# Patient Record
Sex: Male | Born: 1960 | Race: White | Hispanic: No | Smoking: Former smoker
Health system: Southern US, Community
[De-identification: ages and names within clinical notes are randomized; demographics above are authoritative.]

## PROBLEM LIST (undated history)

## (undated) DIAGNOSIS — J449 Chronic obstructive pulmonary disease, unspecified: Secondary | ICD-10-CM

## (undated) DIAGNOSIS — I2510 Atherosclerotic heart disease of native coronary artery without angina pectoris: Secondary | ICD-10-CM

## (undated) DIAGNOSIS — I739 Peripheral vascular disease, unspecified: Secondary | ICD-10-CM

## (undated) DIAGNOSIS — I2119 ST elevation (STEMI) myocardial infarction involving other coronary artery of inferior wall: Secondary | ICD-10-CM

## (undated) DIAGNOSIS — I951 Orthostatic hypotension: Secondary | ICD-10-CM

## (undated) DIAGNOSIS — E785 Hyperlipidemia, unspecified: Secondary | ICD-10-CM

## (undated) DIAGNOSIS — R55 Syncope and collapse: Secondary | ICD-10-CM

## (undated) DIAGNOSIS — I10 Essential (primary) hypertension: Secondary | ICD-10-CM

## (undated) HISTORY — DX: ST elevation (STEMI) myocardial infarction involving other coronary artery of inferior wall: I21.19

## (undated) HISTORY — DX: Essential (primary) hypertension: I10

## (undated) HISTORY — DX: Hyperlipidemia, unspecified: E78.5

## (undated) HISTORY — PX: OTHER SURGICAL HISTORY: SHX169

## (undated) HISTORY — DX: Atherosclerotic heart disease of native coronary artery without angina pectoris: I25.10

## (undated) HISTORY — PX: ANKLE SURGERY: SHX546

## (undated) HISTORY — DX: Peripheral vascular disease, unspecified: I73.9

## (undated) HISTORY — PX: FOOT SURGERY: SHX648

## (undated) HISTORY — PX: PR VEIN BYPASS GRAFT,AORTO-FEM-POP: 35551

## (undated) HISTORY — PX: CORONARY ANGIOPLASTY: SHX604

## (undated) HISTORY — DX: Orthostatic hypotension: I95.1

## (undated) HISTORY — DX: Syncope and collapse: R55

## (undated) HISTORY — DX: Chronic obstructive pulmonary disease, unspecified: J44.9

---

## 2007-03-29 ENCOUNTER — Emergency Department (HOSPITAL_COMMUNITY): Admission: EM | Admit: 2007-03-29 | Discharge: 2007-03-29 | Payer: Self-pay | Admitting: *Deleted

## 2008-04-28 HISTORY — PX: CORONARY ARTERY BYPASS GRAFT: SHX141

## 2009-02-12 ENCOUNTER — Ambulatory Visit: Payer: Self-pay | Admitting: Cardiothoracic Surgery

## 2009-02-12 ENCOUNTER — Encounter: Payer: Self-pay | Admitting: Cardiothoracic Surgery

## 2009-02-12 ENCOUNTER — Inpatient Hospital Stay (HOSPITAL_COMMUNITY): Admission: EM | Admit: 2009-02-12 | Discharge: 2009-02-20 | Payer: Self-pay | Admitting: Emergency Medicine

## 2009-03-15 ENCOUNTER — Encounter: Admission: RE | Admit: 2009-03-15 | Discharge: 2009-03-15 | Payer: Self-pay | Admitting: Cardiothoracic Surgery

## 2009-03-15 ENCOUNTER — Ambulatory Visit: Payer: Self-pay | Admitting: Cardiothoracic Surgery

## 2009-04-28 DIAGNOSIS — I2119 ST elevation (STEMI) myocardial infarction involving other coronary artery of inferior wall: Secondary | ICD-10-CM

## 2009-04-28 HISTORY — DX: ST elevation (STEMI) myocardial infarction involving other coronary artery of inferior wall: I21.19

## 2009-05-10 ENCOUNTER — Ambulatory Visit: Payer: Self-pay | Admitting: Cardiovascular Disease

## 2009-05-10 ENCOUNTER — Inpatient Hospital Stay (HOSPITAL_COMMUNITY): Admission: EM | Admit: 2009-05-10 | Discharge: 2009-05-14 | Payer: Self-pay | Admitting: Emergency Medicine

## 2009-10-18 ENCOUNTER — Inpatient Hospital Stay (HOSPITAL_COMMUNITY): Admission: EM | Admit: 2009-10-18 | Discharge: 2009-10-19 | Payer: Self-pay | Admitting: Emergency Medicine

## 2009-10-24 ENCOUNTER — Ambulatory Visit: Payer: Self-pay | Admitting: Vascular Surgery

## 2009-10-26 ENCOUNTER — Ambulatory Visit (HOSPITAL_COMMUNITY): Admission: RE | Admit: 2009-10-26 | Discharge: 2009-10-26 | Payer: Self-pay | Admitting: Vascular Surgery

## 2009-10-26 ENCOUNTER — Ambulatory Visit: Payer: Self-pay | Admitting: Vascular Surgery

## 2009-12-05 ENCOUNTER — Ambulatory Visit: Payer: Self-pay | Admitting: Cardiovascular Disease

## 2010-03-13 ENCOUNTER — Telehealth (INDEPENDENT_AMBULATORY_CARE_PROVIDER_SITE_OTHER): Payer: Self-pay | Admitting: *Deleted

## 2010-03-29 ENCOUNTER — Ambulatory Visit (HOSPITAL_COMMUNITY)
Admission: RE | Admit: 2010-03-29 | Discharge: 2010-03-30 | Payer: Self-pay | Source: Home / Self Care | Admitting: Orthopedic Surgery

## 2010-03-29 ENCOUNTER — Ambulatory Visit (HOSPITAL_BASED_OUTPATIENT_CLINIC_OR_DEPARTMENT_OTHER): Admission: RE | Admit: 2010-03-29 | Payer: Self-pay | Admitting: Orthopedic Surgery

## 2010-05-19 IMAGING — CR DG CHEST 2V
2 series · 2 of 2 positions shown · non-contrast
Comparison: 02/17/2009

CLINICAL DATA: Post CABG/short of breath

CHEST - 2 VIEW

[view not recorded (1 of 2)]
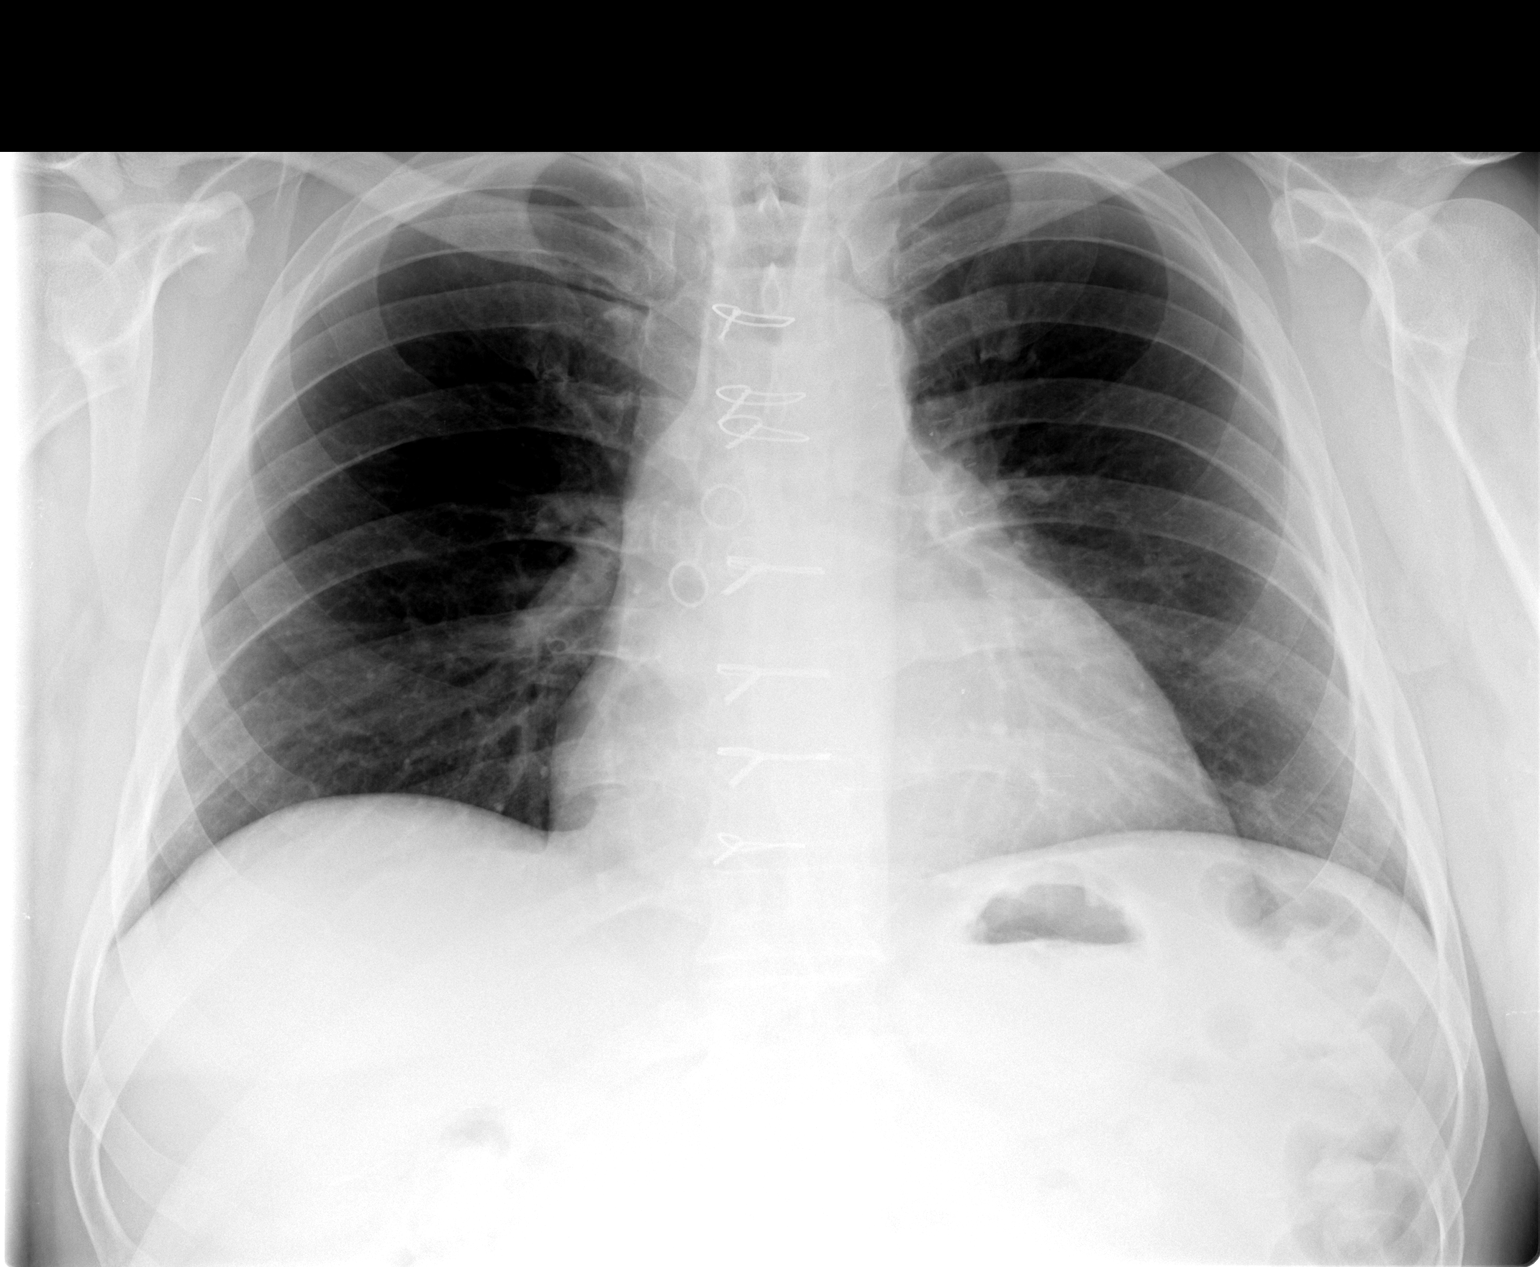

[view not recorded (2 of 2)]
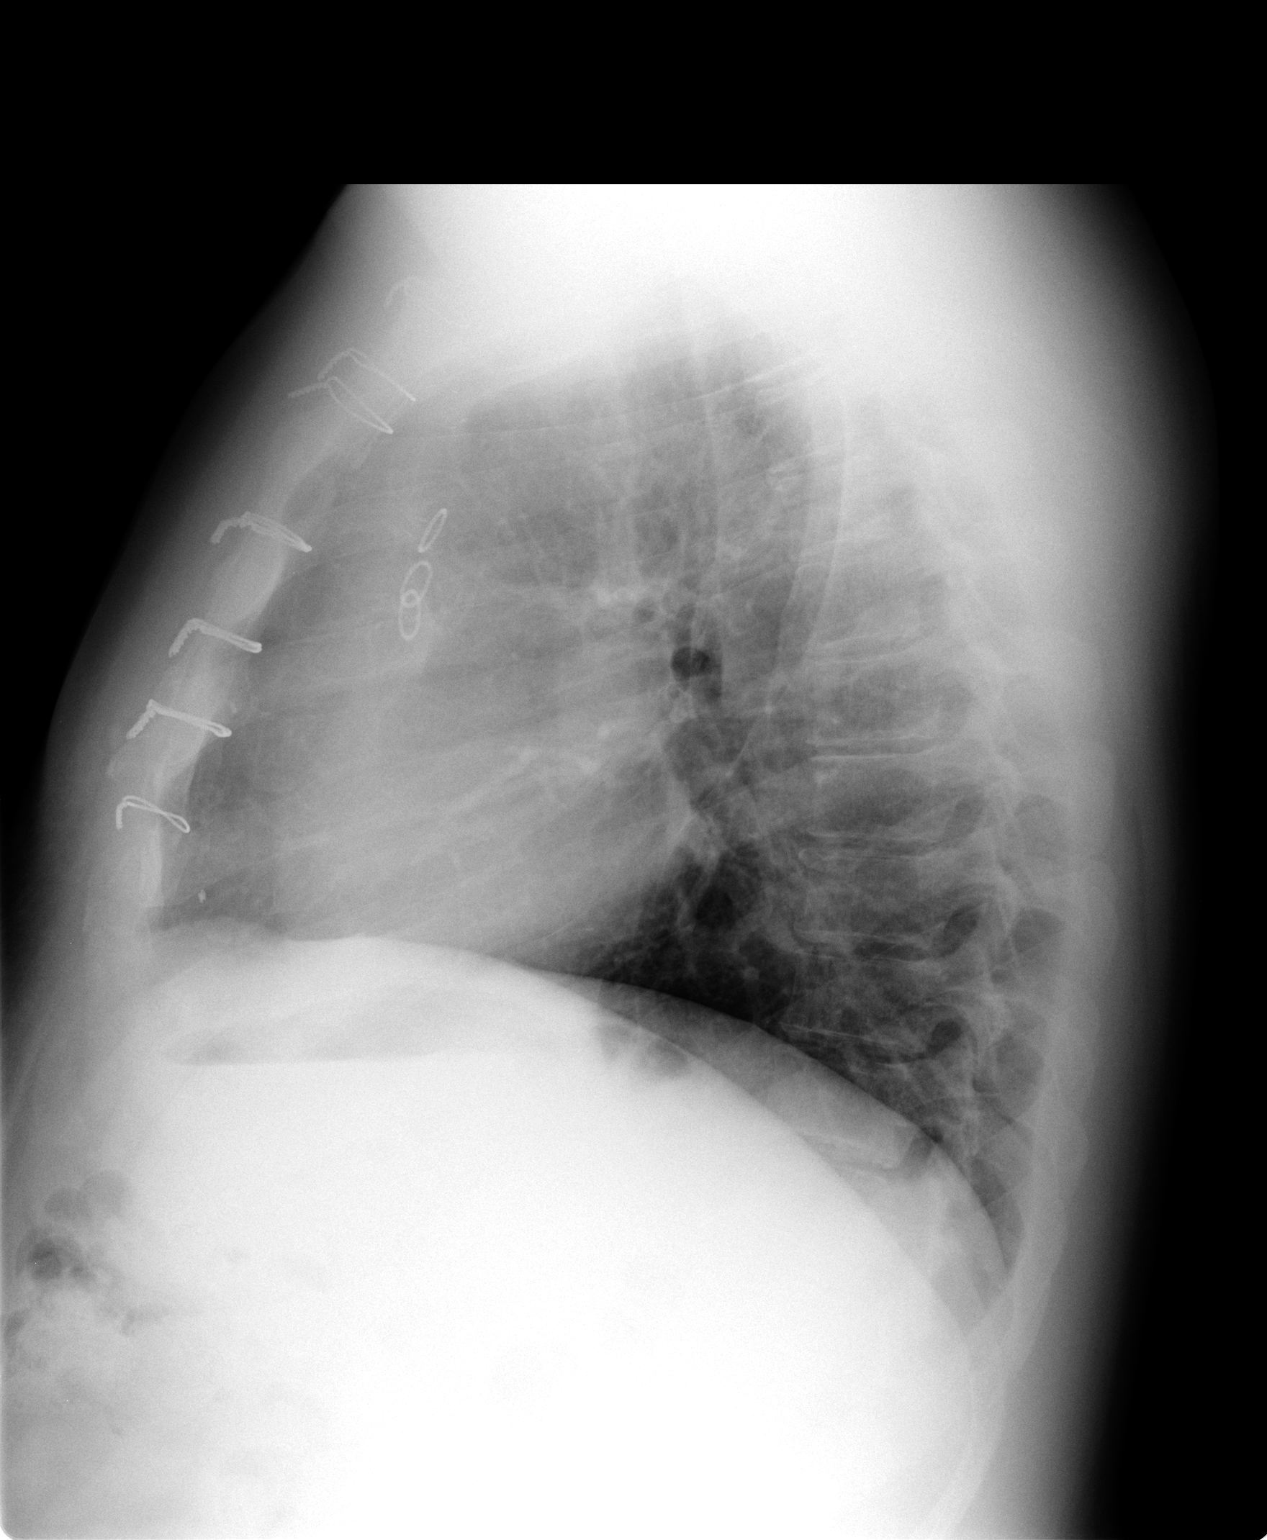

[2 of 2 positions shown; findings below may reference images not displayed]

FINDINGS: Heart size upper normal.  No current congestive heart
failure, pleural fluid, or active airspace disease.  Osseous
structures intact.
IMPRESSION: Post CABG - heart size upper normal.  No active disease.

## 2010-05-30 NOTE — Progress Notes (Signed)
Summary: Records Request  Faxed Discharge Summary & Cath. to Harriett Rush at [facility] ([phone]). Debby Freiberg  March 13, 2010 4:27 PM

## 2010-07-09 LAB — CBC
HCT: 46.7 % (ref 39.0–52.0)
Hemoglobin: 15.7 g/dL (ref 13.0–17.0)
MCH: 28.8 pg (ref 26.0–34.0)
MCHC: 33.6 g/dL (ref 30.0–36.0)
MCV: 85.7 fL (ref 78.0–100.0)
Platelets: 194 10*3/uL (ref 150–400)
RBC: 5.45 MIL/uL (ref 4.22–5.81)
RDW: 13.6 % (ref 11.5–15.5)
WBC: 8.1 10*3/uL (ref 4.0–10.5)

## 2010-07-09 LAB — BASIC METABOLIC PANEL
BUN: 8 mg/dL (ref 6–23)
CO2: 30 mEq/L (ref 19–32)
Calcium: 10 mg/dL (ref 8.4–10.5)
Chloride: 104 mEq/L (ref 96–112)
Creatinine, Ser: 1.26 mg/dL (ref 0.4–1.5)
GFR calc Af Amer: >60 mL/min (ref >60)
GFR calc non Af Amer: >60 mL/min (ref >60)
Glucose, Bld: 103 mg/dL — ABNORMAL HIGH (ref 70–99)
Potassium: 5.3 mEq/L — ABNORMAL HIGH (ref 3.5–5.1)
Sodium: 146 mEq/L — ABNORMAL HIGH (ref 135–145)

## 2010-07-09 LAB — SURGICAL PCR SCREEN
MRSA, PCR: NEGATIVE
Staphylococcus aureus: NEGATIVE

## 2010-07-14 LAB — COMPREHENSIVE METABOLIC PANEL
ALT: 29 U/L (ref 0–53)
ALT: 20 U/L (ref 0–53)
AST: 32 U/L (ref 0–37)
AST: 26 U/L (ref 0–37)
Albumin: 4.3 g/dL (ref 3.5–5.2)
Albumin: 4 g/dL (ref 3.5–5.2)
Alkaline Phosphatase: 73 U/L (ref 39–117)
Alkaline Phosphatase: 70 U/L (ref 39–117)
BUN: 19 mg/dL (ref 6–23)
BUN: 21 mg/dL (ref 6–23)
CO2: 25 mEq/L (ref 19–32)
CO2: 24 mEq/L (ref 19–32)
Calcium: 9.4 mg/dL (ref 8.4–10.5)
Calcium: 9 mg/dL (ref 8.4–10.5)
Chloride: 101 mEq/L (ref 96–112)
Chloride: 108 mEq/L (ref 96–112)
Creatinine, Ser: 1.27 mg/dL (ref 0.4–1.5)
Creatinine, Ser: 1.39 mg/dL (ref 0.4–1.5)
GFR calc Af Amer: >60 mL/min (ref >60)
GFR calc Af Amer: >60 mL/min (ref >60)
GFR calc non Af Amer: >60 mL/min (ref >60)
GFR calc non Af Amer: 55 mL/min — ABNORMAL LOW (ref >60)
Glucose, Bld: 167 mg/dL — ABNORMAL HIGH (ref 70–99)
Glucose, Bld: 102 mg/dL — ABNORMAL HIGH (ref 70–99)
Potassium: 3.9 mEq/L (ref 3.5–5.1)
Potassium: 3.9 mEq/L (ref 3.5–5.1)
Sodium: 137 mEq/L (ref 135–145)
Sodium: 140 mEq/L (ref 135–145)
Total Bilirubin: 0.5 mg/dL (ref 0.3–1.2)
Total Bilirubin: 0.7 mg/dL (ref 0.3–1.2)
Total Protein: 7.5 g/dL (ref 6.0–8.3)
Total Protein: 6.7 g/dL (ref 6.0–8.3)

## 2010-07-14 LAB — CBC
HCT: 37.5 % — ABNORMAL LOW (ref 39.0–52.0)
HCT: 46 % (ref 39.0–52.0)
HCT: 41.3 % (ref 39.0–52.0)
HCT: 39.1 % (ref 39.0–52.0)
HCT: 39.9 % (ref 39.0–52.0)
HCT: 43.7 % (ref 39.0–52.0)
Hemoglobin: 13 g/dL (ref 13.0–17.0)
Hemoglobin: 15.6 g/dL (ref 13.0–17.0)
Hemoglobin: 14.2 g/dL (ref 13.0–17.0)
Hemoglobin: 13.2 g/dL (ref 13.0–17.0)
Hemoglobin: 13.6 g/dL (ref 13.0–17.0)
Hemoglobin: 14.7 g/dL (ref 13.0–17.0)
MCH: 29 pg (ref 26.0–34.0)
MCH: 29.4 pg (ref 26.0–34.0)
MCH: 29.5 pg (ref 26.0–34.0)
MCHC: 33.8 g/dL (ref 30.0–36.0)
MCHC: 34.7 g/dL (ref 30.0–36.0)
MCHC: 34.5 g/dL (ref 30.0–36.0)
MCHC: 33.5 g/dL (ref 30.0–36.0)
MCHC: 33.8 g/dL (ref 30.0–36.0)
MCHC: 33.9 g/dL (ref 30.0–36.0)
MCV: 85.6 fL (ref 78.0–100.0)
MCV: 86.3 fL (ref 78.0–100.0)
MCV: 85.3 fL (ref 78.0–100.0)
MCV: 86.3 fL (ref 78.0–100.0)
MCV: 86.9 fL (ref 78.0–100.0)
MCV: 87 fL (ref 78.0–100.0)
Platelets: 159 10*3/uL (ref 150–400)
Platelets: 190 10*3/uL (ref 150–400)
Platelets: 163 10*3/uL (ref 150–400)
Platelets: 176 10*3/uL (ref 150–400)
Platelets: 181 10*3/uL (ref 150–400)
Platelets: 210 10*3/uL (ref 150–400)
RBC: 4.39 MIL/uL (ref 4.22–5.81)
RBC: 5.32 MIL/uL (ref 4.22–5.81)
RBC: 4.84 MIL/uL (ref 4.22–5.81)
RBC: 4.5 MIL/uL (ref 4.22–5.81)
RBC: 4.6 MIL/uL (ref 4.22–5.81)
RBC: 5.07 MIL/uL (ref 4.22–5.81)
RDW: 13.5 % (ref 11.5–15.5)
RDW: 13.8 % (ref 11.5–15.5)
RDW: 13.5 % (ref 11.5–15.5)
RDW: 13.5 % (ref 11.5–15.5)
RDW: 13.7 % (ref 11.5–15.5)
RDW: 13.9 % (ref 11.5–15.5)
WBC: 7.6 10*3/uL (ref 4.0–10.5)
WBC: 9.1 10*3/uL (ref 4.0–10.5)
WBC: 7.1 10*3/uL (ref 4.0–10.5)
WBC: 5.8 10*3/uL (ref 4.0–10.5)
WBC: 6.2 10*3/uL (ref 4.0–10.5)
WBC: 7.8 10*3/uL (ref 4.0–10.5)

## 2010-07-14 LAB — POCT I-STAT, CHEM 8
BUN: 26 mg/dL — ABNORMAL HIGH (ref 6–23)
BUN: 19 mg/dL (ref 6–23)
BUN: 24 mg/dL — ABNORMAL HIGH (ref 6–23)
Calcium, Ion: 0.99 mmol/L — ABNORMAL LOW (ref 1.12–1.32)
Calcium, Ion: 1.14 mmol/L (ref 1.12–1.32)
Calcium, Ion: 1.14 mmol/L (ref 1.12–1.32)
Chloride: 111 mEq/L (ref 96–112)
Chloride: 107 mEq/L (ref 96–112)
Chloride: 106 mEq/L (ref 96–112)
Creatinine, Ser: 1.6 mg/dL — ABNORMAL HIGH (ref 0.4–1.5)
Creatinine, Ser: 1.3 mg/dL (ref 0.4–1.5)
Creatinine, Ser: 1.8 mg/dL — ABNORMAL HIGH (ref 0.4–1.5)
Glucose, Bld: 109 mg/dL — ABNORMAL HIGH (ref 70–99)
Glucose, Bld: 171 mg/dL — ABNORMAL HIGH (ref 70–99)
Glucose, Bld: 136 mg/dL — ABNORMAL HIGH (ref 70–99)
HCT: 44 % (ref 39.0–52.0)
HCT: 48 % (ref 39.0–52.0)
HCT: 47 % (ref 39.0–52.0)
Hemoglobin: 15 g/dL (ref 13.0–17.0)
Hemoglobin: 16.3 g/dL (ref 13.0–17.0)
Hemoglobin: 16 g/dL (ref 13.0–17.0)
Potassium: 4.1 mEq/L (ref 3.5–5.1)
Potassium: 4.1 mEq/L (ref 3.5–5.1)
Potassium: 3.5 mEq/L (ref 3.5–5.1)
Sodium: 139 mEq/L (ref 135–145)
Sodium: 141 mEq/L (ref 135–145)
Sodium: 142 mEq/L (ref 135–145)
TCO2: 18 mmol/L (ref 0–100)
TCO2: 29 mmol/L (ref 0–100)
TCO2: 26 mmol/L (ref 0–100)

## 2010-07-14 LAB — BASIC METABOLIC PANEL
BUN: 13 mg/dL (ref 6–23)
BUN: 11 mg/dL (ref 6–23)
BUN: 13 mg/dL (ref 6–23)
BUN: 20 mg/dL (ref 6–23)
CO2: 26 mEq/L (ref 19–32)
CO2: 25 mEq/L (ref 19–32)
CO2: 28 mEq/L (ref 19–32)
CO2: 26 mEq/L (ref 19–32)
Calcium: 9.5 mg/dL (ref 8.4–10.5)
Calcium: 8.6 mg/dL (ref 8.4–10.5)
Calcium: 9.1 mg/dL (ref 8.4–10.5)
Calcium: 8.9 mg/dL (ref 8.4–10.5)
Chloride: 106 mEq/L (ref 96–112)
Chloride: 103 mEq/L (ref 96–112)
Chloride: 104 mEq/L (ref 96–112)
Chloride: 106 mEq/L (ref 96–112)
Creatinine, Ser: 1.11 mg/dL (ref 0.4–1.5)
Creatinine, Ser: 1.16 mg/dL (ref 0.4–1.5)
Creatinine, Ser: 1.23 mg/dL (ref 0.4–1.5)
Creatinine, Ser: 1.43 mg/dL (ref 0.4–1.5)
GFR calc Af Amer: >60 mL/min (ref >60)
GFR calc Af Amer: >60 mL/min (ref >60)
GFR calc Af Amer: >60 mL/min (ref >60)
GFR calc Af Amer: >60 mL/min (ref >60)
GFR calc non Af Amer: >60 mL/min (ref >60)
GFR calc non Af Amer: >60 mL/min (ref >60)
GFR calc non Af Amer: >60 mL/min (ref >60)
GFR calc non Af Amer: 53 mL/min — ABNORMAL LOW (ref >60)
Glucose, Bld: 122 mg/dL — ABNORMAL HIGH (ref 70–99)
Glucose, Bld: 96 mg/dL (ref 70–99)
Glucose, Bld: 98 mg/dL (ref 70–99)
Glucose, Bld: 102 mg/dL — ABNORMAL HIGH (ref 70–99)
Potassium: 3.8 mEq/L (ref 3.5–5.1)
Potassium: 3.6 mEq/L (ref 3.5–5.1)
Potassium: 3.9 mEq/L (ref 3.5–5.1)
Potassium: 3.9 mEq/L (ref 3.5–5.1)
Sodium: 142 mEq/L (ref 135–145)
Sodium: 138 mEq/L (ref 135–145)
Sodium: 138 mEq/L (ref 135–145)
Sodium: 143 mEq/L (ref 135–145)

## 2010-07-14 LAB — GLUCOSE, CAPILLARY
Glucose-Capillary: 120 mg/dL — ABNORMAL HIGH (ref 70–99)
Glucose-Capillary: 125 mg/dL — ABNORMAL HIGH (ref 70–99)

## 2010-07-14 LAB — DIFFERENTIAL
Basophils Absolute: 0 10*3/uL (ref 0.0–0.1)
Basophils Relative: 0 % (ref 0–1)
Eosinophils Absolute: 0.3 10*3/uL (ref 0.0–0.7)
Eosinophils Relative: 4 % (ref 0–5)
Lymphocytes Relative: 22 % (ref 12–46)
Lymphs Abs: 1.7 10*3/uL (ref 0.7–4.0)
Monocytes Absolute: 0.7 10*3/uL (ref 0.1–1.0)
Monocytes Relative: 9 % (ref 3–12)
Neutro Abs: 4.9 10*3/uL (ref 1.7–7.7)
Neutrophils Relative %: 65 % (ref 43–77)

## 2010-07-14 LAB — BRAIN NATRIURETIC PEPTIDE
Pro B Natriuretic peptide (BNP): 186 pg/mL — ABNORMAL HIGH (ref 0.0–100.0)
Pro B Natriuretic peptide (BNP): 180 pg/mL — ABNORMAL HIGH (ref 0.0–100.0)

## 2010-07-14 LAB — CARDIAC PANEL(CRET KIN+CKTOT+MB+TROPI)
CK, MB: 2.1 ng/mL (ref 0.3–4.0)
CK, MB: 86 ng/mL (ref 0.3–4.0)
Relative Index: 2 (ref 0.0–2.5)
Relative Index: 8.6 — ABNORMAL HIGH (ref 0.0–2.5)
Total CK: 105 U/L (ref 7–232)
Total CK: 998 U/L — ABNORMAL HIGH (ref 7–232)
Troponin I: 0.04 ng/mL (ref 0.00–0.06)
Troponin I: 50.12 ng/mL (ref 0.00–0.06)

## 2010-07-14 LAB — CK TOTAL AND CKMB (NOT AT ARMC)
CK, MB: 114.6 ng/mL (ref 0.3–4.0)
CK, MB: 3.1 ng/mL (ref 0.3–4.0)
Relative Index: 9.8 — ABNORMAL HIGH (ref 0.0–2.5)
Relative Index: 2.1 (ref 0.0–2.5)
Total CK: 1172 U/L — ABNORMAL HIGH (ref 7–232)
Total CK: 149 U/L (ref 7–232)

## 2010-07-14 LAB — PROTIME-INR
INR: 0.93 (ref 0.00–1.49)
INR: 0.97 (ref 0.00–1.49)
INR: 1.03 (ref 0.00–1.49)
Prothrombin Time: 12.4 seconds (ref 11.6–15.2)
Prothrombin Time: 12.8 seconds (ref 11.6–15.2)
Prothrombin Time: 13.4 seconds (ref 11.6–15.2)

## 2010-07-14 LAB — LIPID PANEL
Cholesterol: 161 mg/dL (ref 0–200)
HDL: 30 mg/dL — ABNORMAL LOW (ref >39)
LDL Cholesterol: 109 mg/dL — ABNORMAL HIGH (ref 0–99)
Total CHOL/HDL Ratio: 5.4 RATIO
Triglycerides: 109 mg/dL (ref <150)
VLDL: 22 mg/dL (ref 0–40)

## 2010-07-14 LAB — POCT CARDIAC MARKERS
CKMB, poc: 1.3 ng/mL (ref 1.0–8.0)
CKMB, poc: 2.2 ng/mL (ref 1.0–8.0)
Myoglobin, poc: 56.2 ng/mL (ref 12–200)
Myoglobin, poc: 172 ng/mL (ref 12–200)
Troponin i, poc: <0.05 ng/mL (ref 0.00–0.09)
Troponin i, poc: <0.05 ng/mL (ref 0.00–0.09)

## 2010-07-14 LAB — MAGNESIUM: Magnesium: 2.1 mg/dL (ref 1.5–2.5)

## 2010-07-14 LAB — HEPARIN LEVEL (UNFRACTIONATED)
Heparin Unfractionated: 0.41 IU/mL (ref 0.30–0.70)
Heparin Unfractionated: 0.41 IU/mL (ref 0.30–0.70)

## 2010-07-14 LAB — TSH: TSH: 3.734 u[IU]/mL (ref 0.350–4.500)

## 2010-07-14 LAB — TROPONIN I
Troponin I: 46.08 ng/mL (ref 0.00–0.06)
Troponin I: 0.01 ng/mL (ref 0.00–0.06)

## 2010-07-14 LAB — HEMOGLOBIN A1C
Hgb A1c MFr Bld: 5.5 % (ref 4.6–6.1)
Mean Plasma Glucose: 111 mg/dL

## 2010-07-14 LAB — D-DIMER, QUANTITATIVE: D-Dimer, Quant: 0.3 ug/mL-FEU (ref 0.00–0.48)

## 2010-07-14 LAB — MRSA PCR SCREENING: MRSA by PCR: NEGATIVE

## 2010-07-16 ENCOUNTER — Encounter (HOSPITAL_BASED_OUTPATIENT_CLINIC_OR_DEPARTMENT_OTHER)
Admission: RE | Admit: 2010-07-16 | Discharge: 2010-07-16 | Disposition: A | Discharge status: Home / Self Care | Payer: Worker's Compensation | Source: Ambulatory Visit | Attending: Orthopedic Surgery | Admitting: Orthopedic Surgery

## 2010-07-16 DIAGNOSIS — Z01812 Encounter for preprocedural laboratory examination: Secondary | ICD-10-CM | POA: Insufficient documentation

## 2010-07-16 LAB — BASIC METABOLIC PANEL
BUN: 9 mg/dL (ref 6–23)
CO2: 26 mEq/L (ref 19–32)
Calcium: 8.8 mg/dL (ref 8.4–10.5)
Chloride: 107 mEq/L (ref 96–112)
Creatinine, Ser: 1.15 mg/dL (ref 0.4–1.5)
GFR calc Af Amer: >60 mL/min (ref >60)
GFR calc non Af Amer: >60 mL/min (ref >60)
Glucose, Bld: 122 mg/dL — ABNORMAL HIGH (ref 70–99)
Potassium: 4 mEq/L (ref 3.5–5.1)
Sodium: 142 mEq/L (ref 135–145)

## 2010-07-19 ENCOUNTER — Ambulatory Visit (HOSPITAL_BASED_OUTPATIENT_CLINIC_OR_DEPARTMENT_OTHER)
Admission: RE | Admit: 2010-07-19 | Discharge: 2010-07-20 | Disposition: A | Discharge status: Home / Self Care | Payer: Worker's Compensation | Source: Ambulatory Visit | Attending: Orthopedic Surgery | Admitting: Orthopedic Surgery

## 2010-07-19 DIAGNOSIS — M24873 Other specific joint derangements of unspecified ankle, not elsewhere classified: Secondary | ICD-10-CM | POA: Insufficient documentation

## 2010-07-19 DIAGNOSIS — M6240 Contracture of muscle, unspecified site: Secondary | ICD-10-CM | POA: Insufficient documentation

## 2010-07-19 DIAGNOSIS — M722 Plantar fascial fibromatosis: Secondary | ICD-10-CM | POA: Insufficient documentation

## 2010-07-19 DIAGNOSIS — Z01812 Encounter for preprocedural laboratory examination: Secondary | ICD-10-CM | POA: Insufficient documentation

## 2010-07-19 LAB — POCT HEMOGLOBIN-HEMACUE: Hemoglobin: 14.5 g/dL (ref 13.0–17.0)

## 2010-07-23 NOTE — Op Note (Signed)
NAME:  [name], [name] NO.:  [id number]  MEDICAL RECORD NO.:  [id number]           PATIENT TYPE:  LOCATION:                                 FACILITY:  PHYSICIAN:  Toni Arthurs, MD        DATE OF BIRTH:  01-04-1961  DATE OF PROCEDURE:  07/19/2010 DATE OF DISCHARGE:                              OPERATIVE REPORT   PREOPERATIVE DIAGNOSES: 1. Left recurrent chronic ankle instability. 2. Chronic plantar fasciitis.  POSTOPERATIVE DIAGNOSES: 1. Left recurrent chronic ankle instability. 2. Chronic plantar fasciitis. 3. Left gastrocnemius contracture.  PROCEDURE: 1. Left gastrocnemius recession. 2. Left peroneus longus tenolysis. 3. Left peroneus brevis tenolysis. 4. Left partial calcanectomy (peroneal tubercle). 5. Left lateral ankle ligament reconstruction with allograft     semitendinosus tendon. 6. Intraoperative interpretation of fluoroscopic imaging.  SURGEON:  Toni Arthurs, MD  ANESTHESIA:  General, regional.IV FLUIDS:  See anesthesia record.  ESTIMATED BLOOD LOSS:  Minimal.  TOURNIQUET TIME:  1 hour 50 minutes at 250 mmHg.  COMPLICATIONS:  None apparent.  DISPOSITION:  Extubated awake and stable to recovery.  INDICATIONS FOR PROCEDURE:  The patient is a 50 year old male with past medical history significant for coronary artery disease who sustained an inversion injury to his ankle at work approximately a year ago.  He failed conservative treatment of this and ended up having lateral ankle ligament reconstruction and peroneal tendon repair.  In the postoperative period, he sustained another inversion injury to the left ankle and has had recurrent chronic ankle instability.  He has evidence of peroneal tendon involvement again as well.  He has failed all efforts at conservative treatments including extensive bracing and physical therapy.  He presents now for exploration of his peroneal tendons with possible repair as well as lateral ligament  reconstruction.  He also developed chronic plantar fasciitis over the past year and has a gastrocnemius contracture.  He presents as well for gastrocnemius recession.  He understands the risks and benefits of these procedures including failure to improve bleeding, infection, nerve damage, blood clots, need for additional surgery, recurrence of his instability, amputation, and death.  He elects to proceed.  PROCEDURE IN DETAIL:  After preoperative consent was obtained and the correct operative site was identified, the patient was brought to the operating room and placed supine on the operating table.  General anesthesia was induced.  Preoperative antibiotics were administered. Surgical time-out was taken.  The patient was then turned into the lateral decubitus position with his left side up.  The left lower extremity was prepped and draped in standard sterile fashion.  An examination under anesthesia was carried out.  The patient was noted to have grade 1 anterior drawer in neutral and in plantar flexion as well as a grade 1 talar tilt test.  He was also found to have a gastrocnemius contracture and just reached neutral dorsiflexion with his knee extended and had approximately 30 degrees of dorsiflexion of his knee flexed. The extremity was exsanguinated and the tourniquet was inflated to 250 mmHg.  A longitudinal incision was marked in the midline of posterior aspect of his calf just  distal to the gastrocnemius.  Sharp dissection was carried down through the skin.  Blunt dissection was carried down to the level of the superficial fascia.  The sural nerve was identified and was retracted and protected through the case.  The superficial fascia was incised.  The gastrocnemius tendon was isolated and divided under direct vision.  At this point, the ankle would easily dorsiflex to 30 degrees with the knee extended.  The wound was irrigated copiously. Inverted simple sutures of 2-0 Vicryl were  used to close the subcutaneous tissue and a running 3-0 Prolene suture was used to close the skin.  Attention was then turned to the patient's previous lateral ankle incision.  This incision was made.  Sharp dissection was carried down through the skin.  Blunt dissection was then carried down to the superficial level of the peroneal tendon sheath.  Sheath was incised leaving a cuff of tissue at the posterior aspect of the fibula.  The peroneus longus tendon was noted to have moderate tenosynovitis, but no evidence of tear.  This was debrided sharply.  The peroneus brevis was noted to be involved with extensive tenosynovitis, but no evidence of tear.  Again, this tenosynovitis was debrided sharply.  The peroneal tubercle was noted to be quite prominent.  This was debrided down to the level of the lateral wall of the calcaneus with a rongeur.  The calcaneofibular ligament was noted to be somewhat attenuated. The ATFL was also somewhat attenuated.  The decision was made to proceed with lateral ligament reconstruction.  An allograft semitendinosus tendon was prepared by whip stitching, the ends of the tendon with the 0 FiberWire. A guide pin was inserted through the sinus tarsi into the neck of the talus.  A 6-mm drill hole was made through the neck of the talus.  A tunnel was drilled in the distal end of the fibula with a 6-mm drill bit from the origin of the ATFL to the origin of CFL in a curved fashion creating a bone bridge at the distal aspect of the fibula.  This was smoothed with a rongeur.  The graft was then passed through the tunnel. A beef pin was used to pass the suture end down through the talar neck tunnel and out through the medial aspect of the ankle.  The tendon was pulled into the tunnel and secured with a 5.5 mm Bio-Tenodesis screw. The ankle was then positioned in maximal eversion.  The graft was tensioned maximally.  A beef pin was then inserted through the lateral wall  of the calcaneus.  Fluoroscopic images were obtained prior to drilling the talar neck hole and were again obtained prior to drilling the calcaneal hole confirming appropriate position of the guide pin in both instances.  The 5.5-mm reamer was then used to drill the hole in the calcaneus.  The graft was pulled down into the tunnel using the beef pin.  The graft was again tensioned maximally while holding the foot maximal eversion.  A 5.5 mm Bio-Tenodesis screw was inserted and noted to have excellent purchase.  At this point, the anterior drawer was noted to be grade 0 in neutral and in plantar flexion.  The wound was irrigated copiously.  The peroneal tendon sheath was repaired with imbricating sutures of 2-0 FiberWire.  The subcutaneous tissues were closed with inverted simple sutures of 2-0 Vicryl.  The skin was closed with horizontal mattress sutures of 3-0 Prolene.  Sterile dressings were applied followed by a well-padded short-leg splint with  the ankle positioned in neutral dorsiflexion and maximal eversion.  The tourniquet was released 1 hour and 50 minutes.  The patient was then awakened from anesthesia and transported to recovery room stable condition.  FOLLOWUP PLAN:  The patient will remain overnight for observation for pain control.  He will be discharged home in the morning.  He will be nonweightbearing on the left lower extremity until he follows up with me in 2 weeks.     Toni Arthurs, MD     JH/MEDQ  D:  07/19/2010  T:  07/20/2010  Job:  [id number]  Electronically Signed by Toni Arthurs  on 07/23/2010 01:38:35 PM

## 2010-07-26 ENCOUNTER — Other Ambulatory Visit: Payer: Self-pay | Admitting: *Deleted

## 2010-07-26 MED ORDER — POTASSIUM CHLORIDE ER 10 MEQ PO TBCR: 10.0000 meq | EXTENDED_RELEASE_TABLET | Freq: Every day | ORAL | Status: DC

## 2010-07-31 ENCOUNTER — Other Ambulatory Visit: Payer: Self-pay | Admitting: Cardiovascular Disease

## 2010-07-31 ENCOUNTER — Other Ambulatory Visit: Payer: Self-pay | Admitting: *Deleted

## 2010-07-31 DIAGNOSIS — I10 Essential (primary) hypertension: Secondary | ICD-10-CM

## 2010-07-31 MED ORDER — POTASSIUM CHLORIDE ER 10 MEQ PO TBCR: 10.0000 meq | EXTENDED_RELEASE_TABLET | Freq: Every day | ORAL | Status: DC

## 2010-07-31 NOTE — Telephone Encounter (Signed)
Re ordered, setting was set to print instead of fax. Alfonso Ramus RN

## 2010-07-31 NOTE — Telephone Encounter (Signed)
REFILL PER FAX FROM PHARMACY  

## 2010-08-01 LAB — CARDIAC PANEL(CRET KIN+CKTOT+MB+TROPI)
CK, MB: 11.2 ng/mL — ABNORMAL HIGH (ref 0.3–4.0)
CK, MB: 35.1 ng/mL — ABNORMAL HIGH (ref 0.3–4.0)
CK, MB: 36.9 ng/mL — ABNORMAL HIGH (ref 0.3–4.0)
Relative Index: 11 — ABNORMAL HIGH (ref 0.0–2.5)
Relative Index: 11.3 — ABNORMAL HIGH (ref 0.0–2.5)
Relative Index: 7.5 — ABNORMAL HIGH (ref 0.0–2.5)
Total CK: 150 U/L (ref 7–232)
Total CK: 311 U/L — ABNORMAL HIGH (ref 7–232)
Total CK: 335 U/L — ABNORMAL HIGH (ref 7–232)
Troponin I: 0.45 ng/mL — ABNORMAL HIGH (ref 0.00–0.06)
Troponin I: 2.08 ng/mL (ref 0.00–0.06)

## 2010-08-01 LAB — POCT I-STAT 3, ART BLOOD GAS (G3+)
Acid-Base Excess: 1 mmol/L (ref 0.0–2.0)
Acid-Base Excess: 2 mmol/L (ref 0.0–2.0)
Acid-base deficit: 1 mmol/L (ref 0.0–2.0)
Acid-base deficit: 2 mmol/L (ref 0.0–2.0)
Acid-base deficit: 2 mmol/L (ref 0.0–2.0)
Acid-base deficit: 2 mmol/L (ref 0.0–2.0)
Bicarbonate: 22.2 mEq/L (ref 20.0–24.0)
Bicarbonate: 22.9 meq/L (ref 20.0–24.0)
Bicarbonate: 23.7 mEq/L (ref 20.0–24.0)
Bicarbonate: 24.5 meq/L — ABNORMAL HIGH (ref 20.0–24.0)
Bicarbonate: 26 mEq/L — ABNORMAL HIGH (ref 20.0–24.0)
Bicarbonate: 26.2 meq/L — ABNORMAL HIGH (ref 20.0–24.0)
Bicarbonate: 27.9 meq/L — ABNORMAL HIGH (ref 20.0–24.0)
O2 Saturation: 100 %
O2 Saturation: 100 %
O2 Saturation: 100 %
O2 Saturation: 100 %
O2 Saturation: 94 %
O2 Saturation: 94 %
O2 Saturation: 96 %
Patient temperature: 36.9
Patient temperature: 38.2
Patient temperature: 98.2
TCO2: 23 mmol/L (ref 0–100)
TCO2: 24 mmol/L (ref 0–100)
TCO2: 25 mmol/L (ref 0–100)
TCO2: 26 mmol/L (ref 0–100)
TCO2: 27 mmol/L (ref 0–100)
TCO2: 27 mmol/L (ref 0–100)
TCO2: 29 mmol/L (ref 0–100)
pCO2 arterial: 34.8 mmHg — ABNORMAL LOW (ref 35.0–45.0)
pCO2 arterial: 39.3 mmHg (ref 35.0–45.0)
pCO2 arterial: 41.6 mmHg (ref 35.0–45.0)
pCO2 arterial: 42.2 mmHg (ref 35.0–45.0)
pCO2 arterial: 48 mmHg — ABNORMAL HIGH (ref 35.0–45.0)
pCO2 arterial: 48 mmHg — ABNORMAL HIGH (ref 35.0–45.0)
pCO2 arterial: 48.5 mmHg — ABNORMAL HIGH (ref 35.0–45.0)
pH, Arterial: 7.312 — ABNORMAL LOW (ref 7.350–7.450)
pH, Arterial: 7.341 — ABNORMAL LOW (ref 7.350–7.450)
pH, Arterial: 7.357 (ref 7.350–7.450)
pH, Arterial: 7.366 (ref 7.350–7.450)
pH, Arterial: 7.372 (ref 7.350–7.450)
pH, Arterial: 7.407 (ref 7.350–7.450)
pH, Arterial: 7.425 (ref 7.350–7.450)
pO2, Arterial: 242 mmHg — ABNORMAL HIGH (ref 80.0–100.0)
pO2, Arterial: 252 mmHg — ABNORMAL HIGH (ref 80.0–100.0)
pO2, Arterial: 255 mmHg — ABNORMAL HIGH (ref 80.0–100.0)
pO2, Arterial: 480 mmHg — ABNORMAL HIGH (ref 80.0–100.0)
pO2, Arterial: 77 mmHg — ABNORMAL LOW (ref 80.0–100.0)
pO2, Arterial: 77 mmHg — ABNORMAL LOW (ref 80.0–100.0)
pO2, Arterial: 81 mmHg (ref 80.0–100.0)

## 2010-08-01 LAB — BASIC METABOLIC PANEL
BUN: 10 mg/dL (ref 6–23)
BUN: 15 mg/dL (ref 6–23)
BUN: 18 mg/dL (ref 6–23)
BUN: 20 mg/dL (ref 6–23)
BUN: 22 mg/dL (ref 6–23)
BUN: 23 mg/dL (ref 6–23)
CO2: 21 mEq/L (ref 19–32)
CO2: 24 mEq/L (ref 19–32)
CO2: 25 mEq/L (ref 19–32)
CO2: 27 mEq/L (ref 19–32)
CO2: 27 mEq/L (ref 19–32)
CO2: 27 mEq/L (ref 19–32)
Calcium: 7.4 mg/dL — ABNORMAL LOW (ref 8.4–10.5)
Calcium: 7.4 mg/dL — ABNORMAL LOW (ref 8.4–10.5)
Calcium: 7.4 mg/dL — ABNORMAL LOW (ref 8.4–10.5)
Calcium: 8.2 mg/dL — ABNORMAL LOW (ref 8.4–10.5)
Calcium: 8.3 mg/dL — ABNORMAL LOW (ref 8.4–10.5)
Calcium: 8.4 mg/dL (ref 8.4–10.5)
Chloride: 101 mEq/L (ref 96–112)
Chloride: 103 mEq/L (ref 96–112)
Chloride: 107 mEq/L (ref 96–112)
Chloride: 107 mEq/L (ref 96–112)
Chloride: 108 mEq/L (ref 96–112)
Chloride: 109 mEq/L (ref 96–112)
Creatinine, Ser: 1.02 mg/dL (ref 0.4–1.5)
Creatinine, Ser: 1.03 mg/dL (ref 0.4–1.5)
Creatinine, Ser: 1.05 mg/dL (ref 0.4–1.5)
Creatinine, Ser: 1.09 mg/dL (ref 0.4–1.5)
Creatinine, Ser: 1.12 mg/dL (ref 0.4–1.5)
Creatinine, Ser: 1.55 mg/dL — ABNORMAL HIGH (ref 0.4–1.5)
GFR calc Af Amer: 59 mL/min — ABNORMAL LOW (ref >60)
GFR calc Af Amer: >60 mL/min (ref >60)
GFR calc Af Amer: >60 mL/min (ref >60)
GFR calc Af Amer: >60 mL/min (ref >60)
GFR calc Af Amer: >60 mL/min (ref >60)
GFR calc Af Amer: >60 mL/min (ref >60)
GFR calc non Af Amer: 49 mL/min — ABNORMAL LOW (ref >60)
GFR calc non Af Amer: >60 mL/min (ref >60)
GFR calc non Af Amer: >60 mL/min (ref >60)
GFR calc non Af Amer: >60 mL/min (ref >60)
GFR calc non Af Amer: >60 mL/min (ref >60)
GFR calc non Af Amer: >60 mL/min (ref >60)
Glucose, Bld: 100 mg/dL — ABNORMAL HIGH (ref 70–99)
Glucose, Bld: 112 mg/dL — ABNORMAL HIGH (ref 70–99)
Glucose, Bld: 114 mg/dL — ABNORMAL HIGH (ref 70–99)
Glucose, Bld: 169 mg/dL — ABNORMAL HIGH (ref 70–99)
Glucose, Bld: 98 mg/dL (ref 70–99)
Glucose, Bld: 99 mg/dL (ref 70–99)
Potassium: 3.6 mEq/L (ref 3.5–5.1)
Potassium: 3.6 mEq/L (ref 3.5–5.1)
Potassium: 3.9 mEq/L (ref 3.5–5.1)
Potassium: 4 mEq/L (ref 3.5–5.1)
Potassium: 4 mEq/L (ref 3.5–5.1)
Potassium: 4.1 mEq/L (ref 3.5–5.1)
Sodium: 137 mEq/L (ref 135–145)
Sodium: 137 mEq/L (ref 135–145)
Sodium: 138 mEq/L (ref 135–145)
Sodium: 142 mEq/L (ref 135–145)
Sodium: 142 mEq/L (ref 135–145)
Sodium: 142 mEq/L (ref 135–145)

## 2010-08-01 LAB — POCT I-STAT 4, (NA,K, GLUC, HGB,HCT)
Glucose, Bld: 101 mg/dL — ABNORMAL HIGH (ref 70–99)
Glucose, Bld: 104 mg/dL — ABNORMAL HIGH (ref 70–99)
Glucose, Bld: 107 mg/dL — ABNORMAL HIGH (ref 70–99)
Glucose, Bld: 113 mg/dL — ABNORMAL HIGH (ref 70–99)
Glucose, Bld: 119 mg/dL — ABNORMAL HIGH (ref 70–99)
Glucose, Bld: 136 mg/dL — ABNORMAL HIGH (ref 70–99)
Glucose, Bld: 158 mg/dL — ABNORMAL HIGH (ref 70–99)
Glucose, Bld: 90 mg/dL (ref 70–99)
Glucose, Bld: 93 mg/dL (ref 70–99)
HCT: 30 % — ABNORMAL LOW (ref 39.0–52.0)
HCT: 30 % — ABNORMAL LOW (ref 39.0–52.0)
HCT: 31 % — ABNORMAL LOW (ref 39.0–52.0)
HCT: 31 % — ABNORMAL LOW (ref 39.0–52.0)
HCT: 33 % — ABNORMAL LOW (ref 39.0–52.0)
HCT: 33 % — ABNORMAL LOW (ref 39.0–52.0)
HCT: 39 % (ref 39.0–52.0)
HCT: 41 % (ref 39.0–52.0)
HCT: 42 % (ref 39.0–52.0)
Hemoglobin: 10.2 g/dL — ABNORMAL LOW (ref 13.0–17.0)
Hemoglobin: 10.2 g/dL — ABNORMAL LOW (ref 13.0–17.0)
Hemoglobin: 10.5 g/dL — ABNORMAL LOW (ref 13.0–17.0)
Hemoglobin: 10.5 g/dL — ABNORMAL LOW (ref 13.0–17.0)
Hemoglobin: 11.2 g/dL — ABNORMAL LOW (ref 13.0–17.0)
Hemoglobin: 11.2 g/dL — ABNORMAL LOW (ref 13.0–17.0)
Hemoglobin: 13.3 g/dL (ref 13.0–17.0)
Hemoglobin: 13.9 g/dL (ref 13.0–17.0)
Hemoglobin: 14.3 g/dL (ref 13.0–17.0)
Potassium: 3.6 mEq/L (ref 3.5–5.1)
Potassium: 3.6 mEq/L (ref 3.5–5.1)
Potassium: 3.7 mEq/L (ref 3.5–5.1)
Potassium: 3.7 meq/L (ref 3.5–5.1)
Potassium: 3.8 meq/L (ref 3.5–5.1)
Potassium: 3.9 meq/L (ref 3.5–5.1)
Potassium: 4.4 mEq/L (ref 3.5–5.1)
Potassium: 4.9 mEq/L (ref 3.5–5.1)
Potassium: 4.9 mEq/L (ref 3.5–5.1)
Sodium: 134 meq/L — ABNORMAL LOW (ref 135–145)
Sodium: 135 mEq/L (ref 135–145)
Sodium: 137 mEq/L (ref 135–145)
Sodium: 137 mEq/L (ref 135–145)
Sodium: 137 mEq/L (ref 135–145)
Sodium: 140 mEq/L (ref 135–145)
Sodium: 140 meq/L (ref 135–145)
Sodium: 141 mEq/L (ref 135–145)
Sodium: 142 meq/L (ref 135–145)

## 2010-08-01 LAB — DIFFERENTIAL
Basophils Absolute: 0.1 10*3/uL (ref 0.0–0.1)
Basophils Relative: 1 % (ref 0–1)
Eosinophils Absolute: 0.2 10*3/uL (ref 0.0–0.7)
Eosinophils Relative: 2 % (ref 0–5)
Lymphocytes Relative: 24 % (ref 12–46)
Lymphs Abs: 2.5 10*3/uL (ref 0.7–4.0)
Monocytes Absolute: 0.7 10*3/uL (ref 0.1–1.0)
Monocytes Relative: 7 % (ref 3–12)
Neutro Abs: 6.7 10*3/uL (ref 1.7–7.7)
Neutrophils Relative %: 66 % (ref 43–77)

## 2010-08-01 LAB — CROSSMATCH
ABO/RH(D): A NEG
Antibody Screen: NEGATIVE

## 2010-08-01 LAB — COMPREHENSIVE METABOLIC PANEL WITH GFR
ALT: 15 U/L (ref 0–53)
AST: 35 U/L (ref 0–37)
Albumin: 3.4 g/dL — ABNORMAL LOW (ref 3.5–5.2)
Alkaline Phosphatase: 62 U/L (ref 39–117)
BUN: 7 mg/dL (ref 6–23)
CO2: 25 meq/L (ref 19–32)
Calcium: 8.6 mg/dL (ref 8.4–10.5)
Chloride: 110 meq/L (ref 96–112)
Creatinine, Ser: 1.04 mg/dL (ref 0.4–1.5)
GFR calc non Af Amer: >60 mL/min
Glucose, Bld: 107 mg/dL — ABNORMAL HIGH (ref 70–99)
Potassium: 3.3 meq/L — ABNORMAL LOW (ref 3.5–5.1)
Sodium: 143 meq/L (ref 135–145)
Total Bilirubin: 0.8 mg/dL (ref 0.3–1.2)
Total Protein: 5.9 g/dL — ABNORMAL LOW (ref 6.0–8.3)

## 2010-08-01 LAB — POCT I-STAT, CHEM 8
BUN: 18 mg/dL (ref 6–23)
BUN: 7 mg/dL (ref 6–23)
BUN: 9 mg/dL (ref 6–23)
Calcium, Ion: 1.03 mmol/L — ABNORMAL LOW (ref 1.12–1.32)
Calcium, Ion: 1.06 mmol/L — ABNORMAL LOW (ref 1.12–1.32)
Calcium, Ion: 1.08 mmol/L — ABNORMAL LOW (ref 1.12–1.32)
Chloride: 106 mEq/L (ref 96–112)
Chloride: 106 mEq/L (ref 96–112)
Chloride: 109 meq/L (ref 96–112)
Creatinine, Ser: 1.1 mg/dL (ref 0.4–1.5)
Creatinine, Ser: 1.3 mg/dL (ref 0.4–1.5)
Creatinine, Ser: 1.4 mg/dL (ref 0.4–1.5)
Glucose, Bld: 120 mg/dL — ABNORMAL HIGH (ref 70–99)
Glucose, Bld: 132 mg/dL — ABNORMAL HIGH (ref 70–99)
Glucose, Bld: 156 mg/dL — ABNORMAL HIGH (ref 70–99)
HCT: 39 % (ref 39.0–52.0)
HCT: 42 % (ref 39.0–52.0)
HCT: 53 % — ABNORMAL HIGH (ref 39.0–52.0)
Hemoglobin: 13.3 g/dL (ref 13.0–17.0)
Hemoglobin: 14.3 g/dL (ref 13.0–17.0)
Hemoglobin: 18 g/dL — ABNORMAL HIGH (ref 13.0–17.0)
Potassium: 3.6 mEq/L (ref 3.5–5.1)
Potassium: 4 mEq/L (ref 3.5–5.1)
Potassium: 4.2 meq/L (ref 3.5–5.1)
Sodium: 138 mEq/L (ref 135–145)
Sodium: 140 mEq/L (ref 135–145)
Sodium: 141 meq/L (ref 135–145)
TCO2: 20 mmol/L (ref 0–100)
TCO2: 21 mmol/L (ref 0–100)
TCO2: 26 mmol/L (ref 0–100)

## 2010-08-01 LAB — CBC
HCT: 31 % — ABNORMAL LOW (ref 39.0–52.0)
HCT: 31.9 % — ABNORMAL LOW (ref 39.0–52.0)
HCT: 31.9 % — ABNORMAL LOW (ref 39.0–52.0)
HCT: 32.3 % — ABNORMAL LOW (ref 39.0–52.0)
HCT: 32.5 % — ABNORMAL LOW (ref 39.0–52.0)
HCT: 35.3 % — ABNORMAL LOW (ref 39.0–52.0)
HCT: 37.3 % — ABNORMAL LOW (ref 39.0–52.0)
HCT: 37.9 % — ABNORMAL LOW (ref 39.0–52.0)
HCT: 38.2 % — ABNORMAL LOW (ref 39.0–52.0)
HCT: 39.8 % (ref 39.0–52.0)
HCT: 40.9 % (ref 39.0–52.0)
HCT: 49.3 % (ref 39.0–52.0)
Hemoglobin: 10.8 g/dL — ABNORMAL LOW (ref 13.0–17.0)
Hemoglobin: 11 g/dL — ABNORMAL LOW (ref 13.0–17.0)
Hemoglobin: 11.1 g/dL — ABNORMAL LOW (ref 13.0–17.0)
Hemoglobin: 11.2 g/dL — ABNORMAL LOW (ref 13.0–17.0)
Hemoglobin: 11.2 g/dL — ABNORMAL LOW (ref 13.0–17.0)
Hemoglobin: 12.2 g/dL — ABNORMAL LOW (ref 13.0–17.0)
Hemoglobin: 12.8 g/dL — ABNORMAL LOW (ref 13.0–17.0)
Hemoglobin: 13.1 g/dL (ref 13.0–17.0)
Hemoglobin: 13.3 g/dL (ref 13.0–17.0)
Hemoglobin: 13.8 g/dL (ref 13.0–17.0)
Hemoglobin: 14.1 g/dL (ref 13.0–17.0)
Hemoglobin: 17 g/dL (ref 13.0–17.0)
MCHC: 34.2 g/dL (ref 30.0–36.0)
MCHC: 34.4 g/dL (ref 30.0–36.0)
MCHC: 34.4 g/dL (ref 30.0–36.0)
MCHC: 34.4 g/dL (ref 30.0–36.0)
MCHC: 34.4 g/dL (ref 30.0–36.0)
MCHC: 34.5 g/dL (ref 30.0–36.0)
MCHC: 34.6 g/dL (ref 30.0–36.0)
MCHC: 34.6 g/dL (ref 30.0–36.0)
MCHC: 34.6 g/dL (ref 30.0–36.0)
MCHC: 34.8 g/dL (ref 30.0–36.0)
MCHC: 35 g/dL (ref 30.0–36.0)
MCHC: 35 g/dL (ref 30.0–36.0)
MCV: 87.2 fL (ref 78.0–100.0)
MCV: 87.4 fL (ref 78.0–100.0)
MCV: 87.7 fL (ref 78.0–100.0)
MCV: 87.8 fL (ref 78.0–100.0)
MCV: 87.9 fL (ref 78.0–100.0)
MCV: 87.9 fL (ref 78.0–100.0)
MCV: 87.9 fL (ref 78.0–100.0)
MCV: 88.2 fL (ref 78.0–100.0)
MCV: 88.2 fL (ref 78.0–100.0)
MCV: 88.4 fL (ref 78.0–100.0)
MCV: 88.4 fL (ref 78.0–100.0)
MCV: 88.6 fL (ref 78.0–100.0)
Platelets: 107 10*3/uL — ABNORMAL LOW (ref 150–400)
Platelets: 111 10*3/uL — ABNORMAL LOW (ref 150–400)
Platelets: 115 K/uL — ABNORMAL LOW (ref 150–400)
Platelets: 123 10*3/uL — ABNORMAL LOW (ref 150–400)
Platelets: 126 K/uL — ABNORMAL LOW (ref 150–400)
Platelets: 134 10*3/uL — ABNORMAL LOW (ref 150–400)
Platelets: 150 10*3/uL (ref 150–400)
Platelets: 162 10*3/uL (ref 150–400)
Platelets: 186 10*3/uL (ref 150–400)
Platelets: 213 10*3/uL (ref 150–400)
Platelets: 261 10*3/uL (ref 150–400)
Platelets: 291 10*3/uL (ref 150–400)
RBC: 3.53 MIL/uL — ABNORMAL LOW (ref 4.22–5.81)
RBC: 3.6 MIL/uL — ABNORMAL LOW (ref 4.22–5.81)
RBC: 3.64 MIL/uL — ABNORMAL LOW (ref 4.22–5.81)
RBC: 3.67 MIL/uL — ABNORMAL LOW (ref 4.22–5.81)
RBC: 3.68 MIL/uL — ABNORMAL LOW (ref 4.22–5.81)
RBC: 4 MIL/uL — ABNORMAL LOW (ref 4.22–5.81)
RBC: 4.25 MIL/uL (ref 4.22–5.81)
RBC: 4.33 MIL/uL (ref 4.22–5.81)
RBC: 4.34 MIL/uL (ref 4.22–5.81)
RBC: 4.53 MIL/uL (ref 4.22–5.81)
RBC: 4.68 MIL/uL (ref 4.22–5.81)
RBC: 5.63 MIL/uL (ref 4.22–5.81)
RDW: 13.9 % (ref 11.5–15.5)
RDW: 13.9 % (ref 11.5–15.5)
RDW: 14.1 % (ref 11.5–15.5)
RDW: 14.1 % (ref 11.5–15.5)
RDW: 14.1 % (ref 11.5–15.5)
RDW: 14.1 % (ref 11.5–15.5)
RDW: 14.1 % (ref 11.5–15.5)
RDW: 14.3 % (ref 11.5–15.5)
RDW: 14.3 % (ref 11.5–15.5)
RDW: 14.3 % (ref 11.5–15.5)
RDW: 14.3 % (ref 11.5–15.5)
RDW: 14.4 % (ref 11.5–15.5)
WBC: 10.1 10*3/uL (ref 4.0–10.5)
WBC: 10.1 10*3/uL (ref 4.0–10.5)
WBC: 13.5 10*3/uL — ABNORMAL HIGH (ref 4.0–10.5)
WBC: 15.5 K/uL — ABNORMAL HIGH (ref 4.0–10.5)
WBC: 17.5 10*3/uL — ABNORMAL HIGH (ref 4.0–10.5)
WBC: 17.5 10*3/uL — ABNORMAL HIGH (ref 4.0–10.5)
WBC: 19.7 10*3/uL — ABNORMAL HIGH (ref 4.0–10.5)
WBC: 20.2 K/uL — ABNORMAL HIGH (ref 4.0–10.5)
WBC: 7.7 10*3/uL (ref 4.0–10.5)
WBC: 8.3 10*3/uL (ref 4.0–10.5)
WBC: 9.4 10*3/uL (ref 4.0–10.5)
WBC: 9.6 10*3/uL (ref 4.0–10.5)

## 2010-08-01 LAB — CREATININE, SERUM
Creatinine, Ser: 1.26 mg/dL (ref 0.4–1.5)
Creatinine, Ser: 1.48 mg/dL (ref 0.4–1.5)
GFR calc Af Amer: >60 mL/min (ref >60)
GFR calc non Af Amer: 51 mL/min — ABNORMAL LOW
GFR calc non Af Amer: >60 mL/min (ref >60)

## 2010-08-01 LAB — BASIC METABOLIC PANEL WITH GFR
BUN: 19 mg/dL (ref 6–23)
CO2: 24 meq/L (ref 19–32)
Calcium: 7.2 mg/dL — ABNORMAL LOW (ref 8.4–10.5)
Chloride: 102 meq/L (ref 96–112)
Creatinine, Ser: 1.35 mg/dL (ref 0.4–1.5)
GFR calc non Af Amer: 57 mL/min — ABNORMAL LOW
Glucose, Bld: 124 mg/dL — ABNORMAL HIGH (ref 70–99)
Potassium: 3.6 meq/L (ref 3.5–5.1)
Sodium: 132 meq/L — ABNORMAL LOW (ref 135–145)

## 2010-08-01 LAB — BLOOD GAS, ARTERIAL
Acid-Base Excess: 0.1 mmol/L (ref 0.0–2.0)
Bicarbonate: 23.7 mEq/L (ref 20.0–24.0)
FIO2: 0.28 %
O2 Saturation: 97.2 %
Patient temperature: 98.6
TCO2: 24.7 mmol/L (ref 0–100)
pCO2 arterial: 34.4 mmHg — ABNORMAL LOW (ref 35.0–45.0)
pH, Arterial: 7.452 — ABNORMAL HIGH (ref 7.350–7.450)
pO2, Arterial: 78.1 mmHg — ABNORMAL LOW (ref 80.0–100.0)

## 2010-08-01 LAB — PROTIME-INR
INR: 0.9 (ref 0.00–1.49)
INR: 1.01 (ref 0.00–1.49)
INR: 1.28 (ref 0.00–1.49)
Prothrombin Time: 12.1 seconds (ref 11.6–15.2)
Prothrombin Time: 13.2 seconds (ref 11.6–15.2)
Prothrombin Time: 15.9 s — ABNORMAL HIGH (ref 11.6–15.2)

## 2010-08-01 LAB — GLUCOSE, CAPILLARY
Glucose-Capillary: 100 mg/dL — ABNORMAL HIGH (ref 70–99)
Glucose-Capillary: 100 mg/dL — ABNORMAL HIGH (ref 70–99)
Glucose-Capillary: 102 mg/dL — ABNORMAL HIGH (ref 70–99)
Glucose-Capillary: 102 mg/dL — ABNORMAL HIGH (ref 70–99)
Glucose-Capillary: 103 mg/dL — ABNORMAL HIGH (ref 70–99)
Glucose-Capillary: 105 mg/dL — ABNORMAL HIGH (ref 70–99)
Glucose-Capillary: 109 mg/dL — ABNORMAL HIGH (ref 70–99)
Glucose-Capillary: 112 mg/dL — ABNORMAL HIGH (ref 70–99)
Glucose-Capillary: 114 mg/dL — ABNORMAL HIGH (ref 70–99)
Glucose-Capillary: 115 mg/dL — ABNORMAL HIGH (ref 70–99)
Glucose-Capillary: 120 mg/dL — ABNORMAL HIGH (ref 70–99)
Glucose-Capillary: 121 mg/dL — ABNORMAL HIGH (ref 70–99)
Glucose-Capillary: 126 mg/dL — ABNORMAL HIGH (ref 70–99)
Glucose-Capillary: 128 mg/dL — ABNORMAL HIGH (ref 70–99)
Glucose-Capillary: 130 mg/dL — ABNORMAL HIGH (ref 70–99)
Glucose-Capillary: 134 mg/dL — ABNORMAL HIGH (ref 70–99)
Glucose-Capillary: 138 mg/dL — ABNORMAL HIGH (ref 70–99)
Glucose-Capillary: 140 mg/dL — ABNORMAL HIGH (ref 70–99)
Glucose-Capillary: 143 mg/dL — ABNORMAL HIGH (ref 70–99)
Glucose-Capillary: 153 mg/dL — ABNORMAL HIGH (ref 70–99)
Glucose-Capillary: 174 mg/dL — ABNORMAL HIGH (ref 70–99)
Glucose-Capillary: 191 mg/dL — ABNORMAL HIGH (ref 70–99)
Glucose-Capillary: 84 mg/dL (ref 70–99)
Glucose-Capillary: 89 mg/dL (ref 70–99)
Glucose-Capillary: 96 mg/dL (ref 70–99)
Glucose-Capillary: 98 mg/dL (ref 70–99)

## 2010-08-01 LAB — POCT CARDIAC MARKERS
CKMB, poc: 2.1 ng/mL (ref 1.0–8.0)
Myoglobin, poc: 83.8 ng/mL (ref 12–200)
Troponin i, poc: <0.05 ng/mL (ref 0.00–0.09)

## 2010-08-01 LAB — APTT
aPTT: 30 seconds (ref 24–37)
aPTT: 31 s (ref 24–37)

## 2010-08-01 LAB — POCT I-STAT 3, VENOUS BLOOD GAS (G3P V)
Bicarbonate: 25.8 mEq/L — ABNORMAL HIGH (ref 20.0–24.0)
O2 Saturation: 79 %
TCO2: 27 mmol/L (ref 0–100)
pCO2, Ven: 48 mmHg (ref 45.0–50.0)
pH, Ven: 7.338 — ABNORMAL HIGH (ref 7.250–7.300)
pO2, Ven: 47 mmHg — ABNORMAL HIGH (ref 30.0–45.0)

## 2010-08-01 LAB — HEPARIN LEVEL (UNFRACTIONATED): Heparin Unfractionated: 0.22 IU/mL — ABNORMAL LOW (ref 0.30–0.70)

## 2010-08-01 LAB — LIPID PANEL
HDL: 29 mg/dL — ABNORMAL LOW
Total CHOL/HDL Ratio: 7.7 ratio
Triglycerides: 121 mg/dL
VLDL: 24 mg/dL (ref 0–40)

## 2010-08-01 LAB — URINALYSIS, ROUTINE W REFLEX MICROSCOPIC
Bilirubin Urine: NEGATIVE
Glucose, UA: NEGATIVE mg/dL
Hgb urine dipstick: NEGATIVE
Ketones, ur: NEGATIVE mg/dL
Nitrite: NEGATIVE
Protein, ur: NEGATIVE mg/dL
Specific Gravity, Urine: 1.024 (ref 1.005–1.030)
Urobilinogen, UA: 0.2 mg/dL (ref 0.0–1.0)
pH: 7 (ref 5.0–8.0)

## 2010-08-01 LAB — CULTURE, RESPIRATORY W GRAM STAIN

## 2010-08-01 LAB — POCT I-STAT GLUCOSE
Glucose, Bld: 101 mg/dL — ABNORMAL HIGH (ref 70–99)
Operator id: 125961

## 2010-08-01 LAB — CULTURE, RESPIRATORY

## 2010-08-01 LAB — MAGNESIUM
Magnesium: 2.2 mg/dL (ref 1.5–2.5)
Magnesium: 2.3 mg/dL (ref 1.5–2.5)
Magnesium: 2.6 mg/dL — ABNORMAL HIGH (ref 1.5–2.5)

## 2010-08-01 LAB — ABO/RH: ABO/RH(D): A NEG

## 2010-08-01 LAB — HEMOGLOBIN AND HEMATOCRIT, BLOOD
HCT: 30.6 % — ABNORMAL LOW (ref 39.0–52.0)
Hemoglobin: 10.6 g/dL — ABNORMAL LOW (ref 13.0–17.0)

## 2010-08-01 LAB — MRSA PCR SCREENING: MRSA by PCR: NEGATIVE

## 2010-08-01 LAB — PLATELET COUNT: Platelets: 110 10*3/uL — ABNORMAL LOW (ref 150–400)

## 2010-08-07 ENCOUNTER — Telehealth: Payer: Self-pay | Admitting: Cardiovascular Disease

## 2010-08-07 DIAGNOSIS — E785 Hyperlipidemia, unspecified: Secondary | ICD-10-CM

## 2010-08-07 DIAGNOSIS — I2510 Atherosclerotic heart disease of native coronary artery without angina pectoris: Secondary | ICD-10-CM

## 2010-08-07 MED ORDER — CLOPIDOGREL BISULFATE 75 MG PO TABS: 75.0000 mg | ORAL_TABLET | Freq: Every day | ORAL | Status: DC

## 2010-08-07 MED ORDER — ROSUVASTATIN CALCIUM 10 MG PO TABS: 10.0000 mg | ORAL_TABLET | Freq: Every day | ORAL | Status: DC

## 2010-08-07 NOTE — Telephone Encounter (Signed)
WANTS SAMPLES OF PLAVIX AND CRESTOR

## 2010-08-07 NOTE — Telephone Encounter (Signed)
No samples available, to call back in a week. Script was printed and mailed at pt request. Alfonso Ramus RN

## 2010-09-02 ENCOUNTER — Other Ambulatory Visit: Payer: Self-pay | Admitting: Cardiovascular Disease

## 2010-09-02 NOTE — Telephone Encounter (Signed)
Pt called he wants samples of Plavix please call if have any

## 2010-09-02 NOTE — Telephone Encounter (Signed)
Pt called for Plavix samples. Notified pt of samples available also asked for crestor samples 20mg  tabs and he cuts them in half. Samples also given of crestor. Pt to pick them up today 09/02/2010

## 2010-09-10 NOTE — Assessment & Plan Note (Signed)
OFFICE VISIT   [name], Allen RINN  DOB:  20-Nov-1960                                        March 15, 2009  CHART #:  [id number]   On February 13, 2009, the patient had coronary artery bypass grafting x5  with left internal mammary to the LAD, reverse saphenous vein to the  intermediate coronary artery, reverse saphenous vein to the distal  circumflex, sequential reverse saphenous vein graft to the distal right  and posterior descending coronary artery.  Postoperatively, he has made  good progress.  He has had no recurrent symptoms of congestive heart  failure or angina.   In the office today, his blood pressure is elevated slightly 158/98,  pulse is 48, respiratory rate is 18.  His sternum is stable and well  healed.  Lungs are clear.  His lower extremities are without significant  edema.  The vein harvest sites are healing well.   Preoperative, he had been a heavy smoker.  He has now completely stopped  smoking, never got a nicotine patch.   He continues on amiodarone 200 mg twice a day.  I have asked him to  decrease this to once a day and then when he sees Dr. Mariel Sleet discuss  stopping it in the near future.  He is on aspirin 325 once a day.  He is  on Lasix and potassium for 2 additional days, and when prescriptions are  complete he will discontinue it.  He is also on clonidine 0.1 mg twice a  day, Pravachol 40 mg a day, metoprolol 25 mg b.i.d.   Followup chest x-ray shows clear lung fields bilaterally.   Overall, I am very pleased with his progress.  I discussed with him  enrolling in cardiac rehab which he will plans to do.  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  I have not made him a return appointment to see me  but would be glad to see him at his or Dr. Harvie Bridge request at any time.   Sheliah Plane, MD  Electronically Signed   EG/MEDQ  D:  03/15/2009  T:  03/16/2009  Job:  [id number]   cc:   Vesta Mixer, M.D.

## 2010-09-10 NOTE — Assessment & Plan Note (Signed)
OFFICE VISIT   [name], [name]  DOB:  [birth year]                                       10/24/2009  [id number]#:[id number]   The patient is a 50 year old male referred by Dr. Elease Hashimoto for lower  extremity claudication symptoms.  The patient had a cardiac  catheterization by Dr. Elease Hashimoto last week.  He was noted on the cardiac  cath to have an 80% left common iliac artery stenosis and a greater than  90% right common iliac stenosis.  The patient states he has severe  claudication in both lower extremities starting at one block.  Right leg  is worse than left.  He denies any numbness or tingling in his feet.  He  denies any history of diabetes.  He denies any rest pain.  He denies any  previous ulcerations on his feet.   Chronic medical problems include hypertension, elevated cholesterol,  coronary artery disease.  These are all currently stable and followed by  Dr. Elease Hashimoto.   Past medical history is otherwise fairly unremarkable.   SOCIAL HISTORY:  He works as a Geneticist, molecular.  He is a former  smoker, quit in October of 2010.  He does not consume alcohol regularly.  He has two children.  He is married.   Family history is unremarkable.   REVIEW OF SYSTEMS:  A full 12 point review of systems was performed with  the patient today.  Please see intake referral form for details  regarding this.   PHYSICAL EXAM:  Vital signs:  Blood pressure is 148/93 in the right arm,  heart rate 62 and regular.  Temperature is 97.7.  HEENT:  Unremarkable.  Neck:  Has 2+ carotid pulses without bruit.  Chest:  Clear to  auscultation.  Cardiac:  Regular rate and without murmur.  He has a well-  healed sternotomy scar.  Abdomen:  Is slightly obese, soft, nontender,  nondistended.  No masses.  Extremities:  He has an absent right femoral  pulse.  He has a 1+ left femoral pulse.  He has a 1+ left dorsalis pedis  pulse.  He has absent popliteal and pedal pulses in the right leg.  Musculoskeletal:  Exam shows symmetric, nondeformed joints.  Neurological:  Exam shows symmetric upper extremity and lower extremity  motor strength which is 5/5.  Skin:  Has no open ulcers or rashes.   He had bilateral ABIs with segmental pressures today.  He did have  calcification of his vessels.  He had monophasic tibial flow on the  right leg.  He had biphasic flow on the left leg.   In summary, the patient is a 50 year old male with bilateral lower  extremity claudication which is disabling to him.  He has known  bilateral iliac stenoses.  In light of this we have scheduled him for  aortogram, bilateral lower extremity runoff angioplasty and stenting of  both iliac arteries.  We will also assess his lower extremity runoff at  the same time.  Risks, benefits, possible complications and procedure  details were explained to the patient today including but not limited to  bleeding, infection, vessel injury, contrast reaction.  He understands  and agrees to proceed.  This is scheduled for Friday 10/26/2009.     Janetta Hora. Fields, MD  Electronically Signed   CEF/MEDQ  D:  10/24/2009  T:  10/25/2009  Job:  [id number]   cc:   Vesta Mixer, M.D.

## 2010-09-16 ENCOUNTER — Encounter: Payer: Self-pay | Admitting: Cardiovascular Disease

## 2010-09-16 DIAGNOSIS — E785 Hyperlipidemia, unspecified: Secondary | ICD-10-CM | POA: Insufficient documentation

## 2010-09-16 DIAGNOSIS — R079 Chest pain, unspecified: Secondary | ICD-10-CM | POA: Insufficient documentation

## 2010-09-16 DIAGNOSIS — Z87898 Personal history of other specified conditions: Secondary | ICD-10-CM | POA: Insufficient documentation

## 2010-09-16 DIAGNOSIS — I2510 Atherosclerotic heart disease of native coronary artery without angina pectoris: Secondary | ICD-10-CM | POA: Insufficient documentation

## 2010-09-16 DIAGNOSIS — I10 Essential (primary) hypertension: Secondary | ICD-10-CM | POA: Insufficient documentation

## 2010-09-16 DIAGNOSIS — I951 Orthostatic hypotension: Secondary | ICD-10-CM | POA: Insufficient documentation

## 2010-09-16 DIAGNOSIS — R0600 Dyspnea, unspecified: Secondary | ICD-10-CM | POA: Insufficient documentation

## 2010-09-16 DIAGNOSIS — I252 Old myocardial infarction: Secondary | ICD-10-CM | POA: Insufficient documentation

## 2010-09-18 ENCOUNTER — Encounter: Payer: Self-pay | Admitting: Cardiovascular Disease

## 2010-09-18 ENCOUNTER — Ambulatory Visit (INDEPENDENT_AMBULATORY_CARE_PROVIDER_SITE_OTHER): Payer: Medicaid Other | Admitting: Cardiovascular Disease

## 2010-09-18 VITALS — BP 146/84 | HR 60 | Wt 239.0 lb

## 2010-09-18 DIAGNOSIS — R0602 Shortness of breath: Secondary | ICD-10-CM

## 2010-09-18 DIAGNOSIS — R079 Chest pain, unspecified: Secondary | ICD-10-CM

## 2010-09-18 DIAGNOSIS — I2510 Atherosclerotic heart disease of native coronary artery without angina pectoris: Secondary | ICD-10-CM

## 2010-09-18 LAB — HEPATIC FUNCTION PANEL
ALT: 21 U/L (ref 0–53)
AST: 23 U/L (ref 0–37)
Albumin: 4.3 g/dL (ref 3.5–5.2)
Alkaline Phosphatase: 82 U/L (ref 39–117)
Bilirubin, Direct: 0.1 mg/dL (ref 0.0–0.3)
Total Bilirubin: 0.5 mg/dL (ref 0.3–1.2)
Total Protein: 7.4 g/dL (ref 6.0–8.3)

## 2010-09-18 LAB — BASIC METABOLIC PANEL
BUN: 9 mg/dL (ref 6–23)
CO2: 29 mEq/L (ref 19–32)
Calcium: 9.5 mg/dL (ref 8.4–10.5)
Chloride: 107 mEq/L (ref 96–112)
Creatinine, Ser: 1.2 mg/dL (ref 0.4–1.5)
GFR: 67.99 mL/min (ref >60.00)
Glucose, Bld: 105 mg/dL — ABNORMAL HIGH (ref 70–99)
Potassium: 4.6 mEq/L (ref 3.5–5.1)
Sodium: 142 mEq/L (ref 135–145)

## 2010-09-18 LAB — LIPID PANEL
Cholesterol: 183 mg/dL (ref 0–200)
HDL: 45.5 mg/dL (ref >39.00)
LDL Cholesterol: 117 mg/dL — ABNORMAL HIGH (ref 0–99)
Total CHOL/HDL Ratio: 4
Triglycerides: 102 mg/dL (ref 0.0–149.0)
VLDL: 20.4 mg/dL (ref 0.0–40.0)

## 2010-09-18 NOTE — Assessment & Plan Note (Signed)
He has Continued musculoskeletal pain. His symptoms seem to improve after exercise. I've asked him to exercise on a more regular basis. He can continue to take Tylenol or Motrin as needed.

## 2010-09-18 NOTE — Assessment & Plan Note (Signed)
He seems to be very stable from a cardiac standpoint. I like to continue with his same medications.

## 2010-09-18 NOTE — Progress Notes (Signed)
C/O SOB with laying down at HS, spo2 @ rest is 98% when ambulating distance greater than 7ft spo2 was 96% hr up to 90.Alfonso Ramus RN

## 2010-09-18 NOTE — Progress Notes (Signed)
[name] Date of Birth  04-Jul-1960 [facility] [address]     [address] [place], [place]  [address] [phone]  Fax  [phone]  History of Present Illness:  [name] is a middle-age gentleman with a history of coronary artery disease and coronary bypass grafting. He had PTCA and stenting of his right coronary artery several months after his bypass grafting. He has done fairly well but has continued have musculoskeletal chest pain. He's not having any angina but has significant chest pain when he last and if he twists his torso. He's been limited by other orthopedic injuries and has not been exercising. He tends to feel a lot better after he's been exercising.  Current Outpatient Prescriptions on File Prior to Visit  Medication Sig Dispense Refill  . clopidogrel (PLAVIX) 75 MG tablet Take 1 tablet (75 mg total) by mouth daily.  30 tablet  11  . hydrochlorothiazide 25 MG tablet Take 25 mg by mouth daily.        Marland Kitchen lisinopril (PRINIVIL,ZESTRIL) 10 MG tablet Take 10 mg by mouth daily.        . potassium chloride (K-DUR) 10 MEQ tablet Take 1 tablet (10 mEq total) by mouth daily.  30 tablet  11  . rosuvastatin (CRESTOR) 10 MG tablet Take 1 tablet (10 mg total) by mouth at bedtime.  30 tablet  11    No Known Allergies  Past Medical History  Diagnosis Date  . Coronary artery disease     STATUS POST CABG 01/2009  . 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 Dyslipidemia   . Hypertension   . Dyspnea   . Orthostatic hypotension   . Syncope and collapse   . Chest pain     Past Surgical History  Procedure Date  . Cardiac catheterization 10/19/2009    History  Smoking status  . Former Smoker  . Quit date: 01/26/2009  Smokeless tobacco  . Not on file    History  Alcohol Use No    Family History  Problem Relation Age of Onset  . Coronary artery disease Father     Reviw of Systems:  Reviewed in the HPI.  All other systems are  negative.  Physical Exam: BP 146/84  Pulse 60  Wt 239 lb (108.41 kg) The patient is alert and oriented x 3.  The mood and affect are normal.  The skin is warm and dry.  Color is normal.  The HEENT exam reveals that the sclera are nonicteric.  The mucous membranes are moist.  The carotids are 2+ without bruits.  There is no thyromegaly.  There is no JVD.  The lungs are clear.  The chest wall is non tender.  The heart exam reveals a regular rate with a normal S1 and S2.  There are no murmurs, gallops, or rubs.  The PMI is not displaced.   Abdominal exam reveals good bowel sounds.  There is no guarding or rebound.  There is no hepatosplenomegaly or tenderness.  There are no masses.  Exam of the legs reveal no clubbing, cyanosis, or edema.  The legs are without rashes.  The distal pulses are intact.  Cranial nerves II - XII are intact.  Motor and sensory functions are intact.  The gait is normal.  ECG: Sinus bradycardia. His previous inferior wall myocardial infarction. Assessment / Plan:

## 2010-09-19 ENCOUNTER — Other Ambulatory Visit: Payer: Self-pay | Admitting: *Deleted

## 2010-09-19 DIAGNOSIS — E785 Hyperlipidemia, unspecified: Secondary | ICD-10-CM

## 2010-09-19 MED ORDER — ROSUVASTATIN CALCIUM 20 MG PO TABS: 20.0000 mg | ORAL_TABLET | Freq: Every day | ORAL | Status: DC

## 2010-09-19 NOTE — Progress Notes (Signed)
Patient called with lab results. Patient request refill. Pt verbalized understanding. Alfonso Ramus RN

## 2010-09-24 ENCOUNTER — Telehealth: Payer: Self-pay | Admitting: Cardiovascular Disease

## 2010-09-24 DIAGNOSIS — E785 Hyperlipidemia, unspecified: Secondary | ICD-10-CM

## 2010-09-24 MED ORDER — ROSUVASTATIN CALCIUM 20 MG PO TABS: 20.0000 mg | ORAL_TABLET | Freq: Every day | ORAL | Status: DC

## 2010-09-24 NOTE — Telephone Encounter (Signed)
Pt said he called last week and his refill for crestor is not at his pharmacy[facility] he has only a few pills left

## 2010-09-26 ENCOUNTER — Ambulatory Visit: Payer: Worker's Compensation | Admitting: Vascular Surgery

## 2010-10-03 ENCOUNTER — Encounter (INDEPENDENT_AMBULATORY_CARE_PROVIDER_SITE_OTHER): Payer: Medicaid Other

## 2010-10-03 ENCOUNTER — Telehealth: Payer: Self-pay | Admitting: *Deleted

## 2010-10-03 ENCOUNTER — Ambulatory Visit (INDEPENDENT_AMBULATORY_CARE_PROVIDER_SITE_OTHER): Payer: Medicaid Other | Admitting: Vascular Surgery

## 2010-10-03 DIAGNOSIS — I70219 Atherosclerosis of native arteries of extremities with intermittent claudication, unspecified extremity: Secondary | ICD-10-CM

## 2010-10-03 DIAGNOSIS — Z48812 Encounter for surgical aftercare following surgery on the circulatory system: Secondary | ICD-10-CM

## 2010-10-03 NOTE — Telephone Encounter (Signed)
This type of chest pain is typical after CABG.  We will order a CXR ( pa and lat) to evaluate.

## 2010-10-03 NOTE — Telephone Encounter (Signed)
Pt continues with pin sticking type pain in chest. Pain is bilateral chest across nipple area. Has numbness in area and occurs with movement and is very painful. Wants to know if an xray can be ordered. HX: CABG. Pain occurs with movement and lifting items. Please advise.

## 2010-10-03 NOTE — Telephone Encounter (Signed)
Pt called and will pick up order tomorrow.Alfonso Ramus RN

## 2010-10-04 ENCOUNTER — Ambulatory Visit
Admission: RE | Admit: 2010-10-04 | Discharge: 2010-10-04 | Disposition: A | Discharge status: Home / Self Care | Payer: Medicaid Other | Source: Ambulatory Visit | Attending: Cardiovascular Disease | Admitting: Cardiovascular Disease

## 2010-10-04 ENCOUNTER — Telehealth: Payer: Self-pay | Admitting: Cardiovascular Disease

## 2010-10-04 NOTE — Telephone Encounter (Signed)
Fax: LABS

## 2010-10-04 NOTE — Assessment & Plan Note (Signed)
OFFICE VISIT  [name], [name] DOB:  02/04/1961                                       10/03/2010 EAVWU#:[id number]  HISTORY OF PRESENT ILLNESS:  The patient had iliac artery stents placed in 2011.  He returns today for further followup.  He denies any claudication symptoms.  He recently has had a few ankle operations by Dr. Victorino Dike for an injury he sustained at work.  He states that these incisions healed without difficulty.  CHRONIC MEDICAL PROBLEMS:  Remain coronary artery disease, hypertension. These are followed by [facility] and Dr. Elease Hashimoto.  These are currently stable.  PAST SURGICAL HISTORY:  Two foot operations in September and December of 2011 as mentioned above.  SOCIAL HISTORY:  He is married.  He works as a Teacher, English as a foreign language but has had difficulty returning to work recently because of problems with his ankle.  REVIEW OF SYSTEMS:  He has some dyspnea when lying flat.  He also has some reflux symptoms and mild asthma.  He has had some recent weight gain.  He is 5 feet 9 inches, 226 pounds.  PHYSICAL EXAM:  Vital signs:  Blood pressure is 159/114 in the left arm, heart rate 66 and regular, respirations 20.  HEENT:  Unremarkable. Neck:  Has 2+ carotid pulses without bruit.  Chest:  Clear to auscultation.  Cardiac:  Regular rate and rhythm without murmur. Peripheral vascular:  He has 2+ radial, 2+ femoral, 1+ dorsalis pedis and 2+ posterior tibial pulses bilaterally.  Overall the patient appears to be doing well.  He had bilateral ABIs performed today which were 1.14 on the right, 1.18 on the left with patent iliac stents.  He will return for followup in 1 year's time for repeat duplex and ABIs.  He will follow up sooner if he develops recurrent symptoms.  Of note, he stated he was also having some problems with his sternum recently and requested a chest x-ray to evaluate this. However, I advised him that he should contact the cardiac  surgeons at [facility] if he needs further evaluation of this.    Allen Hora. Fields, MD Electronically Signed  CEF/MEDQ  D:  10/04/2010  T:  10/04/2010  Job:  [id number]  cc:   Vesta Mixer, M.D. [facility]

## 2010-10-07 NOTE — Progress Notes (Signed)
Pt called with normal results.

## 2010-10-18 NOTE — Procedures (Unsigned)
AORTA-ILIAC DUPLEX EVALUATION  INDICATION:  Bilateral common iliac artery stents.  HISTORY: Diabetes:  No. Cardiac:  No. Hypertension:  Yes. Smoking:  Previous. Previous Surgery:  Bilateral common iliac artery stents on 10/26/2009.              SINGLE LEVEL ARTERIAL EXAM                             RIGHT                  LEFT Brachial:                  151                    159 Anterior tibial:           174                    171 Posterior tibial:          182                    187 Peroneal: Ankle/brachial index:      1.14                   1.18 Previous ABI/date:         10/24/2009 0.79        10/24/2009 1.05  AORTA-ILIAC DUPLEX EXAM Aorta - Proximal     99 cm/s Aorta - Mid          73 cm/s Aorta - Distal       84 cm/s  RIGHT                                   LEFT 176 cm/s          CIA-PROXIMAL          144 cm/s 225 cm/s          CIA-DISTAL            162 cm/s Not visualized    HYPOGASTRIC           Not visualized 159 cm/s          EIA-PROXIMAL          216 cm/s 172 cm/s          EIA-MID               205 cm/s 159 cm/s          EIA-DISTAL            139 cm/s  IMPRESSION: 1. Patent bilateral common iliac artery stents with velocities as     described above. 2. The bilateral ankle brachial indices and Doppler waveforms suggests     normal perfusion of the bilateral lower extremities.  Significant     improvement of the right ankle brachial index when compared to the     previous exam on 10/24/2009 with the left ankle brachial index     remaining stable.  ___________________________________________ Janetta Hora. Fields, MD  CH/MEDQ  D:  10/04/2010  T:  10/04/2010  Job:  [id number]

## 2010-10-20 ENCOUNTER — Ambulatory Visit (HOSPITAL_BASED_OUTPATIENT_CLINIC_OR_DEPARTMENT_OTHER): Payer: Medicaid Other | Attending: Cardiovascular Disease

## 2010-10-20 DIAGNOSIS — G4730 Sleep apnea, unspecified: Secondary | ICD-10-CM | POA: Insufficient documentation

## 2010-10-20 DIAGNOSIS — G4710 Hypersomnia, unspecified: Secondary | ICD-10-CM | POA: Insufficient documentation

## 2010-10-24 ENCOUNTER — Telehealth: Payer: Self-pay | Admitting: Cardiovascular Disease

## 2010-10-24 NOTE — Telephone Encounter (Signed)
I called [facility] sleep lab, study not scored yet. Pt informed.Alfonso Ramus RN

## 2010-10-24 NOTE — Telephone Encounter (Signed)
Wants RN to call him back regarding his sleep study results

## 2010-10-28 ENCOUNTER — Telehealth: Payer: Self-pay | Admitting: Cardiovascular Disease

## 2010-10-28 NOTE — Telephone Encounter (Signed)
Please call patient regarding his sleep study results...done about 2 wks ago-according to patient

## 2010-10-29 ENCOUNTER — Telehealth: Payer: Self-pay | Admitting: *Deleted

## 2010-10-29 NOTE — Telephone Encounter (Signed)
Sleep lab was holding reports due to them not being signed, they faxed them anyway and pt informed i would have dr Ian Bushman review and will call back this afternoon.

## 2010-10-29 NOTE — Telephone Encounter (Signed)
Normal sleep study results, told pt to f/u with pcp for further pulmonary problems.

## 2010-11-02 DIAGNOSIS — G4730 Sleep apnea, unspecified: Secondary | ICD-10-CM

## 2010-11-02 DIAGNOSIS — G4710 Hypersomnia, unspecified: Secondary | ICD-10-CM

## 2010-11-02 NOTE — Procedures (Signed)
NAME:  [name], PRUSS NO.:  [id number]  MEDICAL RECORD NO.:  [id number]          PATIENT TYPE:  OUT  LOCATION:  SLEEP CENTER                 FACILITY:  [facility]  PHYSICIAN:  Juanice Warburton D. Maple Hudson, MD, FCCP, FACPDATE OF BIRTH:  1961/01/04  DATE OF STUDY:  10/20/2010                           NOCTURNAL POLYSOMNOGRAM  REFERRING PHYSICIAN:  PHILIP NAHSER  INDICATION FOR STUDY:  Hypersomnia with sleep apnea.  EPWORTH SLEEPINESS SCORE:  17/24, BMI 32.4.  Weight 239 pounds, height 72 inches.  MEDICATIONS:  Home medications are charted and reviewed.  SLEEP ARCHITECTURE:  Total sleep time 334 minutes with sleep efficiency 91.6%.  Stage I was 6.3%, stage II 77.8%, stage III absent, REM 15.9% of total sleep time.  Sleep latency 3.5 minutes, REM latency 84.5 minutes, awake after sleep onset 27 minutes, arousal index 15.3.  BEDTIME MEDICATION:  None.  RESPIRATORY DATA:  Apnea-hypopnea index (AHI) 2 per hour.  A total of 11 events was scored including 9 obstructive apneas, 1 central apnea and 1 hypopnea.  All events were associated with non supine sleep position. REM/AHI 6.8 per hour, RDI 10.4 per hour.  There were insufficient numbers of events to qualify for CPAP titration by split protocol on the study.  OXYGEN DATA:  Loud snoring with oxygen desaturation to a nadir of 93% and mean oxygen saturation on room air of 96.9% through the study.  CARDIAC DATA:  Normal sinus rhythm.  MOVEMENT-PARASOMNIA:  No significant movement disturbance.  No bathroom trips.  IMPRESSIONS-RECOMMENDATIONS:  Occasional respiratory event with sleep disturbance, within normal limits, AHI 2 per hour (normal range 0-5 per hour).  Non supine events reflected preferred sleep position.  Loud snoring with oxygen desaturation to a nadir of 93% and a mean oxygen saturation through the study of 96.9% on room air.     Emeril Stille D. Maple Hudson, MD, [facility], FACP Diplomate, Biomedical engineer of Sleep  Medicine Electronically Signed    CDY/MEDQ  D:  11/02/2010 11:20:54  T:  11/02/2010 12:48:42  Job:  [id number]

## 2010-11-12 ENCOUNTER — Encounter: Payer: Self-pay | Admitting: Cardiovascular Disease

## 2010-11-12 ENCOUNTER — Ambulatory Visit (INDEPENDENT_AMBULATORY_CARE_PROVIDER_SITE_OTHER): Payer: Medicaid Other | Admitting: Cardiovascular Disease

## 2010-11-12 VITALS — BP 138/76 | HR 74 | Ht 69.0 in | Wt 245.0 lb

## 2010-11-12 DIAGNOSIS — R252 Cramp and spasm: Secondary | ICD-10-CM

## 2010-11-12 DIAGNOSIS — I2510 Atherosclerotic heart disease of native coronary artery without angina pectoris: Secondary | ICD-10-CM

## 2010-11-12 NOTE — Assessment & Plan Note (Signed)
He has not had any episodes of angina. 

## 2010-11-12 NOTE — Progress Notes (Signed)
[name] Date of Birth  1961/03/12 [facility] / [facility] [address]     [address] [place], [place]  [address] [phone]  Fax  [phone]  History of Present Illness:  Complains of dyspnea.  Stopped smoking last June after coronary artery stenting.  Was found to have iliac disease at that time and later had iliac stents.  Now has return of claudication symptoms.  Evaluation by Dr. Darrick Penna ( [facility]) revealed normal ABI).  Cramping at night.  Current Outpatient Prescriptions on File Prior to Visit  Medication Sig Dispense Refill  . clopidogrel (PLAVIX) 75 MG tablet Take 1 tablet (75 mg total) by mouth daily.  30 tablet  11  . hydrochlorothiazide 25 MG tablet Take 25 mg by mouth daily.        Marland Kitchen lisinopril (PRINIVIL,ZESTRIL) 10 MG tablet Take 10 mg by mouth daily.        . potassium chloride (K-DUR) 10 MEQ tablet Take 1 tablet (10 mEq total) by mouth daily.  30 tablet  11  . rosuvastatin (CRESTOR) 20 MG tablet Take 1 tablet (20 mg total) by mouth at bedtime.  30 tablet  11    No Known Allergies  Past Medical History  Diagnosis Date  . Coronary artery disease     STATUS POST CABG 01/2009  . 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 Dyslipidemia   . Hypertension   . Dyspnea   . Orthostatic hypotension   . Syncope and collapse   . Chest pain     Past Surgical History  Procedure Date  . Cardiac catheterization 10/19/2009    History  Smoking status  . Former Smoker  . Quit date: 01/26/2009  Smokeless tobacco  . Not on file    History  Alcohol Use No    Family History  Problem Relation Age of Onset  . Coronary artery disease Father     Reviw of Systems:  Reviewed in the HPI.  All other systems are negative.  Physical Exam: BP 138/76  Pulse 74  Ht 5\' 9"  (1.753 m)  Wt 245 lb (111.131 kg)  BMI 36.18 kg/m2 The patient is alert and oriented x 3.  The mood and affect are normal.   Skin: warm and dry.  Color is normal.    HEENT:   the  sclera are nonicteric.  The mucous membranes are moist.  The carotids are 2+ without bruits.  There is no thyromegaly.  There is no JVD.    Lungs: clear.  The chest wall is non tender.    Heart: regular rate with a normal S1 and S2.  There are no murmurs, gallops, or rubs. The PMI is not displaced.     Abdomin: good bowel sounds.  There is no guarding or rebound.  There is no hepatosplenomegaly or tenderness.  There are no masses.   Extremities:  no clubbing, cyanosis, or edema.  The legs are without rashes.  The distal pulses are excellant.   Neuro:  Cranial nerves II - XII are intact.  Motor and sensory functions are intact.    The gait is normal.  Assessment / Plan:

## 2010-11-12 NOTE — Assessment & Plan Note (Signed)
[name] has excellent distal pulses. I do not think that his extensor do to a vascular etiology. In addition, he was seen at the vein the vascular specialist last week and his ankle-brachial indexes were normal.  It is possible that he has some small vessel disease although I doubt is the issue. I've suggested that he try taking a small spoon of mustard or drinking a small sip of  pickle juice to help with his cramps. He's to  consult with his general medical doctor for Further solutions.

## 2010-12-20 ENCOUNTER — Other Ambulatory Visit (INDEPENDENT_AMBULATORY_CARE_PROVIDER_SITE_OTHER): Payer: Medicaid Other | Admitting: *Deleted

## 2010-12-20 ENCOUNTER — Other Ambulatory Visit: Payer: Medicaid Other | Admitting: *Deleted

## 2010-12-20 DIAGNOSIS — E785 Hyperlipidemia, unspecified: Secondary | ICD-10-CM

## 2010-12-20 LAB — BASIC METABOLIC PANEL
BUN: 21 mg/dL (ref 6–23)
CO2: 28 mEq/L (ref 19–32)
Calcium: 10.2 mg/dL (ref 8.4–10.5)
Chloride: 106 mEq/L (ref 96–112)
Creatinine, Ser: 1.2 mg/dL (ref 0.4–1.5)
GFR: 66.65 mL/min (ref >60.00)
Glucose, Bld: 95 mg/dL (ref 70–99)
Potassium: 4.6 mEq/L (ref 3.5–5.1)
Sodium: 141 mEq/L (ref 135–145)

## 2010-12-20 LAB — HEPATIC FUNCTION PANEL
ALT: 22 U/L (ref 0–53)
AST: 21 U/L (ref 0–37)
Albumin: 4.2 g/dL (ref 3.5–5.2)
Alkaline Phosphatase: 71 U/L (ref 39–117)
Bilirubin, Direct: 0.2 mg/dL (ref 0.0–0.3)
Total Bilirubin: 0.7 mg/dL (ref 0.3–1.2)
Total Protein: 7 g/dL (ref 6.0–8.3)

## 2010-12-20 LAB — LIPID PANEL
Cholesterol: 167 mg/dL (ref 0–200)
HDL: 46.1 mg/dL (ref >39.00)
LDL Cholesterol: 101 mg/dL — ABNORMAL HIGH (ref 0–99)
Total CHOL/HDL Ratio: 4
Triglycerides: 99 mg/dL (ref 0.0–149.0)
VLDL: 19.8 mg/dL (ref 0.0–40.0)

## 2011-01-30 DIAGNOSIS — G894 Chronic pain syndrome: Secondary | ICD-10-CM | POA: Insufficient documentation

## 2011-01-30 DIAGNOSIS — G8929 Other chronic pain: Secondary | ICD-10-CM | POA: Insufficient documentation

## 2011-01-31 DIAGNOSIS — G629 Polyneuropathy, unspecified: Secondary | ICD-10-CM | POA: Insufficient documentation

## 2011-01-31 DIAGNOSIS — M25579 Pain in unspecified ankle and joints of unspecified foot: Secondary | ICD-10-CM | POA: Insufficient documentation

## 2011-01-31 DIAGNOSIS — M79673 Pain in unspecified foot: Secondary | ICD-10-CM | POA: Insufficient documentation

## 2011-02-03 ENCOUNTER — Ambulatory Visit (INDEPENDENT_AMBULATORY_CARE_PROVIDER_SITE_OTHER): Payer: Medicaid Other | Admitting: Pulmonary Disease

## 2011-02-03 ENCOUNTER — Encounter: Payer: Self-pay | Admitting: Pulmonary Disease

## 2011-02-03 VITALS — BP 116/80 | HR 81 | Temp 97.9°F | Ht 69.0 in | Wt 249.6 lb

## 2011-02-03 DIAGNOSIS — R0989 Other specified symptoms and signs involving the circulatory and respiratory systems: Secondary | ICD-10-CM

## 2011-02-03 DIAGNOSIS — R0600 Dyspnea, unspecified: Secondary | ICD-10-CM

## 2011-02-03 NOTE — Progress Notes (Signed)
  Subjective:    Patient ID: [name], male    DOB: 04-21-1961, 50 y.o.   MRN: [id number]  HPI PCP - Gilmore Laroche, [employer]- nahser 50/M, ex smoker, presents for evaluation of dyspnea that started 8/12 unrelated to activity. He smoked 90 Pyrs before quitting in '10 He has a history of coronary artery disease and coronary bypass grafting '10. He had PTCA and stenting of his right coronary artery several months after his bypass grafting. EF was 50-55% in 2010. He has iliac stents Dyspnea is unrelated to activity, to diurnal or seasonal variation. Does not have congestion or wheezing, or associated chest pain, palpitations. 9/12 lisinopril stopped,  cough better but dyspnea persisted CXR 6/12 wnl  Meds tried -advair, spiriva - no relief. daliresp samples were given PSG mild, predom RERAs,  AHI 2/h, RDI 10/h Spirometry showed severe restriction with FEv1 35%, FVC 32% & ratio 71 No h/o recent surgery - he had ankle surgeyr in the past, no F/h/o blood clots. He denies anxiety, panic attacks or significant stressors.   Review of Systems  Constitutional: Negative for fever, appetite change and unexpected weight change.  HENT: Negative for ear pain, congestion, sore throat, rhinorrhea, sneezing, trouble swallowing, dental problem and postnasal drip.   Eyes: Negative for redness.  Respiratory: Positive for cough and shortness of breath. Negative for wheezing.   Cardiovascular: Negative for chest pain, palpitations and leg swelling.  Gastrointestinal: Negative for nausea, vomiting, abdominal pain and diarrhea.  Genitourinary: Negative for dysuria and urgency.  Musculoskeletal: Negative for joint swelling.  Skin: Negative for rash.  Neurological: Negative for syncope and headaches.  Hematological: Does not bruise/bleed easily.  Psychiatric/Behavioral: Negative for dysphoric mood. The patient is not nervous/anxious.        Objective:   Physical Exam Gen. Pleasant, obese, in no  distress, normal affect ENT - no lesions, no post nasal drip, class 2-3 airway Neck: No JVD, no thyromegaly, no carotid bruits Lungs: no use of accessory muscles, no dullness to percussion, decreased without rales or rhonchi  Cardiovascular: Rhythm regular, heart sounds  normal, no murmurs or gallops, no peripheral edema Abdomen: soft and non-tender, no hepatosplenomegaly, BS normal. Musculoskeletal: No deformities, no cyanosis or clubbing Neuro:  alert, non focal, no tremors        Assessment & Plan:

## 2011-02-03 NOTE — Patient Instructions (Addendum)
CT scan of chest to look for blood clots Full breathing test Trial of albuterol inhaler -sample - 2 puffs to take up to 4 times/ day as needed every 6 hours Continue Spiriva

## 2011-02-05 ENCOUNTER — Other Ambulatory Visit: Payer: Medicaid Other

## 2011-02-05 DIAGNOSIS — R0600 Dyspnea, unspecified: Secondary | ICD-10-CM

## 2011-02-05 LAB — D-DIMER, QUANTITATIVE: D-Dimer, Quant: 0.52 ug/mL-FEU — ABNORMAL HIGH (ref 0.00–0.48)

## 2011-02-05 NOTE — Assessment & Plan Note (Signed)
Unclear cause - spirometry suggests more restriction rather than obstruction. COPD meds did not provide any relief. No evidence of pulmonary hypertension on review of cath data. CT scan of chest to look for blood clots although his only risk factor is ankle surgery. Full PFT Trial of albuterol inhaler -sample - 2 puffs to take up to 4 times/ day as needed every 6 hours Continue Spiriva I discussed pulm rehab with him but he would like to explore the cause for his dyspnea.

## 2011-02-06 ENCOUNTER — Other Ambulatory Visit: Payer: Self-pay | Admitting: Pulmonary Disease

## 2011-02-06 DIAGNOSIS — R0600 Dyspnea, unspecified: Secondary | ICD-10-CM

## 2011-02-06 NOTE — Progress Notes (Signed)
Quick Note:  Test ordered. ______

## 2011-02-10 ENCOUNTER — Ambulatory Visit (INDEPENDENT_AMBULATORY_CARE_PROVIDER_SITE_OTHER)
Admission: RE | Admit: 2011-02-10 | Discharge: 2011-02-10 | Disposition: A | Discharge status: Home / Self Care | Payer: Medicaid Other | Source: Ambulatory Visit | Attending: Pulmonary Disease | Admitting: Pulmonary Disease

## 2011-02-10 DIAGNOSIS — R0609 Other forms of dyspnea: Secondary | ICD-10-CM

## 2011-02-10 DIAGNOSIS — R0600 Dyspnea, unspecified: Secondary | ICD-10-CM

## 2011-02-10 DIAGNOSIS — R0989 Other specified symptoms and signs involving the circulatory and respiratory systems: Secondary | ICD-10-CM

## 2011-02-10 MED ORDER — IOHEXOL 300 MG/ML  SOLN
80.0000 mL | Freq: Once | INTRAMUSCULAR | Status: AC | PRN
  Administered 2011-02-10: 80 mL via INTRAVENOUS

## 2011-02-12 NOTE — Progress Notes (Signed)
Quick Note:  I informed pt's spouse of RA's findings and recommendations. Pt's spouse verbalized understanding.  ______

## 2011-02-18 ENCOUNTER — Ambulatory Visit (INDEPENDENT_AMBULATORY_CARE_PROVIDER_SITE_OTHER): Payer: Medicaid Other | Admitting: Pulmonary Disease

## 2011-02-18 DIAGNOSIS — R0989 Other specified symptoms and signs involving the circulatory and respiratory systems: Secondary | ICD-10-CM

## 2011-02-18 DIAGNOSIS — R0609 Other forms of dyspnea: Secondary | ICD-10-CM

## 2011-02-18 DIAGNOSIS — R0600 Dyspnea, unspecified: Secondary | ICD-10-CM

## 2011-02-18 LAB — PULMONARY FUNCTION TEST

## 2011-02-18 NOTE — Progress Notes (Signed)
PFT done today. 

## 2011-02-23 ENCOUNTER — Telehealth: Payer: Self-pay | Admitting: Pulmonary Disease

## 2011-02-23 NOTE — Telephone Encounter (Signed)
PFTs showed mild reversible obstruction - 16% improvement with albuterol Hope albuterol is helping Stay on spiriva until next visit

## 2011-02-28 NOTE — Telephone Encounter (Signed)
I informed pt of RA's findings and recommendations. Pt verbalized understanding and c/o Albuterol not helping. States "when I can't breathe, I can't breathe". Please advise. Thanks

## 2011-03-01 NOTE — Telephone Encounter (Signed)
Pulm rehab may help with deconditioning OK to refer if he is agreeable No other recommendations.

## 2011-03-03 NOTE — Telephone Encounter (Signed)
lmomtcb x1 

## 2011-03-03 NOTE — Telephone Encounter (Signed)
PT RETURNED CALL. SAME #. [name]

## 2011-03-03 NOTE — Telephone Encounter (Signed)
I informed pt of RA's findings and recommendations. Pt states "that just makes no sense" to him and request to cancel follow up since there is "nothing else he can do for me"; I advised the pt to give [place] a call if needed.

## 2011-03-21 ENCOUNTER — Ambulatory Visit: Payer: Medicaid Other | Admitting: Pulmonary Disease

## 2011-03-25 ENCOUNTER — Other Ambulatory Visit (INDEPENDENT_AMBULATORY_CARE_PROVIDER_SITE_OTHER): Payer: Medicaid Other | Admitting: *Deleted

## 2011-03-25 DIAGNOSIS — I2510 Atherosclerotic heart disease of native coronary artery without angina pectoris: Secondary | ICD-10-CM

## 2011-03-25 LAB — BASIC METABOLIC PANEL
BUN: 17 mg/dL (ref 6–23)
CO2: 24 mEq/L (ref 19–32)
Calcium: 9.5 mg/dL (ref 8.4–10.5)
Chloride: 107 mEq/L (ref 96–112)
Creatinine, Ser: 1.2 mg/dL (ref 0.4–1.5)
GFR: 67.84 mL/min (ref >60.00)
Glucose, Bld: 104 mg/dL — ABNORMAL HIGH (ref 70–99)
Potassium: 3.9 mEq/L (ref 3.5–5.1)
Sodium: 141 mEq/L (ref 135–145)

## 2011-03-25 LAB — LIPID PANEL
Cholesterol: 143 mg/dL (ref 0–200)
HDL: 37.3 mg/dL — ABNORMAL LOW (ref >39.00)
LDL Cholesterol: 92 mg/dL (ref 0–99)
Total CHOL/HDL Ratio: 4
Triglycerides: 69 mg/dL (ref 0.0–149.0)
VLDL: 13.8 mg/dL (ref 0.0–40.0)

## 2011-04-06 DIAGNOSIS — G5780 Other specified mononeuropathies of unspecified lower limb: Secondary | ICD-10-CM | POA: Insufficient documentation

## 2011-04-06 DIAGNOSIS — Z5181 Encounter for therapeutic drug level monitoring: Secondary | ICD-10-CM | POA: Insufficient documentation

## 2011-04-15 ENCOUNTER — Telehealth: Payer: Self-pay | Admitting: Cardiovascular Disease

## 2011-04-15 DIAGNOSIS — E785 Hyperlipidemia, unspecified: Secondary | ICD-10-CM

## 2011-04-15 MED ORDER — ROSUVASTATIN CALCIUM 20 MG PO TABS: 20.0000 mg | ORAL_TABLET | Freq: Every day | ORAL | Status: DC

## 2011-04-15 NOTE — Telephone Encounter (Signed)
New msg Pt said his pharmacy has faxed request for crestor three times with no response Pt said he is out of crestor now Please fax to [facility] in [place]

## 2011-04-15 NOTE — Telephone Encounter (Signed)
Called stating that [facility] had faxed refill request 3 times. Sent in today. Lm that Rx was sent in. Also LM that he needs an appointment in Jan. Per pharmacy needs prior authorization. Ph # [phone]; ID # [id number]; spoke w/Barbara. Pt has tried Simvastatin,Lipitor but could not get cholesterol down with those meds. Britta Mccreedy approved x 1 yr from today. Called pharmacy back and went thru. Notified pt he could pick up.

## 2011-05-22 ENCOUNTER — Ambulatory Visit: Payer: Medicaid Other | Admitting: Cardiovascular Disease

## 2011-05-23 ENCOUNTER — Emergency Department (HOSPITAL_COMMUNITY)
Admission: EM | Admit: 2011-05-23 | Discharge: 2011-05-23 | Disposition: A | Discharge status: Home / Self Care | Payer: Medicaid Other | Attending: Emergency Medicine | Admitting: Emergency Medicine

## 2011-05-23 ENCOUNTER — Encounter (HOSPITAL_COMMUNITY): Payer: Self-pay

## 2011-05-23 DIAGNOSIS — H5710 Ocular pain, unspecified eye: Secondary | ICD-10-CM | POA: Insufficient documentation

## 2011-05-23 DIAGNOSIS — H5789 Other specified disorders of eye and adnexa: Secondary | ICD-10-CM | POA: Insufficient documentation

## 2011-05-23 DIAGNOSIS — I252 Old myocardial infarction: Secondary | ICD-10-CM | POA: Insufficient documentation

## 2011-05-23 DIAGNOSIS — E785 Hyperlipidemia, unspecified: Secondary | ICD-10-CM | POA: Insufficient documentation

## 2011-05-23 DIAGNOSIS — I2510 Atherosclerotic heart disease of native coronary artery without angina pectoris: Secondary | ICD-10-CM | POA: Insufficient documentation

## 2011-05-23 DIAGNOSIS — Z79899 Other long term (current) drug therapy: Secondary | ICD-10-CM | POA: Insufficient documentation

## 2011-05-23 DIAGNOSIS — I10 Essential (primary) hypertension: Secondary | ICD-10-CM | POA: Insufficient documentation

## 2011-05-23 DIAGNOSIS — T1590XA Foreign body on external eye, part unspecified, unspecified eye, initial encounter: Secondary | ICD-10-CM

## 2011-05-23 MED ORDER — TOBRAMYCIN 0.3 % OP SOLN
OPHTHALMIC | Status: DC
Start: 2011-05-23 — End: 2011-05-23
  Filled 2011-05-23: qty 5

## 2011-05-23 MED ORDER — TETRACAINE HCL 0.5 % OP SOLN
2.0000 [drp] | Freq: Once | OPHTHALMIC | Status: AC
  Administered 2011-05-23: 2 [drp] via OPHTHALMIC

## 2011-05-23 MED ORDER — TETRACAINE HCL 0.5 % OP SOLN
OPHTHALMIC | Status: AC
  Administered 2011-05-23: 2 [drp] via OPHTHALMIC
  Filled 2011-05-23: qty 2

## 2011-05-23 MED ORDER — TOBRAMYCIN 0.3 % OP SOLN
2.0000 [drp] | OPHTHALMIC | Status: DC
  Administered 2011-05-23: 2 [drp] via OPHTHALMIC

## 2011-05-23 MED ORDER — FLUORESCEIN SODIUM 1 MG OP STRP
ORAL_STRIP | OPHTHALMIC | Status: DC
Start: 2011-05-23 — End: 2011-05-23
  Filled 2011-05-23: qty 1

## 2011-05-23 MED ORDER — HYDROCODONE-ACETAMINOPHEN 5-325 MG PO TABS: ORAL_TABLET | ORAL | Status: DC

## 2011-05-23 NOTE — ED Notes (Signed)
Pt presents with possible FB (wood) to right eye. Pt states he was carrying wood in the house and that's is when he started experiencing pain.

## 2011-05-23 NOTE — ED Notes (Signed)
Patient c/o something in right eye since last night after wiping eye with sleeve while bringing in wood, looks as if debris is stuck to cornea of right eye, Beverely Pace, [place] is aware

## 2011-05-23 NOTE — ED Provider Notes (Signed)
Medical screening examination/treatment/procedure(s) were performed by non-physician practitioner and as supervising physician I was immediately available for consultation/collaboration.   Fayette Hamada M Nancie Bocanegra, MD 05/23/11 2140 

## 2011-05-23 NOTE — ED Provider Notes (Addendum)
History     CSN: [id number]  Arrival date & time 05/23/11  1149   First MD Initiated Contact with Patient 05/23/11 1216      Chief Complaint  Patient presents with  . Foreign Body in Eye    (Consider location/radiation/quality/duration/timing/severity/associated sxs/prior treatment) Patient is a 51 y.o. male presenting with foreign body in eye. The history is provided by the patient.  Foreign Body in Eye The current episode started yesterday. The problem occurs constantly. The problem has been gradually worsening. Pertinent negatives include no abdominal pain, anorexia, arthralgias, chest pain, coughing, fever, neck pain or visual change. Exacerbated by: blinking. Treatments tried: flushing eye. The treatment provided no relief.    Past Medical History  Diagnosis Date  . Coronary artery disease     STATUS POST CABG 01/2009  . 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 Dyslipidemia   . Hypertension   . Dyspnea   . Orthostatic hypotension   . Syncope and collapse   . Chest pain     Past Surgical History  Procedure Date  . Cardiac catheterization 10/19/2009  . Ankle surgery     x 2    Family History  Problem Relation Age of Onset  . Coronary artery disease Father     History  Substance Use Topics  . Smoking status: Former Smoker -- 3.0 packs/day for 30 years    Types: Cigarettes, Cigars    Quit date: 01/26/2009  . Smokeless tobacco: Never Used  . Alcohol Use: No      Review of Systems  Constitutional: Negative for fever and activity change.       All ROS Neg except as noted in HPI  HENT: Negative for nosebleeds and neck pain.   Eyes: Positive for pain and redness. Negative for photophobia and discharge.  Respiratory: Negative for cough, shortness of breath and wheezing.   Cardiovascular: Negative for chest pain and palpitations.  Gastrointestinal: Negative for abdominal pain, blood in stool and anorexia.  Genitourinary: Negative for dysuria, frequency and hematuria.   Musculoskeletal: Negative for back pain and arthralgias.  Skin: Negative.   Neurological: Negative for dizziness, seizures and speech difficulty.  Psychiatric/Behavioral: Negative for hallucinations and confusion.    Allergies  Review of patient's allergies indicates no known allergies.  Home Medications   Current Outpatient Rx  Name Route Sig Dispense Refill  . CLOPIDOGREL BISULFATE 75 MG PO TABS Oral Take 1 tablet (75 mg total) by mouth daily. 30 tablet 11  . HYDROCHLOROTHIAZIDE 25 MG PO TABS Oral Take 25 mg by mouth daily.      Marland Kitchen HYDROCODONE-ACETAMINOPHEN 5-325 MG PO TABS  1 or 2 po q4h prn pain 12 tablet 0  . LOSARTAN POTASSIUM 50 MG PO TABS Oral Take 50 mg by mouth daily.      Marland Kitchen POTASSIUM CHLORIDE ER 10 MEQ PO TBCR Oral Take 1 tablet (10 mEq total) by mouth daily. 30 tablet 11  . ROSUVASTATIN CALCIUM 20 MG PO TABS Oral Take 1 tablet (20 mg total) by mouth at bedtime. 30 tablet 11  . TIOTROPIUM BROMIDE MONOHYDRATE 18 MCG IN CAPS Inhalation Place 18 mcg into inhaler and inhale daily.        BP 140/94  Pulse 69  Temp(Src) 97.9 F (36.6 C) (Oral)  Resp 20  Ht 5\' 9"  (1.753 m)  Wt 246 lb (111.585 kg)  BMI 36.33 kg/m2  SpO2 99%  Physical Exam  Nursing note and vitals reviewed. Constitutional: He is oriented to person, place, and  time. He appears well-developed and well-nourished.  Non-toxic appearance.  HENT:  Head: Normocephalic.  Right Ear: Tympanic membrane and external ear normal.  Left Ear: Tympanic membrane and external ear normal.  Eyes: EOM are normal. Pupils are equal, round, and reactive to light. Foreign body present in the right eye. Right conjunctiva is injected.         Globes firm bilat  Neck: Normal range of motion. Neck supple. Carotid bruit is not present.  Cardiovascular: Normal rate, regular rhythm, normal heart sounds, intact distal pulses and normal pulses.   Pulmonary/Chest: Breath sounds normal. No respiratory distress.  Abdominal: Soft. Bowel  sounds are normal. There is no tenderness. There is no guarding.  Musculoskeletal: Normal range of motion.  Lymphadenopathy:       Head (right side): No submandibular adenopathy present.       Head (left side): No submandibular adenopathy present.    He has no cervical adenopathy.  Neurological: He is alert and oriented to person, place, and time. He has normal strength. No cranial nerve deficit or sensory deficit.  Skin: Skin is warm and dry.  Psychiatric: He has a normal mood and affect. His speech is normal.    ED Course  Procedures (including critical care time) After examination of the lids,  the right eye was stained with fluorescein. A foreign body was noted at the 6:00 position of the right eye. Using the slit-lamp attempt made to remove the foreign body with a Q-tip however this was unsuccessful. Following this the foreign body was removed with an 18-gauge needle without problem. The foreign body and the rust ring were removed. The eye was irrigated, reexamined, and tobramycin eyedrops instilled in the right eye. Patient tolerated the procedure without problem. Pulse Ox 99% on room air. WNL by my interpretation. Labs Reviewed - No data to display No results found.   1. Foreign body, eye       MDM  I have reviewed nursing notes, vital signs, and all appropriate lab and imaging results for this patient. FB removed, pt placed on tobramycin and Rx given for norco for headache. Pt to return of see opthalmology if not improving.       Kathie Dike, PA 05/23/11 1542  Kathie Dike, [place] 06/11/11 1556

## 2011-05-23 NOTE — ED Notes (Signed)
MD at bedside. 

## 2011-05-29 ENCOUNTER — Ambulatory Visit: Payer: Medicaid Other | Admitting: Cardiovascular Disease

## 2011-06-12 ENCOUNTER — Encounter: Payer: Self-pay | Admitting: Cardiovascular Disease

## 2011-06-12 ENCOUNTER — Ambulatory Visit (INDEPENDENT_AMBULATORY_CARE_PROVIDER_SITE_OTHER): Payer: Medicaid Other | Admitting: Cardiovascular Disease

## 2011-06-12 ENCOUNTER — Telehealth: Payer: Self-pay | Admitting: Cardiovascular Disease

## 2011-06-12 VITALS — BP 131/96 | HR 66 | Ht 69.0 in | Wt 242.0 lb

## 2011-06-12 DIAGNOSIS — I2510 Atherosclerotic heart disease of native coronary artery without angina pectoris: Secondary | ICD-10-CM

## 2011-06-12 MED ORDER — LOSARTAN POTASSIUM 100 MG PO TABS: 100.0000 mg | ORAL_TABLET | Freq: Every day | ORAL | Status: DC

## 2011-06-12 NOTE — Telephone Encounter (Signed)
New Problem:     Patient called in because Dr. Dennie Maizes office told him that he needs a referral in order to be seen and refused to see him without one. Please either send a referral or call back if you have any questions.

## 2011-06-12 NOTE — Patient Instructions (Signed)
Your physician wants you to follow-up in: 6 months You will receive a reminder letter in the mail two months in advance. If you don't receive a letter, please call our office to schedule the follow-up appointment.  You have been referred to Dr Tyrone Sage for sternal wound pain from CABG 2010.    Your physician recommends that you return for a FASTING lipid profile: 6 months   Your physician has recommended you make the following change in your medication:   INCREASE YOUR LOSARTAN FROM 50 MG TO 100MG  DAILY, CONTINUE TO TAKE BP WEEKLY AND CALL OFFICE WITH ANY QUESTIONS OR CONCERNS

## 2011-06-12 NOTE — Progress Notes (Signed)
[name] Date of Birth  09/28/1960 [facility]     [place] Office  [address]    [address]   [address] [place], [place]  [address]    [place], [place]  [address] [phone]  Fax  [phone]  [phone]  Fax [phone]  Problem list: 1. Coronary artery disease: He status post CABG in October , 2010. He status post inferior wall myocardial infarction in January, 2011 with multiple stents to the native right coronary artery [name] 2. Peripheral vascular disease-status post iliac stenting 3. Dyslipidemia 4.. Hypertension  History of Present Illness:  [name] is a 51 yo with CAD and PVD.  He has had problems with his left foot and has not been able to exercise.  He continues to have lots of sternal wound pain whenever he moves his arm or lifts anything heavy. 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  Current Outpatient Prescriptions on File Prior to Visit  Medication Sig Dispense Refill  . clopidogrel (PLAVIX) 75 MG tablet Take 1 tablet (75 mg total) by mouth daily.  30 tablet  11  . hydrochlorothiazide 25 MG tablet Take 25 mg by mouth daily.        Marland Kitchen HYDROcodone-acetaminophen (NORCO) 5-325 MG per tablet 1 or 2 po q4h prn pain  12 tablet  0  . losartan (COZAAR) 50 MG tablet Take 50 mg by mouth daily.        . potassium chloride (K-DUR) 10 MEQ tablet Take 1 tablet (10 mEq total) by mouth daily.  30 tablet  11  . rosuvastatin (CRESTOR) 20 MG tablet Take 1 tablet (20 mg total) by mouth at bedtime.  30 tablet  11  . tiotropium (SPIRIVA) 18 MCG inhalation capsule Place 18 mcg into inhaler and inhale daily.          No Known Allergies  Past Medical History  Diagnosis Date  . Coronary artery disease     STATUS POST CABG 01/2009  . 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 Dyslipidemia   . Hypertension   . Dyspnea   . Orthostatic hypotension   . Syncope and collapse   . Chest pain      Past Surgical History  Procedure Date  . Cardiac catheterization 10/19/2009  . Ankle surgery     x 2    History  Smoking status  . Former Smoker -- 3.0 packs/day for 30 years  . Types: Cigarettes, Cigars  . Quit date: 01/26/2009  Smokeless tobacco  . Never Used    History  Alcohol Use No    Family History  Problem Relation Age of Onset  . Coronary artery disease Father     Reviw of Systems:  Reviewed in the HPI.  All other systems are negative.  Physical Exam: Blood pressure 131/96, pulse 66, height 5\' 9"  (1.753 m), weight 242 lb (109.77 kg). General: Well developed, well nourished, in no acute distress.  Head: Normocephalic, atraumatic, sclera non-icteric, mucus membranes are moist,   Neck: Supple. Negative for carotid bruits. JVD not elevated.  Lungs: Clear bilaterally to auscultation without wheezes, rales, or rhonchi. Breathing is unlabored.  Heart: RRR with S1 S2. No murmurs, rubs, or gallops appreciated.  Abdomen: Soft, non-tender, non-distended with normoactive bowel sounds. No hepatomegaly. No rebound/guarding. No obvious abdominal masses.  Msk:  Strength and tone appear normal for age.  Extremities: No clubbing or  cyanosis. No edema.  Distal pedal pulses are 2+ and equal bilaterally.  Neuro: Alert and oriented X 3. Moves all extremities spontaneously.  Psych:  Responds to questions appropriately with a normal affect.  ECG:  Assessment / Plan:

## 2011-06-12 NOTE — Assessment & Plan Note (Signed)
[name] is doing well from a cardiac standpoint. He complains of lots of sternal wound pain whenever he reaches his arm up or grabs something heavy. We'll have him see Dr. Tyrone Sage for reevaluation of the sternal wound.  He's not having any episodes of angina.  He may need to have further surgery on his left foot. He'll be okay to hold his Plavix for 5 days prior foot surgery if that is necessary.

## 2011-06-12 NOTE — Telephone Encounter (Signed)
Pt called stating they couldn't see him without a referral, [name]. will call Dr Tyrone Sage office and find out why they declined him an app and she will contact him back.

## 2011-06-12 NOTE — ED Provider Notes (Signed)
Medical screening examination/treatment/procedure(s) were performed by non-physician practitioner and as supervising physician I was immediately available for consultation/collaboration.   Lachell Rochette M Jru Pense, MD 06/12/11 0349 

## 2011-06-23 ENCOUNTER — Telehealth: Payer: Self-pay | Admitting: *Deleted

## 2011-06-23 NOTE — Telephone Encounter (Signed)
Noted  

## 2011-06-23 NOTE — Telephone Encounter (Signed)
Message copied by Antony Odea on Mon Jun 23, 2011  8:30 AM ------      Message from: Pricilla Holm      Created: Fri Jun 13, 2011  2:20 PM      Regarding: Appt with  [facility].       Jodette,               Spoke with Jolene with [facility] and what Mr. Julian Reil needed was a referral from his PCP because he has Medicaid [place] [employer].               I spoke with his PCP and was able to get their IP #  which is [phone] and call it to [facility].  She will now call the patient and make him an appointment.                     Jasmine December

## 2011-06-30 ENCOUNTER — Other Ambulatory Visit: Payer: Self-pay

## 2011-06-30 ENCOUNTER — Ambulatory Visit
Admission: RE | Admit: 2011-06-30 | Discharge: 2011-06-30 | Disposition: A | Discharge status: Home / Self Care | Payer: Medicaid Other | Source: Ambulatory Visit | Attending: Cardiothoracic Surgery | Admitting: Cardiothoracic Surgery

## 2011-06-30 ENCOUNTER — Encounter: Payer: Self-pay | Admitting: Cardiothoracic Surgery

## 2011-06-30 ENCOUNTER — Other Ambulatory Visit: Payer: Self-pay | Admitting: Cardiothoracic Surgery

## 2011-06-30 ENCOUNTER — Ambulatory Visit (INDEPENDENT_AMBULATORY_CARE_PROVIDER_SITE_OTHER): Payer: Medicaid Other | Admitting: Cardiothoracic Surgery

## 2011-06-30 VITALS — BP 139/95 | HR 65 | Resp 20 | Ht 73.0 in | Wt 245.0 lb

## 2011-06-30 DIAGNOSIS — T8189XA Other complications of procedures, not elsewhere classified, initial encounter: Secondary | ICD-10-CM | POA: Insufficient documentation

## 2011-06-30 DIAGNOSIS — Z951 Presence of aortocoronary bypass graft: Secondary | ICD-10-CM

## 2011-06-30 DIAGNOSIS — R0789 Other chest pain: Secondary | ICD-10-CM

## 2011-06-30 DIAGNOSIS — R079 Chest pain, unspecified: Secondary | ICD-10-CM

## 2011-06-30 DIAGNOSIS — R071 Chest pain on breathing: Secondary | ICD-10-CM

## 2011-06-30 DIAGNOSIS — IMO0002 Reserved for concepts with insufficient information to code with codable children: Secondary | ICD-10-CM

## 2011-06-30 NOTE — Progress Notes (Signed)
[address]            [place] [address]          [phone]      [name] [facility] Medical Record #[id number] Date of Birth: [birth year]  Referring: Nahser, Tiajuana Amass., MD Primary Care: Redmond Baseman, MD, MD  Chief Complaint:    Chief Complaint  Patient presents with  . Painful sternal wire    CXR, S/P cabg in 2010    History of Present Illness:    Patient is a 51 year old male who underwent urgent coronary artery bypass grafting x5 after suffering an acute anterior myocardial infarction 02/13/2009. Prior to surgery the patient had been a long-term smoker he has not smoked any more since the date of his surgery. Approximately a year and a half after his surgery he noted discomfort in the middle part of his chest when he lifted his right arm. Over the past several months this has become worse to the point where he avoids lifting his right arm. He can point to the middle portion of his sternum as the area that is most tender and tender to palpation.   Since surgery the patient had an injury to his left leg/ankle and this required several orthopedic procedures for this.  Current Activity/ Functional Status: Patient is independent with mobility/ambulation, transfers, ADL's, IADL's.   Past Medical History  Diagnosis Date  . Coronary artery disease     STATUS POST CABG 01/2009  . 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 Dyslipidemia   . Hypertension   . Dyspnea   . Orthostatic hypotension   . Syncope and collapse   . Chest pain     Past Surgical History  Procedure Date  . Cardiac catheterization 10/19/2009  . Ankle surgery     x 2    Family History  Problem Relation Age of Onset  . Coronary artery disease Father     History   Social History  . Marital Status: Married    Spouse Name: N/A    Number of Children: N/A  . Years of Education: N/A   Occupational History  . Not on file.   Social History Main Topics  .  Smoking status: Former Smoker -- 3.0 packs/day for 30 years    Types: Cigarettes, Cigars    Quit date: 01/26/2009  . Smokeless tobacco: Never Used  . Alcohol Use: No  . Drug Use: No  . Sexually Active: Not on file   Other Topics Concern  . Not on file   Social History Narrative  . No narrative on file    History  Smoking status  . Former Smoker -- 3.0 packs/day for 30 years  . Types: Cigarettes, Cigars  . Quit date: 01/26/2009  Smokeless tobacco  . Never Used    History  Alcohol Use No     No Known Allergies  Current Outpatient Prescriptions  Medication Sig Dispense Refill  . clopidogrel (PLAVIX) 75 MG tablet Take 1 tablet (75 mg total) by mouth daily.  30 tablet  11  . hydrochlorothiazide 25 MG tablet Take 25 mg by mouth daily.        Marland Kitchen HYDROcodone-acetaminophen (NORCO) 5-325 MG per tablet 1 or 2 po q4h prn pain  12 tablet  0  . losartan (COZAAR) 100 MG tablet Take 1 tablet (100 mg total) by mouth daily.  30  tablet  5  . potassium chloride (K-DUR) 10 MEQ tablet Take 1 tablet (10 mEq total) by mouth daily.  30 tablet  11  . rosuvastatin (CRESTOR) 20 MG tablet Take 1 tablet (20 mg total) by mouth at bedtime.  30 tablet  11  . tiotropium (SPIRIVA) 18 MCG inhalation capsule Place 18 mcg into inhaler and inhale daily.             Review of Systems:     Cardiac Review of Systems: Y or N  Chest Pain [  Chest wire pain not angina  ]  Resting SOB [ n  ] Exertional SOB  [n  ]  Orthopnea [ n ]   Pedal Edema [ n  ]    Palpitations [n  ] Syncope  [ n ]   Presyncope [n  ]  General Review of Systems: [Y] = yes [  ]=no Constitional: recent weight change [n  ]; anorexia [  ]; fatigue [  ]; nausea [  ]; night sweats [  ]; fever [  ]; or chills [  ];                                                                                                                                          Dental: poor dentition[  ];   Eye : blurred vision [  ]; diplopia [   ]; vision changes [  ];   Amaurosis fugax[  ]; Resp: cough [  ];  wheezing[ n ];  hemoptysis[  ]; shortness of breath[  ]; paroxysmal nocturnal dyspnea[ n ]; dyspnea on exertion[n  ]; or orthopnea[  ];  GI:  gallstones[  ], vomiting[  ];  dysphagia[  ]; melena[  ];  hematochezia [  ]; heartburn[  ];   Hx of  Colonoscopy[  ]; GU: kidney stones [  ]; hematuria[  ];   dysuria [  ];  nocturia[  ];  history of     obstruction [  ];             Skin: rash, swelling[  ];, hair loss[  ];  peripheral edema[  ];  or itching[  ]; Musculosketetal: myalgias[  ];  joint swelling[  ];  joint erythema[  ];  joint pain[ left ankle ];  back pain[  ];  Heme/Lymph: bruising[  ];  bleeding[ n ];  anemia[  ];  Neuro: TIA[  ];  headaches[  ];  stroke[  ];  vertigo[  ];  seizures[ n ];   paresthesias[ n ];  difficulty walking[ y ];  Psych:depression[  ]; anxiety[  ];  Endocrine: diabetes[  ];  thyroid dysfunction[  ];  Immunizations: Flu [ ? ]; Pneumococcal[?  ];  Other:  Physical Exam: BP 139/95  Pulse 65  Resp 20  Ht 6\' 1"  (1.854 m)  Wt 245 lb (111.131  kg)  BMI 32.32 kg/m2  SpO2 97%  General appearance: alert, cooperative and no distress Neurologic: intact Heart: regular rate and rhythm, S1, S2 normal, no murmur, click, rub or gallop Lungs: clear to auscultation bilaterally Abdomen: soft, non-tender; bowel sounds normal; no masses,  no organomegaly Extremities: extremities normal, atraumatic, no cyanosis or edema, Homans sign is negative, no sign of DVT and no edema, redness or tenderness in the calves or thighs, previos procedures on left ankle Wound: sternal incision is well healed, 4 and 5 wires from top are the most tenter on palpation, bone appears intact on palpation   Diagnostic Studies & Laboratory data:     Recent Radiology Findings:   Dg Chest 2 View  06/30/2011  *RADIOLOGY REPORT*  Clinical Data: Post CABG in 2010, sternal discomfort  CHEST - 2 VIEW  Comparison: CT chest of 02/10/2011 and chest x-ray of 10/04/2010   Findings: The lungs are clear.  Cardiomegaly is stable.  Median sternotomy sutures appear intact.  No bony abnormality is seen.  IMPRESSION: Stable chest x-ray with mild cardiomegaly.  No active lung disease.  Original Report Authenticated By: Juline Patch, M.D.      Recent Lab Findings: Lab Results  Component Value Date   WBC 8.1 03/28/2010   HGB 14.5 07/19/2010   HCT 46.7 03/28/2010   PLT 194 03/28/2010   GLUCOSE 104* 03/25/2011   CHOL 143 03/25/2011   TRIG 69.0 03/25/2011   HDL 37.30* 03/25/2011   LDLCALC 92 03/25/2011   ALT 22 12/20/2010   AST 21 12/20/2010   NA 141 03/25/2011   K 3.9 03/25/2011   CL 107 03/25/2011   CREATININE 1.2 03/25/2011   BUN 17 03/25/2011   CO2 24 03/25/2011   TSH  05/10/2009   INR 1.03 10/18/2009   HGBA1C  Value: 5.5 (NOTE) The ADA recommends the following therapeutic goal for glycemic control related to Hgb A1c measurement: Goal of therapy: <6.5 Hgb A1c  Reference: American Diabetes Association: Clinical Practice Recommendations 2010, Diabetes Care, 2010, 33: (Suppl  1). 05/10/2009      Assessment / Plan:     Patient with previous history of smoking who is remained tobacco free since 2010 at the time of his bypass surgery Painful sternal wire to palpation and when lifting right arm without evidence of sternal instability I discussed with the patient the symptoms and findings and have recommended removing 2 or 3 sternal wires to see if this helps his symptoms. The risks and options are discussed with him, and will require him to stop his Plavix for 4 days preop. We will plan sternal wire removal and following allow him to be discharged Tentatively planned for March 12, he has an appointment for his left ankle March 11  The goals risks and alternatives of the planned surgical procedure removal of painful sternal wires  have been discussed with the patient in detail. The risks of the procedure including death, infection, stroke, myocardial infarction,  bleeding, blood transfusion have all been discussed specifically.  I have quoted [name] a 1 % of perioperative mortality and a complication rate as high as 10 %. The patient's questions have been answered.[name] is willing  to proceed with the planned procedure.    [name] MD  Beeper [phone] Office [phone] 06/30/2011 2:25 PM

## 2011-06-30 NOTE — Patient Instructions (Signed)
Plan removal of wire, [facility] March 12

## 2011-07-02 ENCOUNTER — Encounter (HOSPITAL_COMMUNITY): Payer: Self-pay | Admitting: Pharmacy Technician

## 2011-07-03 ENCOUNTER — Ambulatory Visit: Payer: Medicaid Other | Admitting: Cardiothoracic Surgery

## 2011-07-04 ENCOUNTER — Encounter (HOSPITAL_COMMUNITY)
Admission: RE | Admit: 2011-07-04 | Discharge: 2011-07-04 | Disposition: A | Discharge status: Home / Self Care | Payer: Medicaid Other | Source: Ambulatory Visit | Attending: Cardiothoracic Surgery | Admitting: Cardiothoracic Surgery

## 2011-07-04 ENCOUNTER — Other Ambulatory Visit: Payer: Self-pay

## 2011-07-04 ENCOUNTER — Encounter (HOSPITAL_COMMUNITY): Payer: Self-pay

## 2011-07-04 DIAGNOSIS — R071 Chest pain on breathing: Secondary | ICD-10-CM

## 2011-07-04 LAB — CBC
HCT: 43.4 % (ref 39.0–52.0)
Hemoglobin: 14.9 g/dL (ref 13.0–17.0)
MCH: 28.2 pg (ref 26.0–34.0)
MCHC: 34.3 g/dL (ref 30.0–36.0)
MCV: 82 fL (ref 78.0–100.0)
Platelets: 175 10*3/uL (ref 150–400)
RBC: 5.29 MIL/uL (ref 4.22–5.81)
RDW: 13.4 % (ref 11.5–15.5)
WBC: 6.7 10*3/uL (ref 4.0–10.5)

## 2011-07-04 LAB — PROTIME-INR
INR: 1.05 (ref 0.00–1.49)
Prothrombin Time: 13.9 seconds (ref 11.6–15.2)

## 2011-07-04 LAB — COMPREHENSIVE METABOLIC PANEL
ALT: 20 U/L (ref 0–53)
AST: 24 U/L (ref 0–37)
Albumin: 4 g/dL (ref 3.5–5.2)
Alkaline Phosphatase: 80 U/L (ref 39–117)
BUN: 16 mg/dL (ref 6–23)
CO2: 18 mEq/L — ABNORMAL LOW (ref 19–32)
Calcium: 9.4 mg/dL (ref 8.4–10.5)
Chloride: 103 mEq/L (ref 96–112)
Creatinine, Ser: 1.05 mg/dL (ref 0.50–1.35)
GFR calc Af Amer: >90 mL/min (ref >90)
GFR calc non Af Amer: 82 mL/min — ABNORMAL LOW (ref >90)
Glucose, Bld: 148 mg/dL — ABNORMAL HIGH (ref 70–99)
Potassium: 3.7 mEq/L (ref 3.5–5.1)
Sodium: 137 mEq/L (ref 135–145)
Total Bilirubin: 0.4 mg/dL (ref 0.3–1.2)
Total Protein: 7.3 g/dL (ref 6.0–8.3)

## 2011-07-04 LAB — BLOOD GAS, ARTERIAL
Acid-base deficit: 2.6 mmol/L — ABNORMAL HIGH (ref 0.0–2.0)
Bicarbonate: 21.2 mEq/L (ref 20.0–24.0)
Drawn by: 344381
FIO2: 0.21 %
O2 Saturation: 96.7 %
Patient temperature: 98.6
TCO2: 22.2 mmol/L (ref 0–100)
pCO2 arterial: 33.5 mmHg — ABNORMAL LOW (ref 35.0–45.0)
pH, Arterial: 7.418 (ref 7.350–7.450)
pO2, Arterial: 78.4 mmHg — ABNORMAL LOW (ref 80.0–100.0)

## 2011-07-04 LAB — TYPE AND SCREEN
ABO/RH(D): A NEG
Antibody Screen: NEGATIVE

## 2011-07-04 LAB — URINALYSIS, ROUTINE W REFLEX MICROSCOPIC
Bilirubin Urine: NEGATIVE
Glucose, UA: NEGATIVE mg/dL
Hgb urine dipstick: NEGATIVE
Ketones, ur: NEGATIVE mg/dL
Leukocytes, UA: NEGATIVE
Nitrite: NEGATIVE
Protein, ur: NEGATIVE mg/dL
Specific Gravity, Urine: 1.016 (ref 1.005–1.030)
Urobilinogen, UA: 1 mg/dL (ref 0.0–1.0)
pH: 5.5 (ref 5.0–8.0)

## 2011-07-04 LAB — SURGICAL PCR SCREEN
MRSA, PCR: NEGATIVE
Staphylococcus aureus: NEGATIVE

## 2011-07-04 LAB — APTT: aPTT: 29 seconds (ref 24–37)

## 2011-07-04 NOTE — Pre-Procedure Instructions (Signed)
20 TARYLL REICHENBERGER  07/04/2011   Your procedure is scheduled on:  Tuesday, March 12  Report to [facility] at 8:30 AM.  Call this number if you have problems the morning of surgery: [phone]   Remember:   Do not eat food:After Midnight.  May have clear liquids: up to 4 Hours before arrival.  Clear liquids include soda, tea, black coffee, apple or grape juice, broth.  Take these medicines the morning of surgery with A SIP OF WATER: Norco if needed, Spiriva   Do not wear jewelry, make-up or nail polish.  Do not wear lotions, powders, or perfumes. You may wear deodorant.  Do not shave 48 hours prior to surgery.  Do not bring valuables to the hospital.  Contacts, dentures or bridgework may not be worn into surgery.  Leave suitcase in the car. After surgery it may be brought to your room.  For patients admitted to the hospital, checkout time is 11:00 AM the day of discharge.   Patients discharged the day of surgery will not be allowed to drive home.  Name and phone number of your driver: BOBBY  Special Instructions: CHG Shower Use Special Wash: 1/2 bottle night before surgery and 1/2 bottle morning of surgery.   Please read over the following fact sheets that you were given: Pain Booklet, Coughing and Deep Breathing, Blood Transfusion Information and Surgical Site Infection Prevention

## 2011-07-07 MED ORDER — CEFUROXIME SODIUM 1.5 G IJ SOLR
1.5000 g | INTRAMUSCULAR | Status: AC
  Administered 2011-07-08: 1.5 g via INTRAVENOUS
  Filled 2011-07-07: qty 1.5

## 2011-07-08 ENCOUNTER — Encounter (HOSPITAL_COMMUNITY): Payer: Self-pay | Admitting: *Deleted

## 2011-07-08 ENCOUNTER — Ambulatory Visit (HOSPITAL_COMMUNITY): Payer: Medicaid Other | Admitting: Anesthesiology

## 2011-07-08 ENCOUNTER — Encounter (HOSPITAL_COMMUNITY)
Admission: RE | Disposition: A | Discharge status: Home / Self Care | Payer: Self-pay | Source: Ambulatory Visit | Attending: Cardiothoracic Surgery

## 2011-07-08 ENCOUNTER — Ambulatory Visit (HOSPITAL_COMMUNITY)
Admission: RE | Admit: 2011-07-08 | Discharge: 2011-07-08 | Disposition: A | Discharge status: Home / Self Care | Payer: Medicaid Other | Source: Ambulatory Visit | Attending: Cardiothoracic Surgery | Admitting: Cardiothoracic Surgery

## 2011-07-08 ENCOUNTER — Encounter (HOSPITAL_COMMUNITY): Payer: Self-pay | Admitting: Anesthesiology

## 2011-07-08 DIAGNOSIS — I2510 Atherosclerotic heart disease of native coronary artery without angina pectoris: Secondary | ICD-10-CM | POA: Insufficient documentation

## 2011-07-08 DIAGNOSIS — Z01818 Encounter for other preprocedural examination: Secondary | ICD-10-CM | POA: Insufficient documentation

## 2011-07-08 DIAGNOSIS — R0602 Shortness of breath: Secondary | ICD-10-CM | POA: Insufficient documentation

## 2011-07-08 DIAGNOSIS — Z01810 Encounter for preprocedural cardiovascular examination: Secondary | ICD-10-CM | POA: Insufficient documentation

## 2011-07-08 DIAGNOSIS — R071 Chest pain on breathing: Secondary | ICD-10-CM

## 2011-07-08 DIAGNOSIS — I10 Essential (primary) hypertension: Secondary | ICD-10-CM | POA: Insufficient documentation

## 2011-07-08 DIAGNOSIS — T82218A Other mechanical complication of coronary artery bypass graft, initial encounter: Secondary | ICD-10-CM

## 2011-07-08 DIAGNOSIS — Z01812 Encounter for preprocedural laboratory examination: Secondary | ICD-10-CM | POA: Insufficient documentation

## 2011-07-08 DIAGNOSIS — Y832 Surgical operation with anastomosis, bypass or graft as the cause of abnormal reaction of the patient, or of later complication, without mention of misadventure at the time of the procedure: Secondary | ICD-10-CM | POA: Insufficient documentation

## 2011-07-08 DIAGNOSIS — Y92009 Unspecified place in unspecified non-institutional (private) residence as the place of occurrence of the external cause: Secondary | ICD-10-CM | POA: Insufficient documentation

## 2011-07-08 DIAGNOSIS — R0789 Other chest pain: Secondary | ICD-10-CM

## 2011-07-08 DIAGNOSIS — IMO0002 Reserved for concepts with insufficient information to code with codable children: Secondary | ICD-10-CM | POA: Insufficient documentation

## 2011-07-08 DIAGNOSIS — Z951 Presence of aortocoronary bypass graft: Secondary | ICD-10-CM | POA: Insufficient documentation

## 2011-07-08 DIAGNOSIS — I252 Old myocardial infarction: Secondary | ICD-10-CM | POA: Insufficient documentation

## 2011-07-08 DIAGNOSIS — Z87891 Personal history of nicotine dependence: Secondary | ICD-10-CM | POA: Insufficient documentation

## 2011-07-08 HISTORY — PX: STERNAL WIRES REMOVAL: SHX2441

## 2011-07-08 SURGERY — REMOVAL, STERNAL WIRE
Anesthesia: General | Site: Chest | Wound class: Clean

## 2011-07-08 MED ORDER — HYDROCODONE-ACETAMINOPHEN 5-500 MG PO TABS: 1.0000 | ORAL_TABLET | Freq: Four times a day (QID) | ORAL | Status: AC | PRN

## 2011-07-08 MED ORDER — ONDANSETRON HCL 4 MG/2ML IJ SOLN: 4.0000 mg | Freq: Four times a day (QID) | INTRAMUSCULAR | Status: DC | PRN

## 2011-07-08 MED ORDER — LACTATED RINGERS IV SOLN
INTRAVENOUS | Status: DC
  Administered 2011-07-08: 13:00:00 via INTRAVENOUS

## 2011-07-08 MED ORDER — LACTATED RINGERS IV SOLN
INTRAVENOUS | Status: DC | PRN
  Administered 2011-07-08 (×2): via INTRAVENOUS

## 2011-07-08 MED ORDER — EPHEDRINE SULFATE 50 MG/ML IJ SOLN
INTRAMUSCULAR | Status: DC | PRN
  Administered 2011-07-08 (×2): 10 mg via INTRAVENOUS

## 2011-07-08 MED ORDER — CEFUROXIME SODIUM 1.5 G IJ SOLR
INTRAMUSCULAR | Status: AC
  Filled 2011-07-08: qty 1.5

## 2011-07-08 MED ORDER — MIDAZOLAM HCL 5 MG/5ML IJ SOLN
INTRAMUSCULAR | Status: DC | PRN
  Administered 2011-07-08: 2 mg via INTRAVENOUS

## 2011-07-08 MED ORDER — FENTANYL CITRATE 0.05 MG/ML IJ SOLN
INTRAMUSCULAR | Status: DC | PRN
  Administered 2011-07-08: 150 ug via INTRAVENOUS

## 2011-07-08 MED ORDER — FENTANYL CITRATE 0.05 MG/ML IJ SOLN
25.0000 ug | INTRAMUSCULAR | Status: DC | PRN
  Administered 2011-07-08: 25 ug via INTRAVENOUS
  Administered 2011-07-08: 50 ug via INTRAVENOUS
  Administered 2011-07-08: 25 ug via INTRAVENOUS

## 2011-07-08 MED ORDER — DEXTROSE 5 % IV SOLN
INTRAVENOUS | Status: AC
  Filled 2011-07-08: qty 50

## 2011-07-08 MED ORDER — HYDROCODONE-ACETAMINOPHEN 5-325 MG PO TABS
1.0000 | ORAL_TABLET | Freq: Four times a day (QID) | ORAL | Status: DC | PRN
  Administered 2011-07-08: 1 via ORAL

## 2011-07-08 MED ORDER — LIDOCAINE HCL (PF) 1 % IJ SOLN
INTRAMUSCULAR | Status: DC | PRN
  Administered 2011-07-08: 30 mL

## 2011-07-08 MED ORDER — PROPOFOL 10 MG/ML IV EMUL
INTRAVENOUS | Status: DC | PRN
  Administered 2011-07-08: 200 mg via INTRAVENOUS

## 2011-07-08 SURGICAL SUPPLY — 63 items
APL SKNCLS STERI-STRIP NONHPOA (GAUZE/BANDAGES/DRESSINGS)
ATTRACTOMAT 16X20 MAGNETIC DRP (DRAPES) IMPLANT
BAG DECANTER FOR FLEXI CONT (MISCELLANEOUS) ×2 IMPLANT
BAG URIMETER BARDEX IC 350 (UROLOGICAL SUPPLIES) IMPLANT
BANDAGE GAUZE ELAST BULKY 4 IN (GAUZE/BANDAGES/DRESSINGS) IMPLANT
BENZOIN TINCTURE PRP APPL 2/3 (GAUZE/BANDAGES/DRESSINGS) IMPLANT
BLADE SURG 10 STRL SS (BLADE) ×2 IMPLANT
CANISTER SUCTION 2500CC (MISCELLANEOUS) ×2 IMPLANT
CATH FOLEY 2WAY SLVR  5CC 16FR (CATHETERS)
CATH FOLEY 2WAY SLVR 5CC 16FR (CATHETERS) IMPLANT
CATH THORACIC 28FR RT ANG (CATHETERS) IMPLANT
CATH THORACIC 36FR (CATHETERS) IMPLANT
CATH THORACIC 36FR RT ANG (CATHETERS) IMPLANT
CLIP TI WIDE RED SMALL 24 (CLIP) IMPLANT
CLOTH BEACON ORANGE TIMEOUT ST (SAFETY) ×2 IMPLANT
CONN Y 3/8X3/8X3/8  BEN (MISCELLANEOUS) ×1
CONN Y 3/8X3/8X3/8 BEN (MISCELLANEOUS) ×1 IMPLANT
CONT SPEC 4OZ CLIKSEAL STRL BL (MISCELLANEOUS) IMPLANT
DRAPE LAPAROSCOPIC ABDOMINAL (DRAPES) ×2 IMPLANT
DRAPE SLUSH MACHINE 52X66 (DRAPES) IMPLANT
DRAPE WARM FLUID 44X44 (DRAPE) IMPLANT
DRSG PAD ABDOMINAL 8X10 ST (GAUZE/BANDAGES/DRESSINGS) IMPLANT
ELECT REM PT RETURN 9FT ADLT (ELECTROSURGICAL) ×2
ELECTRODE REM PT RTRN 9FT ADLT (ELECTROSURGICAL) ×1 IMPLANT
GAUZE SPONGE 4X4 12PLY STRL LF (GAUZE/BANDAGES/DRESSINGS) ×2 IMPLANT
GAUZE XEROFORM 5X9 LF (GAUZE/BANDAGES/DRESSINGS) IMPLANT
GLOVE BIO SURGEON STRL SZ 6.5 (GLOVE) ×4 IMPLANT
GOWN STRL NON-REIN LRG LVL3 (GOWN DISPOSABLE) ×4 IMPLANT
HANDPIECE INTERPULSE COAX TIP (DISPOSABLE)
HEMOSTAT POWDER SURGIFOAM 1G (HEMOSTASIS) IMPLANT
HEMOSTAT SURGICEL 2X14 (HEMOSTASIS) IMPLANT
KIT BASIN OR (CUSTOM PROCEDURE TRAY) ×2 IMPLANT
KIT ROOM TURNOVER OR (KITS) ×2 IMPLANT
KIT SUCTION CATH 14FR (SUCTIONS) IMPLANT
MARKER SKIN DUAL TIP RULER LAB (MISCELLANEOUS) IMPLANT
NS IRRIG 1000ML POUR BTL (IV SOLUTION) ×2 IMPLANT
PACK CHEST (CUSTOM PROCEDURE TRAY) ×2 IMPLANT
PAD ARMBOARD 7.5X6 YLW CONV (MISCELLANEOUS) ×4 IMPLANT
PIN SAFETY STERILE (MISCELLANEOUS) IMPLANT
RUBBERBAND STERILE (MISCELLANEOUS) IMPLANT
SET HNDPC FAN SPRY TIP SCT (DISPOSABLE) IMPLANT
SOLUTION BETADINE 4OZ (MISCELLANEOUS) IMPLANT
SPONGE GAUZE 4X4 12PLY (GAUZE/BANDAGES/DRESSINGS) ×2 IMPLANT
SPONGE LAP 18X18 X RAY DECT (DISPOSABLE) IMPLANT
STAPLER VISISTAT 35W (STAPLE) IMPLANT
STRAP MONTGOMERY 1.25X11-1/8 (MISCELLANEOUS) IMPLANT
SUT ETHILON 3 0 FSL (SUTURE) IMPLANT
SUT STEEL 6MS V (SUTURE) IMPLANT
SUT STEEL STERNAL CCS#1 18IN (SUTURE) IMPLANT
SUT STEEL SZ 6 DBL 3X14 BALL (SUTURE) IMPLANT
SUT VIC AB 1 CTX 36 (SUTURE)
SUT VIC AB 1 CTX36XBRD ANBCTR (SUTURE) IMPLANT
SUT VIC AB 2-0 CTX 27 (SUTURE) IMPLANT
SUT VIC AB 3-0 SH 8-18 (SUTURE) ×2 IMPLANT
SUT VIC AB 3-0 X1 27 (SUTURE) ×2 IMPLANT
SWAB COLLECTION DEVICE MRSA (MISCELLANEOUS) IMPLANT
SYR 5ML LL (SYRINGE) IMPLANT
SYSTEM SAHARA CHEST DRAIN ATS (WOUND CARE) IMPLANT
TAPE CLOTH SURG 4X10 WHT LF (GAUZE/BANDAGES/DRESSINGS) ×2 IMPLANT
TOWEL OR 17X24 6PK STRL BLUE (TOWEL DISPOSABLE) ×2 IMPLANT
TOWEL OR 17X26 10 PK STRL BLUE (TOWEL DISPOSABLE) ×2 IMPLANT
TUBE ANAEROBIC SPECIMEN COL (MISCELLANEOUS) IMPLANT
WATER STERILE IRR 1000ML POUR (IV SOLUTION) ×2 IMPLANT

## 2011-07-08 NOTE — Progress Notes (Signed)
[address]            [place] [address]          [phone]      [name] [facility] Medical Record #[id number] Date of Birth: July 21, 1960  Referring:  Dr Becky Augusta Primary Care: Redmond Baseman, MD, MD  Chief Complaint:    Chief Complaint  Patient presents with  . Painful sternal wire    CXR, S/P cabg in 2010    History of Present Illness:    Patient is a 51 year old male who underwent urgent coronary artery bypass grafting x5 after suffering an acute anterior myocardial infarction 02/13/2009. Prior to surgery the patient had been a long-term smoker he has not smoked any more since the date of his surgery. Approximately a year and a half after his surgery he noted discomfort in the middle part of his chest when he lifted his right arm. Over the past several months this has become worse to the point where he avoids lifting his right arm. He can point to the middle portion of his sternum as the area that is most tender and tender to palpation.   Since surgery the patient had an injury to his left leg/ankle and this required several orthopedic procedures for this.  Current Activity/ Functional Status: Patient is independent with mobility/ambulation, transfers, ADL's, IADL's.   Past Medical History  Diagnosis Date  . Coronary artery disease     STATUS POST CABG 01/2009  . 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 Dyslipidemia   . Hypertension   . Dyspnea   . Orthostatic hypotension   . Syncope and collapse   . Chest pain   . Myocardial infarction     Past Surgical History  Procedure Date  . Cardiac catheterization 10/19/2009  . Ankle surgery     x 2  . Coronary artery bypass graft     Family History  Problem Relation Age of Onset  . Coronary artery disease Father     History   Social History  . Marital Status: Married    Spouse Name: N/A    Number of Children: N/A  . Years of Education: N/A   Occupational History  .  Not on file.   Social History Main Topics  . Smoking status: Former Smoker -- 3.0 packs/day for 30 years    Types: Cigarettes, Cigars    Quit date: 01/26/2009  . Smokeless tobacco: Never Used  . Alcohol Use: No  . Drug Use: No  . Sexually Active: Not on file   Other Topics Concern  . Not on file   Social History Narrative  . No narrative on file    History  Smoking status  . Former Smoker -- 3.0 packs/day for 30 years  . Types: Cigarettes, Cigars  . Quit date: 01/26/2009  Smokeless tobacco  . Never Used    History  Alcohol Use No     No Known Allergies  Current Facility-Administered Medications  Medication Dose Route Frequency Provider Last Rate Last Dose  . cefUROXime (ZINACEF) 1.5 g in dextrose 5 % 50 mL IVPB  1.5 g Intravenous 60 min Pre-Op [name], MD      . lactated ringers infusion   Intravenous Continuous Raiford Simmonds, MD 50 mL/hr at 07/08/11 1245         Review of Systems:  Cardiac Review of Systems: Y or N  Chest Pain [  Chest wire pain not angina  ]  Resting SOB [ n  ] Exertional SOB  [n  ]  Orthopnea [ n ]   Pedal Edema [ n  ]    Palpitations [n  ] Syncope  [ n ]   Presyncope [n  ]  General Review of Systems: [Y] = yes [  ]=no Constitional: recent weight change [n  ]; anorexia [  ]; fatigue [  ]; nausea [  ]; night sweats [  ]; fever [  ]; or chills [  ];                                                                                                                                          Dental: poor dentition[  ];   Eye : blurred vision [  ]; diplopia [   ]; vision changes [  ];  Amaurosis fugax[  ]; Resp: cough [  ];  wheezing[ n ];  hemoptysis[  ]; shortness of breath[  ]; paroxysmal nocturnal dyspnea[ n ]; dyspnea on exertion[n  ]; or orthopnea[  ];  GI:  gallstones[  ], vomiting[  ];  dysphagia[  ]; melena[  ];  hematochezia [  ]; heartburn[  ];   Hx of  Colonoscopy[  ]; GU: kidney stones [  ]; hematuria[  ];   dysuria [  ];   nocturia[  ];  history of     obstruction [  ];             Skin: rash, swelling[  ];, hair loss[  ];  peripheral edema[  ];  or itching[  ]; Musculosketetal: myalgias[  ];  joint swelling[  ];  joint erythema[  ];  joint pain[ left ankle ];  back pain[  ];  Heme/Lymph: bruising[  ];  bleeding[ n ];  anemia[  ];  Neuro: TIA[  ];  headaches[  ];  stroke[  ];  vertigo[  ];  seizures[ n ];   paresthesias[ n ];  difficulty walking[ y ];  Psych:depression[  ]; anxiety[  ];  Endocrine: diabetes[  ];  thyroid dysfunction[  ];  Immunizations: Flu [ ? ]; Pneumococcal[?  ];  Other:  Physical Exam: BP 124/87  Pulse 65  Temp(Src) 98 F (36.7 C) (Oral)  Resp 20  SpO2 97%  General appearance: alert, cooperative and no distress Neurologic: intact Heart: regular rate and rhythm, S1, S2 normal, no murmur, click, rub or gallop Lungs: clear to auscultation bilaterally Abdomen: soft, non-tender; bowel sounds normal; no masses,  no organomegaly Extremities: extremities normal, atraumatic, no cyanosis or edema, Homans sign is negative, no sign of DVT and no edema, redness or tenderness in the calves or thighs, previos procedures on left ankle Wound: sternal incision is well healed, 4 and 5 wires from  top are the most tenter on palpation, bone appears intact on palpation Painful wires have been marked in holding area  Diagnostic Studies & Laboratory data:     Recent Radiology Findings:  Dg Chest 2 View  07/04/2011  *RADIOLOGY REPORT*  Clinical Data: Preadmission for sternal wire removal.  Status post CABG.  Cough and congestion.  Hypertension.  Myocardial infarction.  CHEST - 2 VIEW  Comparison: 06/30/2011  Findings: Mild hyperinflation. Prior median sternotomy. Midline trachea.  Borderline cardiomegaly.     Mediastinal contours otherwise within normal limits.  No pleural effusion or pneumothorax.  Clear lungs.  IMPRESSION: Hyperinflation and borderline cardiomegaly. No acute findings.  Original Report  Authenticated By: Consuello Bossier, M.D.   Dg Chest 2 View  06/30/2011  *RADIOLOGY REPORT*  Clinical Data: Post CABG in 2010, sternal discomfort  CHEST - 2 VIEW  Comparison: CT chest of 02/10/2011 and chest x-ray of 10/04/2010  Findings: The lungs are clear.  Cardiomegaly is stable.  Median sternotomy sutures appear intact.  No bony abnormality is seen.  IMPRESSION: Stable chest x-ray with mild cardiomegaly.  No active lung disease.  Original Report Authenticated By: Juline Patch, M.D.     Recent Lab Findings: Lab Results  Component Value Date   WBC 8.1 03/28/2010   HGB 14.5 07/19/2010   HCT 46.7 03/28/2010   PLT 194 03/28/2010   GLUCOSE 104* 03/25/2011   CHOL 143 03/25/2011   TRIG 69.0 03/25/2011   HDL 37.30* 03/25/2011   LDLCALC 92 03/25/2011   ALT 22 12/20/2010   AST 21 12/20/2010   NA 141 03/25/2011   K 3.9 03/25/2011   CL 107 03/25/2011   CREATININE 1.2 03/25/2011   BUN 17 03/25/2011   CO2 24 03/25/2011   TSH  05/10/2009   INR 1.03 10/18/2009   HGBA1C  Value: 5.5 (NOTE) The ADA recommends the following therapeutic goal for glycemic control related to Hgb A1c measurement: Goal of therapy: <6.5 Hgb A1c  Reference: American Diabetes Association: Clinical Practice Recommendations 2010, Diabetes Care, 2010, 33: (Suppl  1). 05/10/2009      Assessment / Plan:     Patient with previous history of smoking who is remained tobacco free since 2010 at the time of his bypass surgery Painful sternal wire to palpation and when lifting right arm without evidence of sternal instability I discussed with the patient the symptoms and findings and have recommended removing 2 or 3 sternal wires to see if this helps his symptoms. The risks and options are discussed with him, and will require him to stop his Plavix for 4 days preop. We will plan sternal wire removal and following allow him to be discharged The goals risks and alternatives of the planned surgical procedure removal of painful sternal wires   have been discussed with the patient in detail. The risks of the procedure including death, infection, stroke, myocardial infarction, bleeding, blood transfusion have all been discussed specifically.  I have quoted [name] a 1 % of perioperative mortality and a complication rate as high as 10 %. The patient's questions have been answered.[name] is willing  to proceed with the planned procedure.    [name] MD  Beeper [phone] Office [phone] 07/08/2011 1:21 PM

## 2011-07-08 NOTE — Brief Op Note (Signed)
07/08/2011  3:00 PM  PATIENT:  [name]  51 y.o. male  PRE-OPERATIVE DIAGNOSIS:  PAINFUL STERNAL WIRE  POST-OPERATIVE DIAGNOSIS:  PAINFUL STERNAL WIRE  PROCEDURE:  Procedure(s) (LRB): STERNAL WIRE REMOVAL (N/A)  SURGEON:  Surgeon(s) and Role:    * [name], MD - Primary     ANESTHESIA:   local and general  EBL:  Total I/O In: 1500 [I.V.:1500] Out: -   BLOOD ADMINISTERED:none  DRAINS: none   LOCAL MEDICATIONS USED: 10cc 1% lidocaine  COUNTS:  YES   DICTATION: .Other Dictation: Dictation Number   PLAN OF CARE: Discharge to home after PACU  PATIENT DISPOSITION:  PACU - hemodynamically stable.

## 2011-07-08 NOTE — H&P (Signed)
[address]            [place] [address]          [phone]      [name] [facility] Medical Record #[id number] Date of Birth: [birth year]  Referring:  Dr Becky Augusta Primary Care: Redmond Baseman, MD, MD  Chief Complaint:    Chief Complaint  Patient presents with  . Painful sternal wire    CXR, S/P cabg in 2010    History of Present Illness:    Patient is a 51 year old male who underwent urgent coronary artery bypass grafting x5 after suffering an acute anterior myocardial infarction 02/13/2009. Prior to surgery the patient had been a long-term smoker he has not smoked any more since the date of his surgery. Approximately a year and a half after his surgery he noted discomfort in the middle part of his chest when he lifted his right arm. Over the past several months this has become worse to the point where he avoids lifting his right arm. He can point to the middle portion of his sternum as the area that is most tender and tender to palpation.   Since surgery the patient had an injury to his left leg/ankle and this required several orthopedic procedures for this.  Current Activity/ Functional Status: Patient is independent with mobility/ambulation, transfers, ADL's, IADL's.   Past Medical History  Diagnosis Date  . Coronary artery disease     STATUS POST CABG 01/2009  . 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 Dyslipidemia   . Hypertension   . Dyspnea   . Orthostatic hypotension   . Syncope and collapse   . Chest pain   . Myocardial infarction     Past Surgical History  Procedure Date  . Cardiac catheterization 10/19/2009  . Ankle surgery     x 2  . Coronary artery bypass graft     Family History  Problem Relation Age of Onset  . Coronary artery disease Father     History   Social History  . Marital Status: Married    Spouse Name: N/A    Number of Children: N/A  . Years of Education: N/A   Occupational History  .  Not on file.   Social History Main Topics  . Smoking status: Former Smoker -- 3.0 packs/day for 30 years    Types: Cigarettes, Cigars    Quit date: 01/26/2009  . Smokeless tobacco: Never Used  . Alcohol Use: No  . Drug Use: No  . Sexually Active: Not on file   Other Topics Concern  . Not on file   Social History Narrative  . No narrative on file    History  Smoking status  . Former Smoker -- 3.0 packs/day for 30 years  . Types: Cigarettes, Cigars  . Quit date: 01/26/2009  Smokeless tobacco  . Never Used    History  Alcohol Use No     No Known Allergies  Current Facility-Administered Medications  Medication Dose Route Frequency Provider Last Rate Last Dose  . cefUROXime (ZINACEF) 1.5 g in dextrose 5 % 50 mL IVPB  1.5 g Intravenous 60 min Pre-Op [name], MD      . lactated ringers infusion   Intravenous Continuous Raiford Simmonds, MD 50 mL/hr at 07/08/11 1245         Review of Systems:  Cardiac Review of Systems: Y or N  Chest Pain [  Chest wire pain not angina  ]  Resting SOB [ n  ] Exertional SOB  [n  ]  Orthopnea [ n ]   Pedal Edema [ n  ]    Palpitations [n  ] Syncope  [ n ]   Presyncope [n  ]  General Review of Systems: [Y] = yes [  ]=no Constitional: recent weight change [n  ]; anorexia [  ]; fatigue [  ]; nausea [  ]; night sweats [  ]; fever [  ]; or chills [  ];                                                                                                                                          Dental: poor dentition[  ];   Eye : blurred vision [  ]; diplopia [   ]; vision changes [  ];  Amaurosis fugax[  ]; Resp: cough [  ];  wheezing[ n ];  hemoptysis[  ]; shortness of breath[  ]; paroxysmal nocturnal dyspnea[ n ]; dyspnea on exertion[n  ]; or orthopnea[  ];  GI:  gallstones[  ], vomiting[  ];  dysphagia[  ]; melena[  ];  hematochezia [  ]; heartburn[  ];   Hx of  Colonoscopy[  ]; GU: kidney stones [  ]; hematuria[  ];   dysuria [  ];   nocturia[  ];  history of     obstruction [  ];             Skin: rash, swelling[  ];, hair loss[  ];  peripheral edema[  ];  or itching[  ]; Musculosketetal: myalgias[  ];  joint swelling[  ];  joint erythema[  ];  joint pain[ left ankle ];  back pain[  ];  Heme/Lymph: bruising[  ];  bleeding[ n ];  anemia[  ];  Neuro: TIA[  ];  headaches[  ];  stroke[  ];  vertigo[  ];  seizures[ n ];   paresthesias[ n ];  difficulty walking[ y ];  Psych:depression[  ]; anxiety[  ];  Endocrine: diabetes[  ];  thyroid dysfunction[  ];  Immunizations: Flu [ ? ]; Pneumococcal[?  ];  Other:  Physical Exam: BP 124/87  Pulse 65  Temp(Src) 98 F (36.7 C) (Oral)  Resp 20  SpO2 97%  General appearance: alert, cooperative and no distress Neurologic: intact Heart: regular rate and rhythm, S1, S2 normal, no murmur, click, rub or gallop Lungs: clear to auscultation bilaterally Abdomen: soft, non-tender; bowel sounds normal; no masses,  no organomegaly Extremities: extremities normal, atraumatic, no cyanosis or edema, Homans sign is negative, no sign of DVT and no edema, redness or tenderness in the calves or thighs, previos procedures on left ankle Wound: sternal incision is well healed, 4 and 5 wires from  top are the most tenter on palpation, bone appears intact on palpation Painful wires have been marked in holding area  Diagnostic Studies & Laboratory data:     Recent Radiology Findings:  Dg Chest 2 View  07/04/2011  *RADIOLOGY REPORT*  Clinical Data: Preadmission for sternal wire removal.  Status post CABG.  Cough and congestion.  Hypertension.  Myocardial infarction.  CHEST - 2 VIEW  Comparison: 06/30/2011  Findings: Mild hyperinflation. Prior median sternotomy. Midline trachea.  Borderline cardiomegaly.     Mediastinal contours otherwise within normal limits.  No pleural effusion or pneumothorax.  Clear lungs.  IMPRESSION: Hyperinflation and borderline cardiomegaly. No acute findings.  Original Report  Authenticated By: Consuello Bossier, M.D.   Dg Chest 2 View  06/30/2011  *RADIOLOGY REPORT*  Clinical Data: Post CABG in 2010, sternal discomfort  CHEST - 2 VIEW  Comparison: CT chest of 02/10/2011 and chest x-ray of 10/04/2010  Findings: The lungs are clear.  Cardiomegaly is stable.  Median sternotomy sutures appear intact.  No bony abnormality is seen.  IMPRESSION: Stable chest x-ray with mild cardiomegaly.  No active lung disease.  Original Report Authenticated By: Juline Patch, M.D.     Recent Lab Findings: Lab Results  Component Value Date   WBC 8.1 03/28/2010   HGB 14.5 07/19/2010   HCT 46.7 03/28/2010   PLT 194 03/28/2010   GLUCOSE 104* 03/25/2011   CHOL 143 03/25/2011   TRIG 69.0 03/25/2011   HDL 37.30* 03/25/2011   LDLCALC 92 03/25/2011   ALT 22 12/20/2010   AST 21 12/20/2010   NA 141 03/25/2011   K 3.9 03/25/2011   CL 107 03/25/2011   CREATININE 1.2 03/25/2011   BUN 17 03/25/2011   CO2 24 03/25/2011   TSH  05/10/2009   INR 1.03 10/18/2009   HGBA1C  Value: 5.5 (NOTE) The ADA recommends the following therapeutic goal for glycemic control related to Hgb A1c measurement: Goal of therapy: <6.5 Hgb A1c  Reference: American Diabetes Association: Clinical Practice Recommendations 2010, Diabetes Care, 2010, 33: (Suppl  1). 05/10/2009      Assessment / Plan:     Patient with previous history of smoking who is remained tobacco free since 2010 at the time of his bypass surgery Painful sternal wire to palpation and when lifting right arm without evidence of sternal instability I discussed with the patient the symptoms and findings and have recommended removing 2 or 3 sternal wires to see if this helps his symptoms. The risks and options are discussed with him, and will require him to stop his Plavix for 4 days preop. We will plan sternal wire removal and following allow him to be discharged The goals risks and alternatives of the planned surgical procedure removal of painful sternal wires   have been discussed with the patient in detail. The risks of the procedure including death, infection, stroke, myocardial infarction, bleeding, blood transfusion have all been discussed specifically.  I have quoted [name] a 1 % of perioperative mortality and a complication rate as high as 10 %. The patient's questions have been answered.[name] is willing  to proceed with the planned procedure.    [name] MD  Beeper [phone] Office [phone] 07/08/2011 1:23 PM

## 2011-07-08 NOTE — Anesthesia Postprocedure Evaluation (Signed)
Anesthesia Post Note  Patient: [name]  Procedure(s) Performed: Procedure(s) (LRB): STERNAL WIRES REMOVAL (N/A)  Anesthesia type: General  Patient location: PACU  Post pain: Pain level controlled and Adequate analgesia  Post assessment: Post-op Vital signs reviewed, Patient's Cardiovascular Status Stable, Respiratory Function Stable, Patent Airway and Pain level controlled  Last Vitals:  Filed Vitals:   07/08/11 1510  BP:   Pulse:   Temp: 36.4 C  Resp:     Post vital signs: Reviewed and stable  Level of consciousness: awake, alert  and oriented  Complications: No apparent anesthesia complications

## 2011-07-08 NOTE — Anesthesia Preprocedure Evaluation (Signed)
Anesthesia Evaluation  Patient identified by MRN, date of birth, ID band Patient awake    Reviewed: Allergy & Precautions, H&P , NPO status , Patient's Chart, lab work & pertinent test results  Airway Mallampati: II  Neck ROM: full    Dental   Pulmonary shortness of breath,          Cardiovascular hypertension, + CAD and + Past MI     Neuro/Psych    GI/Hepatic   Endo/Other    Renal/GU      Musculoskeletal   Abdominal   Peds  Hematology   Anesthesia Other Findings   Reproductive/Obstetrics                           Anesthesia Physical Anesthesia Plan  ASA: III  Anesthesia Plan: MAC   Post-op Pain Management:    Induction: Intravenous  Airway Management Planned: Simple Face Mask  Additional Equipment:   Intra-op Plan:   Post-operative Plan:   Informed Consent: I have reviewed the patients History and Physical, chart, labs and discussed the procedure including the risks, benefits and alternatives for the proposed anesthesia with the patient or authorized representative who has indicated his/her understanding and acceptance.     Plan Discussed with: CRNA and Surgeon  Anesthesia Plan Comments:         Anesthesia Quick Evaluation

## 2011-07-08 NOTE — Transfer of Care (Signed)
Immediate Anesthesia Transfer of Care Note  Patient: [name]  Procedure(s) Performed: Procedure(s) (LRB): STERNAL WIRES REMOVAL (N/A)  Patient Location: PACU  Anesthesia Type: General  Level of Consciousness: awake, alert  and oriented  Airway & Oxygen Therapy: Patient Spontanous Breathing and Patient connected to nasal cannula oxygen  Post-op Assessment: Report given to PACU RN, Post -op Vital signs reviewed and stable, Patient moving all extremities and Patient moving all extremities X 4  Post vital signs: Reviewed and stable  Complications: No apparent anesthesia complications

## 2011-07-08 NOTE — Discharge Instructions (Signed)
Keep wound dry 24 hours Resume your plavix No heavy lifting 25 lbs for week   Instructions Following General Anesthetic, Adult A nurse specialized in giving anesthesia (anesthetist) or a doctor specialized in giving anesthesia (anesthesiologist) gave you a medicine that made you sleep while a procedure was performed. For as long as 24 hours following this procedure, you may feel:  Dizzy.   Weak.   Drowsy.  AFTER THE PROCEDURE After surgery, you will be taken to the recovery area where a nurse will monitor your progress. You will be allowed to go home when you are awake, stable, taking fluids well, and without complications. For the first 24 hours following an anesthetic:  Have a responsible person with you.   Do not drive a car. If you are alone, do not take public transportation.   Do not drink alcohol.   Do not take medicine that has not been prescribed by your caregiver.   Do not sign important papers or make important decisions.   You may resume normal diet and activities as directed.   Change bandages (dressings) as directed.   Only take over-the-counter or prescription medicines for pain, discomfort, or fever as directed by your caregiver.  If you have questions or problems that seem related to the anesthetic, call the hospital and ask for the anesthetist or anesthesiologist on call. SEEK IMMEDIATE MEDICAL CARE IF:   You develop a rash.   You have difficulty breathing.   You have chest pain.   You develop any allergic problems.  Document Released: 07/21/2000 Document Revised: 04/03/2011 Document Reviewed: 03/01/2007 [facility] Patient Information 2012 [place], [name].

## 2011-07-09 NOTE — Op Note (Signed)
NAME:  [name], AVETISYAN NO.:  [id number]  MEDICAL RECORD NO.:  [id number]  LOCATION:  [facility]                         FACILITY:  [facility]  PHYSICIAN:  Sheliah Plane, MD    DATE OF BIRTH:  [birth year]  DATE OF PROCEDURE:  07/08/2011 DATE OF DISCHARGE:  07/08/2011                              OPERATIVE REPORT   PREOPERATIVE DIAGNOSIS:  Painful sternal wire following coronary artery bypass grafting.  POSTOPERATIVE DIAGNOSIS:  Painful sternal wire following coronary artery bypass grafting.  SURGICAL PROCEDURE:  Removal of painful sternal wire.  SURGEON:  Sheliah Plane, MD  BRIEF HISTORY:  The patient is a 51 year old male who 2 years previously had undergone coronary artery bypass grafting.  He notes that 6-8 months ago he began having tenderness over the midpart of his sternum and radiating to the right when he raised his right arm.  On physical examination, there was one discrete area of point tenderness that was reproducible with his symptoms and thought to be a painful sternal wire. Removal was recommend to the patient, who agreed and signed informed consent.  DESCRIPTION OF PROCEDURE:  With light general anesthesia with LMA, the patient's chest was prepped with Betadine and draped in sterile manner. Time-out was performed. An 1% lidocaine was infiltrated over the area that had been previously marked in the holding area as to identify the wire that was painful.  This was the fourth wire.  Below this wire, the patient was no longer tender, so we decided to remove only the fourth wire.  A small incision was made over the wire and dissection was carried down.  The wire was grasped and twisted slightly, cut and removed without difficulty.  The deep layers were closed with interrupted 3-0 Vicryl suture, a 4-0 subcuticular stitch in the skin edges and Dermabond was applied.  Blood loss was minimal.  Sponge and needle count was reported as correct.  The patient  tolerated the procedure without obvious complication, was extubated, and returned to the recovery room in good condition.     Sheliah Plane, MD     EG/MEDQ  D:  07/08/2011  T:  07/09/2011  Job:  [id number]

## 2011-07-10 ENCOUNTER — Ambulatory Visit (HOSPITAL_COMMUNITY): Admission: RE | Admit: 2011-07-10 | Payer: Medicaid Other | Source: Ambulatory Visit | Admitting: Gastroenterology

## 2011-07-10 ENCOUNTER — Encounter (HOSPITAL_COMMUNITY): Admission: RE | Payer: Self-pay | Source: Ambulatory Visit

## 2011-07-10 SURGERY — SIGMOIDOSCOPY, FLEXIBLE
Anesthesia: Moderate Sedation

## 2011-07-14 ENCOUNTER — Encounter (HOSPITAL_COMMUNITY): Payer: Self-pay | Admitting: Cardiothoracic Surgery

## 2011-07-15 ENCOUNTER — Other Ambulatory Visit: Payer: Self-pay | Admitting: Cardiothoracic Surgery

## 2011-07-15 DIAGNOSIS — R079 Chest pain, unspecified: Secondary | ICD-10-CM

## 2011-07-21 ENCOUNTER — Ambulatory Visit
Admission: RE | Admit: 2011-07-21 | Discharge: 2011-07-21 | Disposition: A | Discharge status: Home / Self Care | Payer: Medicaid Other | Source: Ambulatory Visit | Attending: Cardiothoracic Surgery | Admitting: Cardiothoracic Surgery

## 2011-07-21 ENCOUNTER — Ambulatory Visit (INDEPENDENT_AMBULATORY_CARE_PROVIDER_SITE_OTHER): Payer: Medicaid Other | Admitting: Surgical

## 2011-07-21 VITALS — BP 126/86 | HR 67 | Resp 20 | Ht 73.0 in | Wt 245.0 lb

## 2011-07-21 DIAGNOSIS — IMO0002 Reserved for concepts with insufficient information to code with codable children: Secondary | ICD-10-CM

## 2011-07-21 DIAGNOSIS — Z9889 Other specified postprocedural states: Secondary | ICD-10-CM

## 2011-07-21 DIAGNOSIS — R079 Chest pain, unspecified: Secondary | ICD-10-CM

## 2011-07-21 DIAGNOSIS — T8189XA Other complications of procedures, not elsewhere classified, initial encounter: Secondary | ICD-10-CM

## 2011-07-21 MED ORDER — OXYCODONE-ACETAMINOPHEN 5-325 MG PO TABS: 1.0000 | ORAL_TABLET | Freq: Four times a day (QID) | ORAL | Status: AC | PRN

## 2011-07-21 NOTE — Progress Notes (Signed)
  HPI:  Patient returns today's date approximately 12 days after his sternal wire removal. The fourth wire was removed at that time. He continues to have ongoing pain issues in the general region slightly above and slightly below where the wire was removed. He also relates that is difficult to raise his right arm to to pain.   Current Outpatient Prescriptions  Medication Sig Dispense Refill  . clopidogrel (PLAVIX) 75 MG tablet Take 75 mg by mouth daily.      . hydrochlorothiazide 25 MG tablet Take 25 mg by mouth daily.       Marland Kitchen losartan (COZAAR) 100 MG tablet Take 100 mg by mouth daily.      . potassium chloride (K-DUR) 10 MEQ tablet Take 10 mEq by mouth daily.      . rosuvastatin (CRESTOR) 20 MG tablet Take 20 mg by mouth at bedtime.      Marland Kitchen tiotropium (SPIRIVA) 18 MCG inhalation capsule Place 18 mcg into inhaler and inhale daily.       Marland Kitchen oxyCODONE-acetaminophen (ROXICET) 5-325 MG per tablet Take 1-2 tablets by mouth every 6 (six) hours as needed for pain.  30 tablet  0    Physical Exam Gen.-well-developed adult male in no acute distress Pulmonary exam-clear lung fields throughout Cardiac exam-S1-S2-normal, regular rate and rhythm  Diagnostic Tests: A chest x-ray was obtained on today's date. It reveals clear lung fields throughout. The only notable changes the removal of the fourth sternal wire.   Impression: Sternal pain  Plan: I gave the patient a prescription for an additional 30 tablets of Percocet. We will see how he does over the next several weeks regarding improvement of symptoms with conservative management. He may require removal of additional sternal wires in the future.

## 2011-07-21 NOTE — Patient Instructions (Signed)
Return to the office to see Dr. Tyrone Sage in approximately 3 weeks if continues to have pain.

## 2011-07-24 ENCOUNTER — Encounter (HOSPITAL_COMMUNITY): Payer: Self-pay | Admitting: Emergency Medicine

## 2011-07-24 ENCOUNTER — Emergency Department (HOSPITAL_COMMUNITY)
Admission: EM | Admit: 2011-07-24 | Discharge: 2011-07-24 | Disposition: A | Discharge status: Home / Self Care | Payer: Medicaid Other | Attending: Emergency Medicine | Admitting: Emergency Medicine

## 2011-07-24 ENCOUNTER — Emergency Department (HOSPITAL_COMMUNITY): Payer: Medicaid Other

## 2011-07-24 DIAGNOSIS — I10 Essential (primary) hypertension: Secondary | ICD-10-CM | POA: Insufficient documentation

## 2011-07-24 DIAGNOSIS — K6289 Other specified diseases of anus and rectum: Secondary | ICD-10-CM | POA: Insufficient documentation

## 2011-07-24 DIAGNOSIS — I252 Old myocardial infarction: Secondary | ICD-10-CM | POA: Insufficient documentation

## 2011-07-24 DIAGNOSIS — R319 Hematuria, unspecified: Secondary | ICD-10-CM | POA: Insufficient documentation

## 2011-07-24 DIAGNOSIS — N419 Inflammatory disease of prostate, unspecified: Secondary | ICD-10-CM

## 2011-07-24 DIAGNOSIS — I25810 Atherosclerosis of coronary artery bypass graft(s) without angina pectoris: Secondary | ICD-10-CM | POA: Insufficient documentation

## 2011-07-24 DIAGNOSIS — R109 Unspecified abdominal pain: Secondary | ICD-10-CM | POA: Insufficient documentation

## 2011-07-24 LAB — URINALYSIS, ROUTINE W REFLEX MICROSCOPIC
Bilirubin Urine: NEGATIVE
Glucose, UA: NEGATIVE mg/dL
Ketones, ur: NEGATIVE mg/dL
Nitrite: NEGATIVE
Protein, ur: NEGATIVE mg/dL
Specific Gravity, Urine: 1.032 — ABNORMAL HIGH (ref 1.005–1.030)
Urobilinogen, UA: 0.2 mg/dL (ref 0.0–1.0)
pH: 5.5 (ref 5.0–8.0)

## 2011-07-24 LAB — CBC
HCT: 39.1 % (ref 39.0–52.0)
Hemoglobin: 13.2 g/dL (ref 13.0–17.0)
MCH: 27.8 pg (ref 26.0–34.0)
MCHC: 33.8 g/dL (ref 30.0–36.0)
MCV: 82.5 fL (ref 78.0–100.0)
Platelets: 226 10*3/uL (ref 150–400)
RBC: 4.74 MIL/uL (ref 4.22–5.81)
RDW: 12.6 % (ref 11.5–15.5)
WBC: 7.5 10*3/uL (ref 4.0–10.5)

## 2011-07-24 LAB — COMPREHENSIVE METABOLIC PANEL
ALT: 15 U/L (ref 0–53)
AST: 22 U/L (ref 0–37)
Albumin: 3.6 g/dL (ref 3.5–5.2)
Alkaline Phosphatase: 100 U/L (ref 39–117)
BUN: 14 mg/dL (ref 6–23)
CO2: 22 mEq/L (ref 19–32)
Calcium: 8.9 mg/dL (ref 8.4–10.5)
Chloride: 108 mEq/L (ref 96–112)
Creatinine, Ser: 1.14 mg/dL (ref 0.50–1.35)
GFR calc Af Amer: 86 mL/min — ABNORMAL LOW (ref >90)
GFR calc non Af Amer: 74 mL/min — ABNORMAL LOW (ref >90)
Glucose, Bld: 107 mg/dL — ABNORMAL HIGH (ref 70–99)
Potassium: 3.8 mEq/L (ref 3.5–5.1)
Sodium: 141 mEq/L (ref 135–145)
Total Bilirubin: 0.5 mg/dL (ref 0.3–1.2)
Total Protein: 7.1 g/dL (ref 6.0–8.3)

## 2011-07-24 LAB — DIFFERENTIAL
Basophils Absolute: 0 10*3/uL (ref 0.0–0.1)
Basophils Relative: 1 % (ref 0–1)
Eosinophils Absolute: 0.1 10*3/uL (ref 0.0–0.7)
Eosinophils Relative: 1 % (ref 0–5)
Lymphocytes Relative: 17 % (ref 12–46)
Lymphs Abs: 1.3 10*3/uL (ref 0.7–4.0)
Monocytes Absolute: 0.6 10*3/uL (ref 0.1–1.0)
Monocytes Relative: 8 % (ref 3–12)
Neutro Abs: 5.5 10*3/uL (ref 1.7–7.7)
Neutrophils Relative %: 74 % (ref 43–77)

## 2011-07-24 LAB — URINE MICROSCOPIC-ADD ON

## 2011-07-24 MED ORDER — DEXTROSE 5 % IV SOLN
1.0000 g | Freq: Once | INTRAVENOUS | Status: AC
  Administered 2011-07-24: 1 g via INTRAVENOUS
  Filled 2011-07-24: qty 10

## 2011-07-24 MED ORDER — IOHEXOL 300 MG/ML  SOLN
100.0000 mL | Freq: Once | INTRAMUSCULAR | Status: AC | PRN
  Administered 2011-07-24: 100 mL via INTRAVENOUS

## 2011-07-24 MED ORDER — HYDROMORPHONE HCL PF 1 MG/ML IJ SOLN
1.0000 mg | Freq: Once | INTRAMUSCULAR | Status: AC
  Administered 2011-07-24: 1 mg via INTRAVENOUS
  Filled 2011-07-24: qty 1

## 2011-07-24 MED ORDER — SULFAMETHOXAZOLE-TRIMETHOPRIM 800-160 MG PO TABS: 1.0000 | ORAL_TABLET | Freq: Two times a day (BID) | ORAL | Status: AC

## 2011-07-24 MED ORDER — OXYCODONE-ACETAMINOPHEN 5-325 MG PO TABS: 1.0000 | ORAL_TABLET | Freq: Four times a day (QID) | ORAL | Status: AC | PRN

## 2011-07-24 NOTE — ED Provider Notes (Signed)
History     CSN: [id number]  Arrival date & time 07/24/11  1035   First MD Initiated Contact with Patient 07/24/11 1049      Chief Complaint  Patient presents with  . Abdominal Pain    (Consider location/radiation/quality/duration/timing/severity/associated sxs/prior treatment) HPI Comments: Patient presents with complaint of hematuria, fecal incontinence, rectal pain for the past 2 weeks. Patient had injections of Solesta in rectum on 07/10/2011 as a treatment for fecal incontinence. This procedure was performed by Dr. Elnoria Howard. Immediately after and since that procedure the patient has had gross hematuria and rectal pain. He continues to have fecal incontinence. Patient denies blood in stool. He states that he has rectal pain. Patient denies fever, nausea or vomiting. He does not have dysuria. 2 days prior to this procedure the patient had a sternal wire removed from a previous median sternotomy. Patient takes Plavix 75 mg daily.  Patient is a 51 y.o. male presenting with hematuria. The history is provided by the patient.  Hematuria This is a new problem. The current episode started 1 to 4 weeks ago. The problem is unchanged. He describes the hematuria as gross hematuria. The hematuria occurs throughout his entire urinary stream. He reports no clotting in his urine stream. The pain is mild. He describes his urine color as dark red. Pertinent negatives include no abdominal pain, dysuria, fever, flank pain, nausea or vomiting.    Past Medical History  Diagnosis Date  . Coronary artery disease     STATUS POST CABG 01/2009  . 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 Dyslipidemia   . Hypertension   . Dyspnea   . Orthostatic hypotension   . Syncope and collapse   . Chest pain   . Myocardial infarction     Past Surgical History  Procedure Date  . Cardiac catheterization 10/19/2009  . Ankle surgery     x 2  . Coronary artery bypass graft   . Sternal wires removal 07/08/2011    Procedure:  STERNAL WIRES REMOVAL;  Surgeon: [name], MD;  Location: [facility] OR;  Service: Open Heart Surgery;  Laterality: N/A;  removal of 4th wire    Family History  Problem Relation Age of Onset  . Coronary artery disease Father     History  Substance Use Topics  . Smoking status: Former Smoker -- 3.0 packs/day for 30 years    Types: Cigarettes, Cigars    Quit date: 01/26/2009  . Smokeless tobacco: Never Used  . Alcohol Use: No      Review of Systems  Constitutional: Negative for fever.  HENT: Negative for sore throat and rhinorrhea.   Eyes: Negative for redness.  Respiratory: Negative for cough.   Cardiovascular: Negative for chest pain.  Gastrointestinal: Positive for rectal pain. Negative for nausea, vomiting, abdominal pain, diarrhea and blood in stool.       + fecal incontinence  Genitourinary: Positive for hematuria. Negative for dysuria, flank pain, decreased urine volume, discharge and testicular pain.  Musculoskeletal: Negative for myalgias.  Skin: Negative for rash.  Neurological: Negative for headaches.    Allergies  Review of patient's allergies indicates no known allergies.  Home Medications   Current Outpatient Rx  Name Route Sig Dispense Refill  . CLOPIDOGREL BISULFATE 75 MG PO TABS Oral Take 75 mg by mouth daily.    Marland Kitchen HYDROCHLOROTHIAZIDE 25 MG PO TABS Oral Take 25 mg by mouth daily.     Marland Kitchen LOSARTAN POTASSIUM 100 MG PO TABS Oral Take 100 mg  by mouth daily.    . OXYCODONE-ACETAMINOPHEN 5-325 MG PO TABS Oral Take 1-2 tablets by mouth every 6 (six) hours as needed for pain. 30 tablet 0  . POTASSIUM CHLORIDE ER 10 MEQ PO TBCR Oral Take 10 mEq by mouth daily.    Marland Kitchen ROSUVASTATIN CALCIUM 20 MG PO TABS Oral Take 20 mg by mouth at bedtime.    Marland Kitchen TIOTROPIUM BROMIDE MONOHYDRATE 18 MCG IN CAPS Inhalation Place 18 mcg into inhaler and inhale daily.       BP 123/75  Pulse 91  Temp(Src) 98.3 F (36.8 C) (Oral)  Resp 18  SpO2 98%  Physical Exam  Nursing note and  vitals reviewed. Constitutional: He is oriented to person, place, and time. He appears well-developed and well-nourished.  HENT:  Head: Normocephalic and atraumatic.  Right Ear: External ear normal.  Left Ear: External ear normal.  Nose: Nose normal.  Mouth/Throat: Oropharynx is clear and moist.       Conjunctiva slightly pale.   Eyes: Conjunctivae are normal. Right eye exhibits no discharge. Left eye exhibits no discharge.  Neck: Normal range of motion. Neck supple.  Cardiovascular: Normal rate, regular rhythm and normal heart sounds.   Pulmonary/Chest: Effort normal and breath sounds normal. No respiratory distress. He has no wheezes.  Abdominal: Soft. There is no tenderness. There is no rebound and no guarding.  Genitourinary: Rectal exam shows tenderness. Rectal exam shows no external hemorrhoid.       Mild amount of external stool. No gross blood. Generalized tenderness with palpation of rectum on DRE but tenderness is worse anteriorly.   Musculoskeletal: He exhibits no edema and no tenderness.  Neurological: He is alert and oriented to person, place, and time.  Skin: Skin is warm and dry.  Psychiatric: He has a normal mood and affect.    ED Course  Procedures (including critical care time)  Labs Reviewed  URINALYSIS, ROUTINE W REFLEX MICROSCOPIC - Abnormal; Notable for the following:    Color, Urine AMBER (*) BIOCHEMICALS MAY BE AFFECTED BY COLOR   APPearance CLOUDY (*)    Specific Gravity, Urine 1.032 (*)    Hgb urine dipstick LARGE (*)    Leukocytes, UA MODERATE (*)    All other components within normal limits  COMPREHENSIVE METABOLIC PANEL - Abnormal; Notable for the following:    Glucose, Bld 107 (*)    GFR calc non Af Amer 74 (*)    GFR calc Af Amer 86 (*)    All other components within normal limits  URINE MICROSCOPIC-ADD ON - Abnormal; Notable for the following:    Bacteria, UA MANY (*)    Casts HYALINE CASTS (*)    All other components within normal limits  CBC   DIFFERENTIAL  URINE CULTURE   Ct Abdomen Pelvis W Contrast  07/24/2011  *RADIOLOGY REPORT*  Clinical Data: Lower abdominal and rectal pain.  Hematuria.  CT ABDOMEN AND PELVIS WITH CONTRAST  Technique:  Multidetector CT imaging of the abdomen and pelvis was performed following the standard protocol during bolus administration of intravenous contrast.  Contrast:  100 ml Omnipaque-300.  Comparison: None.  Findings: The lung bases are clear.  No pleural or pericardial effusion.  The patient has a few sigmoid diverticula but there is no evidence of diverticulitis.  The colon otherwise appears normal.  The stomach, small bowel and appendix all appear normal.  The gallbladder, biliary tree, adrenal glands, spleen, pancreas and left kidney appear normal.  The patient has two small renal cysts  on the right measuring 0.7 cm or less.  Atherosclerosis in a nonaneurysmal aorta and iliac system is identified.  There is no focal fluid collection or mass.  Two-to-three low attenuation foci are identified with the prostate gland.  The largest measures 2.4 cm in diameter.  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  No worrisome bony lesion.  IMPRESSION:  1.  Low attenuation foci within the prostate gland are worrisome for abscesses in association with prostatitis.  This could be responsible for the patient's hematuria. 2.  Mild diverticulosis without diverticulitis. 3.  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  Original Report Authenticated By: Bernadene Bell. D'ALESSIO, M.D.   1. Prostatitis     11:19 AM Patient seen and examined. Work-up initiated. Patient refusing pain medication.  Vital signs reviewed and are as follows: Filed Vitals:   07/24/11 1048  BP: 123/75  Pulse: 91  Temp: 98.3 F (36.8 C)  Resp: 18   Patient was discussed with Donnetta Hutching, MD  12:52 PM Patient was informed of results to this point. CT is pending. Patient asking  for pain medicine. Ordered. Exam is unchanged.   2:39 PM Spoke with and updated Dr. Elnoria Howard. Will call urology for reccs.   3:03 PM Spoke with Dr. Brunilda Payor who recomends Septra DS, oral fluids, urine culture, follow-up in office.   Rocephin was given for prostatitis. Patient's pain is under control. Informed of discussion with urologist. Renae Gloss to follow-up.   The patient was urged to return to the Emergency Department immediately with worsening of current symptoms, worsening abdominal pain, persistent vomiting, blood noted in stools, fever, or any other concerns. The patient verbalized understanding.   MDM  Patient with complication due to recent GI procedure. Urology advice and follow-up obtained. No concern for systemic infection at this time, afebrile, blood tests reassuring.         Renne Crigler, [place] 07/24/11 [phone]

## 2011-07-24 NOTE — ED Notes (Signed)
Pt states 1 month ago md hung done and procedure on him of an exprienmental procedure for him having bowel and stool incont. Pt has been having bleeding while voiding straight blood worse over the last week, buringon urination and pain to rectum area. md hung said to call if need to talk with him [phone]

## 2011-07-24 NOTE — ED Notes (Signed)
IV ANTIBIOTIC INFUSED.

## 2011-07-24 NOTE — Discharge Instructions (Signed)
Please read and follow all provided instructions.  Your diagnoses today include:  1. Prostatitis    Tests performed today include:  CT showing prostatitis, no abscess  Blood counts and electrolytes  Vital signs. See below for your results today.   Medications prescribed:   Percocet (oxycodone/acetaminophen) - narcotic pain medication  You have been prescribed narcotic pain medication such as Vicodin or Percocet: DO NOT drive or perform any activities that require you to be awake and alert because this medicine can make you drowsy. BE VERY CAREFUL not to take multiple medicines containing Tylenol (also called acetaminophen). Doing so can lead to an overdose which can damage your liver and cause liver failure and possibly death.    Septra DS - antibiotic  You have been prescribed an antibiotic medicine: take the entire course of medicine even if you are feeling better. Stopping early can cause the antibiotic not to work.  Take any prescribed medications only as directed.  Home care instructions:  Follow any educational materials contained in this packet.  BE VERY CAREFUL not to take multiple medicines containing Tylenol (also called acetaminophen). Doing so can lead to an overdose which can damage your liver and cause liver failure and possibly death.   Follow-up instructions: Call the urologist listed for a follow-up appointment.   Please follow-up with your primary care provider in the next 3 days for further evaluation of your symptoms. If you do not have a primary care doctor -- see below for referral information.   Return instructions:   Please return to the Emergency Department if you experience worsening symptoms.   Return with worsening pain, fever, persistent vomiting, blood in your stool.  Please return if you have any other emergent concerns.  Additional Information:  Your vital signs today were: BP 133/80  Pulse 49  Temp(Src) 98 F (36.7 C) (Oral)  Resp 18   SpO2 100% If your blood pressure (BP) was elevated above 135/85 this visit, please have this repeated by your doctor within one month. -------------- No Primary Care Doctor Call [facility]  [phone] Other agencies that provide inexpensive medical care    [facility]  [phone]    [facility]  [phone]    [facility]  [phone]    [facility]  [phone]    [facility]  [phone]    [facility]  [phone] -------------- RESOURCE GUIDE:  Dental Problems  Patients with Medicaid: [facility] Dental [phone]                                            [phone] [address] [employer] Phone:  [phone]                                                   Phone:  [phone]  If unable to pay or uninsured, contact:  [facility] or [facility] to become qualified for the adult dental clinic.  Chronic Pain Problems Contact [facility]  [phone] Patients need to be referred by their primary care doctor.  Insufficient Money for Medicine Contact [employer]:  call "211" or [facility] [phone].  Psychological Services [facility] Behavioral Health  [phone] [facility]  [phone] [facility] Mental Health   [phone] (emergency services [phone])  Substance Abuse Resources [facility]  [phone] [facility] [phone] [facility] [place] [phone] Floydene Flock [phone] Residential & Outpatient Substance Abuse Program  [phone]  Abuse/Neglect [facility] Child Abuse Hotline 779-222-3985 [facility] Child Abuse Hotline [phone] (After Hours)  Emergency Shelter [facility] [phone]  Maternity Homes Room at the [place] [facility] [phone] [place] [facility] [phone]  [facility] Resources  [facility]      [employer]                          [facility] Dept. [address] [place]                       [address]      [address]  Blondell Reveal Phone:  [phone]                                   Phone:  [phone]                 Phone:  [phone]  [facility] Mental Health Phone:  [phone]  [facility] Child Abuse Hotline 6298726279 [phone] (After Hours)

## 2011-07-25 NOTE — ED Provider Notes (Signed)
Medical screening examination/treatment/procedure(s) were conducted as a shared visit with non-physician practitioner(s) and myself.  I personally evaluated the patient during the encounter.  PA discussed case with gastroenterologist and urologist. Patient is nontoxic. No acute abdomen. Will Rx appropriate antibiotic for prostatitis. Close follow up with urology  Donnetta Hutching, MD 07/25/11 1049

## 2011-07-26 LAB — URINE CULTURE
Colony Count: NO GROWTH
Culture  Setup Time: 201303290849
Culture: NO GROWTH
Special Requests: NORMAL

## 2011-07-28 HISTORY — PX: COLONOSCOPY: SHX174

## 2011-07-28 LAB — OCCULT BLOOD, POC DEVICE: Fecal Occult Bld: NEGATIVE

## 2011-08-12 DIAGNOSIS — M25373 Other instability, unspecified ankle: Secondary | ICD-10-CM | POA: Insufficient documentation

## 2011-08-12 DIAGNOSIS — M216X9 Other acquired deformities of unspecified foot: Secondary | ICD-10-CM | POA: Insufficient documentation

## 2011-08-15 ENCOUNTER — Other Ambulatory Visit: Payer: Self-pay

## 2011-08-15 ENCOUNTER — Other Ambulatory Visit: Payer: Self-pay | Admitting: Cardiovascular Disease

## 2011-08-17 ENCOUNTER — Encounter (HOSPITAL_COMMUNITY): Payer: Self-pay | Admitting: Emergency Medicine

## 2011-08-17 ENCOUNTER — Emergency Department (HOSPITAL_COMMUNITY)
Admission: EM | Admit: 2011-08-17 | Discharge: 2011-08-18 | Disposition: A | Discharge status: Home / Self Care | Payer: Medicaid Other | Attending: Emergency Medicine | Admitting: Emergency Medicine

## 2011-08-17 ENCOUNTER — Emergency Department (HOSPITAL_COMMUNITY): Payer: Medicaid Other

## 2011-08-17 DIAGNOSIS — M545 Low back pain, unspecified: Secondary | ICD-10-CM | POA: Insufficient documentation

## 2011-08-17 DIAGNOSIS — I252 Old myocardial infarction: Secondary | ICD-10-CM | POA: Insufficient documentation

## 2011-08-17 DIAGNOSIS — R109 Unspecified abdominal pain: Secondary | ICD-10-CM | POA: Insufficient documentation

## 2011-08-17 DIAGNOSIS — N509 Disorder of male genital organs, unspecified: Secondary | ICD-10-CM | POA: Insufficient documentation

## 2011-08-17 DIAGNOSIS — K6289 Other specified diseases of anus and rectum: Secondary | ICD-10-CM | POA: Insufficient documentation

## 2011-08-17 DIAGNOSIS — I2510 Atherosclerotic heart disease of native coronary artery without angina pectoris: Secondary | ICD-10-CM | POA: Insufficient documentation

## 2011-08-17 DIAGNOSIS — I10 Essential (primary) hypertension: Secondary | ICD-10-CM | POA: Insufficient documentation

## 2011-08-17 DIAGNOSIS — R110 Nausea: Secondary | ICD-10-CM | POA: Insufficient documentation

## 2011-08-17 DIAGNOSIS — E785 Hyperlipidemia, unspecified: Secondary | ICD-10-CM | POA: Insufficient documentation

## 2011-08-17 DIAGNOSIS — N419 Inflammatory disease of prostate, unspecified: Secondary | ICD-10-CM | POA: Insufficient documentation

## 2011-08-17 DIAGNOSIS — Z79899 Other long term (current) drug therapy: Secondary | ICD-10-CM | POA: Insufficient documentation

## 2011-08-17 DIAGNOSIS — R300 Dysuria: Secondary | ICD-10-CM | POA: Insufficient documentation

## 2011-08-17 LAB — DIFFERENTIAL
Basophils Absolute: 0 10*3/uL (ref 0.0–0.1)
Basophils Relative: 1 % (ref 0–1)
Eosinophils Absolute: 0.1 10*3/uL (ref 0.0–0.7)
Eosinophils Relative: 2 % (ref 0–5)
Lymphocytes Relative: 24 % (ref 12–46)
Lymphs Abs: 1.2 10*3/uL (ref 0.7–4.0)
Monocytes Absolute: 0.5 10*3/uL (ref 0.1–1.0)
Monocytes Relative: 9 % (ref 3–12)
Neutro Abs: 3.3 10*3/uL (ref 1.7–7.7)
Neutrophils Relative %: 65 % (ref 43–77)

## 2011-08-17 LAB — CBC
HCT: 37.3 % — ABNORMAL LOW (ref 39.0–52.0)
Hemoglobin: 12 g/dL — ABNORMAL LOW (ref 13.0–17.0)
MCH: 26.9 pg (ref 26.0–34.0)
MCHC: 32.2 g/dL (ref 30.0–36.0)
MCV: 83.6 fL (ref 78.0–100.0)
Platelets: 168 10*3/uL (ref 150–400)
RBC: 4.46 MIL/uL (ref 4.22–5.81)
RDW: 12.9 % (ref 11.5–15.5)
WBC: 5.2 10*3/uL (ref 4.0–10.5)

## 2011-08-17 LAB — URINALYSIS, ROUTINE W REFLEX MICROSCOPIC
Bilirubin Urine: NEGATIVE
Glucose, UA: NEGATIVE mg/dL
Hgb urine dipstick: NEGATIVE
Ketones, ur: NEGATIVE mg/dL
Leukocytes, UA: NEGATIVE
Nitrite: NEGATIVE
Protein, ur: NEGATIVE mg/dL
Specific Gravity, Urine: >1.03 — ABNORMAL HIGH (ref 1.005–1.030)
Urobilinogen, UA: 0.2 mg/dL (ref 0.0–1.0)
pH: 6 (ref 5.0–8.0)

## 2011-08-17 LAB — BASIC METABOLIC PANEL
BUN: 11 mg/dL (ref 6–23)
CO2: 25 mEq/L (ref 19–32)
Calcium: 8.8 mg/dL (ref 8.4–10.5)
Chloride: 106 mEq/L (ref 96–112)
Creatinine, Ser: 1.1 mg/dL (ref 0.50–1.35)
GFR calc Af Amer: 89 mL/min — ABNORMAL LOW (ref >90)
GFR calc non Af Amer: 77 mL/min — ABNORMAL LOW (ref >90)
Glucose, Bld: 131 mg/dL — ABNORMAL HIGH (ref 70–99)
Potassium: 3.4 mEq/L — ABNORMAL LOW (ref 3.5–5.1)
Sodium: 140 mEq/L (ref 135–145)

## 2011-08-17 MED ORDER — CIPROFLOXACIN HCL 250 MG PO TABS
500.0000 mg | ORAL_TABLET | Freq: Once | ORAL | Status: AC
  Administered 2011-08-17: 500 mg via ORAL
  Filled 2011-08-17: qty 2

## 2011-08-17 MED ORDER — ONDANSETRON HCL 4 MG/2ML IJ SOLN
4.0000 mg | Freq: Once | INTRAMUSCULAR | Status: AC
  Administered 2011-08-17: 4 mg via INTRAVENOUS
  Filled 2011-08-17: qty 2

## 2011-08-17 MED ORDER — SODIUM CHLORIDE 0.9 % IV SOLN
Freq: Once | INTRAVENOUS | Status: AC
  Administered 2011-08-17: 21:00:00 via INTRAVENOUS

## 2011-08-17 MED ORDER — KETOROLAC TROMETHAMINE 30 MG/ML IJ SOLN
30.0000 mg | Freq: Once | INTRAMUSCULAR | Status: AC
  Administered 2011-08-17: 30 mg via INTRAVENOUS
  Filled 2011-08-17: qty 1

## 2011-08-17 MED ORDER — CIPROFLOXACIN HCL 500 MG PO TABS: 500.0000 mg | ORAL_TABLET | Freq: Two times a day (BID) | ORAL | Status: AC

## 2011-08-17 MED ORDER — OXYCODONE-ACETAMINOPHEN 5-325 MG PO TABS: 1.0000 | ORAL_TABLET | Freq: Four times a day (QID) | ORAL | Status: AC | PRN

## 2011-08-17 MED ORDER — HYDROMORPHONE HCL PF 1 MG/ML IJ SOLN
1.0000 mg | Freq: Once | INTRAMUSCULAR | Status: AC
  Administered 2011-08-17: 1 mg via INTRAVENOUS
  Filled 2011-08-17: qty 1

## 2011-08-17 NOTE — ED Notes (Signed)
Pt had a procedure done last week at [place].  Doesn't know what it was but that it was a experimental procedure and states that now he has abdominal pain that radiates into his testicles.  Denies N/V/D

## 2011-08-17 NOTE — ED Provider Notes (Signed)
History  This chart was scribed for Allen Lennert, MD by Bennett Scrape and Toya Smothers. This patient was seen in room APA10/APA10 and the patient's care was started at 8:30PM  CSN: [id number]  Arrival date & time 08/17/11  7846   First MD Initiated Contact with Patient 08/17/11 2017      Chief Complaint  Patient presents with  . Abdominal Pain  . Testicle Pain    Patient is a 51 y.o. male presenting with testicular pain. The history is provided by the patient. No language interpreter was used.  Testicle Pain This is a new problem. The current episode started 3 to 5 hours ago. The problem occurs constantly. The problem has been gradually worsening. Associated symptoms include abdominal pain (radiation from testicle). Pertinent negatives include no chest pain, no headaches and no shortness of breath. The symptoms are aggravated by walking. The symptoms are relieved by nothing. He has tried nothing for the symptoms.     [name] is a 51 y.o. male who presents to the Emergency Department complaining of gradual onset, gradually worsening, waxing and waning testicular pain that started today. Patient states that the pain originates in left testicle and radiates across the lower back and lower abdomen. Pt lists dysuria and nausea as associated symptoms. He states that the pain is worses with walking, coughing, or touching the area. Pt describes the pain as being equivalent to "someone hitting me with a baseball bat."  He denies injury as the cause. He denies having any prior episodes of similar pain. Patient also complains of rectal pain as a result of an experimental procedure performed at [facility] by Dr. Elnoria Howard about one month ago, stating that pain eminates from rectum to testicles. Pt denies fever, HA, diarrhea and constipation as associated sympotms. He has a h/o CAD, HTN and acute MI. He is a former smoker and denies alcohol use.    Pt's PCP is Dr. Modesto Charon.  Past Medical History    Diagnosis Date  . Coronary artery disease     STATUS POST CABG 01/2009  . 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 Dyslipidemia   . Hypertension   . Dyspnea   . Orthostatic hypotension   . Syncope and collapse   . Chest pain   . Myocardial infarction     Past Surgical History  Procedure Date  . Cardiac catheterization 10/19/2009  . Ankle surgery     x 2  . Coronary artery bypass graft   . Sternal wires removal 07/08/2011    Procedure: STERNAL WIRES REMOVAL;  Surgeon: [name], MD;  Location: [facility] OR;  Service: Open Heart Surgery;  Laterality: N/A;  removal of 4th wire    Family History  Problem Relation Age of Onset  . Coronary artery disease Father     History  Substance Use Topics  . Smoking status: Former Smoker -- 3.0 packs/day for 30 years    Types: Cigarettes, Cigars    Quit date: 01/26/2009  . Smokeless tobacco: Never Used  . Alcohol Use: No      Review of Systems  Constitutional: Negative for fatigue.  HENT: Negative for congestion, sinus pressure and ear discharge.   Eyes: Negative for discharge.  Respiratory: Negative for cough and shortness of breath.   Cardiovascular: Negative for chest pain.  Gastrointestinal: Positive for nausea and abdominal pain (radiation from testicle). Negative for diarrhea.  Genitourinary: Positive for dysuria and testicular pain. Negative for frequency and hematuria.  Musculoskeletal: Positive  for back pain (radiation from testicle).  Skin: Negative for rash.  Neurological: Negative for seizures and headaches.  Hematological: Negative.   Psychiatric/Behavioral: Negative for hallucinations.    Allergies  Review of patient's allergies indicates no known allergies.  Home Medications   Current Outpatient Rx  Name Route Sig Dispense Refill  . HYDROCHLOROTHIAZIDE 25 MG PO TABS Oral Take 25 mg by mouth daily.     Marland Kitchen LOSARTAN POTASSIUM 100 MG PO TABS Oral Take 100 mg by mouth daily.    Marland Kitchen POTASSIUM CHLORIDE ER 10 MEQ PO TBCR  Oral Take 10 mEq by mouth daily.    Marland Kitchen POTASSIUM CHLORIDE CRYS ER 10 MEQ PO TBCR  TAKE 1 TABLET BY MOUTH EVERY DAY 30 tablet 10  . ROSUVASTATIN CALCIUM 20 MG PO TABS Oral Take 20 mg by mouth at bedtime.    Marland Kitchen TIOTROPIUM BROMIDE MONOHYDRATE 18 MCG IN CAPS Inhalation Place 18 mcg into inhaler and inhale daily.       Triage Vitals: BP 126/53  Pulse 75  Temp(Src) 98.9 F (37.2 C) (Oral)  Resp 20  Ht 5\' 9"  (1.753 m)  Wt 240 lb (108.863 kg)  BMI 35.44 kg/m2  SpO2 99%  Physical Exam  Nursing note and vitals reviewed. Constitutional: He is oriented to person, place, and time. He appears well-developed and well-nourished.  HENT:  Head: Normocephalic and atraumatic.  Eyes: Conjunctivae and EOM are normal. No scleral icterus.  Neck: Normal range of motion. Neck supple. No thyromegaly present.  Cardiovascular: Normal rate and regular rhythm.  Exam reveals no gallop and no friction rub.   No murmur heard. Pulmonary/Chest: Effort normal and breath sounds normal. No stridor. He has no wheezes. He has no rales. He exhibits no tenderness.  Abdominal: Soft. Bowel sounds are normal. He exhibits no distension. There is no tenderness. There is no rebound.  Genitourinary:       Tender in the left testicle, no swelling noted, normal rectal exam, rectal tone is normal, mild rectal tenderness  Musculoskeletal: Normal range of motion. He exhibits tenderness (mild left flank tenderness). He exhibits no edema.  Lymphadenopathy:    He has no cervical adenopathy.  Neurological: He is alert and oriented to person, place, and time. Coordination normal.  Skin: Skin is warm and dry. No rash noted. No erythema.  Psychiatric: He has a normal mood and affect. His behavior is normal.    ED Course  Procedures (including critical care time)  DIAGNOSTIC STUDIES: Oxygen Saturation is 99% on room air, normal by my interpretation.    COORDINATION OF CARE: 8:40PM-Discussed pain medications, urinalysis and x-ray with pt  and pt agreed to plan.   Labs Reviewed  CBC - Abnormal; Notable for the following:    Hemoglobin 12.0 (*)    HCT 37.3 (*)    All other components within normal limits  BASIC METABOLIC PANEL - Abnormal; Notable for the following:    Potassium 3.4 (*)    Glucose, Bld 131 (*)    GFR calc non Af Amer 77 (*)    GFR calc Af Amer 89 (*)    All other components within normal limits  URINALYSIS, ROUTINE W REFLEX MICROSCOPIC - Abnormal; Notable for the following:    Specific Gravity, Urine >1.030 (*)    All other components within normal limits  DIFFERENTIAL    Ct Abdomen Pelvis Wo Contrast  08/17/2011  *RADIOLOGY REPORT*  Clinical Data: Left lower groin and testicular pain.  Difficulty urinating.  Hematuria.  CT ABDOMEN  AND PELVIS WITHOUT CONTRAST  Technique:  Multidetector CT imaging of the abdomen and pelvis was performed following the standard protocol without intravenous contrast.  Comparison: 07/24/2011  Findings: The lung bases are clear.  Postoperative changes in the mediastinum.  Coronary artery stents.  Mild calcifications of the renal hila consistent with vascular calcifications.  No renal, ureteral, or bladder stones are visualized.  The bladder is decompressed and cannot be evaluated for wall thickening.  No pyelocaliectasis or ureterectasis.  Small accessory spleen.  Contracted gallbladder.  The liver, spleen, pancreas, adrenal glands, and retroperitoneal lymph nodes are otherwise unremarkable.  Calcification throughout the abdominal aorta and iliac arteries without aneurysm.  No free air or free fluid in the abdomen.  The stomach and small bowel are decompressed.  Stool filled colon without distension or wall thickening.  Pelvis:  The prostate gland is not significantly enlarged.  Fluid collections seen previously in the prostate appear to have resolved. However, these may have been more apparent on the prior study which had contrast administered intravenously.  The bladder is decompressed.   No free or loculated pelvic fluid collections. No significant pelvic lymphadenopathy.  The appendix is normal. Degenerative changes in the lumbar spine.  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  IMPRESSION: No renal or ureteral stone or obstruction.  Prostatic fluid collections appear to have resolved since the previous study.  Original Report Authenticated By: Marlon Pel, M.D.     No diagnosis found.    MDM  prostatitis      The chart was scribed for me under my direct supervision.  I personally performed the history, physical, and medical decision making and all procedures in the evaluation of this patient.Allen Lennert, MD 08/17/11 [phone]

## 2011-08-17 NOTE — ED Notes (Signed)
Dr Zammit at bedside. 

## 2011-08-17 NOTE — ED Notes (Signed)
Patient c/o continued abdominal pain.  States medication did not help.

## 2011-08-17 NOTE — Discharge Instructions (Signed)
Follow up this week with your md for recheck °

## 2011-08-20 ENCOUNTER — Other Ambulatory Visit: Payer: Self-pay | Admitting: *Deleted

## 2011-08-20 MED ORDER — CLOPIDOGREL BISULFATE 75 MG PO TABS: 75.0000 mg | ORAL_TABLET | Freq: Every day | ORAL | Status: DC

## 2011-08-20 NOTE — Telephone Encounter (Signed)
plavix has expired for the year and was off med list, called pt to verify that no other doctor had discontinued, i reviewed chart and didn't see mention of DC plavix, pt verified he is still taking. Med reordered.

## 2011-08-26 ENCOUNTER — Other Ambulatory Visit: Payer: Self-pay | Admitting: Cardiothoracic Surgery

## 2011-08-26 DIAGNOSIS — T82218A Other mechanical complication of coronary artery bypass graft, initial encounter: Secondary | ICD-10-CM

## 2011-08-28 ENCOUNTER — Encounter: Payer: Self-pay | Admitting: Cardiothoracic Surgery

## 2011-09-05 ENCOUNTER — Encounter: Payer: Self-pay | Admitting: Cardiothoracic Surgery

## 2011-09-12 ENCOUNTER — Ambulatory Visit (INDEPENDENT_AMBULATORY_CARE_PROVIDER_SITE_OTHER): Payer: Medicaid Other | Admitting: General Surgery

## 2011-09-17 ENCOUNTER — Other Ambulatory Visit: Payer: Self-pay | Admitting: Cardiovascular Disease

## 2011-09-17 MED ORDER — HYDROCHLOROTHIAZIDE 25 MG PO TABS: 25.0000 mg | ORAL_TABLET | Freq: Every day | ORAL | Status: DC

## 2011-09-26 DIAGNOSIS — R151 Fecal smearing: Secondary | ICD-10-CM | POA: Insufficient documentation

## 2011-10-02 ENCOUNTER — Telehealth: Payer: Self-pay | Admitting: Vascular Surgery

## 2011-10-02 ENCOUNTER — Encounter: Payer: Self-pay | Admitting: Neurosurgery

## 2011-10-02 NOTE — Telephone Encounter (Signed)
I called patient regarding his 1 yr fu appt on Monday with abi's and aorta scan prior to seeing NP. He has Medicaid insurance and the pre authorization was denied for his vascular lab studies. I asked Eber Jones to call [employer] for clinical review and she did but the lab studies were both denied. Pt is aware of this and that I also cancelled the appointments for 10/06/11. I did not the patient to be billed for this if insurance denied the studies. He verbalized understanding and I told him I would be in contact as soon as my manager informed me what to reschedule. Jacklyn Shell

## 2011-10-02 NOTE — Telephone Encounter (Signed)
Pt called me back at 3:53pm after I left message for him regarding appts. Per Carolyn/Jen I was advised to schedule pt to come in and see NP for physical exam due to medicaid denial of vascular lab studies. He agreed to come for an appt on 10/09/11 at 9am w/ NP. He was very upset and used profanity during the phone conversation. He mentioned several times that "it was a waste of his time to just see the NP and not have any lab studies" and that we had called him 3 times over the past two days regarding his appt. I stated to the pt that we did call yesterday to remind him of his appt and today I called him as a courtesy to cancel the lab studies because of his insurance denying the pre auth because I did not want him to be responsible for the bill if his insurance did not pay. He also stated that the doctor told him he didn't have to come back at all if he didn't want to. He wasn't happy but did agreed to come in next week. Pt called me back at 4:02pm to ask if he could come tomorrow Friday 10/03/11 and I scheduled an appt for 3:40pm. He apologized for being rude during our earlier conversation. Jacklyn Shell

## 2011-10-03 ENCOUNTER — Ambulatory Visit (INDEPENDENT_AMBULATORY_CARE_PROVIDER_SITE_OTHER): Payer: Medicaid Other | Admitting: Neurosurgery

## 2011-10-03 ENCOUNTER — Encounter: Payer: Self-pay | Admitting: Neurosurgery

## 2011-10-03 ENCOUNTER — Other Ambulatory Visit: Payer: Self-pay

## 2011-10-03 ENCOUNTER — Ambulatory Visit: Payer: Medicaid Other | Admitting: Neurosurgery

## 2011-10-03 VITALS — BP 101/73 | HR 62 | Resp 16 | Ht 72.0 in | Wt 246.7 lb

## 2011-10-03 DIAGNOSIS — M79609 Pain in unspecified limb: Secondary | ICD-10-CM | POA: Insufficient documentation

## 2011-10-03 DIAGNOSIS — I70219 Atherosclerosis of native arteries of extremities with intermittent claudication, unspecified extremity: Secondary | ICD-10-CM | POA: Insufficient documentation

## 2011-10-03 NOTE — Progress Notes (Signed)
[facility] HISTORY AND PHYSICAL   CC: Lower extremity claudication a history of iliac stents Referring Physician: Fields  History of Present Illness: 51 year old male patient of Dr. Darrick Penna that underwent bilateral common iliac stenting in 2011. The patient has been on surveillance with stent duplex and ABIs however for some reason [employer] will not approve his ABIs and stent duplex this year. Patient was brought in for evaluation prior to a second request for these studies to be done. The patient does report some claudication symptoms when he first arises to walk for about the first 30 steps. After that the patient states he can walk quite a long distance without difficulty.  Past Medical History  Diagnosis Date  . Coronary artery disease     STATUS POST CABG 01/2009  . 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 Dyslipidemia   . Hypertension   . Dyspnea   . Orthostatic hypotension   . Syncope and collapse   . Chest pain   . Myocardial infarction   . COPD (chronic obstructive pulmonary disease)     ROS: [x]  Positive   [ ]  Denies    General: [ ]  Weight loss, [ ]  Fever, [ ]  chills Neurologic: [ ]  Dizziness, [ ]  Blackouts, [ ]  Seizure [ ]  Stroke, [ ]  "Mini stroke", [ ]  Slurred speech, [ ]  Temporary blindness; [ ]  weakness in arms or legs, [ ]  Hoarseness Cardiac: [ ]  Chest pain/pressure, [ ]  Shortness of breath at rest [x ] Shortness of breath with exertion, [ ]  Atrial fibrillation or irregular heartbeat Vascular: [x ] Pain in legs with walking, [x ] Pain in legs at rest, [ ]  Pain in legs at night,  [ ]  Non-healing ulcer, [ ]  Blood clot in vein/DVT,   Pulmonary: [ ]  Home oxygen, [ ]  Productive cough, [ ]  Coughing up blood, [x ] Asthma,  [x ] Wheezing Musculoskeletal:  [ ]  Arthritis, [ ]  Low back pain, [ ]  Joint pain Hematologic: [ ]  Easy Bruising, [ ]  Anemia; [ ]  Hepatitis Gastrointestinal: [ ]  Blood in stool, [ ]  Gastroesophageal Reflux/heartburn, [ ]  Trouble  swallowing Urinary: [ ]  chronic Kidney disease, [ ]  on HD - [ ]  MWF or [ ]  TTHS, [x ] Burning with urination, [ ]  Difficulty urinating Skin: [ ]  Rashes, [ ]  Wounds Psychological: [ ]  Anxiety, [ ]  Depression   Social History History  Substance Use Topics  . Smoking status: Former Smoker -- 3.0 packs/day for 30 years    Types: Cigarettes, Cigars    Quit date: 01/26/2009  . Smokeless tobacco: Never Used  . Alcohol Use: No    Family History Family History  Problem Relation Age of Onset  . Coronary artery disease Father   . Diabetes Father   . Hyperlipidemia Father   . Heart attack Father   . Cancer Mother   . Hyperlipidemia Mother   . Hypertension Brother     No Known Allergies  Current Outpatient Prescriptions  Medication Sig Dispense Refill  . clopidogrel (PLAVIX) 75 MG tablet Take 1 tablet (75 mg total) by mouth daily.  30 tablet  5  . hydrochlorothiazide (HYDRODIURIL) 25 MG tablet Take 1 tablet (25 mg total) by mouth daily.  30 tablet  5  . losartan (COZAAR) 100 MG tablet Take 100 mg by mouth daily.      . potassium chloride (K-DUR) 10 MEQ tablet Take 10 mEq by mouth daily.      . potassium  chloride (K-DUR,KLOR-CON) 10 MEQ tablet TAKE 1 TABLET BY MOUTH EVERY DAY  30 tablet  10  . rosuvastatin (CRESTOR) 20 MG tablet Take 20 mg by mouth at bedtime.      Marland Kitchen tiotropium (SPIRIVA) 18 MCG inhalation capsule Place 18 mcg into inhaler and inhale daily.         Physical Examination  Filed Vitals:   10/03/11 1518  BP: 101/73  Pulse: 62  Resp:     Body mass index is 33.46 kg/(m^2).  General:  WDWN in NAD Gait: Normal HEENT: WNL Eyes: Pupils equal Pulmonary: normal non-labored breathing , without Rales, rhonchi,  wheezing Cardiac: RRR, without  Murmurs, rubs or gallops; No carotid bruits Abdomen: soft, NT, no masses Skin: no rashes, ulcers noted Vascular Exam/Pulses: Lower extremity pulses are not palpable, PT and DP pulses are heard with Doppler bilaterally, femoral  pulses are 1-2+ bilaterally  Extremities without ischemic changes, no Gangrene , no cellulitis; no open wounds;  Musculoskeletal: no muscle wasting or atrophy  Neurologic: A&O X 3; Appropriate Affect ; SENSATION: normal; MOTOR FUNCTION:  moving all extremities equally. Speech is fluent/normal  Non-Invasive Vascular Imaging:N/A  ASSESSMENT/PLAN: This is asymptomatic patient with worsening claudication symptoms when he first started to ambulate. The patient needs to have his stent graft looked at with duplex as well as lower extremity ABIs. We will wait to see if [employer] approves this, if so the patient return for the studies and see Dr. fields for evaluation. The patient's in agreement with this, his questions were encouraged and answered.  Lauree Chandler ANP  Clinic M.D.: Imogene Burn

## 2011-10-03 NOTE — Progress Notes (Signed)
Addended by: Sharee Pimple on: 10/03/2011 03:49 PM   Modules accepted: Orders

## 2011-10-09 ENCOUNTER — Ambulatory Visit: Payer: Medicaid Other | Admitting: Neurosurgery

## 2011-10-22 ENCOUNTER — Encounter: Payer: Self-pay | Admitting: Vascular Surgery

## 2011-10-23 ENCOUNTER — Other Ambulatory Visit (INDEPENDENT_AMBULATORY_CARE_PROVIDER_SITE_OTHER): Payer: Medicaid Other | Admitting: *Deleted

## 2011-10-23 ENCOUNTER — Encounter: Payer: Self-pay | Admitting: Vascular Surgery

## 2011-10-23 ENCOUNTER — Ambulatory Visit (INDEPENDENT_AMBULATORY_CARE_PROVIDER_SITE_OTHER): Payer: Medicaid Other | Admitting: Vascular Surgery

## 2011-10-23 ENCOUNTER — Encounter (INDEPENDENT_AMBULATORY_CARE_PROVIDER_SITE_OTHER): Payer: Medicaid Other | Admitting: *Deleted

## 2011-10-23 VITALS — BP 112/79 | HR 54 | Resp 16 | Ht 72.0 in | Wt 246.5 lb

## 2011-10-23 DIAGNOSIS — M545 Low back pain, unspecified: Secondary | ICD-10-CM

## 2011-10-23 DIAGNOSIS — I70219 Atherosclerosis of native arteries of extremities with intermittent claudication, unspecified extremity: Secondary | ICD-10-CM

## 2011-10-23 DIAGNOSIS — M79609 Pain in unspecified limb: Secondary | ICD-10-CM

## 2011-10-23 NOTE — Addendum Note (Signed)
Addended by: Sharee Pimple on: 10/23/2011 11:42 AM   Modules accepted: Orders

## 2011-10-23 NOTE — Progress Notes (Signed)
Patient is a 51 year old male who has previously undergone bilateral common iliac stenting in July of 2011. Over the past year he has noticed increasing pain in the anterior and posterior portion of each calf when walking. This occurs now about a half a block. It is intermittent however, sometimes longer distances sometimes short distances. He does have occasional numbness and tingling in the right calf. He denies any chronic back pain. However he states that was certain motions he does get some pain in his back. Other chronic medical problems include coronary artery disease, hyperlipidemia, hypertension. These are all currently stable and followed by Dr. Modesto Charon.  He is on Crestor. However he has been on this for quite some time without problems.  Review of systems: He denies any bowel or bladder dysfunction. He denies any chest pain. He denies any shortness of breath.  Physical exam: Filed Vitals:   10/23/11 1013  BP: 112/79  Pulse: 54  Resp: 16  Height: 6' (1.829 m)  Weight: 246 lb 8 oz (111.812 kg)  SpO2: 99%   Chest clear to auscultation bilaterally  Cardiac: Regular rate and rhythm without murmur  Neuro: Upper extremity and lower extremity motor strength are 5/5  Extremities: 2+ brachial radial femoral dorsalis pedis and posterior tibial pulses bilaterally  Skin: No ulcerations  Data: The patient had bilateral ABIs performed today which were greater than 1 bilaterally and triphasic duplex ultrasound of his iliac stents were unchanged from June of 2012.  Assessment: Bilateral lower extremity pain not of arterial occlusive etiology possibly related to pseudo-claudication from spinal stenosis  Plan: MRI lumbar spine followup after his MRI exam  Fabienne Bruns, MD [facility] Office: [phone] Pager: [phone]

## 2011-11-06 ENCOUNTER — Other Ambulatory Visit: Payer: Medicaid Other

## 2011-11-06 ENCOUNTER — Ambulatory Visit: Payer: Medicaid Other | Admitting: Vascular Surgery

## 2011-11-06 NOTE — Procedures (Unsigned)
AORTA-ILIAC DUPLEX EVALUATION  INDICATION:  Bilateral common iliac artery stents.  HISTORY: Diabetes:  No Cardiac:  No Hypertension:  Yes Smoking:  Previous Previous Surgery:  Bilateral common iliac artery stents on 10/26/2009              SINGLE LEVEL ARTERIAL EXAM                             RIGHT                  LEFT Brachial: Anterior tibial: Posterior tibial: Peroneal: Ankle/brachial index: Previous ABI/date:  AORTA-ILIAC DUPLEX EXAM Aorta - Proximal     Not visualized Aorta - Mid          63 cm/s Aorta - Distal       81 cm/s  RIGHT                                   LEFT 166 cm/s          CIA-PROXIMAL          136 cm/s 221 cm/s          CIA-DISTAL            152 cm/s Not visualized    HYPOGASTRIC           Not visualized 164 cm/s          EIA-PROXIMAL          148 cm/s 153 cm/s          EIA-MID               197 cm/s 138 cm/s          EIA-DISTAL            158 cm/s  IMPRESSION: 1. Patent bilateral common iliac artery stents with peak velocities of     221 cm/s and 197 cm/s noted in the right distal common iliac artery     and left mid external iliac arteries, respectively. 2. No significant change in maximum velocities of the bilateral iliac     arteries when compared to the previous exam on 10/03/2010. 3. Bilateral ankle brachial indices are noted on the separate report.  ___________________________________________ Janetta Hora. Fields, MD  CH/MEDQ  D:  10/27/2011  T:  10/27/2011  Job:  [id number]

## 2011-11-13 DIAGNOSIS — N419 Inflammatory disease of prostate, unspecified: Secondary | ICD-10-CM | POA: Insufficient documentation

## 2011-11-25 ENCOUNTER — Telehealth: Payer: Self-pay | Admitting: Vascular Surgery

## 2011-11-26 ENCOUNTER — Encounter (HOSPITAL_COMMUNITY): Payer: Self-pay | Admitting: *Deleted

## 2011-11-26 ENCOUNTER — Emergency Department (HOSPITAL_COMMUNITY): Payer: Medicaid Other

## 2011-11-26 ENCOUNTER — Emergency Department (HOSPITAL_COMMUNITY)
Admission: EM | Admit: 2011-11-26 | Discharge: 2011-11-27 | Disposition: A | Discharge status: Home / Self Care | Payer: Medicaid Other | Attending: Emergency Medicine | Admitting: Emergency Medicine

## 2011-11-26 DIAGNOSIS — E785 Hyperlipidemia, unspecified: Secondary | ICD-10-CM | POA: Insufficient documentation

## 2011-11-26 DIAGNOSIS — J449 Chronic obstructive pulmonary disease, unspecified: Secondary | ICD-10-CM | POA: Insufficient documentation

## 2011-11-26 DIAGNOSIS — Z951 Presence of aortocoronary bypass graft: Secondary | ICD-10-CM | POA: Insufficient documentation

## 2011-11-26 DIAGNOSIS — I10 Essential (primary) hypertension: Secondary | ICD-10-CM | POA: Insufficient documentation

## 2011-11-26 DIAGNOSIS — I2510 Atherosclerotic heart disease of native coronary artery without angina pectoris: Secondary | ICD-10-CM | POA: Insufficient documentation

## 2011-11-26 DIAGNOSIS — I252 Old myocardial infarction: Secondary | ICD-10-CM | POA: Insufficient documentation

## 2011-11-26 DIAGNOSIS — J4489 Other specified chronic obstructive pulmonary disease: Secondary | ICD-10-CM | POA: Insufficient documentation

## 2011-11-26 DIAGNOSIS — R109 Unspecified abdominal pain: Secondary | ICD-10-CM | POA: Insufficient documentation

## 2011-11-26 DIAGNOSIS — Z87891 Personal history of nicotine dependence: Secondary | ICD-10-CM | POA: Insufficient documentation

## 2011-11-26 LAB — CBC WITH DIFFERENTIAL/PLATELET
Basophils Absolute: 0 10*3/uL (ref 0.0–0.1)
Basophils Relative: 0 % (ref 0–1)
Eosinophils Absolute: 0.1 10*3/uL (ref 0.0–0.7)
Eosinophils Relative: 2 % (ref 0–5)
HCT: 36.2 % — ABNORMAL LOW (ref 39.0–52.0)
Hemoglobin: 12.2 g/dL — ABNORMAL LOW (ref 13.0–17.0)
Lymphocytes Relative: 23 % (ref 12–46)
Lymphs Abs: 1.3 10*3/uL (ref 0.7–4.0)
MCH: 27.9 pg (ref 26.0–34.0)
MCHC: 33.7 g/dL (ref 30.0–36.0)
MCV: 82.6 fL (ref 78.0–100.0)
Monocytes Absolute: 0.6 10*3/uL (ref 0.1–1.0)
Monocytes Relative: 12 % (ref 3–12)
Neutro Abs: 3.4 10*3/uL (ref 1.7–7.7)
Neutrophils Relative %: 63 % (ref 43–77)
Platelets: 146 10*3/uL — ABNORMAL LOW (ref 150–400)
RBC: 4.38 MIL/uL (ref 4.22–5.81)
RDW: 13.7 % (ref 11.5–15.5)
WBC: 5.5 10*3/uL (ref 4.0–10.5)

## 2011-11-26 MED ORDER — ONDANSETRON HCL 4 MG/2ML IJ SOLN
4.0000 mg | Freq: Once | INTRAMUSCULAR | Status: AC
  Administered 2011-11-26: 4 mg via INTRAVENOUS
  Filled 2011-11-26: qty 2

## 2011-11-26 MED ORDER — SODIUM CHLORIDE 0.9 % IV SOLN
Freq: Once | INTRAVENOUS | Status: AC
  Administered 2011-11-26: via INTRAVENOUS

## 2011-11-26 MED ORDER — MORPHINE SULFATE 4 MG/ML IJ SOLN
2.0000 mg | Freq: Once | INTRAMUSCULAR | Status: AC
  Administered 2011-11-26: 2 mg via INTRAVENOUS
  Filled 2011-11-26: qty 1

## 2011-11-26 NOTE — ED Provider Notes (Signed)
History     CSN: [id number]  Arrival date & time 11/26/11  2224   First MD Initiated Contact with Patient 11/26/11 2301      Chief Complaint  Patient presents with  . Abdominal Pain    (Consider location/radiation/quality/duration/timing/severity/associated sxs/prior treatment) HPI   [name] is a 51 y.o. male who presents to the Emergency Department complaining of recurrent severe mid abdominal pain x 3 days that when it occurs makes him draw up in a little ball and is unable to move. Pain is located across the entire abdomen at the umbilicus . Last BM yesterday and normal. Denies fever, chills, nausea, vomiting.   PCP Dr. Modesto Charon    Past Medical History  Diagnosis Date  . Coronary artery disease     STATUS POST CABG 01/2009  . 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 Dyslipidemia   . Hypertension   . Dyspnea   . Orthostatic hypotension   . Syncope and collapse   . Chest pain   . Myocardial infarction   . COPD (chronic obstructive pulmonary disease)     Past Surgical History  Procedure Date  . Cardiac catheterization 10/19/2009  . Ankle surgery     x 2  . Sternal wires removal 07/08/2011    Procedure: STERNAL WIRES REMOVAL;  Surgeon: [name], MD;  Location: [facility] OR;  Service: Open Heart Surgery;  Laterality: N/A;  removal of 4th wire  . Pr vein bypass graft,aorto-fem-pop   . Coronary artery bypass graft 2010  . Colonoscopy April 2013    Family History  Problem Relation Age of Onset  . Coronary artery disease Father   . Diabetes Father   . Hyperlipidemia Father   . Heart attack Father   . Cancer Mother   . Hyperlipidemia Mother   . Hypertension Brother     History  Substance Use Topics  . Smoking status: Former Smoker -- 3.0 packs/day for 30 years    Types: Cigarettes, Cigars    Quit date: 01/26/2009  . Smokeless tobacco: Never Used  . Alcohol Use: No      Review of Systems  Constitutional: Negative for fever.       10 Systems reviewed and  are negative for acute change except as noted in the HPI.  HENT: Negative for congestion.   Eyes: Negative for discharge and redness.  Respiratory: Negative for cough and shortness of breath.   Cardiovascular: Negative for chest pain.  Gastrointestinal: Positive for abdominal pain. Negative for vomiting.  Musculoskeletal: Negative for back pain.  Skin: Negative for rash.  Neurological: Negative for syncope, numbness and headaches.  Psychiatric/Behavioral:       No behavior change.    Allergies  Review of patient's allergies indicates no known allergies.  Home Medications   Current Outpatient Rx  Name Route Sig Dispense Refill  . ADVAIR DISKUS 250-50 MCG/DOSE IN AEPB Oral Take 250 mg by mouth as needed.    . ASPIRIN EC 81 MG PO TBEC Oral Take 81 mg by mouth every morning.    Marland Kitchen CLOPIDOGREL BISULFATE 75 MG PO TABS Oral Take 75 mg by mouth every morning.    Marland Kitchen GABAPENTIN 300 MG PO CAPS Oral Take 300 mg by mouth every morning.     Marland Kitchen HYDROCHLOROTHIAZIDE 25 MG PO TABS Oral Take 25 mg by mouth every morning.    Marland Kitchen LOSARTAN POTASSIUM 100 MG PO TABS Oral Take 100 mg by mouth every morning.     . OXYCODONE-ACETAMINOPHEN 7.5-500  MG PO TABS Oral Take 1 tablet by mouth daily as needed. For pain    . POTASSIUM CHLORIDE CRYS ER 10 MEQ PO TBCR  TAKE 1 TABLET BY MOUTH EVERY DAY 30 tablet 10  . ROSUVASTATIN CALCIUM 20 MG PO TABS Oral Take 20 mg by mouth every morning.     Marland Kitchen TIOTROPIUM BROMIDE MONOHYDRATE 18 MCG IN CAPS Inhalation Place 18 mcg into inhaler and inhale daily.       BP 117/52  Pulse 60  Temp 98.2 F (36.8 C) (Oral)  Resp 20  Ht 5\' 9"  (1.753 m)  Wt 240 lb (108.863 kg)  BMI 35.44 kg/m2  SpO2 100%  Physical Exam  Nursing note and vitals reviewed. Constitutional: He is oriented to person, place, and time. He appears well-developed and well-nourished.       Awake, alert, nontoxic appearance.  HENT:  Head: Atraumatic.  Right Ear: External ear normal.  Left Ear: External ear normal.   Nose: Nose normal.  Eyes: Conjunctivae and EOM are normal. Pupils are equal, round, and reactive to light. Right eye exhibits no discharge. Left eye exhibits no discharge.  Neck: Neck supple.  Cardiovascular: Normal heart sounds.   Pulmonary/Chest: Effort normal and breath sounds normal. He exhibits no tenderness.  Abdominal: Soft. Bowel sounds are normal. There is tenderness. There is no rebound.       Mild tenderness that extends across the abdomen at the umbilicus level. No rebound, no guarding.  Musculoskeletal: Normal range of motion. He exhibits no tenderness.       Baseline ROM, no obvious new focal weakness.  Neurological: He is alert and oriented to person, place, and time.       Mental status and motor strength appears baseline for patient and situation.  Skin: No rash noted.  Psychiatric: He has a normal mood and affect.    ED Course  Procedures (including critical care time)  Results for orders placed during the hospital encounter of 11/26/11  CBC WITH DIFFERENTIAL      Component Value Range   WBC 5.5  4.0 - 10.5 K/uL   RBC 4.38  4.22 - 5.81 MIL/uL   Hemoglobin 12.2 (*) 13.0 - 17.0 g/dL   HCT 40.9 (*) 81.1 - 91.4 %   MCV 82.6  78.0 - 100.0 fL   MCH 27.9  26.0 - 34.0 pg   MCHC 33.7  30.0 - 36.0 g/dL   RDW 78.2  95.6 - 21.3 %   Platelets 146 (*) 150 - 400 K/uL   Neutrophils Relative 63  43 - 77 %   Neutro Abs 3.4  1.7 - 7.7 K/uL   Lymphocytes Relative 23  12 - 46 %   Lymphs Abs 1.3  0.7 - 4.0 K/uL   Monocytes Relative 12  3 - 12 %   Monocytes Absolute 0.6  0.1 - 1.0 K/uL   Eosinophils Relative 2  0 - 5 %   Eosinophils Absolute 0.1  0.0 - 0.7 K/uL   Basophils Relative 0  0 - 1 %   Basophils Absolute 0.0  0.0 - 0.1 K/uL  COMPREHENSIVE METABOLIC PANEL      Component Value Range   Sodium 140  135 - 145 mEq/L   Potassium 3.4 (*) 3.5 - 5.1 mEq/L   Chloride 104  96 - 112 mEq/L   CO2 26  19 - 32 mEq/L   Glucose, Bld 101 (*) 70 - 99 mg/dL   BUN 14  6 - 23 mg/dL  Creatinine, Ser 1.13  0.50 - 1.35 mg/dL   Calcium 9.0  8.4 - 16.1 mg/dL   Total Protein 6.9  6.0 - 8.3 g/dL   Albumin 3.9  3.5 - 5.2 g/dL   AST 24  0 - 37 U/L   ALT 21  0 - 53 U/L   Alkaline Phosphatase 73  39 - 117 U/L   Total Bilirubin 0.5  0.3 - 1.2 mg/dL   GFR calc non Af Amer 75 (*) >90 mL/min   GFR calc Af Amer 87 (*) >90 mL/min  LIPASE, BLOOD      Component Value Range   Lipase 42  11 - 59 U/L  URINALYSIS, ROUTINE W REFLEX MICROSCOPIC      Component Value Range   Color, Urine YELLOW  YELLOW   APPearance CLEAR  CLEAR   Specific Gravity, Urine 1.020  1.005 - 1.030   pH 6.0  5.0 - 8.0   Glucose, UA NEGATIVE  NEGATIVE mg/dL   Hgb urine dipstick NEGATIVE  NEGATIVE   Bilirubin Urine NEGATIVE  NEGATIVE   Ketones, ur NEGATIVE  NEGATIVE mg/dL   Protein, ur NEGATIVE  NEGATIVE mg/dL   Urobilinogen, UA 0.2  0.0 - 1.0 mg/dL   Nitrite NEGATIVE  NEGATIVE   Leukocytes, UA NEGATIVE  NEGATIVE   Dg Abd Acute W/chest  11/27/2011  *RADIOLOGY REPORT*  Clinical Data: Mid abdominal pain  ACUTE ABDOMEN SERIES (ABDOMEN 2 VIEW & CHEST 1 VIEW)  Comparison: 08/17/2011 CT  Findings: Heart size upper normal.  Status post median sternotomy and CABG.  Lungs are predominately clear.  No free intraperitoneal air. The bowel gas pattern is non- obstructive. Organ outlines are normal where seen. No acute or aggressive osseous abnormality identified.  IMPRESSION: Nonobstructive bowel gas pattern.  Original Report Authenticated By: Waneta Martins, M.D.    MDM  Patient with sharp recurrent abdominal pain x 3 days that resolves on its on and returns. Labs are unremarkable.Xray shows a normal bowel gas pattern and a moderate amount of stool. Given analgesic and antiemetic with resolution of pain. Dx testing d/w pt and gave him a paper copy of his films.   Questions answered.  Verb understanding, agreeable to d/c home with outpt f/u.Pt stable in ED with no significant deterioration in condition.The patient appears  reasonably screened and/or stabilized for discharge and I doubt any other medical condition or other [facility] requiring further screening, evaluation, or treatment in the ED at this time prior to discharge.  MDM Reviewed: nursing note and vitals Interpretation: labs and x-ray            Nicoletta Dress. Colon Branch, MD 11/27/11 0960

## 2011-11-26 NOTE — ED Notes (Signed)
Abdominal pain for the past 2 days, states pain is drawing him double, states he had an episode 2 days ago and it went away, states if has been constant for the past day

## 2011-11-27 ENCOUNTER — Ambulatory Visit: Payer: Medicaid Other | Admitting: Vascular Surgery

## 2011-11-27 ENCOUNTER — Other Ambulatory Visit: Payer: Medicaid Other

## 2011-11-27 LAB — URINALYSIS, ROUTINE W REFLEX MICROSCOPIC
Bilirubin Urine: NEGATIVE
Glucose, UA: NEGATIVE mg/dL
Hgb urine dipstick: NEGATIVE
Ketones, ur: NEGATIVE mg/dL
Leukocytes, UA: NEGATIVE
Nitrite: NEGATIVE
Protein, ur: NEGATIVE mg/dL
Specific Gravity, Urine: 1.02 (ref 1.005–1.030)
Urobilinogen, UA: 0.2 mg/dL (ref 0.0–1.0)
pH: 6 (ref 5.0–8.0)

## 2011-11-27 LAB — COMPREHENSIVE METABOLIC PANEL
ALT: 21 U/L (ref 0–53)
AST: 24 U/L (ref 0–37)
Albumin: 3.9 g/dL (ref 3.5–5.2)
Alkaline Phosphatase: 73 U/L (ref 39–117)
BUN: 14 mg/dL (ref 6–23)
CO2: 26 mEq/L (ref 19–32)
Calcium: 9 mg/dL (ref 8.4–10.5)
Chloride: 104 mEq/L (ref 96–112)
Creatinine, Ser: 1.13 mg/dL (ref 0.50–1.35)
GFR calc Af Amer: 87 mL/min — ABNORMAL LOW (ref >90)
GFR calc non Af Amer: 75 mL/min — ABNORMAL LOW (ref >90)
Glucose, Bld: 101 mg/dL — ABNORMAL HIGH (ref 70–99)
Potassium: 3.4 mEq/L — ABNORMAL LOW (ref 3.5–5.1)
Sodium: 140 mEq/L (ref 135–145)
Total Bilirubin: 0.5 mg/dL (ref 0.3–1.2)
Total Protein: 6.9 g/dL (ref 6.0–8.3)

## 2011-11-27 LAB — LIPASE, BLOOD: Lipase: 42 U/L (ref 11–59)

## 2011-11-27 NOTE — ED Notes (Signed)
Discharge instructions reviewed with pt, questions answered. Pt verbalized understanding.  

## 2011-12-11 ENCOUNTER — Encounter: Payer: Medicaid Other | Admitting: Cardiothoracic Surgery

## 2011-12-11 DIAGNOSIS — R350 Frequency of micturition: Secondary | ICD-10-CM | POA: Insufficient documentation

## 2011-12-22 NOTE — Telephone Encounter (Signed)
Explained to patient that his insurance denied the mri of his l/s spine.  I explained that Dr. Darrick Penna suggested that he return to his primary care for follow-up.  Patient was agreeable and understood.

## 2012-01-06 ENCOUNTER — Encounter (INDEPENDENT_AMBULATORY_CARE_PROVIDER_SITE_OTHER): Payer: Self-pay | Admitting: General Surgery

## 2012-01-08 ENCOUNTER — Ambulatory Visit (INDEPENDENT_AMBULATORY_CARE_PROVIDER_SITE_OTHER): Payer: Medicaid Other | Admitting: General Surgery

## 2012-01-08 ENCOUNTER — Encounter (INDEPENDENT_AMBULATORY_CARE_PROVIDER_SITE_OTHER): Payer: Self-pay | Admitting: General Surgery

## 2012-01-08 VITALS — BP 130/70 | HR 72 | Temp 98.0°F | Resp 18 | Ht 69.0 in | Wt 239.0 lb

## 2012-01-08 DIAGNOSIS — K429 Umbilical hernia without obstruction or gangrene: Secondary | ICD-10-CM

## 2012-01-08 NOTE — Progress Notes (Signed)
Patient ID: VENSON FERENCZ, male   DOB: 1960-11-08, 51 y.o.   MRN: [id number]  Chief Complaint  Patient presents with  . Umbilical Hernia    HPI FELIBERTO STOCKLEY is a 51 y.o. male the chief complaint of periumbilical pain that started approximately 2 months ago. Patient seen in the ED last week with supraumbilical pain patient and did not have any strangulation or incarceration and was sent for surgical admission.  Patient he said no problems with nausea vomiting or constipation. There are no alleviating factors the pain recurs. Patient also noticed a subxiphoid bolus and also appeared after his coronary bypass surgery, and does state he does have some pain in that site.HPI  Past Medical History  Diagnosis Date  . Coronary artery disease     STATUS POST CABG 01/2009  . 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 Dyslipidemia   . Hypertension   . Dyspnea   . Orthostatic hypotension   . Syncope and collapse   . Chest pain   . Myocardial infarction   . COPD (chronic obstructive pulmonary disease)   . Hyperlipidemia     Past Surgical History  Procedure Date  . Cardiac catheterization 10/19/2009  . Ankle surgery     x 2  . Sternal wires removal 07/08/2011    Procedure: STERNAL WIRES REMOVAL;  Surgeon: [name], MD;  Location: [facility] OR;  Service: Open Heart Surgery;  Laterality: N/A;  removal of 4th wire  . Pr vein bypass graft,aorto-fem-pop   . Coronary artery bypass graft 2010  . Colonoscopy April 2013  . Foot surgery   . Kidney stimulator     Family History  Problem Relation Age of Onset  . Coronary artery disease Father   . Diabetes Father   . Hyperlipidemia Father   . Heart attack Father   . Cancer Mother   . Hyperlipidemia Mother   . Hypertension Brother     Social History History  Substance Use Topics  . Smoking status: Former Smoker -- 3.0 packs/day for 30 years    Types: Cigarettes, Cigars    Quit date: 01/26/2009  . Smokeless tobacco: Never Used  . Alcohol Use: No     No Known Allergies  Current Outpatient Prescriptions  Medication Sig Dispense Refill  . ADVAIR DISKUS 250-50 MCG/DOSE AEPB Take 250 mg by mouth as needed.      Marland Kitchen aspirin EC 81 MG tablet Take 81 mg by mouth every morning.      . clopidogrel (PLAVIX) 75 MG tablet Take 75 mg by mouth every morning.      . gabapentin (NEURONTIN) 300 MG capsule Take 300 mg by mouth every morning.       . hydrochlorothiazide (HYDRODIURIL) 25 MG tablet Take 25 mg by mouth every morning.      Marland Kitchen losartan (COZAAR) 100 MG tablet Take 100 mg by mouth every morning.       Marland Kitchen oxyCODONE-acetaminophen (PERCOCET) 7.5-500 MG per tablet Take 1 tablet by mouth daily as needed. For pain      . potassium chloride (K-DUR,KLOR-CON) 10 MEQ tablet TAKE 1 TABLET BY MOUTH EVERY DAY  30 tablet  10  . rosuvastatin (CRESTOR) 20 MG tablet Take 20 mg by mouth every morning.       . tiotropium (SPIRIVA) 18 MCG inhalation capsule Place 18 mcg into inhaler and inhale daily.         Review of Systems Review of Systems  Constitutional: Negative.   HENT: Negative.  Eyes: Negative.   Respiratory: Negative.   Cardiovascular: Negative.   Gastrointestinal: Positive for abdominal pain (supraumbilical area).  Musculoskeletal: Negative.     Blood pressure 130/70, pulse 72, temperature 98 F (36.7 C), temperature source Oral, resp. rate 18, height 5\' 9"  (1.753 m), weight 239 lb (108.41 kg).  Physical Exam Physical Exam  Constitutional: He is oriented to person, place, and time. He appears well-developed and well-nourished.  HENT:  Head: Normocephalic and atraumatic.  Eyes: Conjunctivae normal and EOM are normal. Pupils are equal, round, and reactive to light.  Neck: Normal range of motion. Neck supple.  Cardiovascular: Normal rate, regular rhythm and normal heart sounds.   Pulmonary/Chest: Effort normal and breath sounds normal.  Abdominal: Soft.         Supraumbilical hernia defect approx 2x3cm, reducible Incisional type hernia  in the subxiphoid area.  Neurological: He is alert and oriented to person, place, and time.     Assessment    51 year old male with super umbilical hernia like incisional hernia subxiphoid area.    Plan    1. We will schedule patient for laparoscopic versus open umbilical hernia and incisional hernia.  2. Will obtain cardiac evaluation for general anesthesia for the above surgery.  3. All risks and benefits were discussed with the patient, to generally include infection, bleeding, damage to surrounding structures, and recurrence. Alternatives were offered and described.  All questions were answered and the patient voiced understanding of the procedure and wishes to proceed at this point.        Marigene Ehlers., Aava Deland 01/08/2012, 10:35 AM

## 2012-01-12 ENCOUNTER — Telehealth: Payer: Self-pay | Admitting: Cardiovascular Disease

## 2012-01-12 NOTE — Telephone Encounter (Signed)
Patient states he needs to have surgical clearance for hernia repair. Patient will be schedule for the procedure after clearance. Patient has an appointment with Dr. Elease Hashimoto on November 4 th done today. Pt needs clearance before then.

## 2012-01-12 NOTE — Telephone Encounter (Signed)
New Problem:    Patient walked in to check-out wanting to know what the status was of his surgical clearance was.  Please call back.

## 2012-01-13 NOTE — Telephone Encounter (Signed)
Received request for laparoscopic umbilical hernia repair, Dr Wrote reply he may hold plavix 7 days but request he remain on asa, pt at low risk for procedure. Will take to be faxed out.

## 2012-01-14 ENCOUNTER — Encounter (INDEPENDENT_AMBULATORY_CARE_PROVIDER_SITE_OTHER): Payer: Self-pay

## 2012-01-20 ENCOUNTER — Encounter (INDEPENDENT_AMBULATORY_CARE_PROVIDER_SITE_OTHER): Payer: Self-pay

## 2012-01-23 DIAGNOSIS — K429 Umbilical hernia without obstruction or gangrene: Secondary | ICD-10-CM

## 2012-01-23 HISTORY — PX: HERNIA REPAIR: SHX51

## 2012-01-26 ENCOUNTER — Telehealth (INDEPENDENT_AMBULATORY_CARE_PROVIDER_SITE_OTHER): Payer: Self-pay | Admitting: General Surgery

## 2012-01-26 NOTE — Telephone Encounter (Signed)
Pt called to discuss hernia operation: planned to repair umbilical and incisional hernia, but only the umbilical hernia was repaired.  Discussed with Dr. Derrell Lolling; stated only the umbilical hernia was accessible.  He will further explain to pt at his post op visit.

## 2012-02-12 ENCOUNTER — Encounter (INDEPENDENT_AMBULATORY_CARE_PROVIDER_SITE_OTHER): Payer: Self-pay | Admitting: General Surgery

## 2012-02-12 ENCOUNTER — Ambulatory Visit (INDEPENDENT_AMBULATORY_CARE_PROVIDER_SITE_OTHER): Payer: Medicaid Other | Admitting: General Surgery

## 2012-02-12 VITALS — BP 118/82 | HR 76 | Temp 97.0°F | Resp 20 | Ht 69.0 in | Wt 232.6 lb

## 2012-02-12 DIAGNOSIS — Z9889 Other specified postprocedural states: Secondary | ICD-10-CM

## 2012-02-12 NOTE — Progress Notes (Signed)
Patient ID: STANLY SI, male   DOB: 1960/10/31, 51 y.o.   MRN: [id number] Patient is a 51 year old male approximately 2 weeks postop from an umbilical hernia repair. Patient has been doing well postoperatively. He did state he had some soreness in his umbilical area but has since resolved. Visualized the zone is good pain control at this time.  On exam:  Left upper clean dry and intact well-healed. The no recurrence of his umbilical hernia.  Assessment and plan:  51 year old male status post labs umbilical hernia repair. Patient followup in one month No heavy lifting greater than 20 pounds the patient also appeared

## 2012-03-01 ENCOUNTER — Ambulatory Visit (INDEPENDENT_AMBULATORY_CARE_PROVIDER_SITE_OTHER): Payer: Medicaid Other | Admitting: Cardiovascular Disease

## 2012-03-01 ENCOUNTER — Encounter: Payer: Self-pay | Admitting: Cardiovascular Disease

## 2012-03-01 VITALS — BP 128/90 | HR 77 | Ht 69.0 in | Wt 238.0 lb

## 2012-03-01 DIAGNOSIS — I2510 Atherosclerotic heart disease of native coronary artery without angina pectoris: Secondary | ICD-10-CM

## 2012-03-01 DIAGNOSIS — E785 Hyperlipidemia, unspecified: Secondary | ICD-10-CM

## 2012-03-01 MED ORDER — ATORVASTATIN CALCIUM 40 MG PO TABS: 40.0000 mg | ORAL_TABLET | Freq: Every day | ORAL | Status: DC

## 2012-03-01 NOTE — Patient Instructions (Addendum)
Your physician recommends that you return for a FASTING lipid profile: 3 months  Your physician wants you to follow-up in: 6 months You will receive a reminder letter in the mail two months in advance. If you don't receive a letter, please call our office to schedule the follow-up appointment.  Your physician has recommended you make the following change in your medication:   Stop crestor Start atorvastatin 40 mg daily

## 2012-03-01 NOTE — Assessment & Plan Note (Signed)
He has significant CAD and PVD.  He had muscle aches with crestor.  We will try atrovastatin 40.  Check labs in 3 months.  OV in 6 months.

## 2012-03-01 NOTE — Assessment & Plan Note (Signed)
[name] is doing well.  He has not had any chest pains.  I have encouraged him to exercise as much as possible but he is limited by his ankle injury.

## 2012-03-01 NOTE — Progress Notes (Signed)
[name] Date of Birth  1960/05/14 [facility]     [place] Office  [address]    [address]   [address] [place], [place]  [address]    [place], [place]  [address] [phone]  Fax  [phone]  [phone]  Fax [phone]  Problem list: 1. Coronary artery disease: He status post CABG in October , 2010. He status post inferior wall myocardial infarction in January, 2011 with multiple stents to the native right coronary artery [name] 2. Peripheral vascular disease-status post iliac stenting 3. Dyslipidemia 4.. Hypertension  History of Present Illness:  [name] is a 51 yo with CAD and PVD.  He has had problems with his left foot and has not been able to exercise.  He continues to have lots of sternal wound pain whenever he moves his arm or lifts anything heavy. 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   [name] has had some muscle aches which resolved when he stopped his Crestor.  He has hx of severe CAD and PVD.    He has had a nagging left ankle injury that keeps him from walking much.  Current Outpatient Prescriptions on File Prior to Visit  Medication Sig Dispense Refill  . ADVAIR DISKUS 250-50 MCG/DOSE AEPB Take 250 mg by mouth as needed.      Marland Kitchen aspirin EC 81 MG tablet Take 81 mg by mouth every morning.      . clopidogrel (PLAVIX) 75 MG tablet Take 75 mg by mouth every morning.      . gabapentin (NEURONTIN) 300 MG capsule Take 300 mg by mouth every morning.       . hydrochlorothiazide (HYDRODIURIL) 25 MG tablet Take 25 mg by mouth every morning.      Marland Kitchen losartan (COZAAR) 100 MG tablet Take 100 mg by mouth every morning.       Marland Kitchen oxyCODONE-acetaminophen (PERCOCET) 7.5-500 MG per tablet Take 1 tablet by mouth daily as needed. For pain      . potassium chloride (K-DUR,KLOR-CON) 10 MEQ tablet TAKE 1 TABLET BY MOUTH EVERY DAY  30 tablet  10  . tiotropium (SPIRIVA) 18 MCG inhalation  capsule Place 18 mcg into inhaler and inhale daily.         Allergies  Allergen Reactions  . Crestor (Rosuvastatin)     Muscle aches.    Past Medical History  Diagnosis Date  . Coronary artery disease     STATUS POST CABG 01/2009  . 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 Dyslipidemia   . Hypertension   . Dyspnea   . Orthostatic hypotension   . Syncope and collapse   . Chest pain   . Myocardial infarction   . COPD (chronic obstructive pulmonary disease)   . Hyperlipidemia     Past Surgical History  Procedure Date  . Cardiac catheterization 10/19/2009  . Ankle surgery     x 2  . Sternal wires removal 07/08/2011    Procedure: STERNAL WIRES REMOVAL;  Surgeon: [name], MD;  Location: [facility] OR;  Service: Open Heart Surgery;  Laterality: N/A;  removal of 4th wire  . Pr vein bypass graft,aorto-fem-pop   . Coronary artery bypass graft 2010  . Colonoscopy April 2013  . Foot surgery   . Kidney stimulator     History  Smoking status  . Former Smoker -- 3.0 packs/day for 30 years  .  Types: Cigarettes, Cigars  . Quit date: 01/26/2009  Smokeless tobacco  . Never Used    History  Alcohol Use No    Family History  Problem Relation Age of Onset  . Coronary artery disease Father   . Diabetes Father   . Hyperlipidemia Father   . Heart attack Father   . Cancer Mother   . Hyperlipidemia Mother   . Hypertension Brother     Reviw of Systems:  Reviewed in the HPI.  All other systems are negative.  Physical Exam: Blood pressure 128/90, pulse 77, height 5\' 9"  (1.753 m), weight 238 lb (107.956 kg), SpO2 97.00%. General: Well developed, well nourished, in no acute distress.  Head: Normocephalic, atraumatic, sclera non-icteric, mucus membranes are moist,   Neck: Supple. Negative for carotid bruits. JVD not elevated.  Lungs: Clear bilaterally to auscultation without wheezes, rales, or rhonchi. Breathing is unlabored.  Heart: regular rate with S1 S2. No murmurs, rubs, or  gallops appreciated.  Abdomen: Soft, non-tender, non-distended with normoactive bowel sounds. No hepatomegaly. No rebound/guarding. No obvious abdominal masses.  Msk:  Strength and tone appear normal for age.  Extremities: No clubbing or cyanosis. No edema.  Distal pedal pulses are 2+    Neuro: Alert and oriented X 3. Moves all extremities spontaneously.  Psych:  Responds to questions appropriately with a normal affect.  ECG:  Assessment / Plan:

## 2012-03-12 ENCOUNTER — Ambulatory Visit (INDEPENDENT_AMBULATORY_CARE_PROVIDER_SITE_OTHER): Payer: Medicaid Other | Admitting: General Surgery

## 2012-03-12 ENCOUNTER — Encounter (INDEPENDENT_AMBULATORY_CARE_PROVIDER_SITE_OTHER): Payer: Self-pay | Admitting: General Surgery

## 2012-03-12 VITALS — BP 122/88 | HR 64 | Temp 97.9°F | Resp 16 | Ht 69.0 in | Wt 236.4 lb

## 2012-03-12 DIAGNOSIS — Z9889 Other specified postprocedural states: Secondary | ICD-10-CM

## 2012-03-12 NOTE — Progress Notes (Signed)
Patient ID: [name], male   DOB: 1961-02-06, 51 y.o.   MRN: [id number] The patient is a 51 year old male status post a laparoscopic umbilical hernia repair with mesh. Patient is approximately 6 weeks postop. Patient has been doing well without any complaints. Wounds healed up great  Patient this point his release from work restrictions patient to follow up when necessary.

## 2012-03-15 ENCOUNTER — Other Ambulatory Visit: Payer: Self-pay | Admitting: *Deleted

## 2012-03-15 MED ORDER — LOSARTAN POTASSIUM 100 MG PO TABS: 100.0000 mg | ORAL_TABLET | ORAL | Status: DC

## 2012-03-15 NOTE — Telephone Encounter (Signed)
Fax Received. Refill Completed. [name] (R.M.A)   

## 2012-04-15 ENCOUNTER — Other Ambulatory Visit: Payer: Self-pay | Admitting: Cardiovascular Disease

## 2012-04-15 MED ORDER — CLOPIDOGREL BISULFATE 75 MG PO TABS: 75.0000 mg | ORAL_TABLET | Freq: Every morning | ORAL | Status: DC

## 2012-04-16 ENCOUNTER — Other Ambulatory Visit: Payer: Self-pay | Admitting: Cardiovascular Disease

## 2012-05-18 DIAGNOSIS — R159 Full incontinence of feces: Secondary | ICD-10-CM | POA: Insufficient documentation

## 2012-06-01 ENCOUNTER — Other Ambulatory Visit (INDEPENDENT_AMBULATORY_CARE_PROVIDER_SITE_OTHER): Payer: Medicaid Other

## 2012-06-01 ENCOUNTER — Telehealth: Payer: Self-pay | Admitting: *Deleted

## 2012-06-01 DIAGNOSIS — E785 Hyperlipidemia, unspecified: Secondary | ICD-10-CM

## 2012-06-01 LAB — LIPID PANEL
Cholesterol: 149 mg/dL (ref 0–200)
HDL: 38.1 mg/dL — ABNORMAL LOW (ref >39.00)
LDL Cholesterol: 90 mg/dL (ref 0–99)
Total CHOL/HDL Ratio: 4
Triglycerides: 106 mg/dL (ref 0.0–149.0)
VLDL: 21.2 mg/dL (ref 0.0–40.0)

## 2012-06-01 LAB — BASIC METABOLIC PANEL
BUN: 11 mg/dL (ref 6–23)
CO2: 29 mEq/L (ref 19–32)
Calcium: 9.6 mg/dL (ref 8.4–10.5)
Chloride: 106 mEq/L (ref 96–112)
Creatinine, Ser: 1.3 mg/dL (ref 0.4–1.5)
GFR: 63.85 mL/min (ref >60.00)
Glucose, Bld: 123 mg/dL — ABNORMAL HIGH (ref 70–99)
Potassium: 4 mEq/L (ref 3.5–5.1)
Sodium: 142 mEq/L (ref 135–145)

## 2012-06-01 LAB — HEPATIC FUNCTION PANEL
ALT: 51 U/L (ref 0–53)
AST: 39 U/L — ABNORMAL HIGH (ref 0–37)
Albumin: 4 g/dL (ref 3.5–5.2)
Alkaline Phosphatase: 99 U/L (ref 39–117)
Bilirubin, Direct: 0.1 mg/dL (ref 0.0–0.3)
Total Bilirubin: 0.7 mg/dL (ref 0.3–1.2)
Total Protein: 7.4 g/dL (ref 6.0–8.3)

## 2012-06-01 NOTE — Telephone Encounter (Signed)
I will forward msg to Dr Elease Hashimoto to review and request orders.

## 2012-06-01 NOTE — Telephone Encounter (Signed)
Message copied by Antony Odea on Tue Jun 01, 2012  9:30 AM ------      Message from: Mariane Masters D      Created: Tue Jun 01, 2012  8:58 AM      Regarding: GXT/TEST       06/01/12 [name], [name] would like to have a stress test or any other test done,said he would like to have more than his bp check when he see's the MD. [phone] cell#)            Thanks

## 2012-06-02 ENCOUNTER — Telehealth: Payer: Self-pay | Admitting: *Deleted

## 2012-06-02 DIAGNOSIS — I2510 Atherosclerotic heart disease of native coronary artery without angina pectoris: Secondary | ICD-10-CM

## 2012-06-02 DIAGNOSIS — R0602 Shortness of breath: Secondary | ICD-10-CM

## 2012-06-02 DIAGNOSIS — I10 Essential (primary) hypertension: Secondary | ICD-10-CM

## 2012-06-02 DIAGNOSIS — E785 Hyperlipidemia, unspecified: Secondary | ICD-10-CM

## 2012-06-02 NOTE — Telephone Encounter (Signed)
Pt c/o increased SOB, please advise, requesting stress test.

## 2012-06-02 NOTE — Telephone Encounter (Signed)
Message copied by Antony Odea on Wed Jun 02, 2012  2:28 PM ------      Message from: Vesta Mixer      Created: Tue Jun 01, 2012  5:46 PM       Cholesterol is ok.  LDL is still a bit high.  Increase atorvastatin to 80 check fasting labs in 3 months.

## 2012-06-02 NOTE — Telephone Encounter (Signed)
PT has been having pcp dose his cholesterol medication so our MAR is not up to date and will be adjusted on next ov. Pt mentioned  Atorvastatin has been increased and had lab work 1 month ago, pt was told to continue to have his pcp dose his cholesterol med and I sent lab work to [facility], explained the dangers of having two DR's dosing his meds and pt agreed to plan.  Lexiscan was ordered for SOB

## 2012-06-02 NOTE — Telephone Encounter (Signed)
LMTCB

## 2012-06-02 NOTE — Telephone Encounter (Signed)
[name] is having more dysnea.  Please schedule a srtress myoview

## 2012-06-03 NOTE — Telephone Encounter (Signed)
done

## 2012-06-09 ENCOUNTER — Encounter (HOSPITAL_COMMUNITY): Payer: Self-pay

## 2012-06-10 ENCOUNTER — Encounter (HOSPITAL_COMMUNITY): Payer: Self-pay

## 2012-06-14 DIAGNOSIS — I2510 Atherosclerotic heart disease of native coronary artery without angina pectoris: Secondary | ICD-10-CM | POA: Insufficient documentation

## 2012-06-14 DIAGNOSIS — F1190 Opioid use, unspecified, uncomplicated: Secondary | ICD-10-CM | POA: Insufficient documentation

## 2012-06-14 DIAGNOSIS — J449 Chronic obstructive pulmonary disease, unspecified: Secondary | ICD-10-CM | POA: Insufficient documentation

## 2012-06-22 ENCOUNTER — Ambulatory Visit (HOSPITAL_COMMUNITY): Payer: Medicaid Other | Attending: Cardiology | Admitting: Radiology

## 2012-06-22 VITALS — BP 120/76 | Ht 69.0 in | Wt 243.0 lb

## 2012-06-22 DIAGNOSIS — I10 Essential (primary) hypertension: Secondary | ICD-10-CM | POA: Insufficient documentation

## 2012-06-22 DIAGNOSIS — R000 Tachycardia, unspecified: Secondary | ICD-10-CM | POA: Insufficient documentation

## 2012-06-22 DIAGNOSIS — R42 Dizziness and giddiness: Secondary | ICD-10-CM | POA: Insufficient documentation

## 2012-06-22 DIAGNOSIS — J449 Chronic obstructive pulmonary disease, unspecified: Secondary | ICD-10-CM | POA: Insufficient documentation

## 2012-06-22 DIAGNOSIS — I2510 Atherosclerotic heart disease of native coronary artery without angina pectoris: Secondary | ICD-10-CM

## 2012-06-22 DIAGNOSIS — E785 Hyperlipidemia, unspecified: Secondary | ICD-10-CM | POA: Insufficient documentation

## 2012-06-22 DIAGNOSIS — Z951 Presence of aortocoronary bypass graft: Secondary | ICD-10-CM | POA: Insufficient documentation

## 2012-06-22 DIAGNOSIS — R0602 Shortness of breath: Secondary | ICD-10-CM | POA: Insufficient documentation

## 2012-06-22 DIAGNOSIS — I252 Old myocardial infarction: Secondary | ICD-10-CM | POA: Insufficient documentation

## 2012-06-22 DIAGNOSIS — J4489 Other specified chronic obstructive pulmonary disease: Secondary | ICD-10-CM | POA: Insufficient documentation

## 2012-06-22 MED ORDER — TECHNETIUM TC 99M SESTAMIBI GENERIC - CARDIOLITE
10.0000 | Freq: Once | INTRAVENOUS | Status: AC | PRN
  Administered 2012-06-22: 10 via INTRAVENOUS

## 2012-06-22 MED ORDER — REGADENOSON 0.4 MG/5ML IV SOLN
0.4000 mg | Freq: Once | INTRAVENOUS | Status: AC
  Administered 2012-06-22: 0.4 mg via INTRAVENOUS

## 2012-06-22 MED ORDER — TECHNETIUM TC 99M SESTAMIBI GENERIC - CARDIOLITE
30.0000 | Freq: Once | INTRAVENOUS | Status: AC | PRN
  Administered 2012-06-22: 30 via INTRAVENOUS

## 2012-06-22 NOTE — Progress Notes (Signed)
[facility] SITE 3 NUCLEAR MED [address] [place], [place] [address] [phone]    Cardiology Nuclear Med Study  [name] is a 52 y.o. male     MRN : [id number]     DOB: 03/16/1961  Procedure Date: 06/22/2012  Nuclear Med Background Indication for Stress Test:  Evaluation for Ischemia, Surgical Clearance, Graft Patency and Stent Patency History:  COPD and 10/10 CABGx5;1/11 Angioplasty;10'Echo;6/11Heart Catheterization;1/11 Myocardial Infarction IWMI;1/11 Stents x2 native RCA Cardiac Risk Factors: Claudication, Family History - CAD, History of Smoking, Hypertension, Lipids and PVD  Symptoms:  Dizziness, DOE, Light-Headedness, Rapid HR and SOB   Nuclear Pre-Procedure Caffeine/Decaff Intake:  None NPO After: 7:00pm   Lungs:  clear O2 Sat: 98% on room air. IV 0.9% NS with Angio Cath:  20g  IV Site: R Antecubital  IV Started by:  Milana Na, EMT-P  Chest Size (in):  46 Cup Size: n/a  Height: 5\' 9"  (1.753 m)  Weight:  243 lb (110.224 kg)  BMI:  Body mass index is 35.87 kg/(m^2). Tech Comments:  No RX this am    Nuclear Med Study 1 or 2 day study: 1 day  Stress Test Type:  Eugenie Birks  Reading MD: [name], MD  Order Authorizing Provider:  P.Nahser MD  Resting Radionuclide: Technetium 77m Sestamibi  Resting Radionuclide Dose: 11.0 mCi   Stress Radionuclide:  Technetium 74m Sestamibi  Stress Radionuclide Dose: 32.8 mCi           Stress Protocol Rest HR: 51 Stress HR: 74  Rest BP: 120/76 Stress BP: 120/74  Exercise Time (min): n/a METS: n/a   Predicted Max HR: 171 bpm % Max HR: 43.27 bpm Rate Pressure Product: 8880   Dose of Adenosine (mg):  n/a Dose of Lexiscan: 0.4 mg  Dose of Atropine (mg): n/a Dose of Dobutamine: n/a mcg/kg/min (at max HR)  Stress Test Technologist: Frederick Peers, EMT-P  Nuclear Technologist:  Domenic Polite, CNMT     Rest Procedure:  Myocardial perfusion imaging was performed at rest 45 minutes following the intravenous  administration of Technetium 50m Sestamibi. Rest ECG: Sinus rhythm; PRWP  Stress Procedure:  The patient received IV Lexiscan 0.4 mg over 15-seconds.  Technetium 8m Sestamibi injected at 30-seconds.  Quantitative spect images were obtained after a 45 minute delay. Stress ECG: No significant ST segment change suggestive of ischemia.  QPS Raw Data Images:  Acquisition technically good; LVE. Stress Images:  There is decreased uptake in the inferior wall. Rest Images:  Normal homogeneous uptake in all areas of the myocardium. Subtraction (SDS):  These findings are consistent with ischemia. Transient Ischemic Dilatation (Normal <1.22):  1.03 Lung/Heart Ratio (Normal <0.45):  0.25  Quantitative Gated Spect Images QGS EDV:  146 ml QGS ESV:  72 ml  Impression Exercise Capacity:  Lexiscan with no exercise. BP Response:  Normal blood pressure response. Clinical Symptoms:  There is dyspnea. ECG Impression:  No significant ST segment change suggestive of ischemia. Comparison with Prior Nuclear Study: No images to compare  Overall Impression:  Low risk stress nuclear study with a small, moderate intensity, reversible inferobasal defect consistent with very mild inferior ischemia.  LV Ejection Fraction: 50%.  LV Wall Motion:  NL LV Function; NL Wall Motion  [name]

## 2012-07-06 ENCOUNTER — Ambulatory Visit
Admission: RE | Admit: 2012-07-06 | Discharge: 2012-07-06 | Disposition: A | Discharge status: Home / Self Care | Payer: Medicaid Other | Source: Ambulatory Visit | Attending: Nurse Practitioner | Admitting: Nurse Practitioner

## 2012-07-06 ENCOUNTER — Encounter: Payer: Self-pay | Admitting: Nurse Practitioner

## 2012-07-06 ENCOUNTER — Ambulatory Visit (INDEPENDENT_AMBULATORY_CARE_PROVIDER_SITE_OTHER): Payer: Medicaid Other | Admitting: Nurse Practitioner

## 2012-07-06 ENCOUNTER — Encounter (HOSPITAL_COMMUNITY): Payer: Self-pay | Admitting: Pharmacy Technician

## 2012-07-06 VITALS — BP 130/88 | HR 56 | Ht 69.0 in | Wt 240.4 lb

## 2012-07-06 DIAGNOSIS — R0600 Dyspnea, unspecified: Secondary | ICD-10-CM

## 2012-07-06 DIAGNOSIS — R079 Chest pain, unspecified: Secondary | ICD-10-CM

## 2012-07-06 DIAGNOSIS — R0609 Other forms of dyspnea: Secondary | ICD-10-CM

## 2012-07-06 DIAGNOSIS — R0989 Other specified symptoms and signs involving the circulatory and respiratory systems: Secondary | ICD-10-CM

## 2012-07-06 LAB — CBC WITH DIFFERENTIAL/PLATELET
Basophils Absolute: 0 10*3/uL (ref 0.0–0.1)
Basophils Relative: 0.5 % (ref 0.0–3.0)
Eosinophils Absolute: 0.1 10*3/uL (ref 0.0–0.7)
Eosinophils Relative: 1.9 % (ref 0.0–5.0)
HCT: 38.8 % — ABNORMAL LOW (ref 39.0–52.0)
Hemoglobin: 13 g/dL (ref 13.0–17.0)
Lymphocytes Relative: 24.1 % (ref 12.0–46.0)
Lymphs Abs: 1.4 10*3/uL (ref 0.7–4.0)
MCHC: 33.5 g/dL (ref 30.0–36.0)
MCV: 84.6 fl (ref 78.0–100.0)
Monocytes Absolute: 0.5 10*3/uL (ref 0.1–1.0)
Monocytes Relative: 7.7 % (ref 3.0–12.0)
Neutro Abs: 3.9 10*3/uL (ref 1.4–7.7)
Neutrophils Relative %: 65.8 % (ref 43.0–77.0)
Platelets: 222 10*3/uL (ref 150.0–400.0)
RBC: 4.58 Mil/uL (ref 4.22–5.81)
RDW: 13.6 % (ref 11.5–14.6)
WBC: 6 10*3/uL (ref 4.5–10.5)

## 2012-07-06 LAB — BASIC METABOLIC PANEL
BUN: 12 mg/dL (ref 6–23)
CO2: 27 mEq/L (ref 19–32)
Calcium: 9.1 mg/dL (ref 8.4–10.5)
Chloride: 107 mEq/L (ref 96–112)
Creatinine, Ser: 1.2 mg/dL (ref 0.4–1.5)
GFR: 68.14 mL/min (ref >60.00)
Glucose, Bld: 94 mg/dL (ref 70–99)
Potassium: 3.5 mEq/L (ref 3.5–5.1)
Sodium: 141 mEq/L (ref 135–145)

## 2012-07-06 LAB — APTT: aPTT: 31 seconds (ref 24–37)

## 2012-07-06 LAB — PROTIME-INR
INR: 0.94 (ref <1.50)
Prothrombin Time: 12.5 seconds (ref 11.6–15.2)

## 2012-07-06 LAB — D-DIMER, QUANTITATIVE: D-Dimer, Quant: 0.27 ug/mL-FEU (ref 0.00–0.48)

## 2012-07-06 LAB — BRAIN NATRIURETIC PEPTIDE: Pro B Natriuretic peptide (BNP): 87 pg/mL (ref 0.0–100.0)

## 2012-07-06 NOTE — Addendum Note (Signed)
Addended by: Tonita Phoenix on: 07/06/2012 04:19 PM   Modules accepted: Orders

## 2012-07-06 NOTE — Progress Notes (Signed)
[name] Date of Birth: 02/20/1961 Medical Record #[id number]  History of Present Illness: [name] is seen back today for a work in visit. He is seen for Dr. Elease Hashimoto. He has known CAD with prior CABG in October of 2010. Had inferior MI in January of 2011 with multiple stents to the native RCA per [name]. Other issues include PVD with prior iliac stenting, HLD and HTN. He is a former smoker. He has had chronic sternal pain related to heavy lifting and movement.   He comes in today. He is here alone. He is complaining of dyspnea. Called last month and had a stress test set up. Results are as noted below. Felt to be a low risk study but with some mild ischemia. Normal EF and normal wall motion. He says he is getting more short of breath. Has chronic chest pain - does not sound like it is any worse but no better. Wants to know "100%" if he is ok and wants to proceed on with cath. Not smoking. Has gotten very sedentary since he had a leg injury. Has been in a cast up until a month ago. Has had 3 surgeries on his left leg. No calf pain. No real swelling of his leg. No cough.   Current Outpatient Prescriptions on File Prior to Visit  Medication Sig Dispense Refill  . ADVAIR DISKUS 250-50 MCG/DOSE AEPB Take 250 mg by mouth as needed.      Marland Kitchen aspirin EC 81 MG tablet Take 81 mg by mouth every morning.      Marland Kitchen atorvastatin (LIPITOR) 40 MG tablet Take 1 tablet (40 mg total) by mouth daily.  90 tablet  1  . clopidogrel (PLAVIX) 75 MG tablet Take 75 mg by mouth daily.      . clopidogrel (PLAVIX) 75 MG tablet Take 1 tablet (75 mg total) by mouth every morning.  30 tablet  11  . gabapentin (NEURONTIN) 300 MG capsule Take 300 mg by mouth every morning.       . hydrochlorothiazide (HYDRODIURIL) 25 MG tablet Take 25 mg by mouth every morning.      Marland Kitchen losartan (COZAAR) 100 MG tablet Take 1 tablet (100 mg total) by mouth every morning.  30 tablet  5  . potassium chloride (K-DUR,KLOR-CON) 10 MEQ tablet TAKE  1 TABLET BY MOUTH EVERY DAY  30 tablet  10  . tiotropium (SPIRIVA) 18 MCG inhalation capsule Place 18 mcg into inhaler and inhale daily.        No current facility-administered medications on file prior to visit.    Allergies  Allergen Reactions  . Crestor (Rosuvastatin)     Muscle aches.    Past Medical History  Diagnosis Date  . Coronary artery disease     STATUS POST CABG 01/2009  . 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 Dyslipidemia   . Hypertension   . Dyspnea   . Orthostatic hypotension   . Syncope and collapse   . Chest pain   . Myocardial infarction   . COPD (chronic obstructive pulmonary disease)   . Hyperlipidemia     Past Surgical History  Procedure Laterality Date  . Cardiac catheterization  10/19/2009  . Ankle surgery      x 2  . Sternal wires removal  07/08/2011    Procedure: STERNAL WIRES REMOVAL;  Surgeon: [name], MD;  Location: [facility] OR;  Service: Open Heart Surgery;  Laterality: N/A;  removal of 4th wire  . Pr vein  bypass graft,aorto-fem-pop    . Coronary artery bypass graft  2010  . Colonoscopy  April 2013  . Foot surgery    . Kidney stimulator    . Hernia repair  01/23/12    umb hernia    History  Smoking status  . Former Smoker -- 3.00 packs/day for 30 years  . Types: Cigarettes, Cigars  . Quit date: 01/26/2009  Smokeless tobacco  . Never Used    History  Alcohol Use No    Family History  Problem Relation Age of Onset  . Coronary artery disease Father   . Diabetes Father   . Hyperlipidemia Father   . Heart attack Father   . Cancer Mother     uterine  . Hyperlipidemia Mother   . Hypertension Brother     Review of Systems: The review of systems is per the HPI.  All other systems were reviewed and are negative.  Physical Exam: Ht 5\' 9"  (1.753 m)  Wt 240 lb 6.4 oz (109.045 kg)  BMI 35.48 kg/m2 Patient appears much older than his stated age but in no acute distress. Skin is warm and dry. Color is normal.  HEENT is  unremarkable. Normocephalic/atraumatic. PERRL. Sclera are nonicteric. Neck is supple. No masses. No JVD. Lungs are coarse. Cardiac exam shows a regular rate and rhythm. Abdomen is soft. Extremities are without edema. Gait and ROM are intact. No gross neurologic deficits noted.  LABORATORY DATA: Pending    Chemistry      Component Value Date/Time   NA 142 06/01/2012 0849   K 4.0 06/01/2012 0849   CL 106 06/01/2012 0849   CO2 29 06/01/2012 0849   BUN 11 06/01/2012 0849   CREATININE 1.3 06/01/2012 0849      Component Value Date/Time   CALCIUM 9.6 06/01/2012 0849   ALKPHOS 99 06/01/2012 0849   AST 39* 06/01/2012 0849   ALT 51 06/01/2012 0849   BILITOT 0.7 06/01/2012 0849     Lab Results  Component Value Date   CHOL 149 06/01/2012   HDL 38.10* 06/01/2012   LDLCALC 90 06/01/2012   TRIG 106.0 06/01/2012   CHOLHDL 4 06/01/2012   Overall Impression: Low risk stress nuclear study with a small, moderate intensity, reversible inferobasal defect consistent with very mild inferior ischemia.  LV Ejection Fraction: 50%. LV Wall Motion: NL LV Function; NL Wall Motion  [name]   Assessment / Plan: 1. CAD - with past CABG, followed by inferior MI and stents to the RCA - low risk Myoview but with reversible inferobasal defect consistent with very mild inferior ischemia. He wants to proceed on with cardiac catheterization. The procedure has been reviewed along with the risks (bleeding, bruising, allergy to dye, irregular rhythms, stroke, MI and even death) /benefits and he is willing to proceed.   2. Dyspnea - check CXR and BNP/d dimer today  3. HLD  Call the [facility] office at [phone] if you have any questions, problems or concerns.

## 2012-07-06 NOTE — Patient Instructions (Signed)
We will arrange for a heart catheterization with Dr. Elease Hashimoto this Thursday  Stay on your current medicines  We need to check labs today  We need to send you for a chest xray - go to [facility] on the first floor at [facility] and walk in   You are scheduled for a cardiac catheterization on Thursday, March 13th with Dr. Elease Hashimoto or associate.  Go to [facility] 2nd Floor Short Stay on Thursday, March 13th at 5:30 am.  Your procedure is scheduled for 7:30am No food or drink after midnight on Wednesday. You may take your medications with a sip of water on the day of your procedure.   Call the [facility] office at [phone] if you have any questions, problems or concerns.

## 2012-07-06 NOTE — Addendum Note (Signed)
Addended by: Talik Casique K on: 07/06/2012 10:56 AM   Modules accepted: Orders  

## 2012-07-06 NOTE — Addendum Note (Signed)
Addended by: Vail Vuncannon K on: 07/06/2012 04:19 PM   Modules accepted: Orders  

## 2012-07-06 NOTE — Addendum Note (Signed)
Addended by: Tonita Phoenix on: 07/06/2012 10:56 AM   Modules accepted: Orders

## 2012-07-08 ENCOUNTER — Ambulatory Visit (HOSPITAL_COMMUNITY)
Admission: RE | Admit: 2012-07-08 | Discharge: 2012-07-08 | Disposition: A | Discharge status: Home / Self Care | Payer: Medicaid Other | Source: Ambulatory Visit | Attending: Cardiovascular Disease | Admitting: Cardiovascular Disease

## 2012-07-08 ENCOUNTER — Encounter (HOSPITAL_COMMUNITY)
Admission: RE | Disposition: A | Discharge status: Home / Self Care | Payer: Self-pay | Source: Ambulatory Visit | Attending: Cardiovascular Disease

## 2012-07-08 DIAGNOSIS — Z87891 Personal history of nicotine dependence: Secondary | ICD-10-CM | POA: Insufficient documentation

## 2012-07-08 DIAGNOSIS — I252 Old myocardial infarction: Secondary | ICD-10-CM | POA: Insufficient documentation

## 2012-07-08 DIAGNOSIS — E785 Hyperlipidemia, unspecified: Secondary | ICD-10-CM | POA: Insufficient documentation

## 2012-07-08 DIAGNOSIS — I25810 Atherosclerosis of coronary artery bypass graft(s) without angina pectoris: Secondary | ICD-10-CM | POA: Insufficient documentation

## 2012-07-08 DIAGNOSIS — I209 Angina pectoris, unspecified: Secondary | ICD-10-CM | POA: Insufficient documentation

## 2012-07-08 DIAGNOSIS — I2582 Chronic total occlusion of coronary artery: Secondary | ICD-10-CM | POA: Insufficient documentation

## 2012-07-08 DIAGNOSIS — I2510 Atherosclerotic heart disease of native coronary artery without angina pectoris: Secondary | ICD-10-CM

## 2012-07-08 DIAGNOSIS — Z9861 Coronary angioplasty status: Secondary | ICD-10-CM | POA: Insufficient documentation

## 2012-07-08 HISTORY — PX: LEFT HEART CATHETERIZATION WITH CORONARY/GRAFT ANGIOGRAM: SHX5450

## 2012-07-08 SURGERY — LEFT HEART CATHETERIZATION WITH CORONARY/GRAFT ANGIOGRAM
Anesthesia: LOCAL

## 2012-07-08 MED ORDER — SODIUM CHLORIDE 0.9 % IJ SOLN: 3.0000 mL | Freq: Two times a day (BID) | INTRAMUSCULAR | Status: DC

## 2012-07-08 MED ORDER — LIDOCAINE HCL (PF) 1 % IJ SOLN
INTRAMUSCULAR | Status: AC
  Filled 2012-07-08: qty 30

## 2012-07-08 MED ORDER — ASPIRIN 81 MG PO CHEW
CHEWABLE_TABLET | ORAL | Status: AC
  Filled 2012-07-08: qty 4

## 2012-07-08 MED ORDER — OXYCODONE-ACETAMINOPHEN 5-325 MG PO TABS
2.0000 | ORAL_TABLET | Freq: Once | ORAL | Status: AC
  Administered 2012-07-08: 2 via ORAL

## 2012-07-08 MED ORDER — OXYCODONE-ACETAMINOPHEN 5-325 MG PO TABS
ORAL_TABLET | ORAL | Status: AC
  Filled 2012-07-08: qty 2

## 2012-07-08 MED ORDER — ONDANSETRON HCL 4 MG/2ML IJ SOLN: 4.0000 mg | Freq: Four times a day (QID) | INTRAMUSCULAR | Status: DC | PRN

## 2012-07-08 MED ORDER — SODIUM CHLORIDE 0.9 % IJ SOLN: 3.0000 mL | INTRAMUSCULAR | Status: DC | PRN

## 2012-07-08 MED ORDER — SODIUM CHLORIDE 0.9 % IV SOLN
INTRAVENOUS | Status: DC
  Administered 2012-07-08: 06:00:00 via INTRAVENOUS

## 2012-07-08 MED ORDER — ASPIRIN 81 MG PO CHEW
324.0000 mg | CHEWABLE_TABLET | ORAL | Status: AC
  Administered 2012-07-08: 324 mg via ORAL

## 2012-07-08 MED ORDER — FENTANYL CITRATE 0.05 MG/ML IJ SOLN
INTRAMUSCULAR | Status: AC
  Filled 2012-07-08: qty 2

## 2012-07-08 MED ORDER — SODIUM CHLORIDE 0.9 % IV SOLN: INTRAVENOUS | Status: DC

## 2012-07-08 MED ORDER — MIDAZOLAM HCL 2 MG/2ML IJ SOLN
INTRAMUSCULAR | Status: AC
  Filled 2012-07-08: qty 2

## 2012-07-08 MED ORDER — HEPARIN (PORCINE) IN NACL 2-0.9 UNIT/ML-% IJ SOLN
INTRAMUSCULAR | Status: AC
  Filled 2012-07-08: qty 1000

## 2012-07-08 MED ORDER — ACETAMINOPHEN 325 MG PO TABS: 650.0000 mg | ORAL_TABLET | ORAL | Status: DC | PRN

## 2012-07-08 MED ORDER — SODIUM CHLORIDE 0.9 % IV SOLN: 250.0000 mL | INTRAVENOUS | Status: DC | PRN

## 2012-07-08 NOTE — H&P (Signed)
[name]  Date of Birth: [birth year]  Medical Record #[id number]  History of Present Illness:  [name] is seen back today for a work in visit. He was seen by [name] this past week.   He has known CAD with prior CABG in October of 2010. Had inferior MI in January of 2011 with multiple stents to the native RCA per [name]. Other issues include PVD with prior iliac stenting, HLD and HTN. He is a former smoker. He has had chronic sternal pain related to heavy lifting and movement.   He is complaining of dyspnea. Called last month and had a stress test set up. Results are as noted below. Felt to be a low risk study but with some mild ischemia. Normal EF and normal wall motion. He says he is getting more short of breath. Has chronic chest pain - does not sound like it is any worse but no better. Wants to know "100%" if he is ok and wants to proceed on with cath. Not smoking. Has gotten very sedentary since he had a leg injury. Has been in a cast up until a month ago. Has had 3 surgeries on his left leg. No calf pain. No real swelling of his leg. No cough.  Current Outpatient Prescriptions on File Prior to Visit   Medication  Sig  Dispense  Refill   .  ADVAIR DISKUS 250-50 MCG/DOSE AEPB  Take 250 mg by mouth as needed.     Marland Kitchen  aspirin EC 81 MG tablet  Take 81 mg by mouth every morning.     Marland Kitchen  atorvastatin (LIPITOR) 40 MG tablet  Take 1 tablet (40 mg total) by mouth daily.  90 tablet  1   .  clopidogrel (PLAVIX) 75 MG tablet  Take 75 mg by mouth daily.     .  clopidogrel (PLAVIX) 75 MG tablet  Take 1 tablet (75 mg total) by mouth every morning.  30 tablet  11   .  gabapentin (NEURONTIN) 300 MG capsule  Take 300 mg by mouth every morning.     .  hydrochlorothiazide (HYDRODIURIL) 25 MG tablet  Take 25 mg by mouth every morning.     Marland Kitchen  losartan (COZAAR) 100 MG tablet  Take 1 tablet (100 mg total) by mouth every morning.  30 tablet  5   .  potassium chloride (K-DUR,KLOR-CON) 10 MEQ tablet  TAKE 1 TABLET  BY MOUTH EVERY DAY  30 tablet  10   .  tiotropium (SPIRIVA) 18 MCG inhalation capsule  Place 18 mcg into inhaler and inhale daily.      No current facility-administered medications on file prior to visit.    Allergies   Allergen  Reactions   .  Crestor (Rosuvastatin)      Muscle aches.    Past Medical History   Diagnosis  Date   .  Coronary artery disease      STATUS POST CABG 01/2009   .  Acute MI, inferior wall  04/2009   .  Dyslipidemia    .  Hypertension    .  Dyspnea    .  Orthostatic hypotension    .  Syncope and collapse    .  Chest pain    .  Myocardial infarction    .  COPD (chronic obstructive pulmonary disease)    .  Hyperlipidemia     Past Surgical History   Procedure  Laterality  Date   .  Cardiac catheterization  10/19/2009   .  Ankle surgery       x 2   .  Sternal wires removal   07/08/2011     Procedure: STERNAL WIRES REMOVAL; Surgeon: [name], MD; Location: [facility] OR; Service: Open Heart Surgery; Laterality: N/A; removal of 4th wire   .  Pr vein bypass graft,aorto-fem-pop     .  Coronary artery bypass graft   2010   .  Colonoscopy   April 2013   .  Foot surgery     .  Kidney stimulator     .  Hernia repair   01/23/12     umb hernia    History   Smoking status   .  Former Smoker -- 3.00 packs/day for 30 years   .  Types:  Cigarettes, Cigars   .  Quit date:  01/26/2009   Smokeless tobacco   .  Never Used    History   Alcohol Use  No    Family History   Problem  Relation  Age of Onset   .  Coronary artery disease  Father    .  Diabetes  Father    .  Hyperlipidemia  Father    .  Heart attack  Father    .  Cancer  Mother      uterine   .  Hyperlipidemia  Mother    .  Hypertension  Brother    Review of Systems:  The review of systems is per the HPI. All other systems were reviewed and are negative.  Physical Exam:  Ht 5\' 9"  (1.753 m)  Wt 240 lb 6.4 oz (109.045 kg)  BMI 35.48 kg/m2  Patient appears much older than his stated age but in  no acute distress. Skin is warm and dry. Color is normal. HEENT is unremarkable. Normocephalic/atraumatic. PERRL. Sclera are nonicteric. Neck is supple. No masses. No JVD. Lungs are coarse. Cardiac exam shows a regular rate and rhythm. Abdomen is soft. Extremities are without edema. Gait and ROM are intact. No gross neurologic deficits noted.  LABORATORY DATA: Pending  Chemistry       Component  Value  Date/Time  Component  Value  Date/Time    NA  142  06/01/2012 0849   CALCIUM  9.6  06/01/2012 0849    K  4.0  06/01/2012 0849   ALKPHOS  99  06/01/2012 0849    CL  106  06/01/2012 0849   AST  39*  06/01/2012 0849    CO2  29  06/01/2012 0849   ALT  51  06/01/2012 0849    BUN  11  06/01/2012 0849   BILITOT  0.7  06/01/2012 0849    CREATININE  1.3  06/01/2012 0849     Lab Results   Component  Value  Date    CHOL  149  06/01/2012    HDL  38.10*  06/01/2012    LDLCALC  90  06/01/2012    TRIG  106.0  06/01/2012    CHOLHDL  4  06/01/2012   Overall Impression: Low risk stress nuclear study with a small, moderate intensity, reversible inferobasal defect consistent with very mild inferior ischemia.  LV Ejection Fraction: 50%. LV Wall Motion: NL LV Function; NL Wall Motion  [name]  Assessment / Plan:  1. CAD - with past CABG, followed by inferior MI and stents to the RCA - low risk Myoview but with reversible inferobasal defect consistent with very mild inferior ischemia.  He wants to proceed on with cardiac catheterization. The procedure has been reviewed along with the risks (bleeding, bruising, allergy to dye, irregular rhythms, stroke, MI and even death) /benefits and he is willing to proceed.  2. Dyspnea - check CXR and BNP/d dimer today  3. HLD   Alvia Grove., MD, [facility] 07/08/2012, 7:38 AM Office - [phone] Pager [phone]

## 2012-07-08 NOTE — CV Procedure (Signed)
    Cardiac Cath Note  CAMDON SAETERN [id number] 1960/11/30  Procedure: left  Heart Cardiac Catheterization Note Indications: chest pain, abnormal stress test  Procedure Details Consent: Obtained Time Out: Verified patient identification, verified procedure, site/side was marked, verified correct patient position, special equipment/implants available, Radiology Safety Procedures followed,  medications/allergies/relevent history reviewed, required imaging and test results available.  Performed   Medications: Fentanyl: 50 mcg IV Versed: 2 mg IV  The right femoral artery was easily canulated using a modified Seldinger technique.  Hemodynamics:    LV pressure: 102/17 Aortic pressure: 106/60  Angiography   Left Main: no significant stenosis  Left anterior Descending: proximal 50-60% stenosis.  The distal LAD fills via the [place] .    Left Circumflex:  OM1 - 70% proximal disease  Ramus Intermediate:  Occluded proximally  Right Coronary Artery:  Initially, the JR4 catheter would not engage the RCA - I suspect the proximal stent is hanging out in the aorta slightly.  With re-try , we were able to engage the RCA .  The is slight in-stent restenosis of the proximal stent - 30-40%, the remaining RCA has minor luminal irregularities.   The PDA has minor luminal irregularities.  There is a small inferior branch with a 70 % stenosis in the proximal segment.  This branch is too small to consider for PCI. The distal aspect of the RCA fills via left to right collaterals.   SVG to RCA:  Occluded prox   SVG to Ramus Intermediate:  patent  SVG to OM:  Large graft,  anastomosis is normal   LV Gram: overall LV function is well preserved,  EF 50-55%,  Inferior hypokinesis  Complications: No apparent complications Patient did tolerate procedure well.  Contrast used: 100 cc  Conclusions:   1. CAD:  The LIMA, SVG to OM, SVG to RI are patent.  The SVG to RCA is closed but the native RCA is  open.  He has small branch disease that may be causing his angina but no lesions that are amenable to PCI.  Well preserved LV function.  Vesta Mixer, Montez Hageman., MD, [facility] 07/08/2012, 8:25 AM Office - [phone] Pager [phone]

## 2012-07-09 ENCOUNTER — Telehealth: Payer: Self-pay | Admitting: *Deleted

## 2012-07-09 NOTE — Telephone Encounter (Signed)
Pt has questions regarding his LHC results, please call to discuss further.

## 2012-07-09 NOTE — Telephone Encounter (Signed)
I have called patient  

## 2012-07-13 ENCOUNTER — Encounter (HOSPITAL_COMMUNITY): Payer: Self-pay

## 2012-07-16 ENCOUNTER — Telehealth: Payer: Self-pay | Admitting: Cardiovascular Disease

## 2012-07-16 NOTE — Telephone Encounter (Signed)
I was unable to find a reason why someone called Mr Armbrust.  I notified him of this.  He states he was called today and whoever called did not leave a message.

## 2012-07-16 NOTE — Telephone Encounter (Signed)
New Problem:    Called in because someone called them and they would like to know why.  Please call back.

## 2012-08-20 ENCOUNTER — Other Ambulatory Visit: Payer: Self-pay | Admitting: *Deleted

## 2012-08-20 MED ORDER — POTASSIUM CHLORIDE ER 10 MEQ PO TBCR: 10.0000 meq | EXTENDED_RELEASE_TABLET | Freq: Every morning | ORAL | Status: DC

## 2012-08-20 NOTE — Telephone Encounter (Signed)
Fax Received. Refill Completed. [name] (R.M.A)   

## 2012-09-06 ENCOUNTER — Encounter: Payer: Self-pay | Admitting: Cardiovascular Disease

## 2012-09-06 ENCOUNTER — Ambulatory Visit (INDEPENDENT_AMBULATORY_CARE_PROVIDER_SITE_OTHER): Payer: Medicaid Other | Admitting: Cardiovascular Disease

## 2012-09-06 VITALS — BP 114/80 | HR 54 | Ht 69.0 in | Wt 247.0 lb

## 2012-09-06 DIAGNOSIS — I2510 Atherosclerotic heart disease of native coronary artery without angina pectoris: Secondary | ICD-10-CM

## 2012-09-06 DIAGNOSIS — R079 Chest pain, unspecified: Secondary | ICD-10-CM

## 2012-09-06 NOTE — Progress Notes (Signed)
[name] Date of Birth  October 31, 1960 [facility]     [place] [facility]  [address]    [address]   [address] [place], [place]  [address]    [place], [place]  [address] [phone]  Fax  [phone]  [phone]  Fax [phone]  Problem list: 1. Coronary artery disease: He status post CABG in October , 2010. He status post inferior wall myocardial infarction in January, 2011 with multiple stents to the native right coronary artery [name] 2. Peripheral vascular disease-status post iliac stenting 3. Dyslipidemia 4.. Hypertension  History of Present Illness:  [name] is a 52 yo with CAD and PVD.  He has had problems with his left foot and has not been able to exercise.  He continues to have lots of sternal wound pain whenever he moves his arm or lifts anything heavy. 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   Lynda has had some muscle aches which resolved when he stopped his Crestor.  He has hx of severe CAD and PVD.    He has had a nagging left ankle injury that keeps him from walking much.  May 12 ,2014:  He continues to have occasional CP and frequent episodes of dyspnea. He has been told that he has COPD.   He is still in a left leg splint.  Current Outpatient Prescriptions on File Prior to Visit  Medication Sig Dispense Refill  . ADVAIR DISKUS 250-50 MCG/DOSE AEPB Take 2 puffs by mouth daily as needed (for trouble breathing).       Marland Kitchen aspirin EC 81 MG tablet Take 81 mg by mouth every morning.      Marland Kitchen atorvastatin (LIPITOR) 40 MG tablet Take 1 tablet (40 mg total) by mouth daily.  90 tablet  1  . clopidogrel (PLAVIX) 75 MG tablet Take 1 tablet (75 mg total) by mouth every morning.  30 tablet  11  . gabapentin (NEURONTIN) 300 MG capsule Take 300 mg by mouth every morning.       . hydrochlorothiazide (HYDRODIURIL) 25 MG tablet Take 25 mg by mouth every morning.      Marland Kitchen losartan  (COZAAR) 100 MG tablet Take 1 tablet (100 mg total) by mouth every morning.  30 tablet  5  . potassium chloride (K-DUR) 10 MEQ tablet Take 1 tablet (10 mEq total) by mouth every morning.  30 tablet  5  . tiotropium (SPIRIVA) 18 MCG inhalation capsule Place 18 mcg into inhaler and inhale daily.        No current facility-administered medications on file prior to visit.    Allergies  Allergen Reactions  . Crestor (Rosuvastatin)     Muscle aches.    Past Medical History  Diagnosis Date  . Coronary artery disease     STATUS POST CABG 01/2009  . 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 Dyslipidemia   . Hypertension   . Dyspnea   . Orthostatic hypotension   . Syncope and collapse   . Chest pain   . Myocardial infarction   . COPD (chronic obstructive pulmonary disease)   . Hyperlipidemia     Past Surgical History  Procedure Laterality Date  . Cardiac catheterization  10/19/2009  . Ankle surgery      x 2  . Sternal wires removal  07/08/2011    Procedure: STERNAL WIRES REMOVAL;  Surgeon: [name], MD;  Location: [facility] OR;  Service: Open Heart Surgery;  Laterality: N/A;  removal of 4th wire  . Pr vein bypass graft,aorto-fem-pop    . Coronary artery bypass graft  2010  . Colonoscopy  April 2013  . Foot surgery    . Kidney stimulator    . Hernia repair  01/23/12    umb hernia    History  Smoking status  . Former Smoker -- 3.00 packs/day for 30 years  . Types: Cigarettes, Cigars  . Quit date: 01/26/2009  Smokeless tobacco  . Never Used    History  Alcohol Use No    Family History  Problem Relation Age of Onset  . Coronary artery disease Father   . Diabetes Father   . Hyperlipidemia Father   . Heart attack Father   . Cancer Mother     uterine  . Hyperlipidemia Mother   . Hypertension Brother     Reviw of Systems:  Reviewed in the HPI.  All other systems are negative.  Physical Exam: Blood pressure 114/80, pulse 54, height 5\' 9"  (1.753 m), weight 247 lb (112.038  kg). General: Well developed, well nourished, in no acute distress.  Head: Normocephalic, atraumatic, sclera non-icteric, mucus membranes are moist,   Neck: Supple. Negative for carotid bruits. JVD not elevated.  Lungs: Clear bilaterally to auscultation without wheezes, rales, or rhonchi. Breathing is unlabored.  Heart: regular rate with S1 S2. No murmurs, rubs, or gallops appreciated.  Abdomen: Soft, non-tender, non-distended with normoactive bowel sounds. No hepatomegaly. No rebound/guarding. No obvious abdominal masses.  Msk:  Strength and tone appear normal for age.  Extremities: No clubbing or cyanosis. No edema.  Distal pedal pulses are 2+    Neuro: Alert and oriented X 3. Moves all extremities spontaneously.  Psych:  Responds to questions appropriately with a normal affect.  ECG: Sep 06, 2012:  Sinus brady at 50.  No ST or T wave changes.  Assessment / Plan:

## 2012-09-06 NOTE — Patient Instructions (Addendum)
Your physician wants you to follow-up in: 6 months You will receive a reminder letter in the mail two months in advance. If you don't receive a letter, please call our office to schedule the follow-up appointment.  Your physician recommends that you return for a FASTING lipid profile: 6 months   Your physician recommends that you continue on your current medications as directed. Please refer to the Current Medication list given to you.REDUCE HIGH SODIUM FOODS LIKE CANNED SOUP, GRAVY, SAUCES, READY PREPARED FOODS LIKE FROZEN FOODS; LEAN CUISINE, LASAGNA. BACON, SAUSAGE, LUNCH MEAT, FAST FOODS..hot dogs, chips

## 2012-09-06 NOTE — Assessment & Plan Note (Signed)
His last cardiac catheterization in March 14 reveals that he is vessels were fairly well vascularized. The saphenous vein graft to his right coronary artery is occluded but the native right is normal.  Several small lesions that are probably causing his episodes of angina.  We'll continue with his same medications.    At this point he does not have any coronary lesions that are mobile to PCI.  I suspect that some of his chest pain and certainly most of shortness breath is due to his severe COPD. I will see him  in 6 months for office visit and basic metabolic profile, lipid profile, and hepatic profile.

## 2012-10-20 ENCOUNTER — Telehealth: Payer: Self-pay | Admitting: Physician Assistant

## 2012-10-20 ENCOUNTER — Other Ambulatory Visit: Payer: Self-pay | Admitting: Cardiovascular Disease

## 2012-10-20 IMAGING — CT CT ABD-PELV W/O CM
4 of 9 series · 15 of 46 positions shown, 17 images · non-contrast
Comparison: 07/24/2011

CLINICAL DATA: Left lower groin and testicular pain.  Difficulty
urinating.  Hematuria.

CT ABDOMEN AND PELVIS WITHOUT CONTRAST
TECHNIQUE: Multidetector CT imaging of the abdomen and pelvis was
performed following the standard protocol without intravenous
contrast.

[Series 2: abdomen/pelvis w/o contrast · axial · non-contrast · 0.80mm/px · z∈[-546,-91]mm · 9 of 115 slices shown, 11 images (1 of 2)]
[im 12/115  soft-tissue]
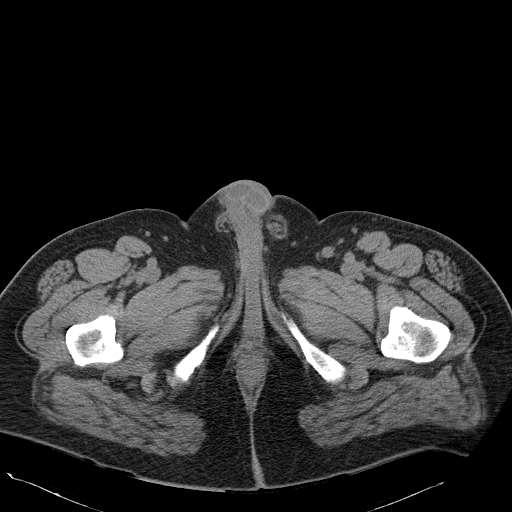
[im 12/115  bone]
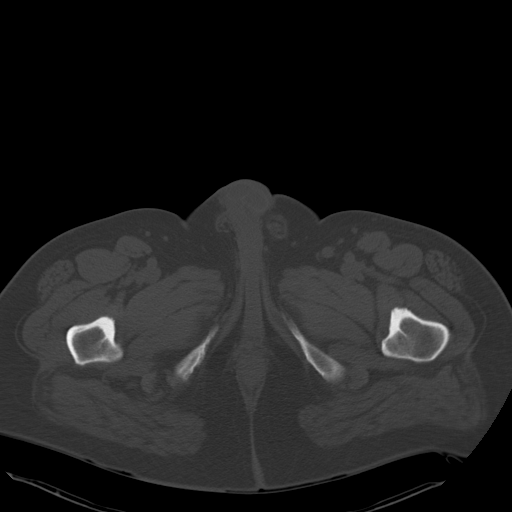
[im 23/115  soft-tissue]
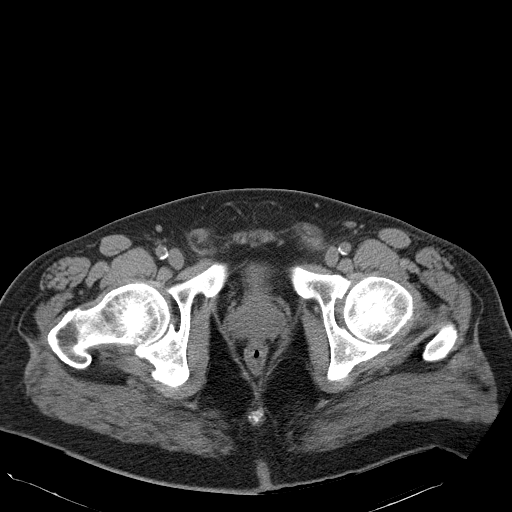
[im 35/115  soft-tissue]
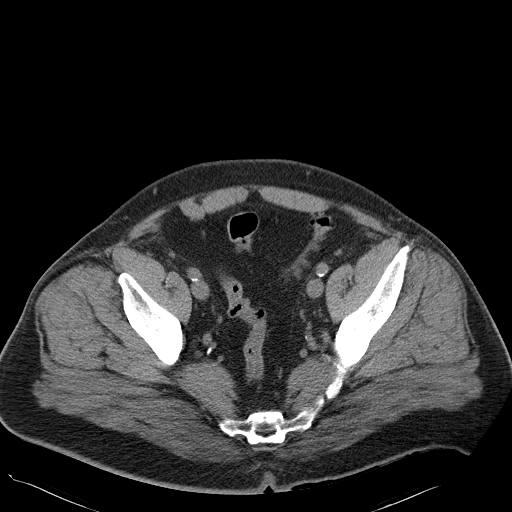
[im 46/115  soft-tissue]
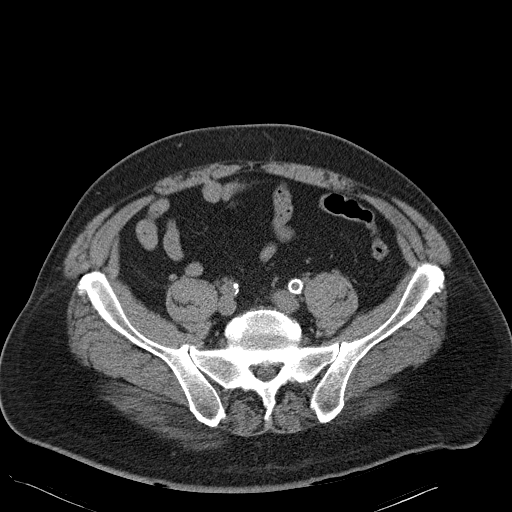
[im 58/115  soft-tissue]
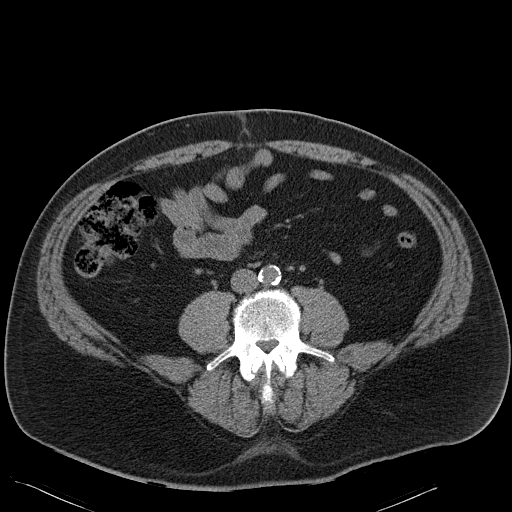
[im 69/115  soft-tissue]
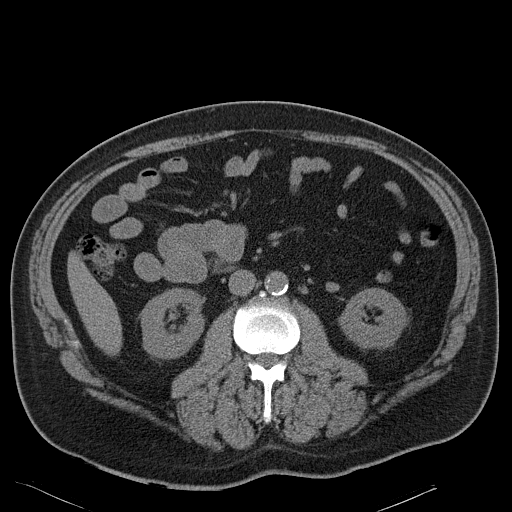
[im 80/115  soft-tissue]
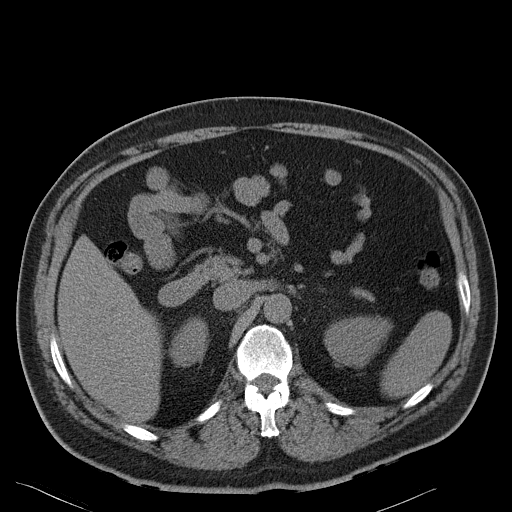
[im 92/115  soft-tissue]
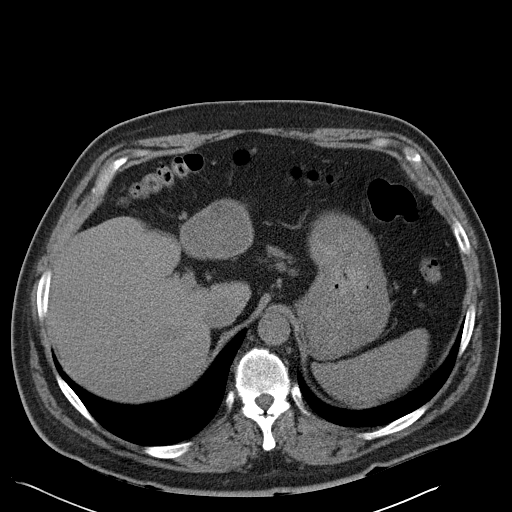
[im 103/115  soft-tissue]
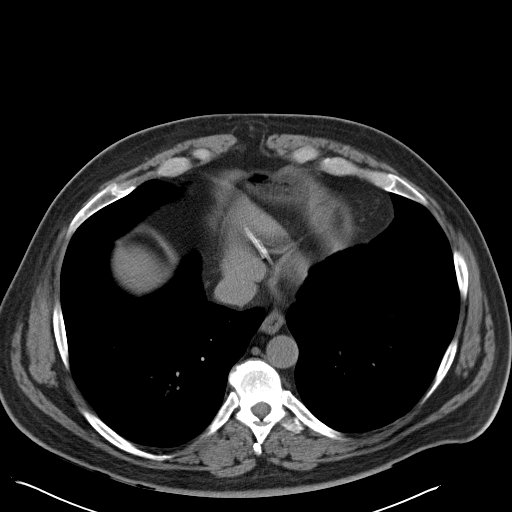
[im 103/115  bone]
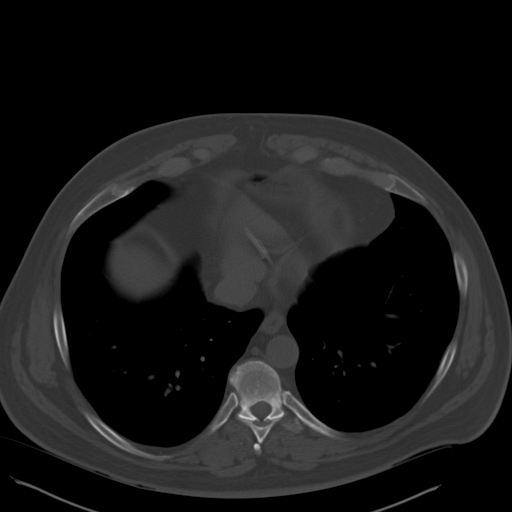

[Series 3: lung 5.0 b70f · axial · 0.80mm/px · 1 of 115 slices shown]
[im 12/115  bone]
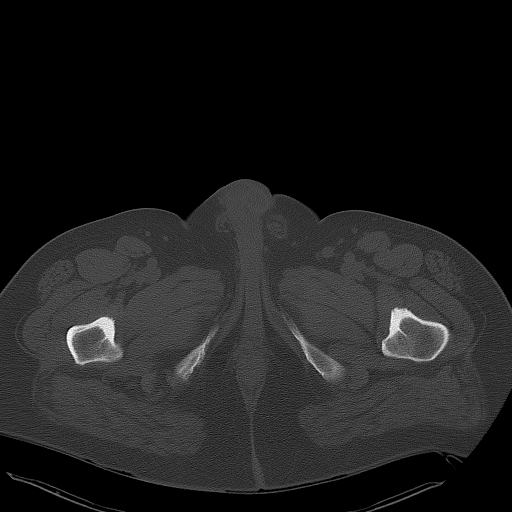

[Series 4: mpr cor (id) · coronal · 0.80mm/px · 3 of 102 slices shown]
[im 26/102  soft-tissue]
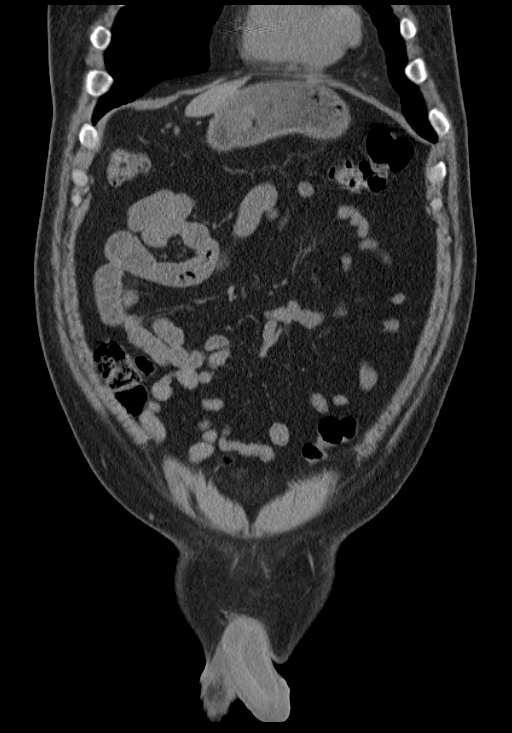
[im 51/102  soft-tissue]
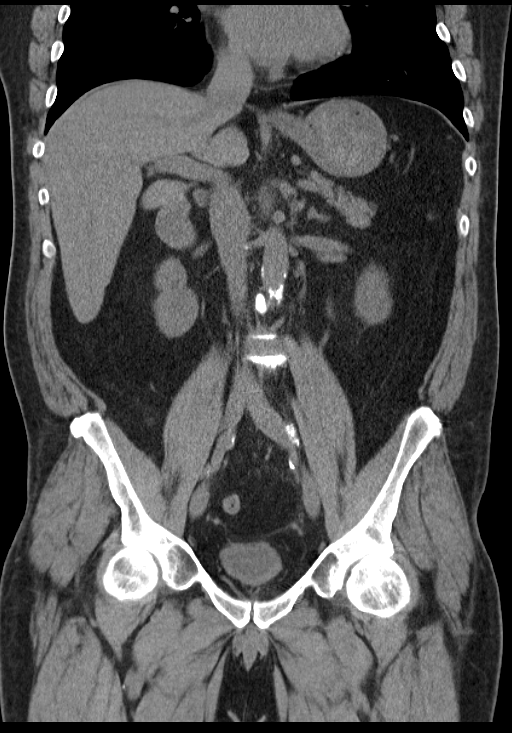
[im 76/102  soft-tissue]
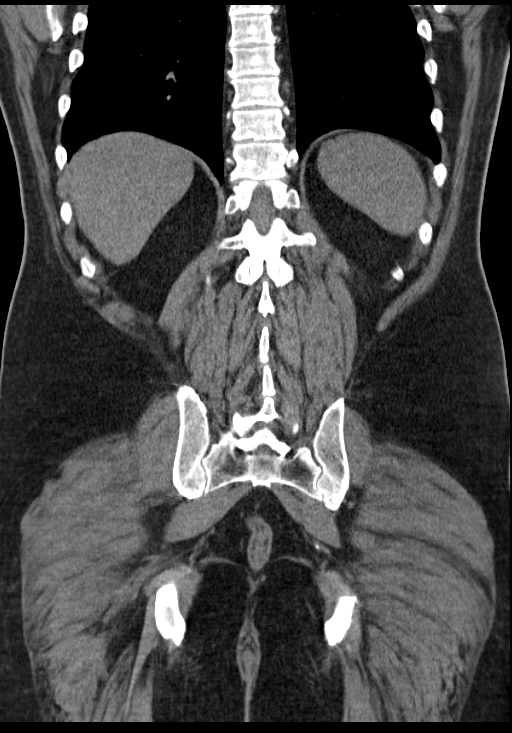

[Series 12: abdomen/pelvis w/o contrast · axial · non-contrast · 0.80mm/px · z∈[-632,-566]mm · 2 of 40 slices shown (2 of 2)]
[im 14/40  soft-tissue]
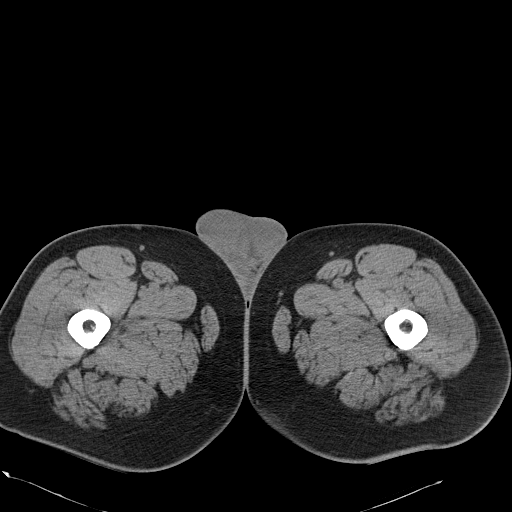
[im 27/40  soft-tissue]
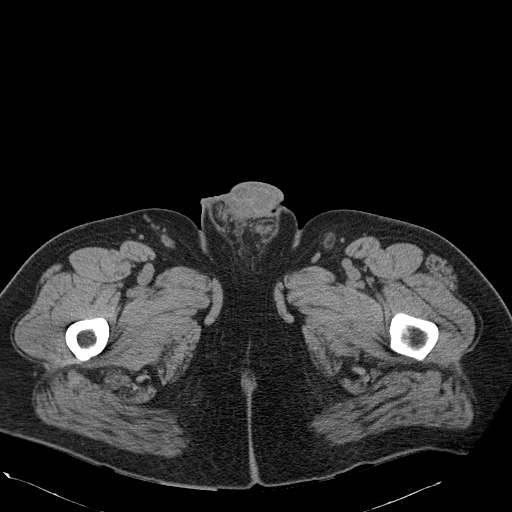

[15 of 46 positions shown; findings below may reference images not displayed]

FINDINGS: The lung bases are clear.  Postoperative changes in the
mediastinum.  Coronary artery stents.

Mild calcifications of the renal hila consistent with vascular
calcifications.  No renal, ureteral, or bladder stones are
visualized.  The bladder is decompressed and cannot be evaluated
for wall thickening.  No pyelocaliectasis or ureterectasis.

Small accessory spleen.  Contracted gallbladder.  The liver,
spleen, pancreas, adrenal glands, and retroperitoneal lymph nodes
are otherwise unremarkable.  Calcification throughout the abdominal
aorta and iliac arteries without aneurysm.  No free air or free
fluid in the abdomen.  The stomach and small bowel are
decompressed.  Stool filled colon without distension or wall
thickening.

Pelvis:  The prostate gland is not significantly enlarged.  Fluid
collections seen previously in the prostate appear to have
resolved. However, these may have been more apparent on the prior
study which had contrast administered intravenously.  The bladder
is decompressed.  No free or loculated pelvic fluid collections.
No significant pelvic lymphadenopathy.  The appendix is normal.
Degenerative changes in the lumbar spine.  Bilateral spondylolysis
at L5-S1 without spondylolisthesis.
IMPRESSION: No renal or ureteral stone or obstruction.  Prostatic fluid
collections appear to have resolved since the previous study.

## 2012-10-20 MED ORDER — HYDROCHLOROTHIAZIDE 25 MG PO TABS: 25.0000 mg | ORAL_TABLET | ORAL | Status: DC

## 2012-10-20 NOTE — Telephone Encounter (Signed)
Medication refilled per protocol. 

## 2012-10-25 ENCOUNTER — Encounter (HOSPITAL_COMMUNITY): Payer: Self-pay

## 2012-10-25 ENCOUNTER — Emergency Department (HOSPITAL_COMMUNITY)
Admission: EM | Admit: 2012-10-25 | Discharge: 2012-10-25 | Disposition: A | Discharge status: Home / Self Care | Payer: Medicaid Other | Attending: Emergency Medicine | Admitting: Emergency Medicine

## 2012-10-25 DIAGNOSIS — I739 Peripheral vascular disease, unspecified: Secondary | ICD-10-CM

## 2012-10-25 DIAGNOSIS — I498 Other specified cardiac arrhythmias: Secondary | ICD-10-CM | POA: Insufficient documentation

## 2012-10-25 DIAGNOSIS — Z7982 Long term (current) use of aspirin: Secondary | ICD-10-CM | POA: Insufficient documentation

## 2012-10-25 DIAGNOSIS — J4489 Other specified chronic obstructive pulmonary disease: Secondary | ICD-10-CM | POA: Insufficient documentation

## 2012-10-25 DIAGNOSIS — R0602 Shortness of breath: Secondary | ICD-10-CM | POA: Insufficient documentation

## 2012-10-25 DIAGNOSIS — Z7901 Long term (current) use of anticoagulants: Secondary | ICD-10-CM | POA: Insufficient documentation

## 2012-10-25 DIAGNOSIS — M79604 Pain in right leg: Secondary | ICD-10-CM

## 2012-10-25 DIAGNOSIS — M79609 Pain in unspecified limb: Secondary | ICD-10-CM

## 2012-10-25 DIAGNOSIS — I2510 Atherosclerotic heart disease of native coronary artery without angina pectoris: Secondary | ICD-10-CM | POA: Insufficient documentation

## 2012-10-25 DIAGNOSIS — M79605 Pain in left leg: Secondary | ICD-10-CM

## 2012-10-25 DIAGNOSIS — I10 Essential (primary) hypertension: Secondary | ICD-10-CM | POA: Insufficient documentation

## 2012-10-25 DIAGNOSIS — Z888 Allergy status to other drugs, medicaments and biological substances status: Secondary | ICD-10-CM | POA: Insufficient documentation

## 2012-10-25 DIAGNOSIS — I252 Old myocardial infarction: Secondary | ICD-10-CM | POA: Insufficient documentation

## 2012-10-25 DIAGNOSIS — Z87891 Personal history of nicotine dependence: Secondary | ICD-10-CM | POA: Insufficient documentation

## 2012-10-25 DIAGNOSIS — IMO0001 Reserved for inherently not codable concepts without codable children: Secondary | ICD-10-CM | POA: Insufficient documentation

## 2012-10-25 DIAGNOSIS — E785 Hyperlipidemia, unspecified: Secondary | ICD-10-CM | POA: Insufficient documentation

## 2012-10-25 DIAGNOSIS — I70229 Atherosclerosis of native arteries of extremities with rest pain, unspecified extremity: Secondary | ICD-10-CM | POA: Insufficient documentation

## 2012-10-25 DIAGNOSIS — J449 Chronic obstructive pulmonary disease, unspecified: Secondary | ICD-10-CM | POA: Insufficient documentation

## 2012-10-25 DIAGNOSIS — R252 Cramp and spasm: Secondary | ICD-10-CM | POA: Insufficient documentation

## 2012-10-25 DIAGNOSIS — R262 Difficulty in walking, not elsewhere classified: Secondary | ICD-10-CM | POA: Insufficient documentation

## 2012-10-25 DIAGNOSIS — Z79899 Other long term (current) drug therapy: Secondary | ICD-10-CM | POA: Insufficient documentation

## 2012-10-25 LAB — CBC WITH DIFFERENTIAL/PLATELET
Basophils Absolute: 0 10*3/uL (ref 0.0–0.1)
Basophils Relative: 1 % (ref 0–1)
Eosinophils Absolute: 0.1 10*3/uL (ref 0.0–0.7)
Eosinophils Relative: 2 % (ref 0–5)
HCT: 40.5 % (ref 39.0–52.0)
Hemoglobin: 13.7 g/dL (ref 13.0–17.0)
Lymphocytes Relative: 27 % (ref 12–46)
Lymphs Abs: 1.4 10*3/uL (ref 0.7–4.0)
MCH: 28.2 pg (ref 26.0–34.0)
MCHC: 33.8 g/dL (ref 30.0–36.0)
MCV: 83.5 fL (ref 78.0–100.0)
Monocytes Absolute: 0.4 10*3/uL (ref 0.1–1.0)
Monocytes Relative: 8 % (ref 3–12)
Neutro Abs: 3.3 10*3/uL (ref 1.7–7.7)
Neutrophils Relative %: 64 % (ref 43–77)
Platelets: 189 10*3/uL (ref 150–400)
RBC: 4.85 MIL/uL (ref 4.22–5.81)
RDW: 12.8 % (ref 11.5–15.5)
WBC: 5.2 10*3/uL (ref 4.0–10.5)

## 2012-10-25 LAB — BASIC METABOLIC PANEL
BUN: 11 mg/dL (ref 6–23)
CO2: 28 mEq/L (ref 19–32)
Calcium: 9.4 mg/dL (ref 8.4–10.5)
Chloride: 105 mEq/L (ref 96–112)
Creatinine, Ser: 1.15 mg/dL (ref 0.50–1.35)
GFR calc Af Amer: 84 mL/min — ABNORMAL LOW (ref >90)
GFR calc non Af Amer: 73 mL/min — ABNORMAL LOW (ref >90)
Glucose, Bld: 89 mg/dL (ref 70–99)
Potassium: 3.7 mEq/L (ref 3.5–5.1)
Sodium: 144 mEq/L (ref 135–145)

## 2012-10-25 LAB — CG4 I-STAT (LACTIC ACID): Lactic Acid, Venous: 1.34 mmol/L (ref 0.5–2.2)

## 2012-10-25 MED ORDER — TRAMADOL HCL 50 MG PO TABS
50.0000 mg | ORAL_TABLET | Freq: Once | ORAL | Status: AC
  Administered 2012-10-25: 50 mg via ORAL
  Filled 2012-10-25: qty 1

## 2012-10-25 MED ORDER — OXYCODONE-ACETAMINOPHEN 5-325 MG PO TABS
1.0000 | ORAL_TABLET | Freq: Once | ORAL | Status: AC
  Administered 2012-10-25: 1 via ORAL
  Filled 2012-10-25: qty 1

## 2012-10-25 MED ORDER — HYDROMORPHONE HCL PF 1 MG/ML IJ SOLN: 1.0000 mg | Freq: Once | INTRAMUSCULAR | Status: DC

## 2012-10-25 MED ORDER — ACETAMINOPHEN 500 MG PO TABS: 500.0000 mg | ORAL_TABLET | Freq: Four times a day (QID) | ORAL | Status: DC | PRN

## 2012-10-25 NOTE — ED Notes (Signed)
Erin, PA made aware results back for ABI.

## 2012-10-25 NOTE — ED Provider Notes (Signed)
History    CSN: [id number] Arrival date & time 10/25/12  0918  First MD Initiated Contact with Patient 10/25/12 [phone]     Chief Complaint  Patient presents with  . Leg Pain   (Consider location/radiation/quality/duration/timing/severity/associated sxs/prior Treatment) HPI Pt is a 52yo male with hx of CAD (s/p CABG 01/2009) and bilateral fem-pop bypass c/o bilateral leg pain, throbbing in nature, 8/10, worse with walking, started on Wednesday 6/25. Has not tried anything for pain.  Pt states he is seen by Dr. Elease Hashimoto but states when he originally presented with PVD, they did not see anything on ultrasound, blockages were only found during catheterization.  Pt is on plavix.  Denies any fever, redness or warmth of legs. No open sores.   Past Medical History  Diagnosis Date  . Coronary artery disease     STATUS POST CABG 01/2009  . 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 Dyslipidemia   . Hypertension   . Dyspnea   . Orthostatic hypotension   . Syncope and collapse   . Chest pain   . Myocardial infarction   . COPD (chronic obstructive pulmonary disease)   . Hyperlipidemia    Past Surgical History  Procedure Laterality Date  . Cardiac catheterization  10/19/2009  . Ankle surgery      x 2  . Sternal wires removal  07/08/2011    Procedure: STERNAL WIRES REMOVAL;  Surgeon: [name], MD;  [facility]: [facility] OR;  Service: Open Heart Surgery;  Laterality: N/A;  removal of 4th wire  . Pr vein bypass graft,aorto-fem-pop    . Coronary artery bypass graft  2010  . Colonoscopy  April 2013  . Foot surgery    . Kidney stimulator    . Hernia repair  01/23/12    umb hernia   Family History  Problem Relation Age of Onset  . Coronary artery disease Father   . Diabetes Father   . Hyperlipidemia Father   . Heart attack Father   . Cancer Mother     uterine  . Hyperlipidemia Mother   . Hypertension Brother    History  Substance Use Topics  . Smoking status: Former Smoker -- 3.00 packs/day  for 30 years    Types: Cigarettes, Cigars    Quit date: 01/26/2009  . Smokeless tobacco: Never Used  . Alcohol Use: No    Review of Systems  Constitutional: Negative for fever, chills, diaphoresis and fatigue.  Respiratory: Positive for shortness of breath. Negative for chest tightness.   Cardiovascular: Negative for chest pain.  Musculoskeletal: Positive for myalgias, arthralgias and gait problem ( pain with walking). Negative for back pain and joint swelling.  Skin: Negative for color change, pallor and rash.  All other systems reviewed and are negative.    Allergies  Crestor  Home Medications   Current Outpatient Rx  Name  Route  Sig  Dispense  Refill  . ADVAIR DISKUS 250-50 MCG/DOSE AEPB   Oral   Take 2 puffs by mouth daily as needed (for trouble breathing).          Marland Kitchen aspirin EC 81 MG tablet   Oral   Take 81 mg by mouth every morning.         Marland Kitchen atorvastatin (LIPITOR) 40 MG tablet   Oral   Take 1 tablet (40 mg total) by mouth daily.   90 tablet   1   . clopidogrel (PLAVIX) 75 MG tablet   Oral   Take 1 tablet (75  mg total) by mouth every morning.   30 tablet   11   . hydrochlorothiazide (HYDRODIURIL) 25 MG tablet   Oral   Take 1 tablet (25 mg total) by mouth every morning.   30 tablet   5   . losartan (COZAAR) 100 MG tablet      TAKE 1 TABLET BY MOUTH EVERY MORNING   30 tablet   0     PT NEEDS TO MAKE AN APPOINTMENT FOR FURTHER REFILL .Marland Kitchen.   . potassium chloride (K-DUR) 10 MEQ tablet   Oral   Take 1 tablet (10 mEq total) by mouth every morning.   30 tablet   5   . tiotropium (SPIRIVA) 18 MCG inhalation capsule   Inhalation   Place 18 mcg into inhaler and inhale daily.          Marland Kitchen acetaminophen (TYLENOL) 500 MG tablet   Oral   Take 1 tablet (500 mg total) by mouth every 6 (six) hours as needed for pain.   30 tablet   0    BP 123/77  Pulse 49  Temp(Src) 97.9 F (36.6 C) (Oral)  Resp 12  SpO2 98% Physical Exam  Nursing note and  vitals reviewed. Constitutional: He appears well-developed and well-nourished. No distress.  Pt lying in exam bed, moving shuffling his feet under the sheets.  NAD.  HENT:  Head: Normocephalic and atraumatic.  Eyes: Conjunctivae are normal. No scleral icterus.  Neck: Normal range of motion.  Cardiovascular: Regular rhythm, normal heart sounds and intact distal pulses.  Bradycardia present.   Pulmonary/Chest: Effort normal and breath sounds normal. No respiratory distress. He has no wheezes. He has no rales. He exhibits no tenderness.  Abdominal: Soft. Bowel sounds are normal. He exhibits no distension and no mass. There is no tenderness. There is no rebound and no guarding.  Abd soft, NDNT, no audible aortic bruit, no femoral bruit  Musculoskeletal: Normal range of motion. He exhibits tenderness ( bilateral thighs).  Neurological: He is alert.  Skin: Skin is warm and dry. He is not diaphoretic.    ED Course  Procedures (including critical care time) Labs Reviewed  BASIC METABOLIC PANEL - Abnormal; Notable for the following:    GFR calc non Af Amer 73 (*)    GFR calc Af Amer 84 (*)    All other components within normal limits  CBC WITH DIFFERENTIAL  CG4 I-STAT (LACTIC ACID)   No results found.   Date: 10/25/2012  Rate: 52  Rhythm: sinus bradycardia  QRS Axis: normal  Intervals: normal  ST/T Wave abnormalities: normal  Conduction Disutrbances:none  Narrative Interpretation:   Old EKG Reviewed: unchanged   1. Leg cramps   2. Pain in limb   3. Bilateral leg pain   4. Claudication in peripheral vascular disease     MDM  Concern for blockage of fem-pop bypasses in both legs.  Concern for clot or atherosclerosis.  Distal pulses intact.  Discussed pt with Dr. Rhunette Croft who ordered CBC, BMP, and lactic acid.  If labs negative, will have pt f/u with PCP and Dr. Ala Dach.  Tx in ED: tramadol and percocet for leg pain.   CBC: nl BMP: unremarkable, consistent with pt baseline Lactic  acid: nl EKG: NSR, no STEMI  ABI: nl   Pt states stents were placed by Dr. Darrick Penna and needs referral.  Rx: acetaminophen. Will discharge pt home and have him f/u with Dr. Darrick Penna, Dr. Jerl Mina, and Dr. Tanya Nones as needed for ongoing leg  pain.  May need to schedule catheterization or other imaging to assess stents. Info provided. Return precautions given. Pt verbalized understanding and agreement with tx plan. Vitals: unremarkable. Discharged in stable condition.    Discussed pt with attending during ED encounter.     Junius Finner, PA-C 10/25/12 1505

## 2012-10-25 NOTE — ED Notes (Signed)
Pt returned from vascular

## 2012-10-25 NOTE — Progress Notes (Signed)
VASCULAR LAB PRELIMINARY  PRELIMINARY  PRELIMINARY  PRELIMINARY     ARTERIAL  ABI completed:  ABI pressures are within normal limits.  However, the dorsalis pedis waveforms are abnormal.    RIGHT    LEFT    PRESSURE WAVEFORM  PRESSURE WAVEFORM  BRACHIAL 108 B BRACHIAL 106 B  DP 127 DM DP 136 DM  AT   AT    PT 154 B PT 163 B  PER   PER    GREAT TOE  NA GREAT TOE  NA    RIGHT LEFT  ABI 1.43 1.51     Aemilia Dedrick, RVT 10/25/2012, 2:11 PM   Agostino Gorin, RVT 10/25/2012, 2:11 PM

## 2012-10-25 NOTE — ED Notes (Signed)
Pt c/o throbbing pain down both legs since Wednesday. Has hx of fem-pop bypass a few years ago. Pedal pulses present. Pt states it hurts to walk and that is how it felt before he had the fem-pop. The difference is when he would sit down before the pain would go away and now it doesn't.

## 2012-10-25 NOTE — ED Notes (Signed)
New and old EKG given to Dr. Nanavati. Copies placed in pt chart. 

## 2012-10-25 NOTE — ED Notes (Signed)
Report given to [place] on Pod C. Pt awaiting duplex study at this time. NAD at this time. VSS

## 2012-10-25 NOTE — ED Notes (Signed)
Results of lactic acid called to nurse

## 2012-10-26 ENCOUNTER — Telehealth: Payer: Self-pay | Admitting: *Deleted

## 2012-10-26 NOTE — Telephone Encounter (Signed)
Patient called in to report that he has had increased cramping in his legs; he was seen yesterday in the ED and had an ABI. He is requesting a sooner appt than 11-18-12 with Dr. Darrick Penna, and I told him that we would put him on a wait list for any cancellation prior to that date. I explained to him that Dr. Darrick Penna was not in the office this week and was only in the office on Thursdays. I will have him placed on our waitlist and he will be notified if Dr. Darrick Penna adds any extra office time. Mr. Mulkern is in agreement of this plan.

## 2012-10-26 NOTE — ED Provider Notes (Signed)
Medical screening examination/treatment/procedure(s) were conducted as a shared visit with non-physician practitioner(s) and myself.  I personally evaluated the patient during the encounter.  No signs of acute limb threatning condition. ABI ordered. Pt has vascular pathology, so if ABI are WNL, patient will get vascular f/u for claudication like sx.  Derwood Kaplan, MD 10/26/12 2118

## 2012-10-28 ENCOUNTER — Encounter: Payer: Self-pay | Admitting: Vascular Surgery

## 2012-10-28 ENCOUNTER — Other Ambulatory Visit: Payer: Self-pay

## 2012-10-28 DIAGNOSIS — M79609 Pain in unspecified limb: Secondary | ICD-10-CM

## 2012-10-28 DIAGNOSIS — I739 Peripheral vascular disease, unspecified: Secondary | ICD-10-CM

## 2012-11-04 ENCOUNTER — Encounter: Payer: Self-pay | Admitting: Family Medicine

## 2012-11-04 ENCOUNTER — Ambulatory Visit (INDEPENDENT_AMBULATORY_CARE_PROVIDER_SITE_OTHER): Payer: Medicaid Other | Admitting: Family Medicine

## 2012-11-04 VITALS — BP 126/80 | HR 60 | Temp 98.2°F | Resp 16 | Ht 72.0 in | Wt 245.0 lb

## 2012-11-04 DIAGNOSIS — E049 Nontoxic goiter, unspecified: Secondary | ICD-10-CM

## 2012-11-04 DIAGNOSIS — I10 Essential (primary) hypertension: Secondary | ICD-10-CM

## 2012-11-04 DIAGNOSIS — J441 Chronic obstructive pulmonary disease with (acute) exacerbation: Secondary | ICD-10-CM

## 2012-11-04 DIAGNOSIS — M791 Myalgia, unspecified site: Secondary | ICD-10-CM

## 2012-11-04 DIAGNOSIS — I739 Peripheral vascular disease, unspecified: Secondary | ICD-10-CM

## 2012-11-04 DIAGNOSIS — IMO0001 Reserved for inherently not codable concepts without codable children: Secondary | ICD-10-CM

## 2012-11-04 DIAGNOSIS — I2510 Atherosclerotic heart disease of native coronary artery without angina pectoris: Secondary | ICD-10-CM

## 2012-11-04 DIAGNOSIS — I7090 Unspecified atherosclerosis: Secondary | ICD-10-CM

## 2012-11-04 LAB — LIPID PANEL
Cholesterol: 153 mg/dL (ref 0–200)
HDL: 37 mg/dL — ABNORMAL LOW (ref >39)
LDL Cholesterol: 85 mg/dL (ref 0–99)
Total CHOL/HDL Ratio: 4.1 Ratio
Triglycerides: 155 mg/dL — ABNORMAL HIGH (ref <150)
VLDL: 31 mg/dL (ref 0–40)

## 2012-11-04 LAB — CK: Total CK: 91 U/L (ref 7–232)

## 2012-11-04 LAB — TSH: TSH: 1.204 u[IU]/mL (ref 0.350–4.500)

## 2012-11-04 NOTE — Addendum Note (Signed)
Addended by: WRAY, [place] on: 11/04/2012 11:18 AM   Modules accepted: Orders

## 2012-11-04 NOTE — Progress Notes (Signed)
Subjective:    Patient ID: [name], male    DOB: 1961-01-16, 52 y.o.   MRN: [id number]  HPI  Patient was recently seen in the hospital (6/30) for cramp-like pain in both legs from his hips to his feet.  ABIs were obtained in the hospital which were within normal limits.  He has a significant past medical history of atherosclerosis including bilateral femoropopliteal bypasses.  The emergency room labs were reviewed today. He was discharged home to follow up with me.  He describes the pain as an intense ache. It actually improves with ambulation.  He also reports worsening weakness in both legs. He denies dysesthesias or paresthesias. He does complain of severe lower back pain. He is also having fecal incontinence.  The patient independently quit Lipitor and the pain subsided.  He has recently resumed his Lipitor and is asymptomatic at present however he continues to have a difficult time walking due to weakness in his legs.  He is scheduled to see his vascular doctor later this month.  Assessment history of coronary artery disease, hypertension, hyperlipidemia and COPD. He denies chest pain shortness of breath or dyspnea on exertion.  He is no longer smoking. His blood pressure is well controlled. He is due to recheck his lipids today. Past Medical History  Diagnosis Date  . Coronary artery disease     STATUS POST CABG 01/2009  . 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 Dyslipidemia   . Hypertension   . Dyspnea   . Orthostatic hypotension   . Syncope and collapse   . Chest pain   . Myocardial infarction   . COPD (chronic obstructive pulmonary disease)   . Hyperlipidemia    Current Outpatient Prescriptions on File Prior to Visit  Medication Sig Dispense Refill  . acetaminophen (TYLENOL) 500 MG tablet Take 1 tablet (500 mg total) by mouth every 6 (six) hours as needed for pain.  30 tablet  0  . ADVAIR DISKUS 250-50 MCG/DOSE AEPB Take 2 puffs by mouth daily as needed (for trouble breathing).        Marland Kitchen aspirin EC 81 MG tablet Take 81 mg by mouth every morning.      Marland Kitchen atorvastatin (LIPITOR) 40 MG tablet Take 1 tablet (40 mg total) by mouth daily.  90 tablet  1  . clopidogrel (PLAVIX) 75 MG tablet Take 1 tablet (75 mg total) by mouth every morning.  30 tablet  11  . hydrochlorothiazide (HYDRODIURIL) 25 MG tablet Take 1 tablet (25 mg total) by mouth every morning.  30 tablet  5  . losartan (COZAAR) 100 MG tablet TAKE 1 TABLET BY MOUTH EVERY MORNING  30 tablet  0  . potassium chloride (K-DUR) 10 MEQ tablet Take 1 tablet (10 mEq total) by mouth every morning.  30 tablet  5  . tiotropium (SPIRIVA) 18 MCG inhalation capsule Place 18 mcg into inhaler and inhale daily.        No current facility-administered medications on file prior to visit.   Past Surgical History  Procedure Laterality Date  . Cardiac catheterization  10/19/2009  . Ankle surgery      x 2  . Sternal wires removal  07/08/2011    Procedure: STERNAL WIRES REMOVAL;  Surgeon: [name], MD;  Location: [facility] OR;  Service: Open Heart Surgery;  Laterality: N/A;  removal of 4th wire  . Pr vein bypass graft,aorto-fem-pop    . Coronary artery bypass graft  2010  . Colonoscopy  April 2013  .  Foot surgery    . Kidney stimulator    . Hernia repair  01/23/12    umb hernia   History   Social History  . Marital Status: Married    Spouse Name: N/A    Number of Children: N/A  . Years of Education: N/A   Occupational History  . Not on file.   Social History Main Topics  . Smoking status: Former Smoker -- 3.00 packs/day for 30 years    Types: Cigarettes, Cigars    Quit date: 01/26/2009  . Smokeless tobacco: Never Used  . Alcohol Use: No  . Drug Use: No  . Sexually Active: Not Currently   Other Topics Concern  . Not on file   Social History Narrative  . No narrative on file   Family History  Problem Relation Age of Onset  . Coronary artery disease Father   . Diabetes Father   . Hyperlipidemia Father   . Heart  attack Father   . Cancer Mother     uterine  . Hyperlipidemia Mother   . Hypertension Brother      Review of Systems  All other systems reviewed and are negative.       Objective:   Physical Exam  Vitals reviewed. Constitutional: He is oriented to person, place, and time. He appears well-developed and well-nourished.  HENT:  Right Ear: External ear normal.  Left Ear: External ear normal.  Nose: Nose normal.  Mouth/Throat: Oropharynx is clear and moist. No oropharyngeal exudate.  Eyes: Conjunctivae are normal. Pupils are equal, round, and reactive to light.  Neck: Neck supple. No JVD present. Thyromegaly present.  Cardiovascular: Normal rate, regular rhythm and normal heart sounds.   No murmur heard. Pulmonary/Chest: Effort normal and breath sounds normal. No respiratory distress. He has no wheezes. He has no rales.  Abdominal: Soft. Bowel sounds are normal. He exhibits no distension. There is no tenderness. There is no rebound.  Lymphadenopathy:    He has no cervical adenopathy.  Neurological: He is oriented to person, place, and time. He has normal reflexes. He displays normal reflexes. He exhibits normal muscle tone. Coordination normal.  Skin: Skin is warm. No erythema. No pallor.   he has palpable dorsalis pedis and posterior tibialis pulses in his right foot. I do not appreciate a pulse in his left foot although the skin is warm with normal capillary refill.        Assessment & Plan:  1. ASCVD (arteriosclerotic cardiovascular disease) Next fasting lipid panels today. His goal LDL is less than 70 given his significant medical history. - Lipid panel  2. Myalgia Check a CK level.  If elevated would indicate statin-induced myopathy.   - CK - US Soft Tissue Head/Neck; Future  3. Claudication of lower extremity The patient will see his vascular doctor to completely rule out vascular claudication. However he has palpable pulses and a normal ABI in the hospital.   Therefore I am about spinal stenosis and neurogenic claudication. I will obtain a CT scan of his lumbar spine. I cannot obtain an MRI due to a spinal stimulator that was placed at [facility]. - CT Lumbar Spine W Contrast; Future  4. HTN (hypertension) Well controlled continue current medications at present dosages  5. COPD exacerbation Currently controlled continue Advair and spiriva  6. Goiter Patient has a noticeable odor on exam without palpable nodules. I will obtain an ultrasound of the thyroid to rule out underlying nodularity. Also check TSH - US Soft Tissue Head/Neck;  Future

## 2012-11-08 ENCOUNTER — Ambulatory Visit
Admission: RE | Admit: 2012-11-08 | Discharge: 2012-11-08 | Disposition: A | Discharge status: Home / Self Care | Payer: Medicaid Other | Source: Ambulatory Visit | Attending: Family Medicine | Admitting: Family Medicine

## 2012-11-08 DIAGNOSIS — E049 Nontoxic goiter, unspecified: Secondary | ICD-10-CM

## 2012-11-08 DIAGNOSIS — M791 Myalgia, unspecified site: Secondary | ICD-10-CM

## 2012-11-09 ENCOUNTER — Telehealth: Payer: Self-pay | Admitting: Family Medicine

## 2012-11-09 NOTE — Telephone Encounter (Signed)
I am looking for neurogenic claudication and would need to do myelogram

## 2012-11-09 NOTE — Telephone Encounter (Signed)
Patient scheduled for CT Lumbar Spine with contrast tomorrow at 1240 PM.  Imaging want to know why contrast??  If looking for stenosis do not need contrast.  If looking for neurogenic claudication then Myelogram is recommended.  Please advise??  Will need to change order and have redo pre certification.

## 2012-11-10 ENCOUNTER — Inpatient Hospital Stay: Admission: RE | Admit: 2012-11-10 | Payer: Self-pay | Source: Ambulatory Visit

## 2012-11-10 NOTE — Telephone Encounter (Signed)
I need to order myelogram but imaging center needs approval from you.  Patient needs to be off blood thinners for 5 days prior to doing this procedure.  Will that be OK. Tried to call patient to not go to CT scan but no answer, left mess.

## 2012-11-11 NOTE — Telephone Encounter (Signed)
Pt aware Myelogram on hold until after seeing Vascular Surgery.  Dr Tanya Nones does not want to stop blood thinners until seen there.  Appt 11/18/2012

## 2012-11-11 NOTE — Telephone Encounter (Signed)
I do not want patient off blood thinners until after he has seen his vascular surgeon.  Tell him to cancel the myelogram for now.

## 2012-11-17 ENCOUNTER — Encounter: Payer: Self-pay | Admitting: Vascular Surgery

## 2012-11-18 ENCOUNTER — Other Ambulatory Visit: Payer: Medicaid Other

## 2012-11-18 ENCOUNTER — Other Ambulatory Visit: Payer: Self-pay | Admitting: Family Medicine

## 2012-11-18 ENCOUNTER — Encounter: Payer: Self-pay | Admitting: Vascular Surgery

## 2012-11-18 ENCOUNTER — Ambulatory Visit (INDEPENDENT_AMBULATORY_CARE_PROVIDER_SITE_OTHER): Payer: Medicaid Other | Admitting: Vascular Surgery

## 2012-11-18 VITALS — BP 117/76 | HR 56 | Ht 72.0 in | Wt 245.0 lb

## 2012-11-18 DIAGNOSIS — M48061 Spinal stenosis, lumbar region without neurogenic claudication: Secondary | ICD-10-CM

## 2012-11-18 DIAGNOSIS — I70219 Atherosclerosis of native arteries of extremities with intermittent claudication, unspecified extremity: Secondary | ICD-10-CM

## 2012-11-18 NOTE — Progress Notes (Signed)
Patient is a 52 year old male who has previously undergone bilateral common iliac stenting in July of 2011. Over the past year he has noticed increasing pain in the anterior and posterior portion of each calf when walking. He was seen in June of 2013 with similar symptoms. At that time we recommended a spine MRI. However, this was denied by his insurance company. This occurs now about a half a block. It is intermittent however, sometimes longer distances sometimes short distances. He does have occasional numbness and tingling in the feet. He has chronic back pain. He previously had a spine stimulator placed for fecal incontinence at [facility]. He develops fatigue in his legs with walking. However he can get fatigue and pain in his legs while sitting still as well. Other chronic medical problems include coronary artery disease, hyperlipidemia, hypertension. These are all currently stable and followed by Dr.Pickard.  He is on Crestor. However he has been on this for quite some time without problems.  Review of systems: He he continues to have problems with fecal incontinence and really had no improvement from the stimulator. He denies any chest pain. He denies any shortness of breath.  Physical exam:  Filed Vitals:   11/18/12 0857  BP: 117/76  Pulse: 56  Height: 6' (1.829 m)  Weight: 245 lb (111.131 kg)  SpO2: 100%  Chest clear to auscultation bilaterally  Cardiac: Regular rate and rhythm without murmur  Neuro: Upper extremity and lower extremity motor strength are 5/5  Extremities: 2+ brachial radial femoral dorsalis pedis pulses bilaterally  Skin: No ulcerations  Data: The patient had bilateral ABIs performed at Hospital on 10/25/2012. These were greater than 1 bilaterally with biphasic waveforms and were unchanged from June of 2012.  Assessment: Bilateral lower extremity pain not of arterial occlusive etiology possibly related to pseudo-claudication from spinal stenosis  Plan:  The patient will followup with me in one year with repeat ABIs. Dr. Tanya Nones has a plan in place for further evaluation of his back but was waiting until after his vascular evaluation. I discussed with the patient proceeding with further evaluation of his back at this point.  Fabienne Bruns, MD [facility] Office: [phone] Pager: [phone]

## 2012-11-19 ENCOUNTER — Other Ambulatory Visit: Payer: Self-pay | Admitting: Cardiovascular Disease

## 2012-11-20 ENCOUNTER — Telehealth: Payer: Self-pay | Admitting: Family Medicine

## 2012-11-20 MED ORDER — ROFLUMILAST 500 MCG PO TABS: 500.0000 ug | ORAL_TABLET | Freq: Every day | ORAL | Status: DC

## 2012-11-20 NOTE — Telephone Encounter (Signed)
Rx Refilled  

## 2012-11-22 ENCOUNTER — Telehealth: Payer: Self-pay | Admitting: Family Medicine

## 2012-11-24 ENCOUNTER — Other Ambulatory Visit: Payer: Self-pay | Admitting: Family Medicine

## 2012-11-24 DIAGNOSIS — M549 Dorsalgia, unspecified: Secondary | ICD-10-CM

## 2012-11-24 NOTE — Telephone Encounter (Signed)
LMOVM with results.

## 2012-12-06 ENCOUNTER — Telehealth: Payer: Self-pay | Admitting: Cardiovascular Disease

## 2012-12-06 NOTE — Telephone Encounter (Signed)
may hold plavix x 5 days, taken to MR to be faxed/ plus called and left msg for Danielle.

## 2012-12-06 NOTE — Telephone Encounter (Signed)
New problem   Danielle/[facility] need response back from fax she send 11/25/12 for pt to stop his Plavix for 5 days. Please fax to [phone].

## 2012-12-15 ENCOUNTER — Other Ambulatory Visit: Payer: Self-pay

## 2012-12-15 ENCOUNTER — Inpatient Hospital Stay: Admission: RE | Admit: 2012-12-15 | Payer: Self-pay | Source: Ambulatory Visit

## 2012-12-20 ENCOUNTER — Other Ambulatory Visit: Payer: Self-pay | Admitting: Family Medicine

## 2013-01-10 ENCOUNTER — Other Ambulatory Visit: Payer: Self-pay

## 2013-01-13 ENCOUNTER — Ambulatory Visit (INDEPENDENT_AMBULATORY_CARE_PROVIDER_SITE_OTHER): Payer: Medicaid Other | Admitting: *Deleted

## 2013-01-13 VITALS — Temp 97.8°F

## 2013-01-13 DIAGNOSIS — Z23 Encounter for immunization: Secondary | ICD-10-CM

## 2013-01-20 ENCOUNTER — Telehealth: Payer: Self-pay | Admitting: Family Medicine

## 2013-01-20 MED ORDER — TIOTROPIUM BROMIDE MONOHYDRATE 18 MCG IN CAPS: 18.0000 ug | ORAL_CAPSULE | Freq: Every day | RESPIRATORY_TRACT | Status: DC

## 2013-01-20 NOTE — Telephone Encounter (Signed)
Rx Refilled  

## 2013-01-20 NOTE — Telephone Encounter (Signed)
Spiriva 18 mcg caps 30s & handihaler inhale contenct of one cap QD using handihaler #30

## 2013-02-17 ENCOUNTER — Other Ambulatory Visit: Payer: Self-pay | Admitting: Cardiovascular Disease

## 2013-03-29 ENCOUNTER — Other Ambulatory Visit: Payer: Self-pay | Admitting: Cardiovascular Disease

## 2013-04-12 ENCOUNTER — Other Ambulatory Visit: Payer: Self-pay

## 2013-04-12 MED ORDER — LOSARTAN POTASSIUM 100 MG PO TABS: ORAL_TABLET | ORAL | Status: DC

## 2013-04-14 ENCOUNTER — Ambulatory Visit (INDEPENDENT_AMBULATORY_CARE_PROVIDER_SITE_OTHER): Payer: Medicaid Other | Admitting: Family Medicine

## 2013-04-14 ENCOUNTER — Encounter: Payer: Self-pay | Admitting: Family Medicine

## 2013-04-14 VITALS — BP 110/74 | HR 84 | Temp 97.1°F | Resp 20 | Ht 69.0 in | Wt 244.0 lb

## 2013-04-14 DIAGNOSIS — E785 Hyperlipidemia, unspecified: Secondary | ICD-10-CM

## 2013-04-14 NOTE — Progress Notes (Signed)
Subjective:    Patient ID: [name], male    DOB: May 04, 1960, 52 y.o.   MRN: [id number]  HPI  Patient is a very pleasant 52 year old white male who has a history of coronary artery disease status post myocardial infarction as well as peripheral vascular disease. He is currently taking aspirin and Plavix. Her last office visit he is complaining of low back pain and pain in both legs. The patient never went for the CT of the lumbar spine due to technical difficulties but he did stop Crestor. After stopping Crestor or the pain in his lower back and his legs completely stopped. He has been doing fine ever since. He is currently taking Lipitor 80 mg by mouth daily. He denies any myalgias right upper quadrant pain. His blood pressures well controlled. He denies any chest pain or shortness of breath. He is able to check a fasting lipid panel. Past Medical History  Diagnosis Date  . Coronary artery disease     STATUS POST CABG 01/2009  . 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 Dyslipidemia   . Hypertension   . Dyspnea   . Orthostatic hypotension   . Syncope and collapse   . Chest pain   . Myocardial infarction   . COPD (chronic obstructive pulmonary disease)   . Hyperlipidemia    Current Outpatient Prescriptions on File Prior to Visit  Medication Sig Dispense Refill  . ADVAIR DISKUS 250-50 MCG/DOSE AEPB Take 2 puffs by mouth daily as needed (for trouble breathing).       Marland Kitchen aspirin EC 81 MG tablet Take 81 mg by mouth every morning.      Marland Kitchen atorvastatin (LIPITOR) 40 MG tablet Take 1 tablet (40 mg total) by mouth daily.  90 tablet  1  . clopidogrel (PLAVIX) 75 MG tablet Take 1 tablet (75 mg total) by mouth every morning.  30 tablet  11  . hydrochlorothiazide (HYDRODIURIL) 25 MG tablet Take 1 tablet (25 mg total) by mouth every morning.  30 tablet  5  . losartan (COZAAR) 100 MG tablet TAKE 1 TABLET BY MOUTH EVERY MORNING  30 tablet  5  . potassium chloride (K-DUR,KLOR-CON) 10 MEQ tablet TAKE 1  TABLET BY MOUTH EVERY MORNING  30 tablet  0  . roflumilast (DALIRESP) 500 MCG TABS tablet Take 1 tablet (500 mcg total) by mouth daily.  30 tablet  3  . tiotropium (SPIRIVA) 18 MCG inhalation capsule Place 1 capsule (18 mcg total) into inhaler and inhale daily.  30 capsule  5  . acetaminophen (TYLENOL) 500 MG tablet Take 1 tablet (500 mg total) by mouth every 6 (six) hours as needed for pain.  30 tablet  0   No current facility-administered medications on file prior to visit.   Allergies  Allergen Reactions  . Crestor [Rosuvastatin]     Muscle aches.   History   Social History  . Marital Status: Married    Spouse Name: N/A    Number of Children: N/A  . Years of Education: N/A   Occupational History  . Not on file.   Social History Main Topics  . Smoking status: Former Smoker -- 3.00 packs/day for 30 years    Types: Cigarettes, Cigars    Quit date: 01/26/2009  . Smokeless tobacco: Never Used  . Alcohol Use: No  . Drug Use: No  . Sexual Activity: Not Currently   Other Topics Concern  . Not on file   Social History Narrative  . No  narrative on file     Review of Systems  All other systems reviewed and are negative.       Objective:   Physical Exam  Vitals reviewed. Constitutional: He appears well-developed and well-nourished.  Neck: Neck supple. No JVD present.  Cardiovascular: Normal rate, regular rhythm, normal heart sounds and intact distal pulses.  Exam reveals no gallop and no friction rub.   No murmur heard. Pulmonary/Chest: Effort normal and breath sounds normal. No respiratory distress. He has no wheezes. He has no rales.  Abdominal: Soft. Bowel sounds are normal. He exhibits no distension. There is no tenderness. There is no rebound.          Assessment & Plan:  1. HLD (hyperlipidemia) Patient's goal LDL is less than 70. Return fasting in the morning for a fasting lipid panel. If his LDL is less than 70, continue Lipitor 80 mg by mouth daily. If  his LDL is greater than 70 I would add zetia. - COMPLETE METABOLIC PANEL WITH GFR; Future - Lipid panel; Future

## 2013-04-15 ENCOUNTER — Other Ambulatory Visit: Payer: Medicaid Other

## 2013-04-15 DIAGNOSIS — E785 Hyperlipidemia, unspecified: Secondary | ICD-10-CM

## 2013-04-15 LAB — LIPID PANEL
Cholesterol: 175 mg/dL (ref 0–200)
HDL: 36 mg/dL — ABNORMAL LOW (ref >39)
LDL Cholesterol: 122 mg/dL — ABNORMAL HIGH (ref 0–99)
Total CHOL/HDL Ratio: 4.9 Ratio
Triglycerides: 86 mg/dL (ref <150)
VLDL: 17 mg/dL (ref 0–40)

## 2013-04-15 LAB — COMPLETE METABOLIC PANEL WITH GFR
ALT: 24 U/L (ref 0–53)
AST: 22 U/L (ref 0–37)
Albumin: 4.3 g/dL (ref 3.5–5.2)
Alkaline Phosphatase: 93 U/L (ref 39–117)
BUN: 11 mg/dL (ref 6–23)
CO2: 27 mEq/L (ref 19–32)
Calcium: 9.1 mg/dL (ref 8.4–10.5)
Chloride: 105 mEq/L (ref 96–112)
Creat: 1.16 mg/dL (ref 0.50–1.35)
GFR, Est African American: 84 mL/min
GFR, Est Non African American: 73 mL/min
Glucose, Bld: 108 mg/dL — ABNORMAL HIGH (ref 70–99)
Potassium: 3.9 mEq/L (ref 3.5–5.3)
Sodium: 141 mEq/L (ref 135–145)
Total Bilirubin: 0.7 mg/dL (ref 0.3–1.2)
Total Protein: 6.9 g/dL (ref 6.0–8.3)

## 2013-05-02 ENCOUNTER — Other Ambulatory Visit: Payer: Self-pay | Admitting: Physician Assistant

## 2013-05-02 ENCOUNTER — Other Ambulatory Visit: Payer: Self-pay | Admitting: Cardiovascular Disease

## 2013-05-03 ENCOUNTER — Other Ambulatory Visit: Payer: Self-pay | Admitting: Family Medicine

## 2013-05-03 ENCOUNTER — Other Ambulatory Visit: Payer: Self-pay | Admitting: Cardiovascular Disease

## 2013-05-03 NOTE — Telephone Encounter (Signed)
Medication refilled per protocol. 

## 2013-05-22 ENCOUNTER — Encounter (HOSPITAL_COMMUNITY): Payer: Self-pay | Admitting: Emergency Medicine

## 2013-05-22 ENCOUNTER — Observation Stay (HOSPITAL_COMMUNITY): Payer: Medicaid Other

## 2013-05-22 ENCOUNTER — Observation Stay (HOSPITAL_COMMUNITY)
Admission: EM | Admit: 2013-05-22 | Discharge: 2013-05-23 | Disposition: A | Discharge status: Home / Self Care | Payer: Medicaid Other | Attending: Internal Medicine | Admitting: Internal Medicine

## 2013-05-22 ENCOUNTER — Emergency Department (HOSPITAL_COMMUNITY): Payer: Medicaid Other

## 2013-05-22 DIAGNOSIS — T8189XA Other complications of procedures, not elsewhere classified, initial encounter: Secondary | ICD-10-CM

## 2013-05-22 DIAGNOSIS — I252 Old myocardial infarction: Secondary | ICD-10-CM | POA: Insufficient documentation

## 2013-05-22 DIAGNOSIS — L301 Dyshidrosis [pompholyx]: Secondary | ICD-10-CM | POA: Insufficient documentation

## 2013-05-22 DIAGNOSIS — Z87898 Personal history of other specified conditions: Secondary | ICD-10-CM | POA: Diagnosis present

## 2013-05-22 DIAGNOSIS — I951 Orthostatic hypotension: Secondary | ICD-10-CM

## 2013-05-22 DIAGNOSIS — J449 Chronic obstructive pulmonary disease, unspecified: Secondary | ICD-10-CM | POA: Insufficient documentation

## 2013-05-22 DIAGNOSIS — I10 Essential (primary) hypertension: Secondary | ICD-10-CM

## 2013-05-22 DIAGNOSIS — Z951 Presence of aortocoronary bypass graft: Secondary | ICD-10-CM | POA: Insufficient documentation

## 2013-05-22 DIAGNOSIS — Z23 Encounter for immunization: Secondary | ICD-10-CM | POA: Insufficient documentation

## 2013-05-22 DIAGNOSIS — R0600 Dyspnea, unspecified: Secondary | ICD-10-CM

## 2013-05-22 DIAGNOSIS — E785 Hyperlipidemia, unspecified: Secondary | ICD-10-CM

## 2013-05-22 DIAGNOSIS — R55 Syncope and collapse: Secondary | ICD-10-CM

## 2013-05-22 DIAGNOSIS — R0789 Other chest pain: Principal | ICD-10-CM | POA: Insufficient documentation

## 2013-05-22 DIAGNOSIS — Z79899 Other long term (current) drug therapy: Secondary | ICD-10-CM | POA: Insufficient documentation

## 2013-05-22 DIAGNOSIS — Z9889 Other specified postprocedural states: Secondary | ICD-10-CM | POA: Insufficient documentation

## 2013-05-22 DIAGNOSIS — R252 Cramp and spasm: Secondary | ICD-10-CM

## 2013-05-22 DIAGNOSIS — Z7982 Long term (current) use of aspirin: Secondary | ICD-10-CM | POA: Insufficient documentation

## 2013-05-22 DIAGNOSIS — R0602 Shortness of breath: Secondary | ICD-10-CM | POA: Insufficient documentation

## 2013-05-22 DIAGNOSIS — I70219 Atherosclerosis of native arteries of extremities with intermittent claudication, unspecified extremity: Secondary | ICD-10-CM

## 2013-05-22 DIAGNOSIS — Z87891 Personal history of nicotine dependence: Secondary | ICD-10-CM | POA: Insufficient documentation

## 2013-05-22 DIAGNOSIS — R110 Nausea: Secondary | ICD-10-CM | POA: Insufficient documentation

## 2013-05-22 DIAGNOSIS — K429 Umbilical hernia without obstruction or gangrene: Secondary | ICD-10-CM

## 2013-05-22 DIAGNOSIS — M79609 Pain in unspecified limb: Secondary | ICD-10-CM

## 2013-05-22 DIAGNOSIS — I2510 Atherosclerotic heart disease of native coronary artery without angina pectoris: Secondary | ICD-10-CM

## 2013-05-22 DIAGNOSIS — R079 Chest pain, unspecified: Secondary | ICD-10-CM

## 2013-05-22 DIAGNOSIS — I2119 ST elevation (STEMI) myocardial infarction involving other coronary artery of inferior wall: Secondary | ICD-10-CM

## 2013-05-22 DIAGNOSIS — J4489 Other specified chronic obstructive pulmonary disease: Secondary | ICD-10-CM | POA: Insufficient documentation

## 2013-05-22 LAB — POCT I-STAT TROPONIN I
Troponin i, poc: 0 ng/mL (ref 0.00–0.08)
Troponin i, poc: 0.02 ng/mL (ref 0.00–0.08)

## 2013-05-22 LAB — BASIC METABOLIC PANEL
BUN: 9 mg/dL (ref 6–23)
CO2: 21 mEq/L (ref 19–32)
Calcium: 8.7 mg/dL (ref 8.4–10.5)
Chloride: 105 mEq/L (ref 96–112)
Creatinine, Ser: 1.15 mg/dL (ref 0.50–1.35)
GFR calc Af Amer: 84 mL/min — ABNORMAL LOW (ref >90)
GFR calc non Af Amer: 73 mL/min — ABNORMAL LOW (ref >90)
Glucose, Bld: 129 mg/dL — ABNORMAL HIGH (ref 70–99)
Potassium: 3.5 mEq/L — ABNORMAL LOW (ref 3.7–5.3)
Sodium: 143 mEq/L (ref 137–147)

## 2013-05-22 LAB — CBC
HCT: 40.3 % (ref 39.0–52.0)
Hemoglobin: 13.4 g/dL (ref 13.0–17.0)
MCH: 28.2 pg (ref 26.0–34.0)
MCHC: 33.3 g/dL (ref 30.0–36.0)
MCV: 84.8 fL (ref 78.0–100.0)
Platelets: 171 10*3/uL (ref 150–400)
RBC: 4.75 MIL/uL (ref 4.22–5.81)
RDW: 13.4 % (ref 11.5–15.5)
WBC: 6.5 10*3/uL (ref 4.0–10.5)

## 2013-05-22 LAB — D-DIMER, QUANTITATIVE: D-Dimer, Quant: 0.57 ug/mL-FEU — ABNORMAL HIGH (ref 0.00–0.48)

## 2013-05-22 LAB — PROTIME-INR
INR: 0.97 (ref 0.00–1.49)
Prothrombin Time: 12.7 seconds (ref 11.6–15.2)

## 2013-05-22 MED ORDER — MORPHINE SULFATE 4 MG/ML IJ SOLN
4.0000 mg | Freq: Once | INTRAMUSCULAR | Status: AC
  Administered 2013-05-22: 4 mg via INTRAVENOUS
  Filled 2013-05-22: qty 1

## 2013-05-22 MED ORDER — MOMETASONE FURO-FORMOTEROL FUM 100-5 MCG/ACT IN AERO
2.0000 | INHALATION_SPRAY | Freq: Two times a day (BID) | RESPIRATORY_TRACT | Status: DC
  Administered 2013-05-23: 2 via RESPIRATORY_TRACT
  Filled 2013-05-22 (×2): qty 8.8

## 2013-05-22 MED ORDER — TIOTROPIUM BROMIDE MONOHYDRATE 18 MCG IN CAPS
18.0000 ug | ORAL_CAPSULE | Freq: Every day | RESPIRATORY_TRACT | Status: DC
  Administered 2013-05-23: 18 ug via RESPIRATORY_TRACT
  Filled 2013-05-22 (×2): qty 5

## 2013-05-22 MED ORDER — KETOROLAC TROMETHAMINE 30 MG/ML IJ SOLN
30.0000 mg | Freq: Once | INTRAMUSCULAR | Status: AC
  Administered 2013-05-22: 30 mg via INTRAVENOUS
  Filled 2013-05-22: qty 1

## 2013-05-22 MED ORDER — IOHEXOL 350 MG/ML SOLN
100.0000 mL | Freq: Once | INTRAVENOUS | Status: AC | PRN
  Administered 2013-05-22: 85 mL via INTRAVENOUS

## 2013-05-22 MED ORDER — NITROGLYCERIN IN D5W 200-5 MCG/ML-% IV SOLN: 5.0000 ug/min | Freq: Once | INTRAVENOUS | Status: DC

## 2013-05-22 MED ORDER — HYDROMORPHONE HCL PF 1 MG/ML IJ SOLN
1.0000 mg | Freq: Once | INTRAMUSCULAR | Status: AC
  Administered 2013-05-22: 1 mg via INTRAVENOUS
  Filled 2013-05-22: qty 1

## 2013-05-22 MED ORDER — ATORVASTATIN CALCIUM 80 MG PO TABS
80.0000 mg | ORAL_TABLET | Freq: Every day | ORAL | Status: DC
  Administered 2013-05-23: 80 mg via ORAL
  Filled 2013-05-22 (×2): qty 1

## 2013-05-22 MED ORDER — POTASSIUM CHLORIDE CRYS ER 20 MEQ PO TBCR
40.0000 meq | EXTENDED_RELEASE_TABLET | Freq: Once | ORAL | Status: AC
  Administered 2013-05-22: 40 meq via ORAL
  Filled 2013-05-22: qty 2

## 2013-05-22 MED ORDER — ASPIRIN 325 MG PO TABS
325.0000 mg | ORAL_TABLET | Freq: Once | ORAL | Status: AC
  Administered 2013-05-22: 325 mg via ORAL
  Filled 2013-05-22: qty 1

## 2013-05-22 MED ORDER — NITROGLYCERIN 0.4 MG SL SUBL: 0.4000 mg | SUBLINGUAL_TABLET | SUBLINGUAL | Status: DC | PRN

## 2013-05-22 MED ORDER — CLOPIDOGREL BISULFATE 75 MG PO TABS
75.0000 mg | ORAL_TABLET | Freq: Every day | ORAL | Status: DC
  Administered 2013-05-23: 75 mg via ORAL
  Filled 2013-05-22: qty 1

## 2013-05-22 MED ORDER — IOHEXOL 300 MG/ML  SOLN: 100.0000 mL | Freq: Once | INTRAMUSCULAR | Status: DC | PRN

## 2013-05-22 MED ORDER — OXYCODONE HCL 5 MG PO TABS: 5.0000 mg | ORAL_TABLET | ORAL | Status: DC | PRN

## 2013-05-22 MED ORDER — ENOXAPARIN SODIUM 40 MG/0.4ML ~~LOC~~ SOLN
40.0000 mg | SUBCUTANEOUS | Status: DC
  Administered 2013-05-22: 40 mg via SUBCUTANEOUS
  Filled 2013-05-22 (×2): qty 0.4

## 2013-05-22 MED ORDER — NITROGLYCERIN 0.4 MG SL SUBL
0.4000 mg | SUBLINGUAL_TABLET | SUBLINGUAL | Status: AC | PRN
  Administered 2013-05-22 (×3): 0.4 mg via SUBLINGUAL
  Filled 2013-05-22: qty 25

## 2013-05-22 MED ORDER — MORPHINE SULFATE 4 MG/ML IJ SOLN
4.0000 mg | Freq: Once | INTRAMUSCULAR | Status: AC
  Administered 2013-05-22: 4 mg via INTRAVENOUS

## 2013-05-22 MED ORDER — ACETAMINOPHEN 325 MG PO TABS: 650.0000 mg | ORAL_TABLET | ORAL | Status: DC | PRN

## 2013-05-22 MED ORDER — POTASSIUM CHLORIDE CRYS ER 10 MEQ PO TBCR
10.0000 meq | EXTENDED_RELEASE_TABLET | Freq: Every day | ORAL | Status: DC
  Administered 2013-05-23: 10 meq via ORAL
  Filled 2013-05-22: qty 1

## 2013-05-22 MED ORDER — ONDANSETRON HCL 4 MG/2ML IJ SOLN: 4.0000 mg | Freq: Four times a day (QID) | INTRAMUSCULAR | Status: DC | PRN

## 2013-05-22 MED ORDER — LOSARTAN POTASSIUM 50 MG PO TABS
100.0000 mg | ORAL_TABLET | Freq: Every day | ORAL | Status: DC
  Administered 2013-05-23: 100 mg via ORAL
  Filled 2013-05-22 (×2): qty 2

## 2013-05-22 MED ORDER — HYDROCHLOROTHIAZIDE 25 MG PO TABS
25.0000 mg | ORAL_TABLET | Freq: Every day | ORAL | Status: DC
  Administered 2013-05-23: 25 mg via ORAL
  Filled 2013-05-22: qty 1

## 2013-05-22 MED ORDER — OXYCODONE-ACETAMINOPHEN 5-325 MG PO TABS
2.0000 | ORAL_TABLET | ORAL | Status: DC | PRN
  Administered 2013-05-22: 2 via ORAL
  Filled 2013-05-22: qty 2

## 2013-05-22 MED ORDER — INFLUENZA VAC SPLIT QUAD 0.5 ML IM SUSP
0.5000 mL | INTRAMUSCULAR | Status: AC
  Administered 2013-05-23: 0.5 mL via INTRAMUSCULAR
  Filled 2013-05-22: qty 0.5

## 2013-05-22 MED ORDER — ACETAMINOPHEN 500 MG PO TABS
1000.0000 mg | ORAL_TABLET | Freq: Once | ORAL | Status: AC
  Administered 2013-05-22: 1000 mg via ORAL
  Filled 2013-05-22: qty 2

## 2013-05-22 MED ORDER — ASPIRIN EC 81 MG PO TBEC
81.0000 mg | DELAYED_RELEASE_TABLET | Freq: Every day | ORAL | Status: DC
  Administered 2013-05-23: 81 mg via ORAL
  Filled 2013-05-22: qty 1

## 2013-05-22 MED ORDER — PNEUMOCOCCAL VAC POLYVALENT 25 MCG/0.5ML IJ INJ
0.5000 mL | INJECTION | INTRAMUSCULAR | Status: AC
  Administered 2013-05-23: 0.5 mL via INTRAMUSCULAR
  Filled 2013-05-22: qty 0.5

## 2013-05-22 NOTE — ED Notes (Signed)
Pt c/o CP for a couple of days getting progressively worse.  Pt rates 8/10 and describes as sharp/shooting.  Pt associates his CP with dizziness and "falling out".  Pt has taken his home dose of 81mg  Aspirin today.

## 2013-05-22 NOTE — ED Provider Notes (Signed)
CSN: [id number]     Arrival date & time 05/22/13  1255 History   First MD Initiated Contact with Patient 05/22/13 1305     Chief Complaint  Patient presents with  . Chest Pain   (Consider location/radiation/quality/duration/timing/severity/associated sxs/prior Treatment) HPI Comments: Patient is a 53 year old male with history of inferior MI, hypertension, dyslipidemia, COPD presents today for chest pain. His chest pain has been gradually worsening for the past 3 days. He describes it as a sharp shooting pain that radiates into his left arm. It is associated with diaphoresis and shortness of breath. He reports that sometimes his shortness of breath gets so bad that he passes out. This happened 3 times since yesterday.  The history is provided by the patient. No language interpreter was used.    Past Medical History  Diagnosis Date  . Coronary artery disease     STATUS POST CABG 01/2009  . 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 Dyslipidemia   . Hypertension   . Dyspnea   . Orthostatic hypotension   . Syncope and collapse   . Chest pain   . Myocardial infarction   . COPD (chronic obstructive pulmonary disease)   . Hyperlipidemia    Past Surgical History  Procedure Laterality Date  . Cardiac catheterization  10/19/2009  . Ankle surgery      x 2  . Sternal wires removal  07/08/2011    Procedure: STERNAL WIRES REMOVAL;  Surgeon: Grace Isaac, MD;  Location: [facility];  Service: Open Heart Surgery;  Laterality: N/A;  removal of 4th wire  . Pr vein bypass graft,aorto-fem-pop    . Coronary artery bypass graft  2010  . Colonoscopy  April 2013  . Foot surgery    . Kidney stimulator    . Hernia repair  01/23/12    umb hernia   Family History  Problem Relation Age of Onset  . Coronary artery disease Father   . Diabetes Father   . Hyperlipidemia Father   . Heart attack Father   . Heart disease Father     before age 42  . Hypertension Father   . Cancer Mother     uterine  .  Hyperlipidemia Mother   . Diabetes Mother   . Hypertension Mother   . Hypertension Brother    History  Substance Use Topics  . Smoking status: Former Smoker -- 3.00 packs/day for 30 years    Types: Cigarettes, Cigars    Quit date: 01/26/2009  . Smokeless tobacco: Never Used  . Alcohol Use: No    Review of Systems  Constitutional: Positive for diaphoresis. Negative for fever and chills.  Respiratory: Positive for shortness of breath.   Cardiovascular: Positive for chest pain.  Gastrointestinal: Positive for nausea. Negative for vomiting and abdominal pain.  All other systems reviewed and are negative.    Allergies  Crestor  Home Medications   Current Outpatient Rx  Name  Route  Sig  Dispense  Refill  . acetaminophen (TYLENOL) 500 MG tablet   Oral   Take 1 tablet (500 mg total) by mouth every 6 (six) hours as needed for pain.   30 tablet   0   . ADVAIR DISKUS 250-50 MCG/DOSE AEPB   Oral   Take 2 puffs by mouth daily as needed (for trouble breathing).          Marland Kitchen aspirin EC 81 MG tablet   Oral   Take 81 mg by mouth every morning.         Marland Kitchen  potassium chloride (K-DUR,KLOR-CON) 10 MEQ tablet      TAKE 1 TABLET BY MOUTH EVERY MORNING   30 tablet   0     PATIENT NEEDS APPOINTMENT   . roflumilast (DALIRESP) 500 MCG TABS tablet   Oral   Take 1 tablet (500 mcg total) by mouth daily.   30 tablet   3   . tiotropium (SPIRIVA) 18 MCG inhalation capsule   Inhalation   Place 1 capsule (18 mcg total) into inhaler and inhale daily.   30 capsule   5    BP 116/75  Pulse 61  Resp 16  SpO2 97% Physical Exam  Nursing note and vitals reviewed. Constitutional: He is oriented to person, place, and time. He appears well-developed and well-nourished. No distress.  HENT:  Head: Normocephalic and atraumatic.  Right Ear: External ear normal.  Left Ear: External ear normal.  Nose: Nose normal.  Eyes: Conjunctivae are normal.  Neck: Normal range of motion. No tracheal  deviation present.  Cardiovascular: Normal rate, regular rhythm and normal heart sounds.   Pulmonary/Chest: Effort normal and breath sounds normal. No stridor.  Abdominal: Soft. He exhibits no distension. There is no tenderness.  Musculoskeletal: Normal range of motion.  Neurological: He is alert and oriented to person, place, and time.  Skin: Skin is warm and dry. He is not diaphoretic.  Psychiatric: He has a normal mood and affect. His behavior is normal.    ED Course  Procedures (including critical care time) Labs Review Labs Reviewed  BASIC METABOLIC PANEL - Abnormal; Notable for the following:    Potassium 3.5 (*)    Glucose, Bld 129 (*)    GFR calc non Af Amer 73 (*)    GFR calc Af Amer 84 (*)    All other components within normal limits  D-DIMER, QUANTITATIVE - Abnormal; Notable for the following:    D-Dimer, Quant 0.57 (*)    All other components within normal limits  CBC  PROTIME-INR  POCT I-STAT TROPONIN I  POCT I-STAT TROPONIN I   Imaging Review Ct Angio Chest Pe W/cm &/or Wo Cm  05/22/2013   CLINICAL DATA:  Shortness of breath.  Chest pain.  EXAM: CT ANGIOGRAPHY CHEST WITH CONTRAST  TECHNIQUE: Multidetector CT imaging of the chest was performed using the standard protocol during bolus administration of intravenous contrast. Multiplanar CT image reconstructions including MIPs were obtained to evaluate the vascular anatomy.  CONTRAST:  8mL OMNIPAQUE IOHEXOL 350 MG/ML SOLN  COMPARISON:  02/10/2011  FINDINGS: The lungs are well inflated without consolidation or effusion. The heart is normal in size. There is subtle calcification along the left anterior descending coronary artery. There is no evidence of pulmonary embolism. There is no significant mediastinal, hilar or axillary adenopathy. There is minimal calcified plaque over the thoracic aorta. Sternotomy wires and mediastinal surgical clips are present.  Limited images through the upper abdomen are unremarkable. Remainder  the exam is unchanged.  Review of the MIP images confirms the above findings.  IMPRESSION: No acute cardiopulmonary disease and no evidence of pulmonary embolism.  Very minimal atherosclerotic coronary artery disease.   Electronically Signed   By: Marin Olp M.D.   On: 05/22/2013 17:17    EKG Interpretation    Date/Time:  Sunday May 22 2013 13:03:50 EST Ventricular Rate:  108 PR Interval:  124 QRS Duration: 84 QT Interval:  322 QTC Calculation: 431 R Axis:   -21 Text Interpretation:  Sinus tachycardia Inferior infarct , age undetermined Abnormal ECG  No ischemic changes Confirmed by Mingo Amber  MD, Cyrus ([id number]) on 05/22/2013 1:30:38 PM            MDM   1. Chest pain    Concern for cardiac etiology of Chest Pain. Cardiology has been consulted and will see patient in the ED for likely admit. Pt does not meet criteria for CP protocol and a further evaluation is recommended. Pt has been re-evaluated prior to consult and VSS, NAD, heart RRR, lungs CTAB. No acute abnormalities found on EKG and first round of cardiac enzymes negative. This case was discussed with Dr. Mingo Amber who has seen the patient and agrees with plan to admit.      Elwyn Lade, PA-C 05/22/13 [phone]

## 2013-05-22 NOTE — ED Notes (Signed)
[name] at bedside updating pt and family on plan of care and results.

## 2013-05-22 NOTE — Consult Note (Signed)
Cardiology Consultation Note  Patient ID: [name], MRN: [id number], DOB/AGE: [birth year] 53 y.o. Admit date: 05/22/2013   Date of Consult: 05/22/2013 Primary Physician: Odette Fraction, MD Primary Cardiologist: Cleatrice Burke   Chief Complaint: Chest pain  Reason for Consult: chest pain   HPI: 53 yr old male with hx of CAD s/p CABG 2010,s/p MI with PCI to native RCA in 2011 PVD s/p iliac stenting, smoker, HLD , HTN . Chronic sternal pain related to movement here for evaluation of CP and syncope.   Pt states that he has been having sharp, substernal pain that is worse with deep breath that ' shoots' across his chest to his arm for the past 2 -3 days. In addition with the episodes CP, he has SOB and near syncope/ synope while standing up. For these reason he came to the ER. Pt states that this is different to the CP he had prior to his CABG which is was more like heart burn .   Past Medical History  Diagnosis Date  . Coronary artery disease     STATUS POST CABG 01/2009  . 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 Dyslipidemia   . Hypertension   . Dyspnea   . Orthostatic hypotension   . Syncope and collapse   . Chest pain   . Myocardial infarction   . COPD (chronic obstructive pulmonary disease)   . Hyperlipidemia       Most Recent Cardiac Studies: Conclusions:  [place] [id number] 1. CAD: The LIMA, SVG to OM, SVG to RI are patent. The SVG to RCA is closed but the native RCA is open. He has small branch disease that may be causing his angina but no lesions that are amenable to PCI.  Well preserved LV function.    Low risk stress nuclear study with a small, moderate intensity, reversible inferobasal defect consistent with very mild inferior ischemia.  LV Ejection Fraction: 50%. LV Wall Motion: NL LV Function; NL Wall Motion  Kirk Ruths    Surgical History:  Past Surgical History  Procedure Laterality Date  . Cardiac catheterization  10/19/2009  . Ankle surgery      x 2  .  Sternal wires removal  07/08/2011    Procedure: STERNAL WIRES REMOVAL;  Surgeon: Grace Isaac, MD;  Location: [facility];  Service: Open Heart Surgery;  Laterality: N/A;  removal of 4th wire  . Pr vein bypass graft,aorto-fem-pop    . Coronary artery bypass graft  2010  . Colonoscopy  April 2013  . Foot surgery    . Kidney stimulator    . Hernia repair  01/23/12    umb hernia     Home Meds: Prior to Admission medications   Medication Sig Start Date End Date Taking? Authorizing Provider  ADVAIR DISKUS 250-50 MCG/DOSE AEPB Take 2 puffs by mouth daily as needed (for trouble breathing).  10/07/11  Yes Historical Provider, MD  aspirin EC 81 MG tablet Take 81 mg by mouth every morning.   Yes Historical Provider, MD  atorvastatin (LIPITOR) 80 MG tablet Take 80 mg by mouth daily.   Yes Historical Provider, MD  clopidogrel (PLAVIX) 75 MG tablet Take 75 mg by mouth daily with breakfast.   Yes Historical Provider, MD  hydrochlorothiazide (HYDRODIURIL) 25 MG tablet Take 25 mg by mouth daily.   Yes Historical Provider, MD  losartan (COZAAR) 100 MG tablet Take 100 mg by mouth daily.   Yes Historical Provider, MD  potassium chloride (K-DUR,KLOR-CON) 10 MEQ  tablet Take 10 mEq by mouth daily.   Yes Historical Provider, MD  tiotropium (SPIRIVA) 18 MCG inhalation capsule Place 1 capsule (18 mcg total) into inhaler and inhale daily. 01/20/13  Yes Susy Frizzle, MD    Inpatient Medications:     Allergies:  Allergies  Allergen Reactions  . Crestor [Rosuvastatin]     Muscle aches.    History   Social History  . Marital Status: Married    Spouse Name: N/A    Number of Children: N/A  . Years of Education: N/A   Occupational History  . Not on file.   Social History Main Topics  . Smoking status: Former Smoker -- 3.00 packs/day for 30 years    Types: Cigarettes, Cigars    Quit date: 01/26/2009  . Smokeless tobacco: Never Used  . Alcohol Use: No  . Drug Use: No  . Sexual Activity: Not  Currently   Other Topics Concern  . Not on file   Social History Narrative  . No narrative on file     Family History  Problem Relation Age of Onset  . Coronary artery disease Father   . Diabetes Father   . Hyperlipidemia Father   . Heart attack Father   . Heart disease Father     before age 71  . Hypertension Father   . Cancer Mother     uterine  . Hyperlipidemia Mother   . Diabetes Mother   . Hypertension Mother   . Hypertension Brother      Review of Systems: General: negative for chills, fever, night sweats or weight changes.  Cardiovascular: see HPI  Dermatological: negative for rash Respiratory: negative for cough or wheezing Urologic: negative for hematuria Abdominal: negative for nausea, vomiting, diarrhea, bright red blood per rectum, melena, or hematemesis Neurologic: negative for visual changes, syncope, or dizziness All other systems reviewed and are otherwise negative except as noted above.  Labs: No results found for this basename: CKTOTAL, CKMB, TROPONINI,  in the last 72 hours Lab Results  Component Value Date   WBC 6.5 05/22/2013   HGB 13.4 05/22/2013   HCT 40.3 05/22/2013   MCV 84.8 05/22/2013   PLT 171 05/22/2013    Recent Labs Lab 05/22/13 1332  NA 143  K 3.5*  CL 105  CO2 21  BUN 9  CREATININE 1.15  CALCIUM 8.7  GLUCOSE 129*   Lab Results  Component Value Date   CHOL 175 04/15/2013   HDL 36* 04/15/2013   LDLCALC 122* 04/15/2013   TRIG 86 04/15/2013   Lab Results  Component Value Date   DDIMER 0.57* 05/22/2013    Radiology/Studies:  Ct Angio Chest Pe W/cm &/or Wo Cm  05/22/2013   CLINICAL DATA:  Shortness of breath.  Chest pain.  EXAM: CT ANGIOGRAPHY CHEST WITH CONTRAST  TECHNIQUE: Multidetector CT imaging of the chest was performed using the standard protocol during bolus administration of intravenous contrast. Multiplanar CT image reconstructions including MIPs were obtained to evaluate the vascular anatomy.  CONTRAST:  90mL  OMNIPAQUE IOHEXOL 350 MG/ML SOLN  COMPARISON:  02/10/2011  FINDINGS: The lungs are well inflated without consolidation or effusion. The heart is normal in size. There is subtle calcification along the left anterior descending coronary artery. There is no evidence of pulmonary embolism. There is no significant mediastinal, hilar or axillary adenopathy. There is minimal calcified plaque over the thoracic aorta. Sternotomy wires and mediastinal surgical clips are present.  Limited images through the upper abdomen are unremarkable.  Remainder the exam is unchanged.  Review of the MIP images confirms the above findings.  IMPRESSION: No acute cardiopulmonary disease and no evidence of pulmonary embolism.  Very minimal atherosclerotic coronary artery disease.   Electronically Signed   By: Marin Olp M.D.   On: 05/22/2013 17:17    [id number] Tach with inferior infarct   Physical Exam: Blood pressure 116/75, pulse 61, resp. rate 16, SpO2 97.00%. General: Well developed, well nourished, in no acute distress. Head: Normocephalic, atraumatic, sclera non-icteric, no xanthomas, nares are without discharge.  Neck: Negative for carotid bruits. JVD not elevated. Lungs: Clear bilaterally to auscultation without wheezes, rales, or rhonchi. Breathing is unlabored. Heart: RRR with S1 S2. No murmurs, rubs, or gallops appreciated. Abdomen: Soft, non-tender, non-distended with normoactive bowel sounds. No hepatomegaly. No rebound/guarding. No obvious abdominal masses. Msk:  Strength and tone appear normal for age. Extremities: No clubbing or cyanosis. No edema.  Distal pedal pulses are 2+ and equal bilaterally. Neuro: Alert and oriented X 3. No facial asymmetry. No focal deficit. Moves all extremities spontaneously. Psych:  Responds to questions appropriately with a normal affect.     Assessment and Recs:  Syncope  Chest pain - appears noncardiac and pleuritic  CAD/CABG   Recs Decrease losartan to 50 mg QD    Monitor on tele, check orthostatics, hydrate , liberalize BP  Rule out for ACS with serial CE/ EKG  Repeat echo in am    Signed, Arnoldo Lenis, A M.D  05/22/2013, 7:19 PM

## 2013-05-22 NOTE — ED Notes (Signed)
Pt at CT

## 2013-05-22 NOTE — ED Notes (Signed)
CT notified of IV access 

## 2013-05-22 NOTE — ED Notes (Signed)
Lab called to add on PT-INR 

## 2013-05-22 NOTE — H&P (Signed)
[facility] History and Physical  AKASH HEBDON [id number] DOB: 1960/10/12 DOA: 05/22/2013  Referring physician: Dr Mingo Amber PCP: Odette Fraction, MD   Chief Complaint: chest pain x 2 days  HPI:  53 year old male with history of coronary artery disease status post CABG in 2010, history of acute MI in 2011 (status post cardiac cath), hypertension, dyslipidemia, COPD, vascular disease status post bypass grafting who presented to the ED with symptoms of chest pain for past 2 days. Patient reports upper left-sided chest pain which is sharp in nature, pain lasting in density and occasionally radiating to his left arm lasting for about 30 seconds. Patient reports having several episodes of the chest pain symptoms since yesterday. He also reports having 3 syncopal event since yesterday (one episode yesterday and 2 episodes today.) He reports having warning sign of syncope and being diaphoretic. He slumped on the ground but did not hit his head or injure anywhere. He denies any seizure-like activity,, white, bowel or urinary incontinence or any weakness of his extremities. He denies similar symptoms in the past. He denies any recent travel or sick contacts. He denies any trauma to the chest or lifting any heavy weights. He denies fever, chills, headache, blurred vision, nausea, vomiting, abdominal pain, bowel or urinary symptoms, muscle or joint pains. He denies any change in his medications.   Course in the ED Patient's vitals were stable except for mild tachycardia. Blood work done the short low potassium of 2.5. A CT angiogram of the chest to rule out for PE was done given his chest pain symptoms and acute syncopal episode which was negative for PE. EKG was unremarkable. Initial troponin was negative. Cardiology was consulted who recommended monitoring on telemetry to rule out for ACS. Prior hospitalist consulted for admission.  Review of Systems:  Constitutional: Denies fever, chills,  diaphoresis, appetite change and fatigue.  HEENT: Denies  photophobia, eye pain, , hearing loss, ear pain, congestion, sore throat, rhinorrhea, sneezing, mouth sores, trouble swallowing, neck pain, neck stiffness and tinnitus.   Respiratory: Denies SOB, DOE, cough, chest tightness,  and wheezing.   Cardiovascular:  chest pain,  denies palpitations and leg swelling.  Gastrointestinal: Denies nausea, vomiting, abdominal pain, diarrhea, constipation, blood in stool and abdominal distention.  Genitourinary: Denies dysuria, urgency, frequency, hematuria, flank pain and difficulty urinating.  Endocrine: Denies  polyuria, polydipsia. Musculoskeletal: Denies myalgias, back pain, joint swelling, arthralgias and gait problem.  Neurological: dizziness,  syncope,  denies seizure, weakness, light-headedness, numbness and headaches.  Psychiatric/Behavioral: Denies confusion, nervousness, sleep disturbance and agitation   Past Medical History  Diagnosis Date  . Coronary artery disease     STATUS POST CABG 01/2009  . 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 Dyslipidemia   . Hypertension   . Dyspnea   . Orthostatic hypotension   . Syncope and collapse   . Chest pain   . Myocardial infarction   . COPD (chronic obstructive pulmonary disease)   . Hyperlipidemia    Past Surgical History  Procedure Laterality Date  . Cardiac catheterization  10/19/2009  . Ankle surgery      x 2  . Sternal wires removal  07/08/2011    Procedure: STERNAL WIRES REMOVAL;  Surgeon: Grace Isaac, MD;  Location: [facility];  Service: Open Heart Surgery;  Laterality: N/A;  removal of 4th wire  . Pr vein bypass graft,aorto-fem-pop    . Coronary artery bypass graft  2010  . Colonoscopy  April 2013  . Foot surgery    .  Kidney stimulator    . Hernia repair  01/23/12    umb hernia   Social History:  reports that he quit smoking about 4 years ago. His smoking use included Cigarettes and Cigars. He has a 90 pack-year smoking history. He  has never used smokeless tobacco. He reports that he does not drink alcohol or use illicit drugs.  Allergies  Allergen Reactions  . Crestor [Rosuvastatin]     Muscle aches.    Family History  Problem Relation Age of Onset  . Coronary artery disease Father   . Diabetes Father   . Hyperlipidemia Father   . Heart attack Father   . Heart disease Father     before age 51  . Hypertension Father   . Cancer Mother     uterine  . Hyperlipidemia Mother   . Diabetes Mother   . Hypertension Mother   . Hypertension Brother     Prior to Admission medications   Medication Sig Start Date End Date Taking? Authorizing Provider  ADVAIR DISKUS 250-50 MCG/DOSE AEPB Take 2 puffs by mouth daily as needed (for trouble breathing).  10/07/11  Yes Historical Provider, MD  aspirin EC 81 MG tablet Take 81 mg by mouth every morning.   Yes Historical Provider, MD  atorvastatin (LIPITOR) 80 MG tablet Take 80 mg by mouth daily.   Yes Historical Provider, MD  clopidogrel (PLAVIX) 75 MG tablet Take 75 mg by mouth daily with breakfast.   Yes Historical Provider, MD  hydrochlorothiazide (HYDRODIURIL) 25 MG tablet Take 25 mg by mouth daily.   Yes Historical Provider, MD  losartan (COZAAR) 100 MG tablet Take 100 mg by mouth daily.   Yes Historical Provider, MD  potassium chloride (K-DUR,KLOR-CON) 10 MEQ tablet Take 10 mEq by mouth daily.   Yes Historical Provider, MD  tiotropium (SPIRIVA) 18 MCG inhalation capsule Place 1 capsule (18 mcg total) into inhaler and inhale daily. 01/20/13  Yes Susy Frizzle, MD    Physical Exam:  Filed Vitals:   05/22/13 1800 05/22/13 1830 05/22/13 1900 05/22/13 1930  BP: 120/71 124/80 116/75 114/62  Pulse: 61 60 61 58  Resp:      SpO2: 98% 98% 97% 97%    Constitutional: Vital signs reviewed. Middle aged male lying in bed in no acute distress HEENT: No pallor, moist oral mucosa, no icterus, no cervical lymphadenopathy, no carotid bruit Cardiovascular: RRR, S1 normal, S2  normal, no MRG, reproducible pain on palpation over her left lateral chest . Pulmonary/Chest: CTAB, no wheezes, rales, or rhonchi Abdominal: Soft. Non-tender, non-distended, bowel sounds present Extremities: Warm, no edema CNS: AAO x3   Labs on Admission:  Basic Metabolic Panel:  Recent Labs Lab 05/22/13 1332  NA 143  K 3.5*  CL 105  CO2 21  GLUCOSE 129*  BUN 9  CREATININE 1.15  CALCIUM 8.7   Liver Function Tests: No results found for this basename: AST, ALT, ALKPHOS, BILITOT, PROT, ALBUMIN,  in the last 168 hours No results found for this basename: LIPASE, AMYLASE,  in the last 168 hours No results found for this basename: AMMONIA,  in the last 168 hours CBC:  Recent Labs Lab 05/22/13 1332  WBC 6.5  HGB 13.4  HCT 40.3  MCV 84.8  PLT 171   Cardiac Enzymes: No results found for this basename: CKTOTAL, CKMB, CKMBINDEX, TROPONINI,  in the last 168 hours BNP: No components found with this basename: POCBNP,  CBG: No results found for this basename: GLUCAP,  in the  last 168 hours  Radiological Exams on Admission: Ct Angio Chest Pe W/cm &/or Wo Cm  05/22/2013   CLINICAL DATA:  Shortness of breath.  Chest pain.  EXAM: CT ANGIOGRAPHY CHEST WITH CONTRAST  TECHNIQUE: Multidetector CT imaging of the chest was performed using the standard protocol during bolus administration of intravenous contrast. Multiplanar CT image reconstructions including MIPs were obtained to evaluate the vascular anatomy.  CONTRAST:  9mL OMNIPAQUE IOHEXOL 350 MG/ML SOLN  COMPARISON:  02/10/2011  FINDINGS: The lungs are well inflated without consolidation or effusion. The heart is normal in size. There is subtle calcification along the left anterior descending coronary artery. There is no evidence of pulmonary embolism. There is no significant mediastinal, hilar or axillary adenopathy. There is minimal calcified plaque over the thoracic aorta. Sternotomy wires and mediastinal surgical clips are present.   Limited images through the upper abdomen are unremarkable. Remainder the exam is unchanged.  Review of the MIP images confirms the above findings.  IMPRESSION: No acute cardiopulmonary disease and no evidence of pulmonary embolism.  Very minimal atherosclerotic coronary artery disease.   Electronically Signed   By: Marin Olp M.D.   On: 05/22/2013 17:17    EKG:  sinus tachycardia at 108, no ST-T changes   Assessment/Plan  Principal Problem:   Chest pain Admit under observation on telemetry His heart score is only 4. Given siginficant hx of CAD will r/o for ACS with serial CE and troponin.  appreciate cardiology consult. Patient does have reproducible pain and suggest this to be likely musculoskeletal. he still complains all 4/10 pain over the left chest but appears again to be musculoskeletal and reproducible.  check 2 D echo. Resume home dose ASA, plavix and statin. Add s/l nitrate and percocet prn for pain.  Active Problems:   Coronary artery disease Hx of CABG. Resume ASA, plavix and statin.     Dyslipidemia Check LDL in am continue statin    Hypertension resume home meds  check orthostatic vitals    Syncope and collapse Reports this to be new. His medical hx does report hx of syncope but patient denies it. The episodes have been short with prodromal symptoms of dizziness. Denies having any head injury or seizure like activity.  Symptoms sound to be cardiogenic vs vasovagal. Will check CT head . Cannot get MRI due to stimulator in the back. Will get 2D echo dn carotid dopplers. Check orthostatic vitals. Neurochecks. PT eval in am.  if inpatient w/up negative. Will need holter monitoring as outpt   Hx of PVD  follows with vascular sx   DVT prophylaxis: sq lovenox  Diet: cardiac  Code Status: full code Family Communication:none at bedside Disposition Plan: home once w/up completed  Louellen Molder [facility] Pager [phone]  If 7PM-7AM, please contact  night-coverage [email or web address] Password [facility] 05/22/2013, 7:58 PM  Total time spent: 50 minutes

## 2013-05-22 NOTE — ED Provider Notes (Signed)
Medical screening examination/treatment/procedure(s) were conducted as a shared visit with non-physician practitioner(s) and myself.  I personally evaluated the patient during the encounter.  EKG Interpretation    Date/Time:  Sunday May 22 2013 13:03:50 EST Ventricular Rate:  108 PR Interval:  124 QRS Duration: 84 QT Interval:  322 QTC Calculation: 431 R Axis:   -21 Text Interpretation:  Sinus tachycardia Inferior infarct , age undetermined Abnormal ECG No ischemic changes Confirmed by Mingo Amber  MD, Audia Amick ([id number]) on 05/22/2013 1:30:38 PM             Hx of CABG, CAD - presents with chest pain. Active CP here. EKG without ischemic changes. Lungs clear, belly benign. Multiple days of chest pain. Troponins normal here. Seen by Cardiology, Dr. Inda Castle feels patient is stable to be admitted by Hospitalist. Hospitalist admitting.  Osvaldo Shipper, MD 05/22/13 2001

## 2013-05-23 ENCOUNTER — Observation Stay (HOSPITAL_COMMUNITY): Payer: Medicaid Other

## 2013-05-23 ENCOUNTER — Encounter (HOSPITAL_COMMUNITY): Payer: Medicaid Other

## 2013-05-23 DIAGNOSIS — R55 Syncope and collapse: Secondary | ICD-10-CM

## 2013-05-23 DIAGNOSIS — I10 Essential (primary) hypertension: Secondary | ICD-10-CM

## 2013-05-23 DIAGNOSIS — R0609 Other forms of dyspnea: Secondary | ICD-10-CM

## 2013-05-23 DIAGNOSIS — E785 Hyperlipidemia, unspecified: Secondary | ICD-10-CM

## 2013-05-23 DIAGNOSIS — R0989 Other specified symptoms and signs involving the circulatory and respiratory systems: Secondary | ICD-10-CM

## 2013-05-23 DIAGNOSIS — R079 Chest pain, unspecified: Secondary | ICD-10-CM

## 2013-05-23 DIAGNOSIS — I951 Orthostatic hypotension: Secondary | ICD-10-CM

## 2013-05-23 LAB — TROPONIN I
Troponin I: <0.3 ng/mL (ref <0.30)
Troponin I: <0.3 ng/mL (ref <0.30)
Troponin I: <0.3 ng/mL (ref <0.30)

## 2013-05-23 MED ORDER — TECHNETIUM TC 99M SESTAMIBI GENERIC - CARDIOLITE
10.0000 | Freq: Once | INTRAVENOUS | Status: AC | PRN
  Administered 2013-05-23: 10 via INTRAVENOUS

## 2013-05-23 MED ORDER — TECHNETIUM TC 99M SESTAMIBI GENERIC - CARDIOLITE
30.0000 | Freq: Once | INTRAVENOUS | Status: AC | PRN
  Administered 2013-05-23: 30 via INTRAVENOUS

## 2013-05-23 MED ORDER — REGADENOSON 0.4 MG/5ML IV SOLN
INTRAVENOUS | Status: AC
  Administered 2013-05-23: 0.4 mg
  Filled 2013-05-23: qty 5

## 2013-05-23 NOTE — Progress Notes (Signed)
Subjective: Patient denies CP  No SOB  On questioning the patient says he began having CP 2 days ago.  Episodes were sharp.  Lasting about 2 min.  Breathing would ease it a little  No chest pressure.  No cough, fevers or chills. The patient says the episodes would occur about every 20 min  With and without activity  Has noted some increased DOE recently.    Yesterday he had 2 syncopal spells  First episode occurred around 11 AM  Was in bed because of all the CP   Sat up  Was having CP at time  Got dizzy  Felt heart race.  Passed out.  Wife said lasted 15 sec.  Laid back down Second spell occurred around 3 pM  Had been to BR and was going to living room  Felt funny  Sat on couch and passed out  Again, about 15 seconds.  CP during.  Today has had no spells of dizziness or CP Before yesterday had never passed out  Objective: Filed Vitals:   05/23/13 0400 05/23/13 0401 05/23/13 0403 05/23/13 0406  BP: 135/77 135/89 128/85 121/83  Pulse: 51 61 58 57  Temp: 97.7 F (36.5 C)     TempSrc: Oral     Resp: 18  18   Height:      Weight:      SpO2: 100%      Weight change:  No intake or output data in the 24 hours ending 05/23/13 0759  General: Alert, awake, oriented x3, in no acute distress Neck:  JVP is normal Heart: Regular rate and rhythm, without murmurs, rubs, gallops.  Lungs: Clear to auscultation.  No rales or wheezes. Exemities:  No edema.   Neuro: Grossly intact, nonfocal.  Tele:  SR Lab Results: Results for orders placed during the hospital encounter of 05/22/13 (from the past 24 hour(s))  CBC     Status: None   Collection Time    05/22/13  1:32 PM      Result Value Range   WBC 6.5  4.0 - 10.5 K/uL   RBC 4.75  4.22 - 5.81 MIL/uL   Hemoglobin 13.4  13.0 - 17.0 g/dL   HCT 40.3  39.0 - 52.0 %   MCV 84.8  78.0 - 100.0 fL   MCH 28.2  26.0 - 34.0 pg   MCHC 33.3  30.0 - 36.0 g/dL   RDW 13.4  11.5 - 15.5 %   Platelets 171  150 - 400 K/uL  BASIC METABOLIC PANEL     Status:  Abnormal   Collection Time    05/22/13  1:32 PM      Result Value Range   Sodium 143  137 - 147 mEq/L   Potassium 3.5 (*) 3.7 - 5.3 mEq/L   Chloride 105  96 - 112 mEq/L   CO2 21  19 - 32 mEq/L   Glucose, Bld 129 (*) 70 - 99 mg/dL   BUN 9  6 - 23 mg/dL   Creatinine, Ser 1.15  0.50 - 1.35 mg/dL   Calcium 8.7  8.4 - 10.5 mg/dL   GFR calc non Af Amer 73 (*) >90 mL/min   GFR calc Af Amer 84 (*) >90 mL/min  D-DIMER, QUANTITATIVE     Status: Abnormal   Collection Time    05/22/13  1:32 PM      Result Value Range   D-Dimer, Quant 0.57 (*) 0.00 - 0.48 ug/mL-FEU  PROTIME-INR     Status:  None   Collection Time    05/22/13  1:32 PM      Result Value Range   Prothrombin Time 12.7  11.6 - 15.2 seconds   INR 0.97  0.00 - 1.49  POCT I-STAT TROPONIN I     Status: None   Collection Time    05/22/13  1:44 PM      Result Value Range   Troponin i, poc 0.00  0.00 - 0.08 ng/mL   Comment 3           POCT I-STAT TROPONIN I     Status: None   Collection Time    05/22/13  5:25 PM      Result Value Range   Troponin i, poc 0.02  0.00 - 0.08 ng/mL   Comment 3           TROPONIN I     Status: None   Collection Time    05/22/13 11:55 PM      Result Value Range   Troponin I <0.30  <0.30 ng/mL  TROPONIN I     Status: None   Collection Time    05/23/13  4:15 AM      Result Value Range   Troponin I <0.30  <0.30 ng/mL    Studies/Results: @[id number] @  Medications: Reviewed   @PROBHOSP @ 1.  CP  Asymptomatic now.  Symptoms atypical but 2 spellls assocated with syncope.  Has r/o for MI Plan myoview today.  If neg for ischemia would d/c with event monitor and outpatient f/u    2.  CAD  S/p CABG 2010  Last cath 06/2012:  LAD 50 to 605  Distal LAD fills vial LIMA; LCX  )M1 70%;  Ramus 100% prox; RCA:  prox with 30 to 40% in stent restenosis; Inf branch of pda a small vessel with 70%  Too small for RCI  Distal RCA fills via L to R collaterals; LIMA to LAD patent; SVG to OM patent:  SVG to PI patent;  SVG to RCA  Occluded.  LVEF 50 to 55% with inf hypokinesis.  myoview after showed small moder intensity inferobasal defect c/w very mild ischeia.    3.  SYncope  Patient was not orthostatic on exam yesterday.  Spells associated with CP and palpitations.  Follow on telemetry  May set up for event monitor.  Myoview today.    3.  HTN  Follow    4.  HL  Continue meds.      LOS: 1 day   Dorris Carnes 05/23/2013, 7:59 AM

## 2013-05-23 NOTE — Progress Notes (Signed)
UR completed 

## 2013-05-23 NOTE — Discharge Instructions (Signed)
Stroke Prevention Some health problems and behaviors may make it more likely for you to have a stroke. Below are ways to lessen your risk of having a stroke.   Be active for at least 30 minutes on most or all days.  Do not smoke. Try not to be around others who smoke.  Do not drink too much alcohol.  Do not have more than 2 drinks a day if you are a man.  Do not have more than 1 drink a day if you are a woman and are not pregnant.  Eat healthy foods, such as fruits and vegetables. If you were put on a specific diet, follow the diet as told.  Keep your cholesterol levels under control through diet and medicines. Look for foods that are low in saturated fat, trans fat, cholesterol, and are high in fiber.  If you have diabetes, follow all diet plans and take your medicine as told.  If you have high blood pressure (hypertension), follow all diet plans and take your medicine as told.  Keep a healthy weight. Eat foods that are low in calories, salt, saturated fat, trans fat, and cholesterol.  Do not take drugs.  Avoid birth control pills, if this applies. Talk to your doctor about the risks of taking birth control pills.  Talk to your doctor if you have sleep problems (sleep apnea).  Take all medicine as told by your doctor.  You may be told to take aspirin or blood thinner medicine. Take this medicine as told by your doctor.  Understand your medicine instructions.  Make sure any other conditions you have are being taken care of. GET HELP RIGHT AWAY IF:  You suddenly lose feeling (you feel numb) or have weakness in your face, arm, or leg.  Your face or eyelid hangs down to one side.  You suddenly feel confused.  You have trouble talking (aphasia) or understanding what people are saying.  You suddenly have trouble seeing in one or both eyes.  You suddenly have trouble walking.  You are dizzy.  You lose your balance or your movements are clumsy (uncoordinated).  You  suddenly have a very bad headache and you do not know the cause.  You have new chest pain.  Your heart feels like it is fluttering or skipping a beat (irregular heartbeat). Do not wait to see if the symptoms above go away. Get help right away. Call your local emergency services (911 in [place] Do not drive yourself to the hospital. Document Released: 10/14/2011 Document Revised: 02/02/2013 Document Reviewed: 10/15/2012 [facility] Patient Information 2014 [place].   STROKE/TIA DISCHARGE INSTRUCTIONS SMOKING Cigarette smoking nearly doubles your risk of having a stroke & is the single most alterable risk factor  If you smoke or have smoked in the last 12 months, you are advised to quit smoking for your health.  Most of the excess cardiovascular risk related to smoking disappears within a year of stopping.  Ask you doctor about anti-smoking medications  [place] Quit Line: 1-800-QUIT NOW  Free Smoking Cessation Classes [phone]  CHOLESTEROL Know your levels; limit fat & cholesterol in your diet  Lipid Panel     Component Value Date/Time   CHOL 175 04/15/2013 0841   TRIG 86 04/15/2013 0841   HDL 36* 04/15/2013 0841   CHOLHDL 4.9 04/15/2013 0841   VLDL 17 04/15/2013 0841   LDLCALC 122* 04/15/2013 0841      Many patients benefit from treatment even if their cholesterol is at goal.  Goal: Total Cholesterol (CHOL) less than 160  Goal:  Triglycerides (TRIG) less than 150  Goal:  HDL greater than 40  Goal:  LDL (LDLCALC) less than 100   BLOOD PRESSURE American Stroke Association blood pressure target is less that 120/80 mm/Hg  Your discharge blood pressure is:  BP: 131/75 mmHg  Monitor your blood pressure  Limit your salt and alcohol intake  Many individuals will require more than one medication for high blood pressure  DIABETES (A1c is a blood sugar average for last 3 months) Goal HGBA1c is under 7% (HBGA1c is blood sugar average for last 3 months)  Diabetes: No known  diagnosis of diabetes    Lab Results  Component Value Date   HGBA1C  Value: 5.5 (NOTE) The ADA recommends the following therapeutic goal for glycemic control related to Hgb A1c measurement: Goal of therapy: <6.5 Hgb A1c  Reference: American Diabetes Association: Clinical Practice Recommendations 2010, Diabetes Care, 2010, 33: (Suppl  1). 05/10/2009     Your HGBA1c can be lowered with medications, healthy diet, and exercise.  Check your blood sugar as directed by your physician  Call your physician if you experience unexplained or low blood sugars.  PHYSICAL ACTIVITY/REHABILITATION Goal is 30 minutes at least 4 days per week  Activity: No restrictions. Therapies:  Return to work:   Activity decreases your risk of heart attack and stroke and makes your heart stronger.  It helps control your weight and blood pressure; helps you relax and can improve your mood.  Participate in a regular exercise program.  Talk with your doctor about the best form of exercise for you (dancing, walking, swimming, cycling).  DIET/WEIGHT Goal is to maintain a healthy weight  Your discharge diet is: Cardiac Regular liquids Your height is:  Height: 5\' 9"  (175.3 cm) Your current weight is: Weight: 109.181 kg (240 lb 11.2 oz) Your Body Mass Index (BMI) is:  BMI (Calculated): 35.6  Following the type of diet specifically designed for you will help prevent another stroke.  Your goal weight range is:  129-163  Your goal Body Mass Index (BMI) is 19-24.  Healthy food habits can help reduce 3 risk factors for stroke:  High cholesterol, hypertension, and excess weight.  RESOURCES Stroke/Support Group:  Call [phone]   STROKE EDUCATION PROVIDED/REVIEWED AND GIVEN TO PATIENT Stroke warning signs and symptoms How to activate emergency medical system (call 911). Medications prescribed at discharge. Need for follow-up after discharge. Personal risk factors for stroke. Pneumonia vaccine given: YES 05/23/2013 Flu  vaccine given: Yes 05/23/2013 My questions have been answered, the writing is legible, and I understand these instructions.  I will adhere to these goals & educational materials that have been provided to me after my discharge from the hospital.   Cardiac Diet This diet can help prevent heart disease and stroke. Many factors influence your heart health, including eating and exercise habits. Coronary risk rises a lot with abnormal blood fat (lipid) levels. Cardiac meal planning includes limiting unhealthy fats, increasing healthy fats, and making other small dietary changes. General guidelines are as follows:  Adjust calorie intake to reach and maintain desirable body weight.  Limit total fat intake to less than 30% of total calories. Saturated fat should be less than 7% of calories.  Saturated fats are found in animal products and in some vegetable products. Saturated vegetable fats are found in coconut oil, cocoa butter, palm oil, and palm kernel oil. Read labels carefully to avoid these products as much as possible.  Use butter in moderation. Choose tub margarines and oils that have 2 grams of fat or less. Good cooking oils are canola and olive oils.  Practice low-fat cooking techniques. Do not fry food. Instead, broil, bake, boil, steam, grill, roast on a rack, stir-fry, or microwave it. Other fat reducing suggestions include:  Remove the skin from poultry.  Remove all visible fat from meats.  Skim the fat off stews, soups, and gravies before serving them.  Steam vegetables in water or broth instead of sauting them in fat.  Avoid foods with trans fat (or hydrogenated oils), such as commercially fried foods and commercially baked goods. Commercial shortening and deep-frying fats will contain trans fat.  Increase intake of fruits, vegetables, whole grains, and legumes to replace foods high in fat.  Increase consumption of nuts, legumes, and seeds to at least 4 servings weekly. One serving  of a legume equals  cup, and 1 serving of nuts or seeds equals  cup.  Choose whole grains more often. Have 3 servings per day (a serving is 1 ounce [oz]).  Eat 4 to 5 servings of vegetables per day. A serving of vegetables is 1 cup of raw leafy vegetables;  cup of raw or cooked cut-up vegetables;  cup of vegetable juice.  Eat 4 to 5 servings of fruit per day. A serving of fruit is 1 medium whole fruit;  cup of dried fruit;  cup of fresh, frozen, or canned fruit;  cup of 100% fruit juice.  Increase your intake of dietary fiber to 20 to 30 grams per day. Insoluble fiber may help lower your risk of heart disease and may help curb your appetite.  Soluble fiber binds cholesterol to be removed from the blood. Foods high in soluble fiber are dried beans, citrus fruits, oats, apples, bananas, broccoli, Brussels sprouts, and eggplant.  Try to include foods fortified with plant sterols or stanols, such as yogurt, breads, juices, or margarines. Choose several fortified foods to achieve a daily intake of 2 to 3 grams of plant sterols or stanols.  Foods with omega-3 fats can help reduce your risk of heart disease. Aim to have a 3.5 oz portion of fatty fish twice per week, such as salmon, mackerel, albacore tuna, sardines, lake trout, or herring. If you wish to take a fish oil supplement, choose one that contains 1 gram of both DHA and EPA.  Limit processed meats to 2 servings (3 oz portion) weekly.  Limit the sodium in your diet to 1500 milligrams (mg) per day. If you have high blood pressure, talk to a registered dietitian about a DASH (Dietary Approaches to Stop Hypertension) eating plan.  Limit sweets and beverages with added sugar, such as soda, to no more than 5 servings per week. One serving is:   1 tablespoon sugar.  1 tablespoon jelly or jam.   cup sorbet.  1 cup lemonade.   cup regular soda. CHOOSING FOODS Starches  Allowed: Breads: All kinds (wheat, rye, raisin, white,  oatmeal, [place], [place], and English muffin bread). Low-fat rolls: English muffins, frankfurter and hamburger buns, bagels, pita bread, tortillas (not fried). Pancakes, waffles, biscuits, and muffins made with recommended oil.  Avoid: Products made with saturated or trans fats, oils, or whole milk products. Butter rolls, cheese breads, croissants. Commercial doughnuts, muffins, sweet rolls, biscuits, waffles, pancakes, store-bought mixes. Crackers  Allowed: Low-fat crackers and snacks: Animal, graham, rye, saltine (with recommended oil, no lard), oyster, and matzo crackers. Bread sticks, melba toast, rusks, flatbread, pretzels, and  light popcorn.  Avoid: High-fat crackers: cheese crackers, butter crackers, and those made with coconut, palm oil, or trans fat (hydrogenated oils). Buttered popcorn. Cereals  Allowed: Hot or cold whole-grain cereals.  Avoid: Cereals containing coconut, hydrogenated vegetable fat, or animal fat. Potatoes / Pasta / Rice  Allowed: All kinds of potatoes, rice, and pasta (such as macaroni, spaghetti, and noodles).  Avoid: Pasta or rice prepared with cream sauce or high-fat cheese. Chow mein noodles, [place] fries. Vegetables  Allowed: All vegetables and vegetable juices.  Avoid: Fried vegetables. Vegetables in cream, butter, or high-fat cheese sauces. Limit coconut. Fruit in cream or custard. Protein  Allowed: Limit your intake of meat, seafood, and poultry to no more than 6 oz (cooked weight) per day. All lean, well-trimmed beef, veal, pork, and lamb. All chicken and [place] without skin. All fish and shellfish. Wild game: wild duck, rabbit, pheasant, and venison. Egg whites or low-cholesterol egg substitutes may be used as desired. Meatless dishes: recipes with dried beans, peas, lentils, and tofu (soybean curd). Seeds and nuts: all seeds and most nuts.  Avoid: Prime grade and other heavily marbled and fatty meats, such as short ribs, spare ribs, rib eye roast or  steak, frankfurters, sausage, bacon, and high-fat luncheon meats, mutton. Caviar. Commercially fried fish. Domestic duck, goose, venison sausage. Organ meats: liver, gizzard, heart, chitterlings, brains, kidney, sweetbreads. Dairy  Allowed: Low-fat cheeses: nonfat or low-fat cottage cheese (1% or 2% fat), cheeses made with part skim milk, such as mozzarella, farmers, string, or ricotta. (Cheeses should be labeled no more than 2 to 6 grams fat per oz.). Skim (or 1%) milk: liquid, powdered, or evaporated. Buttermilk made with low-fat milk. Drinks made with skim or low-fat milk or cocoa. Chocolate milk or cocoa made with skim or low-fat (1%) milk. Nonfat or low-fat yogurt.  Avoid: Whole milk cheeses, including colby, cheddar, [place], Monterey Jack, [place], [place], [place], American, Swiss, and blue. Creamed cottage cheese, cream cheese. Whole milk and whole milk products, including buttermilk or yogurt made from whole milk, drinks made from whole milk. Condensed milk, evaporated whole milk, and 2% milk. Soups and Combination Foods  Allowed: Low-fat low-sodium soups: broth, dehydrated soups, homemade broth, soups with the fat removed, homemade cream soups made with skim or low-fat milk. Low-fat spaghetti, lasagna, chili, and Spanish rice if low-fat ingredients and low-fat cooking techniques are used.  Avoid: Cream soups made with whole milk, cream, or high-fat cheese. All other soups. Desserts and Sweets  Allowed: Sherbet, fruit ices, gelatins, meringues, and angel food cake. Homemade desserts with recommended fats, oils, and milk products. Jam, jelly, honey, marmalade, sugars, and syrups. Pure sugar candy, such as gum drops, hard candy, jelly beans, marshmallows, mints, and small amounts of dark chocolate.  Avoid: Commercially prepared cakes, pies, cookies, frosting, pudding, or mixes for these products. Desserts containing whole milk products, chocolate, coconut, lard, palm oil, or palm kernel oil.  Ice cream or ice cream drinks. Candy that contains chocolate, coconut, butter, hydrogenated fat, or unknown ingredients. Buttered syrups. Fats and Oils  Allowed: Vegetable oils: safflower, sunflower, corn, soybean, cottonseed, sesame, canola, olive, or peanut. Non-hydrogenated margarines. Salad dressing or mayonnaise: homemade or commercial, made with a recommended oil. Low or nonfat salad dressing or mayonnaise.  Limit added fats and oils to 6 to 8 tsp per day (includes fats used in cooking, baking, salads, and spreads on bread). Remember to count the "hidden fats" in foods.  Avoid: Solid fats and shortenings: butter, lard, salt pork, bacon drippings.  Gravy containing meat fat, shortening, or suet. Cocoa butter, coconut. Coconut oil, palm oil, palm kernel oil, or hydrogenated oils: these ingredients are often used in bakery products, nondairy creamers, whipped toppings, candy, and commercially fried foods. Read labels carefully. Salad dressings made of unknown oils, sour cream, or cheese, such as blue cheese and Roquefort. Cream, all kinds: half-and-half, light, heavy, or whipping. Sour cream or cream cheese (even if "light" or low-fat). Nondairy cream substitutes: coffee creamers and sour cream substitutes made with palm, palm kernel, hydrogenated oils, or coconut oil. Beverages  Allowed: Coffee (regular or decaffeinated), tea. Diet carbonated beverages, mineral water. Alcohol: Check with your caregiver. Moderation is recommended.  Avoid: Whole milk, regular sodas, and juice drinks with added sugar. Condiments  Allowed: All seasonings and condiments. Cocoa powder. "Cream" sauces made with recommended ingredients.  Avoid: Carob powder made with hydrogenated fats. SAMPLE MENU Breakfast   cup orange juice   cup oatmeal  1 slice toast  1 tsp margarine  1 cup skim milk Lunch  [place] sandwich with 2 oz [place], 2 slices bread  Lettuce and tomato slices  Fresh fruit  Carrot  sticks  Coffee or tea Snack  Fresh fruit or low-fat crackers Dinner  3 oz lean ground beef  1 baked potato  1 tsp margarine   cup asparagus  Lettuce salad  1 tbs non-creamy dressing   cup peach slices  1 cup skim milk Document Released: 01/22/2008 Document Revised: 10/14/2011 Document Reviewed: 07/08/2011 [employer] Patient Information 2014 [place], [place].

## 2013-05-23 NOTE — Discharge Summary (Signed)
Physician Discharge Summary  MOSS BERRY IRC:[id number] DOB: 1960-07-26 DOA: 05/22/2013  PCP: Odette Fraction, MD  Admit date: 05/22/2013 Discharge date: 05/23/2013  Time spent: 35 minutes  Recommendations for Outpatient Follow-up:  1. Follow up with Cardiologist in 1 week.  Discharge Diagnoses:  Principal Problem:   Chest pain Active Problems:   Coronary artery disease   Dyslipidemia   Hypertension   Syncope and collapse   Atherosclerosis of native arteries of the extremities with intermittent claudication   Chest pain at rest   Discharge Condition: stable  Diet recommendation: heart healthy  Filed Weights   05/22/13 2040  Weight: 109.181 kg (240 lb 11.2 oz)    History of present illness:  53 year old male with history of coronary artery disease status post CABG in 2010, history of acute MI in 2011 (status post cardiac cath), hypertension, dyslipidemia, COPD, vascular disease status post bypass grafting who presented to the ED with symptoms of chest pain for past 2 days. Patient reports upper left-sided chest pain which is sharp in nature, pain lasting in density and occasionally radiating to his left arm lasting for about 30 seconds. Patient reports having several episodes of the chest pain symptoms since yesterday. He also reports having 3 syncopal event since yesterday (one episode yesterday and 2 episodes today.) He reports having warning sign of syncope and being diaphoretic. He slumped on the ground but did not hit his head or injure anywhere. He denies any seizure-like activity,, white, bowel or urinary incontinence or any weakness of his extremities. He denies similar symptoms in the past. He denies any recent travel or sick contacts. He denies any trauma to the chest or lifting any heavy weights.    Hospital Course:  Syncope and collapse: - Orthostatic negative. - Carotid doppler 1.26.2014: 1-39% ICA stenosis. Vertebral artery flow is antegrade - No events on  telemetry. - Holter monitor as an outpatient.  Chest pain: - Cardiac markers negative x 3. - CT Angio negative for  PE. - EKG SR normal axis - Myoview 1.26.2014: Negative for fixed or reversible ischemia. 2. Calculated ejection fraction of 53%. - Cont statins.  HTN: - No changes cont home meds.  Procedures:  myoview  Ct angio  Carotid doppler  Consultations:  Cardiology  Discharge Exam: Filed Vitals:   05/23/13 1448  BP: 131/75  Pulse: 60  Temp: 98.6 F (37 C)  Resp: 16    General: A&O x3 Cardiovascular: RRR Respiratory: good air movement CTA B/L  Discharge Instructions      Discharge Orders   Future Appointments Provider Department Dept Phone   11/24/2013 12:30 PM [facility] [phone]   11/24/2013 1:00 PM Elam Dutch, MD [facility] [phone]   Future Orders Complete By Expires   Diet - low sodium heart healthy  As directed    Increase activity slowly  As directed        Medication List         ADVAIR DISKUS 250-50 MCG/DOSE Aepb  Generic drug:  Fluticasone-Salmeterol  Take 2 puffs by mouth daily as needed (for trouble breathing).     aspirin EC 81 MG tablet  Take 81 mg by mouth every morning.     atorvastatin 80 MG tablet  Commonly known as:  LIPITOR  Take 80 mg by mouth daily.     clopidogrel 75 MG tablet  Commonly known as:  PLAVIX  Take 75 mg by mouth daily with breakfast.  hydrochlorothiazide 25 MG tablet  Commonly known as:  HYDRODIURIL  Take 25 mg by mouth daily.     losartan 100 MG tablet  Commonly known as:  COZAAR  Take 100 mg by mouth daily.     potassium chloride 10 MEQ tablet  Commonly known as:  K-DUR,KLOR-CON  Take 10 mEq by mouth daily.     tiotropium 18 MCG inhalation capsule  Commonly known as:  SPIRIVA  Place 1 capsule (18 mcg total) into inhaler and inhale daily.       Allergies  Allergen Reactions  . Crestor [Rosuvastatin]      Muscle aches.   Follow-up Information   Follow up with Darden Amber., MD In 1 week. (hospital follow up)    Specialty:  Cardiology   Contact information:   [place] [address] [phone]        The results of significant diagnostics from this hospitalization (including imaging, microbiology, ancillary and laboratory) are listed below for reference.    Significant Diagnostic Studies: Ct Head Wo Contrast  05/22/2013   CLINICAL DATA:  Syncopal episodes  EXAM: CT HEAD WITHOUT CONTRAST  TECHNIQUE: Contiguous axial images were obtained from the base of the skull through the vertex without contrast.  COMPARISON:  None  FINDINGS: Minor periventricular chronic microvascular ischemic changes bilaterally. No acute intracranial hemorrhage, definite infarction, mass lesion, midline shift, herniation, or hydrocephalus. No extra-axial fluid collection. Cisterns patent. No definite cerebellar abnormality. Atherosclerosis of the intracranial vessels. Mastoids and sinuses clear. Orbits unremarkable.  IMPRESSION: Chronic white matter microvascular changes. No other acute finding by noncontrast CT.   Electronically Signed   By: Daryll Brod M.D.   On: 05/22/2013 21:43   Ct Angio Chest Pe W/cm &/or Wo Cm  05/22/2013   CLINICAL DATA:  Shortness of breath.  Chest pain.  EXAM: CT ANGIOGRAPHY CHEST WITH CONTRAST  TECHNIQUE: Multidetector CT imaging of the chest was performed using the standard protocol during bolus administration of intravenous contrast. Multiplanar CT image reconstructions including MIPs were obtained to evaluate the vascular anatomy.  CONTRAST:  20mL OMNIPAQUE IOHEXOL 350 MG/ML SOLN  COMPARISON:  02/10/2011  FINDINGS: The lungs are well inflated without consolidation or effusion. The heart is normal in size. There is subtle calcification along the left anterior descending coronary artery. There is no evidence of pulmonary embolism. There is no significant  mediastinal, hilar or axillary adenopathy. There is minimal calcified plaque over the thoracic aorta. Sternotomy wires and mediastinal surgical clips are present.  Limited images through the upper abdomen are unremarkable. Remainder the exam is unchanged.  Review of the MIP images confirms the above findings.  IMPRESSION: No acute cardiopulmonary disease and no evidence of pulmonary embolism.  Very minimal atherosclerotic coronary artery disease.   Electronically Signed   By: Marin Olp M.D.   On: 05/22/2013 17:17   Nm Myocar Multi W/spect W/wall Motion / Ef  05/23/2013   CLINICAL DATA:  Chest pain.  EXAM: MYOCARDIAL IMAGING WITH SPECT (REST AND PHARMACOLOGIC-STRESS)  GATED LEFT VENTRICULAR WALL MOTION STUDY  LEFT VENTRICULAR EJECTION FRACTION  TECHNIQUE: Standard myocardial SPECT imaging was performed after resting intravenous injection of 10 mCi Tc-67m sestamibi. Subsequently, intravenous infusion of Lexiscan was performed under the supervision of the Cardiology staff. At peak effect of the drug, 30 mCi Tc-33m sestamibi was injected intravenously and standard myocardial SPECT imaging was performed. Quantitative gated imaging was also performed to evaluate left ventricular wall motion, and estimate left ventricular ejection  fraction.  COMPARISON:  CT ANGIO CHEST W/CM &/OR WO/CM dated 05/22/2013; DG CHEST 2 VIEW dated 07/06/2012  FINDINGS: There is mild left ventricular dilation. The calculated ejection fraction is 53%. End-diastolic volume is [id number] mL with end systolic volume of 59 mL. There is no fixed or reversible ischemia identified. Wall motion and thickening is normal.  IMPRESSION: 1. Negative for fixed or reversible ischemia. 2. Calculated ejection fraction of 53%.   Electronically Signed   By: Dereck Ligas M.D.   On: 05/23/2013 13:57    Microbiology: No results found for this or any previous visit (from the past 240 hour(s)).   Labs: Basic Metabolic Panel:  Recent Labs Lab 05/22/13 1332  NA  143  K 3.5*  CL 105  CO2 21  GLUCOSE 129*  BUN 9  CREATININE 1.15  CALCIUM 8.7   Liver Function Tests: No results found for this basename: AST, ALT, ALKPHOS, BILITOT, PROT, ALBUMIN,  in the last 168 hours No results found for this basename: LIPASE, AMYLASE,  in the last 168 hours No results found for this basename: AMMONIA,  in the last 168 hours CBC:  Recent Labs Lab 05/22/13 1332  WBC 6.5  HGB 13.4  HCT 40.3  MCV 84.8  PLT 171   Cardiac Enzymes:  Recent Labs Lab 05/22/13 2355 05/23/13 0415  TROPONINI <0.30 <0.30   BNP: BNP (last 3 results)  Recent Labs  07/06/12 1051  PROBNP 87.0   CBG: No results found for this basename: GLUCAP,  in the last 168 hours     Signed:  Charlynne Cousins  [facility] 05/23/2013, 4:49 PM

## 2013-05-23 NOTE — Progress Notes (Signed)
VASCULAR LAB PRELIMINARY  PRELIMINARY  PRELIMINARY  PRELIMINARY  Carotid duplex completed.    Preliminary report:  Bilateral:  1-39% ICA stenosis.  Vertebral artery flow is antegrade.     Shirl Ludington, RVS 05/23/2013, 12:48 PM

## 2013-05-24 ENCOUNTER — Ambulatory Visit (INDEPENDENT_AMBULATORY_CARE_PROVIDER_SITE_OTHER): Payer: Medicaid Other | Admitting: Family Medicine

## 2013-05-24 ENCOUNTER — Encounter: Payer: Self-pay | Admitting: Family Medicine

## 2013-05-24 VITALS — HR 108 | Temp 99.8°F | Resp 18 | Ht 68.0 in | Wt 240.0 lb

## 2013-05-24 DIAGNOSIS — J111 Influenza due to unidentified influenza virus with other respiratory manifestations: Secondary | ICD-10-CM | POA: Insufficient documentation

## 2013-05-24 DIAGNOSIS — R509 Fever, unspecified: Secondary | ICD-10-CM

## 2013-05-24 DIAGNOSIS — J441 Chronic obstructive pulmonary disease with (acute) exacerbation: Secondary | ICD-10-CM

## 2013-05-24 DIAGNOSIS — J449 Chronic obstructive pulmonary disease, unspecified: Secondary | ICD-10-CM | POA: Insufficient documentation

## 2013-05-24 LAB — INFLUENZA A AND B
Inflenza A Ag: POSITIVE — AB
Influenza B Ag: NEGATIVE

## 2013-05-24 MED ORDER — GUAIFENESIN-CODEINE 100-10 MG/5ML PO SOLN: 10.0000 mL | Freq: Four times a day (QID) | ORAL | Status: DC | PRN

## 2013-05-24 MED ORDER — ALBUTEROL SULFATE HFA 108 (90 BASE) MCG/ACT IN AERS: 2.0000 | INHALATION_SPRAY | RESPIRATORY_TRACT | Status: DC | PRN

## 2013-05-24 MED ORDER — PREDNISONE 10 MG PO TABS
ORAL_TABLET | ORAL | Status: DC
Start: 2013-05-24 — End: 2013-08-24

## 2013-05-24 MED ORDER — AZITHROMYCIN 250 MG PO TABS: ORAL_TABLET | ORAL | Status: DC

## 2013-05-24 MED ORDER — OSELTAMIVIR PHOSPHATE 75 MG PO CAPS: 75.0000 mg | ORAL_CAPSULE | Freq: Two times a day (BID) | ORAL | Status: DC
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
## 2013-05-24 NOTE — Patient Instructions (Signed)
I am treating flu as well COPD/Bronchitis Take antibiotic / prednisone  ( start tomorrow) and cough medicine Use the albuterol every 4 hours as needed for shortness of breath Take [place] for the flu virus F/U Friday for a recheck

## 2013-05-24 NOTE — Assessment & Plan Note (Signed)
I'm concerned about influenza as well as COPD exacerbation as he has deteriorated was recently hospitalized I will cover him with azithromycin he will also be given prednisone burst which he will start tomorrow after IM injection today. Cough medicine also gIVEN

## 2013-05-24 NOTE — Progress Notes (Signed)
   Subjective:    Patient ID: [name], male    DOB: [birth year], 53 y.o.   MRN: [id number]  HPI  Patient here with cough shortness of breath chest tightness fever for the past 2 weeks. He actually was recently discharged from the hospital when he presented with chest pain and presyncopal event. He told them that he had chest tightness and cough as well however based on the discharge summary he was admitted for chest pain rule out he did have negative CT angiogram to chest his labs are fairly unremarkable. He was not prescribed anything for his COPD and was not tested for influenza. He states didn't feel it left the hospital 2 days ago he has worsened where he is only able to walk short distances without wheezing and becoming short of breath. He does have Advair and Spiriva at home but no rescue inhaler. Ct head neg, cardiac r/o neg Review of Systems  GEN-+ fatigue,+ fever, weight loss,weakness, recent illness, +body aches HEENT- denies eye drainage, change in vision, nasal discharge, CVS- denies chest pain, palpitations RESP- + SOB, +cough,+ wheeze ABD- denies N/V, change in stools, abd pain Neuro- + headache, dizziness, syncope, seizure activity      Objective:   Physical Exam  GEN- NAD, alert and oriented x3, ill appearing HEENT- PERRL, EOMI, non injected sclera, pink conjunctiva, MMM, oropharynx clear, TM clear bilat no effusion, no maxillary sinus tenderness, nares clear  Neck- Supple, shotty LAD CVS- MILD Tachycardia, no murmur RESP-course BS, no wheeze,normal WOB, normal oxygen sat  ABD-NABS,soft,NT,ND EXT- No edema Pulses- Radial 2+  +flu       Assessment & Plan:

## 2013-05-27 ENCOUNTER — Ambulatory Visit (INDEPENDENT_AMBULATORY_CARE_PROVIDER_SITE_OTHER): Payer: Medicaid Other | Admitting: Family Medicine

## 2013-05-27 ENCOUNTER — Encounter: Payer: Self-pay | Admitting: Family Medicine

## 2013-05-27 VITALS — BP 110/80 | HR 78 | Temp 97.6°F | Resp 18 | Ht 68.0 in | Wt 239.0 lb

## 2013-05-27 DIAGNOSIS — J441 Chronic obstructive pulmonary disease with (acute) exacerbation: Secondary | ICD-10-CM

## 2013-05-27 DIAGNOSIS — J111 Influenza due to unidentified influenza virus with other respiratory manifestations: Secondary | ICD-10-CM

## 2013-05-27 MED ORDER — GUAIFENESIN-CODEINE 100-10 MG/5ML PO SOLN: 10.0000 mL | Freq: Four times a day (QID) | ORAL | Status: DC | PRN

## 2013-05-27 NOTE — Assessment & Plan Note (Signed)
Improved, continue current meds 

## 2013-05-27 NOTE — Progress Notes (Signed)
   Subjective:    Patient ID: [name], male    DOB: February 22, 1961, 53 y.o.   MRN: [id number]  HPI Patient here to followup influenza as well as COPD exacerbation. He states that he feels much better than he continues to have some wheezing cough. He's still on his prednisone taper and will complete his antibiotics tomorrow. He is able to take deep breaths and is not short of breath with exertion now. He request a refill on the cough medication   Review of Systems - per above  GEN- denies fatigue, fever, weight loss,weakness, recent illness HEENT- denies eye drainage, change in vision, nasal discharge, CVS- denies chest pain, palpitations RESP- denies SOB, +cough,+ wheeze        Objective:   Physical Exam   GEN- NAD, alert and oriented x3,  HEENT- PERRL, EOMI, non injected sclera, pink conjunctiva, MMM, oropharynx clear, CVS- RRR, no murmur RESP-course BS, few scattered wheeze,normal WOB,  Pulses- Radial 2+       Assessment & Plan:

## 2013-05-27 NOTE — Patient Instructions (Signed)
Complete medications Cough syrup refilled F/U as needed

## 2013-05-27 NOTE — Assessment & Plan Note (Signed)
Complete Tamiflu he is improving

## 2013-06-01 ENCOUNTER — Other Ambulatory Visit: Payer: Self-pay | Admitting: Cardiovascular Disease

## 2013-06-01 ENCOUNTER — Other Ambulatory Visit: Payer: Self-pay | Admitting: Physician Assistant

## 2013-06-01 NOTE — Telephone Encounter (Signed)
Medication refilled per protocol. 

## 2013-07-01 ENCOUNTER — Other Ambulatory Visit: Payer: Self-pay | Admitting: Cardiovascular Disease

## 2013-07-28 ENCOUNTER — Other Ambulatory Visit: Payer: Self-pay | Admitting: Cardiovascular Disease

## 2013-08-24 ENCOUNTER — Encounter: Payer: Self-pay | Admitting: Cardiovascular Disease

## 2013-08-24 ENCOUNTER — Ambulatory Visit (INDEPENDENT_AMBULATORY_CARE_PROVIDER_SITE_OTHER): Payer: Medicaid Other | Admitting: Cardiovascular Disease

## 2013-08-24 VITALS — BP 138/94 | HR 64 | Ht 68.0 in | Wt 249.0 lb

## 2013-08-24 DIAGNOSIS — I2510 Atherosclerotic heart disease of native coronary artery without angina pectoris: Secondary | ICD-10-CM

## 2013-08-24 NOTE — Progress Notes (Signed)
[name] Date of Birth  [birth year] [place] [address]    [place]   [place], [place]  [address]    [place], [place]  [address] [phone]  Fax  [phone]  [phone]  Fax [phone]  Problem list: 1. Coronary artery disease: He status post CABG in October , 2010. He status post inferior wall myocardial infarction in January, 2011 with multiple stents to the native right coronary artery Dr. Burt Knack 2. Peripheral vascular disease-status post iliac stenting 3. Dyslipidemia 4.. Hypertension  History of Present Illness:  [name] is a 53 yo with CAD and PVD.  He has had problems with his left foot and has not been able to exercise.  He continues to have lots of sternal wound pain whenever he moves his arm or lifts anything heavy. 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   [name] has had some muscle aches which resolved when he stopped his Crestor.  He has hx of severe CAD and PVD.    He has had a nagging left ankle injury that keeps him from walking much.  May 12 ,2014: He continues to have occasional CP and frequent episodes of dyspnea. He has been told that he has COPD.   He is still in a left leg splint.  August 24, 2013:  [name] was admitted to the hospital in January with chest discomfort. A Lexiscan  Myoview study was negative for ischemia. His left systolic function is normal. He's feeling a bit better.  He has been in a boot for a chronic left ankle injury for the past several years.    Current Outpatient Prescriptions on File Prior to Visit  Medication Sig Dispense Refill  . ADVAIR DISKUS 250-50 MCG/DOSE AEPB Take 2 puffs by mouth daily as needed (for trouble breathing).       Marland Kitchen albuterol (PROVENTIL HFA;VENTOLIN HFA) 108 (90 BASE) MCG/ACT inhaler Inhale 2 puffs into the lungs every 4 (four) hours as needed for wheezing or shortness of  breath.  1 Inhaler  1  . aspirin EC 81 MG tablet Take 81 mg by mouth every morning.      Marland Kitchen atorvastatin (LIPITOR) 80 MG tablet Take 80 mg by mouth daily.      . clopidogrel (PLAVIX) 75 MG tablet Take 75 mg by mouth daily with breakfast.      . hydrochlorothiazide (HYDRODIURIL) 25 MG tablet TAKE 1 TABLET BY MOUTH EVERY MORNING  30 tablet  5  . losartan (COZAAR) 100 MG tablet Take 100 mg by mouth daily.      . potassium chloride (K-DUR,KLOR-CON) 10 MEQ tablet TAKE 1 TABLET BY MOUTH EVERY MORNING- MUST MAKE APPOINTMENT  15 tablet  0  . tiotropium (SPIRIVA) 18 MCG inhalation capsule Place 1 capsule (18 mcg total) into inhaler and inhale daily.  30 capsule  5   No current facility-administered medications on file prior to visit.    Allergies  Allergen Reactions  . Crestor [Rosuvastatin]     Muscle aches.    Past Medical History  Diagnosis Date  . Coronary artery disease     STATUS POST CABG 01/2009  . 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 Dyslipidemia   . Hypertension   . Dyspnea   . Orthostatic hypotension   . Syncope and collapse   . Chest pain   . Myocardial  infarction   . COPD (chronic obstructive pulmonary disease)   . Hyperlipidemia     Past Surgical History  Procedure Laterality Date  . Cardiac catheterization  10/19/2009  . Ankle surgery      x 2  . Sternal wires removal  07/08/2011    Procedure: STERNAL WIRES REMOVAL;  Surgeon: Grace Isaac, MD;  Location: [facility];  Service: Open Heart Surgery;  Laterality: N/A;  removal of 4th wire  . Pr vein bypass graft,aorto-fem-pop    . Coronary artery bypass graft  2010  . Colonoscopy  April 2013  . Foot surgery    . Kidney stimulator    . Hernia repair  01/23/12    umb hernia    History  Smoking status  . Former Smoker -- 3.00 packs/day for 30 years  . Types: Cigarettes, Cigars  . Quit date: 01/26/2009  Smokeless tobacco  . Never Used    History  Alcohol Use No    Family History  Problem Relation Age of Onset  .  Coronary artery disease Father   . Diabetes Father   . Hyperlipidemia Father   . Heart attack Father   . Heart disease Father     before age 6  . Hypertension Father   . Cancer Mother     uterine  . Hyperlipidemia Mother   . Diabetes Mother   . Hypertension Mother   . Hypertension Brother     Reviw of Systems:  Reviewed in the HPI.  All other systems are negative.  Physical Exam: Blood pressure 138/94, pulse 64, height 5\' 8"  (1.727 m), weight 249 lb (112.946 kg). General: Well developed, well nourished, in no acute distress.  Head: Normocephalic, atraumatic, sclera non-icteric, mucus membranes are moist,   Neck: Supple. Negative for carotid bruits. JVD not elevated.  Lungs: Clear bilaterally to auscultation without wheezes, rales, or rhonchi. Breathing is unlabored.  Heart: regular rate with S1 S2. No murmurs, rubs, or gallops appreciated.  Abdomen: Soft, non-tender, non-distended with normoactive bowel sounds. No hepatomegaly. No rebound/guarding. No obvious abdominal masses.  Msk:  Strength and tone appear normal for age.  Extremities: No clubbing or cyanosis. No edema.  Distal pedal pulses are 2+    Neuro: Alert and oriented X 3. Moves all extremities spontaneously.  Psych:  Responds to questions appropriately with a normal affect.  ECG: Sep 06, 2012:  Sinus brady at 75.  No ST or T wave changes.  Assessment / Plan:

## 2013-08-24 NOTE — Assessment & Plan Note (Signed)
Presents today for followup of his episodes of chest pain. He has a history of coronary artery disease and has had bypass grafting as well if multiple stents.  He's not been following the diet. He still eats lots of fat fattening foods and fried foods. His last LDL was 122. He is intolerant to Crestor and  does not want to take any other meds.  Will continue with his atorvastatin really recorded on improved diet. He also needs to exercise. We'll see him in 6 months. We'll check fasting lipids, liver enzymes, and basic metabolic profile at that time.

## 2013-08-24 NOTE — Patient Instructions (Signed)
Your physician recommends that you continue on your current medications as directed. Please refer to the Current Medication list given to you today.  Your physician wants you to follow-up in: 6 months with Dr. Acie Fredrickson and fasting lab work.  You will receive a reminder letter in the mail two months in advance. If you don't receive a letter, please call our office to schedule the follow-up appointment.  Your physician recommends that you return for lab work in: 6 months when you see Dr. Acie Fredrickson - do not eat or drink after midnight except water in preparation for this appointment

## 2013-09-05 ENCOUNTER — Other Ambulatory Visit: Payer: Self-pay | Admitting: Cardiovascular Disease

## 2013-09-09 ENCOUNTER — Other Ambulatory Visit: Payer: Self-pay | Admitting: Cardiovascular Disease

## 2013-11-21 ENCOUNTER — Other Ambulatory Visit: Payer: Self-pay | Admitting: Cardiovascular Disease

## 2013-11-21 ENCOUNTER — Other Ambulatory Visit: Payer: Self-pay | Admitting: Family Medicine

## 2013-11-21 NOTE — Telephone Encounter (Signed)
Refill appropriate and filled per protocol. 

## 2013-11-24 ENCOUNTER — Ambulatory Visit: Payer: Self-pay | Admitting: Vascular Surgery

## 2013-11-24 ENCOUNTER — Encounter (HOSPITAL_COMMUNITY): Payer: Self-pay

## 2013-12-14 ENCOUNTER — Encounter: Payer: Self-pay | Admitting: Vascular Surgery

## 2013-12-15 ENCOUNTER — Ambulatory Visit (INDEPENDENT_AMBULATORY_CARE_PROVIDER_SITE_OTHER): Payer: Medicaid Other | Admitting: Vascular Surgery

## 2013-12-15 ENCOUNTER — Encounter: Payer: Self-pay | Admitting: Vascular Surgery

## 2013-12-15 ENCOUNTER — Ambulatory Visit (HOSPITAL_COMMUNITY)
Admission: RE | Admit: 2013-12-15 | Discharge: 2013-12-15 | Disposition: A | Discharge status: Home / Self Care | Payer: Medicaid Other | Source: Ambulatory Visit | Attending: Vascular Surgery | Admitting: Vascular Surgery

## 2013-12-15 VITALS — BP 129/87 | HR 62 | Temp 97.8°F | Resp 16 | Ht 69.0 in | Wt 250.0 lb

## 2013-12-15 DIAGNOSIS — I252 Old myocardial infarction: Secondary | ICD-10-CM | POA: Insufficient documentation

## 2013-12-15 DIAGNOSIS — Z7982 Long term (current) use of aspirin: Secondary | ICD-10-CM | POA: Insufficient documentation

## 2013-12-15 DIAGNOSIS — I739 Peripheral vascular disease, unspecified: Secondary | ICD-10-CM

## 2013-12-15 DIAGNOSIS — J449 Chronic obstructive pulmonary disease, unspecified: Secondary | ICD-10-CM | POA: Insufficient documentation

## 2013-12-15 DIAGNOSIS — Z79899 Other long term (current) drug therapy: Secondary | ICD-10-CM | POA: Insufficient documentation

## 2013-12-15 DIAGNOSIS — Z951 Presence of aortocoronary bypass graft: Secondary | ICD-10-CM | POA: Insufficient documentation

## 2013-12-15 DIAGNOSIS — Z7902 Long term (current) use of antithrombotics/antiplatelets: Secondary | ICD-10-CM | POA: Insufficient documentation

## 2013-12-15 DIAGNOSIS — E785 Hyperlipidemia, unspecified: Secondary | ICD-10-CM | POA: Insufficient documentation

## 2013-12-15 DIAGNOSIS — M79609 Pain in unspecified limb: Secondary | ICD-10-CM

## 2013-12-15 DIAGNOSIS — Z87891 Personal history of nicotine dependence: Secondary | ICD-10-CM | POA: Insufficient documentation

## 2013-12-15 DIAGNOSIS — I10 Essential (primary) hypertension: Secondary | ICD-10-CM | POA: Insufficient documentation

## 2013-12-15 DIAGNOSIS — J4489 Other specified chronic obstructive pulmonary disease: Secondary | ICD-10-CM | POA: Insufficient documentation

## 2013-12-15 DIAGNOSIS — I2510 Atherosclerotic heart disease of native coronary artery without angina pectoris: Secondary | ICD-10-CM | POA: Insufficient documentation

## 2013-12-15 MED ORDER — GABAPENTIN 400 MG PO CAPS: 800.0000 mg | ORAL_CAPSULE | Freq: Two times a day (BID) | ORAL | Status: DC

## 2013-12-15 MED ORDER — GABAPENTIN 300 MG PO CAPS: 600.0000 mg | ORAL_CAPSULE | Freq: Two times a day (BID) | ORAL | Status: DC

## 2013-12-15 NOTE — Progress Notes (Signed)
[facility] [place] HISTORY AND PHYSICAL   CC: Follow up bilateral leg pain.  Susy Frizzle, MD  HPI: This is a 53 y.o. male with bilateral lower extremity leg pain who is here for one year follow-up. He has had previous bilateral common iliac stents. At his last visit, he had normal ABIs and his pain was attributed to spinal stenosis. He has been prescribed gabapentin by his PCP and reports significant improvement in his leg pain. He is a former smoker quitting in 2010.   Past Medical History  Diagnosis Date  . Coronary artery disease     STATUS POST CABG 01/2009  . 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 Dyslipidemia   . Hypertension   . Dyspnea   . Orthostatic hypotension   . Syncope and collapse   . Chest pain   . Myocardial infarction   . COPD (chronic obstructive pulmonary disease)   . Hyperlipidemia    Past Surgical History  Procedure Laterality Date  . Cardiac catheterization  10/19/2009  . Ankle surgery      x 2  . Sternal wires removal  07/08/2011    Procedure: STERNAL WIRES REMOVAL;  Surgeon: Grace Isaac, MD;  Location: [facility];  Service: Open Heart Surgery;  Laterality: N/A;  removal of 4th wire  . Pr vein bypass graft,aorto-fem-pop    . Coronary artery bypass graft  2010  . Colonoscopy  April 2013  . Foot surgery    . Kidney stimulator    . Hernia repair  01/23/12    umb hernia    Allergies  Allergen Reactions  . Crestor [Rosuvastatin]     Muscle aches.    Current Outpatient Prescriptions  Medication Sig Dispense Refill  . ADVAIR DISKUS 250-50 MCG/DOSE AEPB Take 2 puffs by mouth daily as needed (for trouble breathing).       Marland Kitchen albuterol (PROVENTIL HFA;VENTOLIN HFA) 108 (90 BASE) MCG/ACT inhaler Inhale 2 puffs into the lungs every 4 (four) hours as needed for wheezing or shortness of breath.  1 Inhaler  1  . aspirin EC 81 MG tablet Take 81 mg by mouth every morning.      Marland Kitchen atorvastatin (LIPITOR) 80 MG tablet TAKE 1 TABLET BY MOUTH  EVERY NIGHT AT BEDTIME  30 tablet  3  . clopidogrel (PLAVIX) 75 MG tablet TAKE 1 TABLET BY MOUTH EVERY MORNING  30 tablet  3  . gabapentin (NEURONTIN) 400 MG capsule Take 2 capsules (800 mg total) by mouth 2 (two) times daily.  30 capsule  6  . hydrochlorothiazide (HYDRODIURIL) 25 MG tablet TAKE 1 TABLET BY MOUTH EVERY MORNING  30 tablet  5  . losartan (COZAAR) 100 MG tablet TAKE 1 TABLET BY MOUTH EVERY MORNING  30 tablet  2  . potassium chloride (K-DUR,KLOR-CON) 10 MEQ tablet Take 1 tablet (10 mEq total) by mouth daily.  30 tablet  3  . tiotropium (SPIRIVA) 18 MCG inhalation capsule Place 1 capsule (18 mcg total) into inhaler and inhale daily.  30 capsule  5   No current facility-administered medications for this visit.    Family History  Problem Relation Age of Onset  . Coronary artery disease Father   . Diabetes Father   . Hyperlipidemia Father   . Heart attack Father   . Heart disease Father     before age 22  . Hypertension Father   . Cancer Mother     uterine  . Hyperlipidemia Mother   .  Diabetes Mother   . Hypertension Mother   . Hypertension Brother     History   Social History  . Marital Status: Married    Spouse Name: N/A    Number of Children: N/A  . Years of Education: N/A   Occupational History  . Not on file.   Social History Main Topics  . Smoking status: Former Smoker -- 3.00 packs/day for 30 years    Types: Cigarettes, Cigars    Quit date: 01/26/2009  . Smokeless tobacco: Never Used  . Alcohol Use: No  . Drug Use: No  . Sexual Activity: Not Currently   Other Topics Concern  . Not on file   Social History Narrative  . No narrative on file     ROS: [x]  Positive   [ ]  Negative   [ ]  All sytems reviewed and are negative  Cardiovascular: []  chest pain/pressure []  palpitations []  SOB lying flat []  DOE [x]  pain in legs while walking []  pain in feet when lying flat []  hx of DVT []  hx of phlebitis []  swelling in legs []  varicose  veins  Pulmonary: []  productive cough []  asthma []  wheezing  Neurologic: []  weakness in []  arms []  legs []  numbness in []  arms []  legs [] difficulty speaking or slurred speech []  temporary loss of vision in one eye []  dizziness  Hematologic: []  bleeding problems []  problems with blood clotting easily  GI []  vomiting blood []  blood in stool  GU: []  burning with urination []  blood in urine  Psychiatric: []  hx of major depression  Integumentary: []  rashes []  ulcers  Constitutional: []  fever []  chills   PHYSICAL EXAMINATION:  Filed Vitals:   12/15/13 1507  BP: 129/87  Pulse: 62  Temp: 97.8 F (36.6 C)  Resp: 16   Body mass index is 36.9 kg/(m^2).  General:  WDWN in NAD Cardiac: RRR, without  Murmurs, rubs or gallops; without carotid bruits Pulmonary: clear to auscultation bilaterally  Skin: without rashes, without ulcers  Vascular Exam/Pulses: palpable 2+ radial, femoral, dorsalis pedis and posterior tibialis pulses bilaterally Extremities: without ischemic changes, without Gangrene  Musculoskeletal: no muscle wasting or atrophy  Neurologic: A&O X 3; Appropriate Affect ; SENSATION: normal; MOTOR FUNCTION:  moving all extremities equally. Speech is fluent/normal    ASSESSMENT: 53 y.o. male with bilateral lower extremity pain with likely neurologic etiology  PLAN: Normal ABIs today. R 1.18; L 1.15 with triphasic waveforms bilaterally and palpable peripheral pulses bilaterally. He has been previously prescribed gabapentin by his PCP and noted relief of leg pain. Refilled his prescription today: 800 mg BID, 6 Refills. He will follow up in one year with repeat ABIs.    Virgina Jock, PA-C [facility] [phone]  Clinic MD:  Pt seen and examined in conjunction with Dr. Oneida Alar.   History and exam because as above. Patient is asymptomatic and has no claudication currently. He has normal ABIs and palpable pulses. He will followup in one  year.  Ruta Hinds, MD [facility] [place] Office: [phone] Pager: [phone]

## 2013-12-20 ENCOUNTER — Other Ambulatory Visit: Payer: Self-pay | Admitting: Family Medicine

## 2013-12-20 ENCOUNTER — Other Ambulatory Visit: Payer: Self-pay

## 2013-12-20 DIAGNOSIS — M79606 Pain in leg, unspecified: Secondary | ICD-10-CM

## 2013-12-20 MED ORDER — GABAPENTIN 400 MG PO CAPS: 800.0000 mg | ORAL_CAPSULE | Freq: Two times a day (BID) | ORAL | Status: DC

## 2013-12-20 NOTE — Progress Notes (Signed)
Pt. Called to request Rx for Gabapentin be reviewed, as quantity initially prescribed was only enough for 7 days.  Noted that the Rx was written for Gabapentin 400 mg, 2 capsules BID; #30.  Advised pt. will send new Rx to pharmacy for a qty of 120, since the dose is 2 caps BID.  Agreed with plan.

## 2013-12-20 NOTE — Telephone Encounter (Signed)
Refill appropriate and filled per protocol. 

## 2014-01-12 ENCOUNTER — Ambulatory Visit (INDEPENDENT_AMBULATORY_CARE_PROVIDER_SITE_OTHER): Payer: Medicaid Other | Admitting: *Deleted

## 2014-01-12 DIAGNOSIS — Z23 Encounter for immunization: Secondary | ICD-10-CM

## 2014-01-16 ENCOUNTER — Ambulatory Visit (INDEPENDENT_AMBULATORY_CARE_PROVIDER_SITE_OTHER): Payer: Medicaid Other | Admitting: Family Medicine

## 2014-01-16 ENCOUNTER — Encounter: Payer: Self-pay | Admitting: Family Medicine

## 2014-01-16 NOTE — Progress Notes (Signed)
Subjective:    Patient ID: [name], male    DOB: 1960/10/15, 53 y.o.   MRN: [id number]  HPI Patient has gained 11 pounds since January. He states that he is unable to exercise due to peripheral vascular disease, as well as nerve damage in his legs. He is currently taking gabapentin 800 mg by mouth 3 times a day which does help with the pain in his legs. He does not eat excessively although he is not counting his calories. He is not exercising on any regular basis. Past Medical History  Diagnosis Date  . Coronary artery disease     STATUS POST CABG 01/2009  . 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 Dyslipidemia   . Hypertension   . Dyspnea   . Orthostatic hypotension   . Syncope and collapse   . Chest pain   . Myocardial infarction   . COPD (chronic obstructive pulmonary disease)   . Hyperlipidemia    Past Surgical History  Procedure Laterality Date  . Cardiac catheterization  10/19/2009  . Ankle surgery      x 2  . Sternal wires removal  07/08/2011    Procedure: STERNAL WIRES REMOVAL;  Surgeon: Grace Isaac, MD;  Location: [facility];  Service: Open Heart Surgery;  Laterality: N/A;  removal of 4th wire  . Pr vein bypass graft,aorto-fem-pop    . Coronary artery bypass graft  2010  . Colonoscopy  April 2013  . Foot surgery    . Kidney stimulator    . Hernia repair  01/23/12    umb hernia   Current Outpatient Prescriptions on File Prior to Visit  Medication Sig Dispense Refill  . ADVAIR DISKUS 250-50 MCG/DOSE AEPB Take 2 puffs by mouth daily as needed (for trouble breathing).       Marland Kitchen albuterol (PROVENTIL HFA;VENTOLIN HFA) 108 (90 BASE) MCG/ACT inhaler Inhale 2 puffs into the lungs every 4 (four) hours as needed for wheezing or shortness of breath.  1 Inhaler  1  . aspirin EC 81 MG tablet Take 81 mg by mouth every morning.      Marland Kitchen atorvastatin (LIPITOR) 80 MG tablet TAKE 1 TABLET BY MOUTH EVERY NIGHT AT BEDTIME  30 tablet  3  . clopidogrel (PLAVIX) 75 MG tablet TAKE 1 TABLET BY  MOUTH EVERY MORNING  30 tablet  3  . gabapentin (NEURONTIN) 400 MG capsule Take 2 capsules (800 mg total) by mouth 2 (two) times daily.  120 capsule  6  . hydrochlorothiazide (HYDRODIURIL) 25 MG tablet TAKE 1 TABLET BY MOUTH EVERY MORNING  30 tablet  3  . losartan (COZAAR) 100 MG tablet TAKE 1 TABLET BY MOUTH EVERY MORNING  30 tablet  2  . potassium chloride (K-DUR,KLOR-CON) 10 MEQ tablet Take 1 tablet (10 mEq total) by mouth daily.  30 tablet  3  . tiotropium (SPIRIVA) 18 MCG inhalation capsule Place 1 capsule (18 mcg total) into inhaler and inhale daily.  30 capsule  5   No current facility-administered medications on file prior to visit.   Allergies  Allergen Reactions  . Crestor [Rosuvastatin]     Muscle aches.   History   Social History  . Marital Status: Married    Spouse Name: N/A    Number of Children: N/A  . Years of Education: N/A   Occupational History  . Not on file.   Social History Main Topics  . Smoking status: Former Smoker -- 3.00 packs/day for 30 years  Types: Cigarettes, Cigars    Quit date: 01/26/2009  . Smokeless tobacco: Never Used  . Alcohol Use: No  . Drug Use: No  . Sexual Activity: Not Currently   Other Topics Concern  . Not on file   Social History Narrative  . No narrative on file      Review of Systems  All other systems reviewed and are negative.      Objective:   Physical Exam  Vitals reviewed. Constitutional: He appears well-developed and well-nourished.  Cardiovascular: Normal rate, regular rhythm and normal heart sounds.   Pulmonary/Chest: Effort normal and breath sounds normal. No respiratory distress. He has no wheezes. He has no rales.  Psychiatric: He has a normal mood and affect. His behavior is normal. Judgment and thought content normal.          Assessment & Plan:  Morbid obesity   We discussed different options for weight loss. I recommended 30 minutes of aerobic exercise 5 days a week. Also recommend  restricting his caloric intake to 400-500 mg per meal. He also discussed medicines for loss. Right the patient to discontinue gabapentin and begin Trokendi xr 25 mg poqday and titrate 25 mg each week until he is on 100 mg poqday.  Recheck in 1 month.  Over this with his neuropathic pain can also facilitate some weight loss.

## 2014-01-19 ENCOUNTER — Other Ambulatory Visit: Payer: Self-pay | Admitting: Cardiovascular Disease

## 2014-02-06 ENCOUNTER — Telehealth: Payer: Self-pay | Admitting: Family Medicine

## 2014-02-06 MED ORDER — TOPIRAMATE ER 100 MG PO CAP24: 100.0000 mg | ORAL_CAPSULE | Freq: Every day | ORAL | Status: DC

## 2014-02-06 NOTE — Telephone Encounter (Signed)
Do you know what medication he is talking about?

## 2014-02-06 NOTE — Telephone Encounter (Signed)
I gave him trokendi xr 100 mg poqday

## 2014-02-06 NOTE — Telephone Encounter (Signed)
Phone number in previous note is incorrect. Called pt and he is aware of med sent to pharm and verified dosage.

## 2014-02-06 NOTE — Telephone Encounter (Signed)
Patient is calling to let you know that the medication that dr pickard gave him samples of to lessen his appetite is working and would like to know if we can call in an rx for this if possible  Please call him back at [phone] and pharmacy is [facility] is [place] ( he did not know that name of this. Sorry.)

## 2014-02-09 ENCOUNTER — Telehealth: Payer: Self-pay | Admitting: *Deleted

## 2014-02-09 NOTE — Telephone Encounter (Signed)
Received fax requesting PA for Trokendi.   PA submitted.

## 2014-02-10 ENCOUNTER — Telehealth: Payer: Self-pay | Admitting: Family Medicine

## 2014-02-10 NOTE — Telephone Encounter (Signed)
Error  PA already being worked on by another provider

## 2014-02-13 NOTE — Telephone Encounter (Signed)
Call placed to [facility] to inquire about PA.   Advised that PA was not noted on chart. Refiled over phone.   Also advised that Topiramate does not require PA, but patient would have to take more often as it is not ER.   MD to be made aware.

## 2014-02-13 NOTE — Telephone Encounter (Signed)
If not approved, I would switch to topamax 50 bid.

## 2014-02-20 ENCOUNTER — Ambulatory Visit: Payer: Self-pay | Admitting: Cardiovascular Disease

## 2014-02-21 ENCOUNTER — Encounter: Payer: Self-pay | Admitting: Cardiovascular Disease

## 2014-02-21 ENCOUNTER — Telehealth: Payer: Self-pay | Admitting: Family Medicine

## 2014-02-21 ENCOUNTER — Ambulatory Visit (INDEPENDENT_AMBULATORY_CARE_PROVIDER_SITE_OTHER): Payer: Medicare Other | Admitting: Cardiovascular Disease

## 2014-02-21 ENCOUNTER — Other Ambulatory Visit (INDEPENDENT_AMBULATORY_CARE_PROVIDER_SITE_OTHER): Payer: Medicare Other

## 2014-02-21 VITALS — BP 130/94 | HR 66 | Ht 69.0 in | Wt 248.8 lb

## 2014-02-21 DIAGNOSIS — E785 Hyperlipidemia, unspecified: Secondary | ICD-10-CM

## 2014-02-21 DIAGNOSIS — I10 Essential (primary) hypertension: Secondary | ICD-10-CM

## 2014-02-21 DIAGNOSIS — I2510 Atherosclerotic heart disease of native coronary artery without angina pectoris: Secondary | ICD-10-CM

## 2014-02-21 LAB — LIPID PANEL
Cholesterol: 181 mg/dL (ref 0–200)
HDL: 29.9 mg/dL — ABNORMAL LOW (ref >39.00)
LDL Cholesterol: 130 mg/dL — ABNORMAL HIGH (ref 0–99)
NonHDL: 151.1
Total CHOL/HDL Ratio: 6
Triglycerides: 104 mg/dL (ref 0.0–149.0)
VLDL: 20.8 mg/dL (ref 0.0–40.0)

## 2014-02-21 LAB — BASIC METABOLIC PANEL
BUN: 12 mg/dL (ref 6–23)
CO2: 25 mEq/L (ref 19–32)
Calcium: 9 mg/dL (ref 8.4–10.5)
Chloride: 107 mEq/L (ref 96–112)
Creatinine, Ser: 1.2 mg/dL (ref 0.4–1.5)
GFR: 65.18 mL/min (ref >60.00)
Glucose, Bld: 116 mg/dL — ABNORMAL HIGH (ref 70–99)
Potassium: 3.6 mEq/L (ref 3.5–5.1)
Sodium: 140 mEq/L (ref 135–145)

## 2014-02-21 LAB — HEPATIC FUNCTION PANEL
ALT: 24 U/L (ref 0–53)
AST: 29 U/L (ref 0–37)
Albumin: 3.6 g/dL (ref 3.5–5.2)
Alkaline Phosphatase: 102 U/L (ref 39–117)
Bilirubin, Direct: 0.2 mg/dL (ref 0.0–0.3)
Total Bilirubin: 1.8 mg/dL — ABNORMAL HIGH (ref 0.2–1.2)
Total Protein: 7.5 g/dL (ref 6.0–8.3)

## 2014-02-21 MED ORDER — CLOPIDOGREL BISULFATE 75 MG PO TABS: 75.0000 mg | ORAL_TABLET | Freq: Every day | ORAL | Status: DC

## 2014-02-21 MED ORDER — TOPIRAMATE 50 MG PO TABS: 50.0000 mg | ORAL_TABLET | Freq: Two times a day (BID) | ORAL | Status: DC

## 2014-02-21 NOTE — Patient Instructions (Signed)
Your physician recommends that you continue on your current medications as directed. Please refer to the Current Medication list given to you today.  REDUCE HIGH SODIUM FOODS LIKE CANNED SOUP, GRAVY, SAUCES, READY PREPARED FOODS LIKE FROZEN FOODS; LEAN CUISINE, LASAGNA. BACON, SAUSAGE, LUNCH MEAT, FAST FOODS, HOT DOGS, CHIPS, PIZZA.   Your physician wants you to follow-up in: 6 months with Dr. Acie Fredrickson.  You will receive a reminder letter in the mail two months in advance. If you don't receive a letter, please call our office to schedule the follow-up appointment.

## 2014-02-21 NOTE — Telephone Encounter (Signed)
Patient is calling to say that the medicaid will not cover his appetite suppressant pill would like to know if we can call in something else for him  [phone]

## 2014-02-21 NOTE — Telephone Encounter (Signed)
Can we switch to topamax 50 mg pobid

## 2014-02-21 NOTE — Telephone Encounter (Signed)
Received PA determination.   PA denied.   New prescription sent to pharmacy for Topamax 50mg  PO BID.

## 2014-02-21 NOTE — Progress Notes (Signed)
[name] Date of Birth  18-Apr-1961 [facility] [address]    [address]   [place], [place]  [address]    [place], [place]  [address] [phone]  Fax  [phone]  [phone]  Fax [phone]  Problem list: 1. Coronary artery disease: He status post CABG in October , 2010. He status post inferior wall myocardial infarction in January, 2011 with multiple stents to the native right coronary artery Dr. Burt Knack 2. Peripheral vascular disease-status post iliac stenting 3. Dyslipidemia 4.. Hypertension  History of Present Illness:  [name] is a 53 yo with CAD and PVD.  He has had problems with his left foot and has not been able to exercise.  He continues to have lots of sternal wound pain whenever he moves his arm or lifts anything heavy. 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   [name] has had some muscle aches which resolved when he stopped his Crestor.  He has hx of severe CAD and PVD.    He has had a nagging left ankle injury that keeps him from walking much.  May 12 ,2014: He continues to have occasional CP and frequent episodes of dyspnea. He has been told that he has COPD.   He is still in a left leg splint.  August 24, 2013:  [name] was admitted to the hospital in January with chest discomfort. A Lexiscan  Myoview study was negative for ischemia. His left systolic function is normal. He's feeling a bit better.  He has been in a boot for a chronic left ankle injury for the past several years.   Oct. 27, 2015:  Doing well. Has been eating some salty foods.    Current Outpatient Prescriptions on File Prior to Visit  Medication Sig Dispense Refill  . ADVAIR DISKUS 250-50 MCG/DOSE AEPB Take 2 puffs by mouth daily as needed (for trouble breathing).       Marland Kitchen albuterol (PROVENTIL HFA;VENTOLIN HFA) 108 (90 BASE) MCG/ACT inhaler Inhale 2 puffs into the  lungs every 4 (four) hours as needed for wheezing or shortness of breath.  1 Inhaler  1  . aspirin EC 81 MG tablet Take 81 mg by mouth every morning.      Marland Kitchen atorvastatin (LIPITOR) 80 MG tablet TAKE 1 TABLET BY MOUTH EVERY NIGHT AT BEDTIME  30 tablet  3  . clopidogrel (PLAVIX) 75 MG tablet TAKE 1 TABLET BY MOUTH EVERY MORNING  30 tablet  1  . gabapentin (NEURONTIN) 400 MG capsule Take 2 capsules (800 mg total) by mouth 2 (two) times daily.  120 capsule  6  . hydrochlorothiazide (HYDRODIURIL) 25 MG tablet TAKE 1 TABLET BY MOUTH EVERY MORNING  30 tablet  3  . losartan (COZAAR) 100 MG tablet TAKE 1 TABLET BY MOUTH EVERY MORNING  30 tablet  2  . potassium chloride (K-DUR,KLOR-CON) 10 MEQ tablet TAKE 1 TABLET BY MOUTH EVERY DAY  30 tablet  0  . tiotropium (SPIRIVA) 18 MCG inhalation capsule Place 1 capsule (18 mcg total) into inhaler and inhale daily.  30 capsule  5  . topiramate (TOPAMAX) 50 MG tablet Take 1 tablet (50 mg total) by mouth 2 (two) times daily.  60 tablet  3   No current facility-administered medications on file prior to visit.    Allergies  Allergen Reactions  . Crestor [Rosuvastatin]  Muscle aches.    Past Medical History  Diagnosis Date  . Coronary artery disease     STATUS POST CABG 01/2009  . 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 Dyslipidemia   . Hypertension   . Dyspnea   . Orthostatic hypotension   . Syncope and collapse   . Chest pain   . Myocardial infarction   . COPD (chronic obstructive pulmonary disease)   . Hyperlipidemia     Past Surgical History  Procedure Laterality Date  . Cardiac catheterization  10/19/2009  . Ankle surgery      x 2  . Sternal wires removal  07/08/2011    Procedure: STERNAL WIRES REMOVAL;  Surgeon: Grace Isaac, MD;  Location: [facility];  Service: Open Heart Surgery;  Laterality: N/A;  removal of 4th wire  . Pr vein bypass graft,aorto-fem-pop    . Coronary artery bypass graft  2010  . Colonoscopy  April 2013  . Foot surgery    .  Kidney stimulator    . Hernia repair  01/23/12    umb hernia    History  Smoking status  . Former Smoker -- 3.00 packs/day for 30 years  . Types: Cigarettes, Cigars  . Quit date: 01/26/2009  Smokeless tobacco  . Never Used    History  Alcohol Use No    Family History  Problem Relation Age of Onset  . Coronary artery disease Father   . Diabetes Father   . Hyperlipidemia Father   . Heart attack Father   . Heart disease Father     before age 43  . Hypertension Father   . Cancer Mother     uterine  . Hyperlipidemia Mother   . Diabetes Mother   . Hypertension Mother   . Hypertension Brother     Reviw of Systems:  Reviewed in the HPI.  All other systems are negative.  Physical Exam: Blood pressure 130/94, pulse 66, height 5\' 9"  (1.753 m), weight 248 lb 12.8 oz (112.855 kg). General: Well developed, well nourished, in no acute distress.  Head: Normocephalic, atraumatic, sclera non-icteric, mucus membranes are moist,   Neck: Supple. Negative for carotid bruits. JVD not elevated.  Lungs: Clear bilaterally to auscultation without wheezes, rales, or rhonchi. Breathing is unlabored.  Heart: regular rate with S1 S2. No murmurs, rubs, or gallops appreciated.  Abdomen: Soft, non-tender, non-distended with normoactive bowel sounds. No hepatomegaly. No rebound/guarding. No obvious abdominal masses.  Msk:  Strength and tone appear normal for age.  Extremities: No clubbing or cyanosis. No edema.  Distal pedal pulses are 2+    Neuro: Alert and oriented X 3. Moves all extremities spontaneously.  Psych:  Responds to questions appropriately with a normal affect.  ECG: Sep 06, 2012:  Sinus brady at 56.  No ST or T wave changes.  Assessment / Plan:

## 2014-02-21 NOTE — Assessment & Plan Note (Signed)
He admits to eating more salt than he should.  His diastolic BP is elevated  I've recommneded that he should decrease his salt intake .  He is already on HCTZ.

## 2014-02-21 NOTE — Telephone Encounter (Signed)
Med sent to pharm and pt aware 

## 2014-02-21 NOTE — Assessment & Plan Note (Signed)
[name]  is doing well. He has not had any episodes of chest pain or shortness of breath. Continue same medications.

## 2014-02-21 NOTE — Assessment & Plan Note (Addendum)
He had fasting lipids, liver enzymes, and basic profile checked today. Continue atorvastatin 80 mg a day.

## 2014-03-21 ENCOUNTER — Other Ambulatory Visit: Payer: Self-pay | Admitting: Cardiovascular Disease

## 2014-04-06 ENCOUNTER — Encounter (HOSPITAL_COMMUNITY): Payer: Self-pay | Admitting: Cardiovascular Disease

## 2014-05-22 ENCOUNTER — Other Ambulatory Visit: Payer: Self-pay | Admitting: Family Medicine

## 2014-05-22 ENCOUNTER — Encounter: Payer: Self-pay | Admitting: Family Medicine

## 2014-06-19 ENCOUNTER — Encounter: Payer: Self-pay | Admitting: Family Medicine

## 2014-06-19 ENCOUNTER — Ambulatory Visit (INDEPENDENT_AMBULATORY_CARE_PROVIDER_SITE_OTHER): Payer: PPO | Admitting: Family Medicine

## 2014-06-19 VITALS — BP 86/56 | HR 68 | Temp 98.0°F | Resp 18 | Ht 69.0 in | Wt 252.0 lb

## 2014-06-19 DIAGNOSIS — E669 Obesity, unspecified: Secondary | ICD-10-CM

## 2014-06-19 DIAGNOSIS — E785 Hyperlipidemia, unspecified: Secondary | ICD-10-CM

## 2014-06-19 LAB — COMPLETE METABOLIC PANEL WITH GFR
ALT: 19 U/L (ref 0–53)
AST: 21 U/L (ref 0–37)
Albumin: 4.1 g/dL (ref 3.5–5.2)
Alkaline Phosphatase: 94 U/L (ref 39–117)
BUN: 20 mg/dL (ref 6–23)
CO2: 23 mEq/L (ref 19–32)
Calcium: 9.2 mg/dL (ref 8.4–10.5)
Chloride: 106 mEq/L (ref 96–112)
Creat: 1.37 mg/dL — ABNORMAL HIGH (ref 0.50–1.35)
GFR, Est African American: 69 mL/min
GFR, Est Non African American: 59 mL/min — ABNORMAL LOW
Glucose, Bld: 99 mg/dL (ref 70–99)
Potassium: 3.7 mEq/L (ref 3.5–5.3)
Sodium: 142 mEq/L (ref 135–145)
Total Bilirubin: 1.3 mg/dL — ABNORMAL HIGH (ref 0.2–1.2)
Total Protein: 6.7 g/dL (ref 6.0–8.3)

## 2014-06-19 LAB — LIPID PANEL
Cholesterol: 181 mg/dL (ref 0–200)
HDL: 32 mg/dL — ABNORMAL LOW (ref >=40)
LDL Cholesterol: 123 mg/dL — ABNORMAL HIGH (ref 0–99)
Total CHOL/HDL Ratio: 5.7 Ratio
Triglycerides: 131 mg/dL (ref <150)
VLDL: 26 mg/dL (ref 0–40)

## 2014-06-19 MED ORDER — NALTREXONE-BUPROPION HCL ER 8-90 MG PO TB12: 2.0000 | ORAL_TABLET | Freq: Two times a day (BID) | ORAL | Status: DC

## 2014-06-19 NOTE — Progress Notes (Signed)
Subjective:    Patient ID: [name], male    DOB: October 08, 1960, 54 y.o.   MRN: [id number]  HPI  I saw the patient in September of last year and we tried him on Topamax for weight loss. The Topamax has not been successful. The patient has actually gained 2 pounds. Furthermore the Topamax has not helped with any of his neuropathic pain. He would like to discontinue this medication and try something different. He has a significant past medical history of coronary artery disease, myocardial infarction, and hyperlipidemia. He is overdue for fasting lab work. Given his cardiac history I do not believe that Adipex is a safe alternative for the patient. Past Medical History  Diagnosis Date  . Coronary artery disease     STATUS POST CABG 01/2009  . 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 Dyslipidemia   . Hypertension   . Dyspnea   . Orthostatic hypotension   . Syncope and collapse   . Chest pain   . Myocardial infarction   . COPD (chronic obstructive pulmonary disease)   . Hyperlipidemia    Past Surgical History  Procedure Laterality Date  . Cardiac catheterization  10/19/2009  . Ankle surgery      x 2  . Sternal wires removal  07/08/2011    Procedure: STERNAL WIRES REMOVAL;  Surgeon: Grace Isaac, MD;  Location: [facility];  Service: Open Heart Surgery;  Laterality: N/A;  removal of 4th wire  . Pr vein bypass graft,aorto-fem-pop    . Coronary artery bypass graft  2010  . Colonoscopy  April 2013  . Foot surgery    . Kidney stimulator    . Hernia repair  01/23/12    umb hernia  . Left heart catheterization with coronary/graft angiogram N/A 07/08/2012    Procedure: LEFT HEART CATHETERIZATION WITH Beatrix Fetters;  Surgeon: Thayer Headings, MD;  Location[facility];  Service: Cardiovascular;  Laterality: N/A;   Current Outpatient Prescriptions on File Prior to Visit  Medication Sig Dispense Refill  . ADVAIR DISKUS 250-50 MCG/DOSE AEPB Take 2 puffs by mouth daily as needed (for  trouble breathing).     Marland Kitchen albuterol (PROVENTIL HFA;VENTOLIN HFA) 108 (90 BASE) MCG/ACT inhaler Inhale 2 puffs into the lungs every 4 (four) hours as needed for wheezing or shortness of breath. 1 Inhaler 1  . aspirin EC 81 MG tablet Take 81 mg by mouth every morning.    Marland Kitchen atorvastatin (LIPITOR) 80 MG tablet TAKE 1 TABLET BY MOUTH EVERY NIGHT AT BEDTIME 30 tablet 3  . clopidogrel (PLAVIX) 75 MG tablet Take 1 tablet (75 mg total) by mouth daily. 90 tablet 3  . gabapentin (NEURONTIN) 400 MG capsule Take 2 capsules (800 mg total) by mouth 2 (two) times daily. 120 capsule 6  . hydrochlorothiazide (HYDRODIURIL) 25 MG tablet TAKE 1 TABLET BY MOUTH EVERY MORNING 30 tablet 3  . losartan (COZAAR) 100 MG tablet TAKE 1 TABLET BY MOUTH EVERY DAY IN THE MORNING 30 tablet 5  . potassium chloride (K-DUR,KLOR-CON) 10 MEQ tablet TAKE 1 TABLET BY MOUTH EVERY DAY 30 tablet 5  . tiotropium (SPIRIVA) 18 MCG inhalation capsule Place 1 capsule (18 mcg total) into inhaler and inhale daily. 30 capsule 5  . topiramate (TOPAMAX) 50 MG tablet Take 1 tablet (50 mg total) by mouth 2 (two) times daily. 60 tablet 3   No current facility-administered medications on file prior to visit.   Allergies  Allergen Reactions  . Crestor [Rosuvastatin]  Muscle aches.   History   Social History  . Marital Status: Married    Spouse Name: N/A  . Number of Children: N/A  . Years of Education: N/A   Occupational History  . Not on file.   Social History Main Topics  . Smoking status: Former Smoker -- 3.00 packs/day for 30 years    Types: Cigarettes, Cigars    Quit date: 01/26/2009  . Smokeless tobacco: Never Used  . Alcohol Use: No  . Drug Use: No  . Sexual Activity: Not Currently   Other Topics Concern  . Not on file   Social History Narrative     Review of Systems  All other systems reviewed and are negative.      Objective:   Physical Exam  Constitutional: He appears well-developed and well-nourished.    Cardiovascular: Normal rate and regular rhythm.  Exam reveals no gallop and no friction rub.   No murmur heard. Pulmonary/Chest: Effort normal and breath sounds normal. No respiratory distress. He has no wheezes. He has no rales.  Abdominal: Soft. Bowel sounds are normal. He exhibits no distension. There is no tenderness. There is no rebound.  Musculoskeletal: He exhibits no edema.  Vitals reviewed.         Assessment & Plan:  Obesity - Plan: Naltrexone-Bupropion HCl ER 8-90 MG TB12  HLD (hyperlipidemia) - Plan: COMPLETE METABOLIC PANEL WITH GFR, Lipid panel  We will try the patient on contrave 2 tab pobid.  He will gradually wean up on the dose until he achieves this dose over a period of 4 weeks. I will also check a CMP and a fasting lipid panel. Goal LDL cholesterol is less than 70. Marland Kitchen

## 2014-06-22 ENCOUNTER — Telehealth: Payer: Self-pay | Admitting: Family Medicine

## 2014-06-22 DIAGNOSIS — I10 Essential (primary) hypertension: Secondary | ICD-10-CM

## 2014-06-22 DIAGNOSIS — E785 Hyperlipidemia, unspecified: Secondary | ICD-10-CM

## 2014-06-22 DIAGNOSIS — Z79899 Other long term (current) drug therapy: Secondary | ICD-10-CM

## 2014-06-22 MED ORDER — EZETIMIBE 10 MG PO TABS: 10.0000 mg | ORAL_TABLET | Freq: Every day | ORAL | Status: DC

## 2014-06-22 NOTE — Telephone Encounter (Signed)
Patient is calling about his lab results  [phone]

## 2014-06-22 NOTE — Telephone Encounter (Signed)
Patient aware of results and Zetia sent to pharm

## 2014-06-28 ENCOUNTER — Encounter: Payer: Self-pay | Admitting: Family Medicine

## 2014-06-28 ENCOUNTER — Ambulatory Visit (INDEPENDENT_AMBULATORY_CARE_PROVIDER_SITE_OTHER): Payer: PPO | Admitting: Family Medicine

## 2014-06-28 VITALS — BP 136/82 | HR 76 | Temp 98.3°F | Resp 14 | Ht 68.0 in | Wt 244.0 lb

## 2014-06-28 DIAGNOSIS — T887XXA Unspecified adverse effect of drug or medicament, initial encounter: Secondary | ICD-10-CM | POA: Insufficient documentation

## 2014-06-28 DIAGNOSIS — I10 Essential (primary) hypertension: Secondary | ICD-10-CM | POA: Diagnosis not present

## 2014-06-28 DIAGNOSIS — T50905A Adverse effect of unspecified drugs, medicaments and biological substances, initial encounter: Secondary | ICD-10-CM

## 2014-06-28 NOTE — Assessment & Plan Note (Signed)
I think all of his symptoms including the anorexia is due to the high dose of the weight loss pill that he started taking. 5 that he is busy titrate up this was noted in his PCPs note. I am going to decrease Contrave to 1 tablet daily he will do this for one week then increase to one tablet twice a day if he has good weight loss at this amount he can stay there is not no titrate up prior to scheduled that I write it for him. He does want to continue with the medication as it does work for him as far as weight loss. Another possibility could be stopping the Topamax which she was at 50 mg twice a day abruptly to

## 2014-06-28 NOTE — Progress Notes (Signed)
Patient ID: [name], male   DOB: Oct 28, 1960, 54 y.o.   MRN: [id number]   Subjective:    Patient ID: [name], male    DOB: [birth year], 54 y.o.   MRN: [id number]  Patient presents for Low BP and N/V  patient here with side effects from medication. He was started on contrary for weight loss on the 22nd it seems that he did not understand the instructions and he started taking 2 tablets twice a day he had nausea and upset stomach he could not sleep yet headaches he also abruptly stopped his Topamax before starting it. He came in today after reading the side effects from the medication. He has lost 8 pounds in the past 10 days. He also notes that his blood pressure fluctuates a lot. He has very high blood pressures that he has very low blood pressures. He feels dizzy headed with his medication. He states that he stopped taking all of his medicine 2 weeks ago. He states at our office will often take him off of the medicine but his cardiologist put him back on it. He denies any chest pain or shortness of breath.    Review Of Systems:  GEN- denies fatigue, fever, weight loss,weakness, recent illness HEENT- denies eye drainage, change in vision, nasal discharge, CVS- denies chest pain, palpitations RESP- denies SOB, cough, wheeze ABD- + N/V, change in stools, abd pain GU- denies dysuria, hematuria, dribbling, incontinence MSK- denies joint pain, muscle aches, injury Neuro- +headache, dizziness, syncope, seizure activity       Objective:    BP 136/82 mmHg  Pulse 76  Temp(Src) 98.3 F (36.8 C) (Oral)  Resp 14  Ht 5\' 8"  (1.727 m)  Wt 244 lb (110.678 kg)  BMI 37.11 kg/m2 GEN- NAD, alert and oriented x3 HEENT- PERRL, EOMI, non injected sclera, pink conjunctiva, MMM, oropharynx clear CVS- RRR, no murmur RESP-CTAB ABD-NABS,soft,NT,ND EXT- No edema Pulses- Radial, 2+ Neuro-CNII-XII intact        Assessment & Plan:      Problem List Items Addressed This Visit    None       Note: This dictation was prepared with Dragon dictation along with smaller phrase technology. Any transcriptional errors that result from this process are unintentional.

## 2014-06-28 NOTE — Assessment & Plan Note (Signed)
No sign of hypotension today. I reviewed his vital signs he is only having one time when he came in and his blood pressure was actually significantly low. I advised him to take only 50 mg of losartan and to discontinue the hydrochlorothiazide. We are getting an earlier follow-up point with his cardiologist to assess the lability of his blood pressures in the setting of his coronary artery disease

## 2014-06-28 NOTE — Patient Instructions (Addendum)
For the Contrave- Take 1 tablet in the morning for 1 week, then increase to 1 tablet twice a day for 2 weeks at least, if you have good weight loss, then stay at this dose , If needed increase to 2 in morning and 1 at night for 2 weeks, then 2 in morning and 2 in the evening  For your blood pressure, take only 1/2 tablet of losartan  We will call with earlier heart doctor appointment. F/U as previous

## 2014-06-29 ENCOUNTER — Telehealth: Payer: Self-pay | Admitting: *Deleted

## 2014-06-29 NOTE — Telephone Encounter (Signed)
Received request from pharmacy for PA on Contrave.   PA submitted.   Dx: E66.9- obesity- BMI 37.2

## 2014-07-03 NOTE — Telephone Encounter (Signed)
PA approved 06/30/14 - 04/28/15 - will fax to [facility]

## 2014-07-07 ENCOUNTER — Encounter: Payer: Self-pay | Admitting: Cardiovascular Disease

## 2014-07-07 ENCOUNTER — Ambulatory Visit (INDEPENDENT_AMBULATORY_CARE_PROVIDER_SITE_OTHER): Payer: PPO | Admitting: Cardiovascular Disease

## 2014-07-07 VITALS — BP 120/90 | HR 50 | Ht 68.0 in | Wt 244.0 lb

## 2014-07-07 DIAGNOSIS — R079 Chest pain, unspecified: Secondary | ICD-10-CM

## 2014-07-07 DIAGNOSIS — I2510 Atherosclerotic heart disease of native coronary artery without angina pectoris: Secondary | ICD-10-CM

## 2014-07-07 NOTE — Patient Instructions (Signed)
Your physician recommends that you continue on your current medications as directed. Please refer to the Current Medication list given to you today.  Your physician wants you to follow-up in: 6 months with Dr. Nahser.  You will receive a reminder letter in the mail two months in advance. If you don't receive a letter, please call our office to schedule the follow-up appointment.  

## 2014-07-07 NOTE — Progress Notes (Signed)
Cardiology Office Note   Date:  07/07/2014   ID:  [name], DOB November 09, 1960, MRN [id number]  PCP:  Odette Fraction, MD  Cardiologist:   Thayer Headings, MD   Chief Complaint  Patient presents with  . Hypertension   1. Coronary artery disease: He status post CABG in October , 2010. He status post inferior wall myocardial infarction in January, 2011 with multiple stents to the native right coronary artery Dr. Burt Knack 2. Peripheral vascular disease-status post iliac stenting 3. Dyslipidemia 4.. Hypertension  History of Present Illness:  [name] is a 54 yo with CAD and PVD. He has had problems with his left foot and has not been able to exercise. He continues to have lots of sternal wound pain whenever he moves his arm or lifts anything heavy. 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   Baer has had some muscle aches which resolved when he stopped his Crestor. He has hx of severe CAD and PVD. He has had a nagging left ankle injury that keeps him from walking much.  May 12 ,2014: He continues to have occasional CP and frequent episodes of dyspnea. He has been told that he has COPD. He is still in a left leg splint.  August 24, 2013:  [name] was admitted to the hospital in January with chest discomfort. A Lexiscan Myoview study was negative for ischemia. His left systolic function is normal. He's feeling a bit better. He has been in a boot for a chronic left ankle injury for the past several years.    07/07/2014   [name] is a 54 y.o. male who presents for HTN Has been sticking to his diet.  Walks every day.  Has lost 8-9 lbs over the past several weeks.  He is very lightheaded when he first gets up in the AM.  Has been getting worse over the past month or so.  Also has frequent episodes of orthostatic hypotension.  BP has been low on occasion. Today's BP is normal  - he is off his BP  meds.  Avoids salty foods.  No CP or dyspnea. Was told to stay off his BP meds until he saw me. He is feeling better off the BP meds  j    Past Medical History  Diagnosis Date  . Coronary artery disease     STATUS POST CABG 01/2009  . 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 Dyslipidemia   . Hypertension   . Dyspnea   . Orthostatic hypotension   . Syncope and collapse   . Chest pain   . Myocardial infarction   . COPD (chronic obstructive pulmonary disease)   . Hyperlipidemia     Past Surgical History  Procedure Laterality Date  . Cardiac catheterization  10/19/2009  . Ankle surgery      x 2  . Sternal wires removal  07/08/2011    Procedure: STERNAL WIRES REMOVAL;  Surgeon: Grace Isaac, MD;  Location: [facility];  Service: Open Heart Surgery;  Laterality: N/A;  removal of 4th wire  . Pr vein bypass graft,aorto-fem-pop    . Coronary artery bypass graft  2010  . Colonoscopy  April 2013  . Foot surgery    . Kidney stimulator    . Hernia repair  01/23/12    umb hernia  . Left heart catheterization with coronary/graft angiogram N/A 07/08/2012    Procedure: LEFT HEART  CATHETERIZATION WITH [name];  Surgeon: Thayer Headings, MD;  Location: [facility] CATH LAB;  Service: Cardiovascular;  Laterality: N/A;     Current Outpatient Prescriptions  Medication Sig Dispense Refill  . ADVAIR DISKUS 250-50 MCG/DOSE AEPB Take 2 puffs by mouth daily as needed (for trouble breathing).     Marland Kitchen albuterol (PROVENTIL HFA;VENTOLIN HFA) 108 (90 BASE) MCG/ACT inhaler Inhale 2 puffs into the lungs every 4 (four) hours as needed for wheezing or shortness of breath. 1 Inhaler 1  . aspirin EC 81 MG tablet Take 81 mg by mouth every morning.    Marland Kitchen atorvastatin (LIPITOR) 80 MG tablet TAKE 1 TABLET BY MOUTH EVERY NIGHT AT BEDTIME 30 tablet 3  . clopidogrel (PLAVIX) 75 MG tablet Take 1 tablet (75 mg total) by mouth daily. 90 tablet 3  . ezetimibe (ZETIA) 10 MG tablet Take 1 tablet (10 mg total) by mouth  daily. 90 tablet 3  . gabapentin (NEURONTIN) 400 MG capsule Take 2 capsules (800 mg total) by mouth 2 (two) times daily. 120 capsule 6  . Naltrexone-Bupropion HCl ER 8-90 MG TB12 Take 2 tablets by mouth 2 (two) times daily. 120 tablet 3  . potassium chloride (K-DUR,KLOR-CON) 10 MEQ tablet TAKE 1 TABLET BY MOUTH EVERY DAY 30 tablet 5  . tiotropium (SPIRIVA) 18 MCG inhalation capsule Place 1 capsule (18 mcg total) into inhaler and inhale daily. 30 capsule 5  . hydrochlorothiazide (HYDRODIURIL) 25 MG tablet TAKE 1 TABLET BY MOUTH EVERY MORNING (Patient not taking: Reported on 07/07/2014) 30 tablet 3  . losartan (COZAAR) 100 MG tablet TAKE 1 TABLET BY MOUTH EVERY DAY IN THE MORNING (Patient not taking: Reported on 07/07/2014) 30 tablet 5   No current facility-administered medications for this visit.    Allergies:   Crestor    Social History:  The patient  reports that he quit smoking about 5 years ago. His smoking use included Cigarettes and Cigars. He has a 90 pack-year smoking history. He has never used smokeless tobacco. He reports that he does not drink alcohol or use illicit drugs.   Family History:  The patient's family history includes Cancer in his mother; Coronary artery disease in his father; Diabetes in his father and mother; Heart attack in his father; Heart disease in his father; Hyperlipidemia in his father and mother; Hypertension in his brother, father, and mother.    ROS:  Please see the history of present illness.    Review of Systems: Constitutional:  denies fever, chills, diaphoresis, appetite change and fatigue.  He's been on a diet and has been purposely losing weight.   HEENT: denies photophobia, eye pain, redness, hearing loss, ear pain, congestion, sore throat, rhinorrhea, sneezing, neck pain, neck stiffness and tinnitus.  Respiratory: admits to SOB on occasion   Cardiovascular: denies chest pain, palpitations and leg swelling.  Gastrointestinal: denies nausea, vomiting,  abdominal pain, diarrhea, constipation, blood in stool.  Genitourinary: denies dysuria, urgency, frequency, hematuria, flank pain and difficulty urinating.  Musculoskeletal: denies  myalgias, back pain, joint swelling, arthralgias and gait problem.   Skin: denies pallor, rash and wound.  Neurological: denies dizziness, seizures, syncope, weakness, light-headedness, numbness and headaches.   Hematological: denies adenopathy, easy bruising, personal or family bleeding history.  Psychiatric/ Behavioral: denies suicidal ideation, mood changes, confusion, nervousness, sleep disturbance and agitation.       All other systems are reviewed and negative.    PHYSICAL EXAM: VS:  BP 120/90 mmHg  Pulse 50  Ht 5\' 8"  (1.727  m)  Wt 244 lb (110.678 kg)  BMI 37.11 kg/m2 , BMI Body mass index is 37.11 kg/(m^2). GEN: Well nourished, well developed, in no acute distress HEENT: normal Neck: no JVD, carotid bruits, or masses Cardiac: RRR; no murmurs, rubs, or gallops,no edema  Respiratory:  clear to auscultation bilaterally, normal work of breathing GI: soft, nontender, nondistended, + BS MS: no deformity or atrophy Skin: warm and dry, no rash Neuro:  Strength and sensation are intact Psych: normal   EKG:  EKG is ordered today. The ekg ordered today demonstrates sinus bradycardia at 50.  No ST or T wave changes.    Recent Labs: 06/19/2014: ALT 19; BUN 20; Creatinine 1.37*; Potassium 3.7; Sodium 142    Lipid Panel    Component Value Date/Time   CHOL 181 06/19/2014 0901   TRIG 131 06/19/2014 0901   HDL 32* 06/19/2014 0901   CHOLHDL 5.7 06/19/2014 0901   VLDL 26 06/19/2014 0901   LDLCALC 123* 06/19/2014 0901      Wt Readings from Last 3 Encounters:  07/07/14 244 lb (110.678 kg)  06/28/14 244 lb (110.678 kg)  06/19/14 252 lb (114.306 kg)      Other studies Reviewed: Additional studies/ records that were reviewed today include: . Review of the above records demonstrates:     ASSESSMENT AND PLAN:  1. Coronary artery disease: He status post CABG in October , 2010. He status post inferior wall myocardial infarction in January, 2011 with multiple stents to the native right coronary artery Dr. Burt Knack,   He's not having any significant episodes of pain now.  2. Peripheral vascular disease-status post iliac stenting.  No episodes of claudication.  3. Dyslipidemia- lipids look ok  4.. Hypertension-  He is now having episodes of orthostatic hypotension.  He has greatly improved his diet and is walking every day.  His BP and symptoms of dizziness are much better .  He will stay off the BP meds for now.  Will see him again in  6 months and reassess.    Current medicines are reviewed at length with the patient today.  The patient does not have concerns regarding medicines.  The following changes have been made:  no change  Labs/ tests ordered today include:   Orders Placed This Encounter  Procedures  . Basic Metabolic Panel (BMET)  . Hepatic function panel  . Lipid Profile  . EKG 12-Lead     Disposition:   FU with me in 6 months    Signed, Arwen Haseley, Wonda Cheng, MD  07/07/2014 10:25 AM    [facility] [address], [place], [place]  [address] Phone: [phone]; Fax: [phone]

## 2014-08-22 ENCOUNTER — Ambulatory Visit: Payer: Self-pay | Admitting: Cardiovascular Disease

## 2014-09-07 ENCOUNTER — Other Ambulatory Visit: Payer: Self-pay | Admitting: *Deleted

## 2014-09-07 MED ORDER — HYDROCHLOROTHIAZIDE 25 MG PO TABS: 25.0000 mg | ORAL_TABLET | Freq: Every morning | ORAL | Status: DC

## 2014-09-07 NOTE — Telephone Encounter (Signed)
Received fax requesting refill on HCTZ.   Refill appropriate and filled per protocol. 

## 2014-11-02 ENCOUNTER — Other Ambulatory Visit: Payer: Self-pay | Admitting: Family Medicine

## 2014-11-02 ENCOUNTER — Encounter: Payer: Self-pay | Admitting: Family Medicine

## 2014-11-02 NOTE — Telephone Encounter (Signed)
Medication refill for one time only.  Patient needs to be seen.  Letter sent for patient to call and schedule 

## 2014-12-21 ENCOUNTER — Encounter (HOSPITAL_COMMUNITY): Payer: Self-pay

## 2014-12-21 ENCOUNTER — Ambulatory Visit: Payer: Self-pay | Admitting: Vascular Surgery

## 2014-12-21 ENCOUNTER — Other Ambulatory Visit (HOSPITAL_COMMUNITY): Payer: Self-pay

## 2015-01-02 ENCOUNTER — Other Ambulatory Visit: Payer: Self-pay | Admitting: *Deleted

## 2015-01-02 DIAGNOSIS — I739 Peripheral vascular disease, unspecified: Secondary | ICD-10-CM

## 2015-01-02 DIAGNOSIS — Z9862 Peripheral vascular angioplasty status: Secondary | ICD-10-CM

## 2015-01-09 ENCOUNTER — Encounter: Payer: Self-pay | Admitting: Vascular Surgery

## 2015-01-11 ENCOUNTER — Inpatient Hospital Stay (HOSPITAL_COMMUNITY): Admission: RE | Admit: 2015-01-11 | Payer: Self-pay | Source: Ambulatory Visit

## 2015-01-11 ENCOUNTER — Encounter (HOSPITAL_COMMUNITY): Payer: Self-pay

## 2015-01-11 ENCOUNTER — Ambulatory Visit: Payer: Self-pay | Admitting: Vascular Surgery

## 2015-02-16 ENCOUNTER — Telehealth: Payer: Self-pay | Admitting: *Deleted

## 2015-02-16 ENCOUNTER — Other Ambulatory Visit: Payer: Self-pay | Admitting: *Deleted

## 2015-02-16 MED ORDER — CLOPIDOGREL BISULFATE 75 MG PO TABS: 75.0000 mg | ORAL_TABLET | Freq: Every day | ORAL | Status: DC

## 2015-02-16 MED ORDER — LOSARTAN POTASSIUM 100 MG PO TABS: ORAL_TABLET | ORAL | Status: DC

## 2015-02-16 MED ORDER — POTASSIUM CHLORIDE CRYS ER 10 MEQ PO TBCR: 10.0000 meq | EXTENDED_RELEASE_TABLET | Freq: Every day | ORAL | Status: DC

## 2015-02-16 NOTE — Telephone Encounter (Signed)
[facility] requests a refill on losartan 100mg  qd for the patient. Per last office visit, patient reported that he was not taking this medication. I called patient and he reports that at his podiatry visit recently his bp was a little elevated over 140, so he felt that he needed to restart the medication. He stated that he has some on hand already restarted. Ok to give rx? Please advise. Thanks, MI

## 2015-02-16 NOTE — Telephone Encounter (Signed)
Ok to restart losartan 100 a day Check BMP in 3 weeks

## 2015-02-19 NOTE — Telephone Encounter (Signed)
Spoke with patient who is due for fasting lab work and 6 month follow-up with Dr. Acie Fredrickson.  I scheduled patient for 11/17 with Dr. Acie Fredrickson and he will come fasting for lab work.  Patient thanked me for the call.

## 2015-03-06 ENCOUNTER — Ambulatory Visit: Payer: PPO

## 2015-03-13 ENCOUNTER — Other Ambulatory Visit: Payer: Self-pay | Admitting: Family Medicine

## 2015-03-15 ENCOUNTER — Ambulatory Visit: Payer: PPO | Admitting: Cardiovascular Disease

## 2015-03-19 ENCOUNTER — Ambulatory Visit (INDEPENDENT_AMBULATORY_CARE_PROVIDER_SITE_OTHER): Payer: PPO | Admitting: Cardiovascular Disease

## 2015-03-19 ENCOUNTER — Encounter: Payer: Self-pay | Admitting: Cardiovascular Disease

## 2015-03-19 ENCOUNTER — Other Ambulatory Visit (INDEPENDENT_AMBULATORY_CARE_PROVIDER_SITE_OTHER): Payer: PPO | Admitting: *Deleted

## 2015-03-19 VITALS — BP 140/96 | HR 60 | Ht 68.0 in | Wt 242.8 lb

## 2015-03-19 DIAGNOSIS — I10 Essential (primary) hypertension: Secondary | ICD-10-CM | POA: Diagnosis not present

## 2015-03-19 DIAGNOSIS — E785 Hyperlipidemia, unspecified: Secondary | ICD-10-CM | POA: Diagnosis not present

## 2015-03-19 DIAGNOSIS — I2510 Atherosclerotic heart disease of native coronary artery without angina pectoris: Secondary | ICD-10-CM | POA: Diagnosis not present

## 2015-03-19 DIAGNOSIS — I25810 Atherosclerosis of coronary artery bypass graft(s) without angina pectoris: Secondary | ICD-10-CM

## 2015-03-19 LAB — HEPATIC FUNCTION PANEL
ALT: 18 U/L (ref 9–46)
AST: 21 U/L (ref 10–35)
Albumin: 4.3 g/dL (ref 3.6–5.1)
Alkaline Phosphatase: 93 U/L (ref 40–115)
Bilirubin, Direct: 0.2 mg/dL (ref <=0.2)
Indirect Bilirubin: 0.8 mg/dL (ref 0.2–1.2)
Total Bilirubin: 1 mg/dL (ref 0.2–1.2)
Total Protein: 7.5 g/dL (ref 6.1–8.1)

## 2015-03-19 LAB — BASIC METABOLIC PANEL
BUN: 13 mg/dL (ref 7–25)
CO2: 28 mmol/L (ref 20–31)
Calcium: 9.3 mg/dL (ref 8.6–10.3)
Chloride: 105 mmol/L (ref 98–110)
Creat: 1.26 mg/dL (ref 0.70–1.33)
Glucose, Bld: 99 mg/dL (ref 65–99)
Potassium: 3.8 mmol/L (ref 3.5–5.3)
Sodium: 142 mmol/L (ref 135–146)

## 2015-03-19 LAB — LIPID PANEL
Cholesterol: 191 mg/dL (ref 125–200)
HDL: 37 mg/dL — ABNORMAL LOW (ref >=40)
LDL Cholesterol: 137 mg/dL — ABNORMAL HIGH (ref <130)
Total CHOL/HDL Ratio: 5.2 Ratio — ABNORMAL HIGH (ref <=5.0)
Triglycerides: 86 mg/dL (ref <150)
VLDL: 17 mg/dL (ref <30)

## 2015-03-19 NOTE — Progress Notes (Signed)
Cardiology Office Note   Date:  03/19/2015   ID:  [name], DOB 02-Jun-1960, MRN [id number]  PCP:  Odette Fraction, MD  Cardiologist:   Thayer Headings, MD   Chief Complaint  Patient presents with  . Coronary Artery Disease   1. Coronary artery disease: He status post CABG in October , 2010. He status post inferior wall myocardial infarction in January, 2011 with multiple stents to the native right coronary artery Dr. Burt Knack 2. Peripheral vascular disease-status post iliac stenting 3. Dyslipidemia 4.. Hypertension  History of Present Illness:  [name] is a 54 yo with CAD and PVD. He has had problems with his left foot and has not been able to exercise. He continues to have lots of sternal wound pain whenever he moves his arm or lifts anything heavy. 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   [name] has had some muscle aches which resolved when he stopped his Crestor. He has hx of severe CAD and PVD. He has had a nagging left ankle injury that keeps him from walking much.  May 12 ,2014: He continues to have occasional CP and frequent episodes of dyspnea. He has been told that he has COPD. He is still in a left leg splint.  August 24, 2013:  [name] was admitted to the hospital in January with chest discomfort. A Lexiscan Myoview study was negative for ischemia. His left systolic function is normal. He's feeling a bit better. He has been in a boot for a chronic left ankle injury for the past several years.    07/07/2014   [name] is a 54 y.o. male who presents for HTN Has been sticking to his diet.  Walks every day.  Has lost 8-9 lbs over the past several weeks.  He is very lightheaded when he first gets up in the AM.  Has been getting worse over the past month or so.  Also has frequent episodes of orthostatic hypotension.  BP has been low on occasion. Today's BP is normal  - he is  off his BP meds.  Avoids salty foods.  No CP or dyspnea. Was told to stay off his BP meds until he saw me. He is feeling better off the BP meds    Nov. 21, 2016:  Doing well.   BP was normal when he left his house this am  A bit higher here.  Exercising a bit.  Still having left leg issus.    Past Medical History  Diagnosis Date  . Coronary artery disease     STATUS POST CABG 01/2009  . Acute MI, inferior wall ([facility]) 04/2009  . Dyslipidemia   . Hypertension   . Dyspnea   . Orthostatic hypotension   . Syncope and collapse   . Chest pain   . Myocardial infarction ([facility])   . COPD (chronic obstructive pulmonary disease) ([facility])   . Hyperlipidemia     Past Surgical History  Procedure Laterality Date  . Cardiac catheterization  10/19/2009  . Ankle surgery      x 2  . Sternal wires removal  07/08/2011    Procedure: STERNAL WIRES REMOVAL;  Surgeon: Grace Isaac, MD;  Location: [facility];  Service: Open Heart Surgery;  Laterality: N/A;  removal of 4th wire  . Pr vein bypass graft,aorto-fem-pop    . Coronary artery bypass graft  2010  . Colonoscopy  April 2013  .  Foot surgery    . Kidney stimulator    . Hernia repair  01/23/12    umb hernia  . Left heart catheterization with coronary/graft angiogram N/A 07/08/2012    Procedure: LEFT HEART CATHETERIZATION WITH Beatrix Fetters;  Surgeon: Thayer Headings, MD;  Location: [facility] CATH LAB;  Service: Cardiovascular;  Laterality: N/A;     Current Outpatient Prescriptions  Medication Sig Dispense Refill  . ADVAIR DISKUS 250-50 MCG/DOSE AEPB Take 2 puffs by mouth daily as needed (for trouble breathing).     Marland Kitchen albuterol (PROVENTIL HFA;VENTOLIN HFA) 108 (90 BASE) MCG/ACT inhaler Inhale 2 puffs into the lungs every 4 (four) hours as needed for wheezing or shortness of breath. 1 Inhaler 1  . aspirin EC 81 MG tablet Take 81 mg by mouth every morning.    Marland Kitchen atorvastatin (LIPITOR) 80 MG tablet TAKE 1 TABLET BY MOUTH EVERY NIGHT AT BEDTIME 30  tablet 0  . clopidogrel (PLAVIX) 75 MG tablet Take 1 tablet (75 mg total) by mouth daily. 90 tablet 0  . ezetimibe (ZETIA) 10 MG tablet Take 1 tablet (10 mg total) by mouth daily. 90 tablet 3  . gabapentin (NEURONTIN) 400 MG capsule Take 2 capsules (800 mg total) by mouth 2 (two) times daily. 120 capsule 6  . hydrochlorothiazide (HYDRODIURIL) 25 MG tablet Take 1 tablet (25 mg total) by mouth every morning. 30 tablet 3  . losartan (COZAAR) 100 MG tablet TAKE 1 TABLET BY MOUTH DAILY 30 tablet 5  . Naltrexone-Bupropion HCl ER 8-90 MG TB12 Take 2 tablets by mouth 2 (two) times daily. 120 tablet 3  . potassium chloride (K-DUR,KLOR-CON) 10 MEQ tablet Take 1 tablet (10 mEq total) by mouth daily. 30 tablet 5  . tiotropium (SPIRIVA) 18 MCG inhalation capsule Place 1 capsule (18 mcg total) into inhaler and inhale daily. 30 capsule 5   No current facility-administered medications for this visit.    Allergies:   Crestor    Social History:  The patient  reports that he quit smoking about 6 years ago. His smoking use included Cigarettes and Cigars. He has a 90 pack-year smoking history. He has never used smokeless tobacco. He reports that he does not drink alcohol or use illicit drugs.   Family History:  The patient's family history includes Cancer in his mother; Coronary artery disease in his father; Diabetes in his father and mother; Heart attack in his father; Heart disease in his father; Hyperlipidemia in his father and mother; Hypertension in his brother, father, and mother.    ROS:  Please see the history of present illness.    Review of Systems: Constitutional:  denies fever, chills, diaphoresis, appetite change and fatigue.  He's been on a diet and has been purposely losing weight.   HEENT: denies photophobia, eye pain, redness, hearing loss, ear pain, congestion, sore throat, rhinorrhea, sneezing, neck pain, neck stiffness and tinnitus.  Respiratory: admits to SOB on occasion   Cardiovascular:  denies chest pain, palpitations and leg swelling.  Gastrointestinal: denies nausea, vomiting, abdominal pain, diarrhea, constipation, blood in stool.  Genitourinary: denies dysuria, urgency, frequency, hematuria, flank pain and difficulty urinating.  Musculoskeletal: denies  myalgias, back pain, joint swelling, arthralgias and gait problem.   Skin: denies pallor, rash and wound.  Neurological: denies dizziness, seizures, syncope, weakness, light-headedness, numbness and headaches.   Hematological: denies adenopathy, easy bruising, personal or family bleeding history.  Psychiatric/ Behavioral: denies suicidal ideation, mood changes, confusion, nervousness, sleep disturbance and agitation.  All other systems are reviewed and negative.    PHYSICAL EXAM: VS:  BP 140/96 mmHg  Pulse 60  Ht 5\' 8"  (1.727 m)  Wt 242 lb 12.8 oz (110.133 kg)  BMI 36.93 kg/m2  SpO2 99% , BMI Body mass index is 36.93 kg/(m^2). GEN: Well nourished, well developed, in no acute distress HEENT: normal Neck: no JVD, carotid bruits, or masses Cardiac: RRR; no murmurs, rubs, or gallops,no edema  Respiratory:  clear to auscultation bilaterally, normal work of breathing GI: soft, nontender, nondistended, + BS MS: no deformity or atrophy Skin: warm and dry, no rash Neuro:  Strength and sensation are intact Psych: normal   EKG:  EKG is ordered today. The ekg ordered today demonstrates sinus bradycardia at 50.  No ST or T wave changes.    Recent Labs: 06/19/2014: ALT 19; BUN 20; Creat 1.37*; Potassium 3.7; Sodium 142    Lipid Panel    Component Value Date/Time   CHOL 181 06/19/2014 0901   TRIG 131 06/19/2014 0901   HDL 32* 06/19/2014 0901   CHOLHDL 5.7 06/19/2014 0901   VLDL 26 06/19/2014 0901   LDLCALC 123* 06/19/2014 0901      Wt Readings from Last 3 Encounters:  03/19/15 242 lb 12.8 oz (110.133 kg)  07/07/14 244 lb (110.678 kg)  06/28/14 244 lb (110.678 kg)      Other studies  Reviewed: Additional studies/ records that were reviewed today include: . Review of the above records demonstrates:    ASSESSMENT AND PLAN:  1. Coronary artery disease: He status post CABG in October , 2010. He status post inferior wall myocardial infarction in January, 2011 with multiple stents to the native right coronary artery Dr. Burt Knack,   No angina   2. Peripheral vascular disease-status post iliac stenting.  No episodes of claudication.  3. Dyslipidemia- lipids were drawn today.  He's on atorvastatin 80 mg a day as well as Zetia. His levels are still a bit elevated. We will encourage him to work harder on a diet and exercise program.  4.. Hypertension-  Has had some issues with orthostatic hypotension.   We'll not add any medications at this time.   Have encouraged him to walk and lose weight .     Current medicines are reviewed at length with the patient today.  The patient does not have concerns regarding medicines.  The following changes have been made:  no change  Labs/ tests ordered today include:   No orders of the defined types were placed in this encounter.    Disposition:   FU with me in 6 months  With lipids, liver enz, BMP     Signed, Nahser, Wonda Cheng, MD  03/19/2015 9:37 AM    [facility] [place], [place], [place]  [address] Phone: [phone]; Fax: [phone]

## 2015-03-19 NOTE — Patient Instructions (Signed)

## 2015-04-27 ENCOUNTER — Ambulatory Visit (INDEPENDENT_AMBULATORY_CARE_PROVIDER_SITE_OTHER): Payer: PPO | Admitting: Family Medicine

## 2015-04-27 DIAGNOSIS — Z23 Encounter for immunization: Secondary | ICD-10-CM

## 2015-07-04 DIAGNOSIS — M25572 Pain in left ankle and joints of left foot: Secondary | ICD-10-CM | POA: Diagnosis not present

## 2015-07-04 DIAGNOSIS — Z7951 Long term (current) use of inhaled steroids: Secondary | ICD-10-CM | POA: Diagnosis not present

## 2015-07-04 DIAGNOSIS — M79672 Pain in left foot: Secondary | ICD-10-CM | POA: Diagnosis not present

## 2015-07-04 DIAGNOSIS — Z79899 Other long term (current) drug therapy: Secondary | ICD-10-CM | POA: Diagnosis not present

## 2015-07-04 DIAGNOSIS — G8921 Chronic pain due to trauma: Secondary | ICD-10-CM | POA: Diagnosis not present

## 2015-07-04 DIAGNOSIS — G894 Chronic pain syndrome: Secondary | ICD-10-CM | POA: Diagnosis not present

## 2015-07-04 DIAGNOSIS — G5782 Other specified mononeuropathies of left lower limb: Secondary | ICD-10-CM | POA: Diagnosis not present

## 2015-07-04 DIAGNOSIS — G8929 Other chronic pain: Secondary | ICD-10-CM | POA: Diagnosis not present

## 2015-07-04 DIAGNOSIS — Z5181 Encounter for therapeutic drug level monitoring: Secondary | ICD-10-CM | POA: Diagnosis not present

## 2015-09-09 ENCOUNTER — Other Ambulatory Visit: Payer: Self-pay | Admitting: Cardiovascular Disease

## 2015-09-09 ENCOUNTER — Other Ambulatory Visit: Payer: Self-pay | Admitting: Family Medicine

## 2015-09-10 NOTE — Telephone Encounter (Signed)
Rx request sent to pharmacy.  

## 2015-09-13 ENCOUNTER — Other Ambulatory Visit: Payer: Self-pay | Admitting: Family Medicine

## 2015-09-13 DIAGNOSIS — M25572 Pain in left ankle and joints of left foot: Secondary | ICD-10-CM | POA: Diagnosis not present

## 2015-09-13 DIAGNOSIS — G8929 Other chronic pain: Secondary | ICD-10-CM | POA: Diagnosis not present

## 2015-09-13 NOTE — Telephone Encounter (Signed)
Ok to refill 

## 2015-09-13 NOTE — Telephone Encounter (Signed)
Prescription sent to pharmacy.

## 2015-09-13 NOTE — Telephone Encounter (Signed)
ok 

## 2015-09-20 ENCOUNTER — Encounter: Payer: Self-pay | Admitting: Cardiovascular Disease

## 2015-09-20 ENCOUNTER — Ambulatory Visit (INDEPENDENT_AMBULATORY_CARE_PROVIDER_SITE_OTHER): Payer: PPO | Admitting: Cardiovascular Disease

## 2015-09-20 VITALS — BP 130/90 | HR 56 | Ht 69.0 in | Wt 292.0 lb

## 2015-09-20 DIAGNOSIS — E785 Hyperlipidemia, unspecified: Secondary | ICD-10-CM

## 2015-09-20 DIAGNOSIS — I10 Essential (primary) hypertension: Secondary | ICD-10-CM

## 2015-09-20 DIAGNOSIS — I2510 Atherosclerotic heart disease of native coronary artery without angina pectoris: Secondary | ICD-10-CM | POA: Diagnosis not present

## 2015-09-20 LAB — COMPREHENSIVE METABOLIC PANEL
ALT: 20 U/L (ref 9–46)
AST: 18 U/L (ref 10–35)
Albumin: 4.3 g/dL (ref 3.6–5.1)
Alkaline Phosphatase: 89 U/L (ref 40–115)
BUN: 14 mg/dL (ref 7–25)
CO2: 27 mmol/L (ref 20–31)
Calcium: 9.6 mg/dL (ref 8.6–10.3)
Chloride: 104 mmol/L (ref 98–110)
Creat: 1.38 mg/dL — ABNORMAL HIGH (ref 0.70–1.33)
Glucose, Bld: 104 mg/dL — ABNORMAL HIGH (ref 65–99)
Potassium: 3.7 mmol/L (ref 3.5–5.3)
Sodium: 143 mmol/L (ref 135–146)
Total Bilirubin: 1.1 mg/dL (ref 0.2–1.2)
Total Protein: 7.1 g/dL (ref 6.1–8.1)

## 2015-09-20 LAB — LIPID PANEL
Cholesterol: 184 mg/dL (ref 125–200)
HDL: 38 mg/dL — ABNORMAL LOW (ref >=40)
LDL Cholesterol: 125 mg/dL (ref <130)
Total CHOL/HDL Ratio: 4.8 Ratio (ref <=5.0)
Triglycerides: 104 mg/dL (ref <150)
VLDL: 21 mg/dL (ref <30)

## 2015-09-20 NOTE — Patient Instructions (Addendum)
Medication Instructions:   Your physician recommends that you continue on your current medications as directed. Please refer to the Current Medication list given to you today.  Labwork: Lab work to be done today--CMP, Lipid profile  Testing/Procedures: none  Follow-Up: Your physician wants you to follow-up in: 6 months. Fasting lab work to be done at this appointment or a few days before appointment.   You will receive a reminder letter in the mail two months in advance. If you don't receive a letter, please call our office to schedule the follow-up appointment.   Any Other Special Instructions Will Be Listed Below (If Applicable).     If you need a refill on your cardiac medications before your next appointment, please call your pharmacy.

## 2015-09-20 NOTE — Addendum Note (Signed)
Addended by: Thompson Grayer on: 09/20/2015 08:37 AM   Modules accepted: Orders

## 2015-09-20 NOTE — Progress Notes (Signed)
Cardiology Office Note   Date:  09/20/2015   ID:  [name], DOB 1960/08/18, MRN [id number]  PCP:  Odette Fraction, MD  Cardiologist:   Mertie Moores, MD   Chief Complaint  Patient presents with  . Coronary Artery Disease   1. Coronary artery disease: He status post CABG in October , 2010. He status post inferior wall myocardial infarction in January, 2011 with multiple stents to the native right coronary artery Dr. Burt Knack 2. Peripheral vascular disease-status post iliac stenting 3. Dyslipidemia 4.. Hypertension  History of Present Illness:  [name] is a 55 yo with CAD and PVD. He has had problems with his left foot and has not been able to exercise. He continues to have lots of sternal wound pain whenever he moves his arm or lifts anything heavy. 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   [name] has had some muscle aches which resolved when he stopped his Crestor. He has hx of severe CAD and PVD. He has had a nagging left ankle injury that keeps him from walking much.  May 12 ,2014: He continues to have occasional CP and frequent episodes of dyspnea. He has been told that he has COPD. He is still in a left leg splint.  August 24, 2013:  [name] was admitted to the hospital in January with chest discomfort. A Lexiscan Myoview study was negative for ischemia. His left systolic function is normal. He's feeling a bit better. He has been in a boot for a chronic left ankle injury for the past several years.    07/07/2014   [name] is a 55 y.o. male who presents for HTN Has been sticking to his diet.  Walks every day.  Has lost 8-9 lbs over the past several weeks.  He is very lightheaded when he first gets up in the AM.  Has been getting worse over the past month or so.  Also has frequent episodes of orthostatic hypotension.  BP has been low on occasion. Today's BP is normal  - he is off  his BP meds.  Avoids salty foods.  No CP or dyspnea. Was told to stay off his BP meds until he saw me. He is feeling better off the BP meds    Nov. 21, 2016:  Doing well.   BP was normal when he left his house this am  A bit higher here.  Exercising a bit.  Still having left leg issus.    Sep 20, 2015:  Is having some episodes of chest tightness Occurs anytime - rest or activity . Last 30 seconds.  Has not tried an NTG.  Has more foot problems ,  Not able to exercise much .  Has lots of heartburn  Issues.   Seems different than angina    Past Medical History  Diagnosis Date  . Coronary artery disease     STATUS POST CABG 01/2009  . Acute MI, inferior wall ([place]) 04/2009  . Dyslipidemia   . Hypertension   . Dyspnea   . Orthostatic hypotension   . Syncope and collapse   . Chest pain   . Myocardial infarction ([place])   . COPD (chronic obstructive pulmonary disease) ([place])   . Hyperlipidemia     Past Surgical History  Procedure Laterality Date  . Cardiac catheterization  10/19/2009  . Ankle surgery      x 2  . Sternal wires  removal  07/08/2011    Procedure: STERNAL WIRES REMOVAL;  Surgeon: Grace Isaac, MD;  Location: [facility];  Service: Open Heart Surgery;  Laterality: N/A;  removal of 4th wire  . Pr vein bypass graft,aorto-fem-pop    . Coronary artery bypass graft  2010  . Colonoscopy  April 2013  . Foot surgery    . Kidney stimulator    . Hernia repair  01/23/12    umb hernia  . Left heart catheterization with coronary/graft angiogram N/A 07/08/2012    Procedure: LEFT HEART CATHETERIZATION WITH Beatrix Fetters;  Surgeon: Thayer Headings, MD;  Location: [facility] CATH LAB;  Service: Cardiovascular;  Laterality: N/A;     Current Outpatient Prescriptions  Medication Sig Dispense Refill  . ADVAIR DISKUS 250-50 MCG/DOSE AEPB Take 2 puffs by mouth daily as needed (for trouble breathing).     Marland Kitchen albuterol (PROVENTIL HFA;VENTOLIN HFA) 108 (90 BASE) MCG/ACT inhaler  Inhale 2 puffs into the lungs every 4 (four) hours as needed for wheezing or shortness of breath. 1 Inhaler 1  . aspirin EC 81 MG tablet Take 81 mg by mouth every morning.    Marland Kitchen atorvastatin (LIPITOR) 80 MG tablet TAKE 1 TABLET BY MOUTH EVERY NIGHT AT BEDTIME 30 tablet 3  . clopidogrel (PLAVIX) 75 MG tablet TAKE 1 TABLET BY MOUTH EVERY DAY 90 tablet 0  . ezetimibe (ZETIA) 10 MG tablet Take 1 tablet (10 mg total) by mouth daily. 90 tablet 3  . gabapentin (NEURONTIN) 400 MG capsule Take 2 capsules (800 mg total) by mouth 2 (two) times daily. 120 capsule 6  . hydrochlorothiazide (HYDRODIURIL) 25 MG tablet TAKE 1 TABLET BY MOUTH EVERY MORNING 30 tablet 3  . losartan (COZAAR) 100 MG tablet TAKE 1 TABLET BY MOUTH DAILY 30 tablet 5  . potassium chloride (K-DUR,KLOR-CON) 10 MEQ tablet Take 1 tablet (10 mEq total) by mouth daily. 30 tablet 5  . tiotropium (SPIRIVA) 18 MCG inhalation capsule Place 1 capsule (18 mcg total) into inhaler and inhale daily. 30 capsule 5   No current facility-administered medications for this visit.    Allergies:   Crestor    Social History:  The patient  reports that he quit smoking about 6 years ago. His smoking use included Cigarettes and Cigars. He has a 90 pack-year smoking history. He has never used smokeless tobacco. He reports that he does not drink alcohol or use illicit drugs.   Family History:  The patient's family history includes Cancer in his mother; Coronary artery disease in his father; Diabetes in his father and mother; Heart attack in his father; Heart disease in his father; Hyperlipidemia in his father and mother; Hypertension in his brother, father, and mother.    ROS:  Please see the history of present illness.    Review of Systems: Constitutional:  denies fever, chills, diaphoresis, appetite change and fatigue.  He's been on a diet and has been purposely losing weight.   HEENT: denies photophobia, eye pain, redness, hearing loss, ear pain, congestion,  sore throat, rhinorrhea, sneezing, neck pain, neck stiffness and tinnitus.  Respiratory: admits to SOB on occasion   Cardiovascular: denies chest pain, palpitations and leg swelling.  Gastrointestinal: denies nausea, vomiting, abdominal pain, diarrhea, constipation, blood in stool.  Genitourinary: denies dysuria, urgency, frequency, hematuria, flank pain and difficulty urinating.  Musculoskeletal: denies  myalgias, back pain, joint swelling, arthralgias and gait problem.   Skin: denies pallor, rash and wound.  Neurological: denies dizziness, seizures, syncope, weakness, light-headedness, numbness and  headaches.   Hematological: denies adenopathy, easy bruising, personal or family bleeding history.  Psychiatric/ Behavioral: denies suicidal ideation, mood changes, confusion, nervousness, sleep disturbance and agitation.       All other systems are reviewed and negative.    PHYSICAL EXAM: VS:  BP 130/90 mmHg  Pulse 56  Ht 5\' 9"  (1.753 m)  Wt 292 lb (132.45 kg)  BMI 43.10 kg/m2 , BMI Body mass index is 43.1 kg/(m^2). GEN: Well nourished, well developed, in no acute distress HEENT: normal Neck: no JVD, carotid bruits, or masses Cardiac: RRR; no murmurs, rubs, or gallops,no edema  Respiratory:  clear to auscultation bilaterally, normal work of breathing GI: soft, nontender, nondistended, + BS MS: no deformity or atrophy Skin: warm and dry, no rash Neuro:  Strength and sensation are intact Psych: normal   EKG:  EKG is ordered today. The ekg ordered today demonstrates sinus bradycardia at 56.  Inferior MI  ,  NS ST changes   Recent Labs: 03/19/2015: ALT 18; BUN 13; Creat 1.26; Potassium 3.8; Sodium 142    Lipid Panel    Component Value Date/Time   CHOL 191 03/19/2015 0835   TRIG 86 03/19/2015 0835   HDL 37* 03/19/2015 0835   CHOLHDL 5.2* 03/19/2015 0835   VLDL 17 03/19/2015 0835   LDLCALC 137* 03/19/2015 0835      Wt Readings from Last 3 Encounters:  09/20/15 292 lb  (132.45 kg)  03/19/15 242 lb 12.8 oz (110.133 kg)  07/07/14 244 lb (110.678 kg)      Other studies Reviewed: Additional studies/ records that were reviewed today include: . Review of the above records demonstrates:    ASSESSMENT AND PLAN:  1. Coronary artery disease: He status post CABG in October , 2010. He status post inferior wall myocardial infarction in January, 2011 with multiple stents to the native right coronary artery Dr. Burt Knack,   No angina  Has some episodes of chest tightness but these are different from his previous episodes of angina. In addition, they typically occur at rest and only lasts 30 seconds. He has not tried nitroglycerin.  I have advised him to call us if these episodes of chest discomfort/tightness seemed to be getting worse. He does comment that he's had frequent episodes of indigestion and thinks that these episodes might be related to indigestion.  2. Peripheral vascular disease-status post iliac stenting.  No episodes of claudication.  3. Dyslipidemia- lipids were drawn today.  He's on atorvastatin 80 mg a day as well as Zetia. His levels are still a bit elevated. We will encourage him to work harder on a diet and exercise program.  4.. Hypertension-  Has had some issues with orthostatic hypotension.   We'll not add any medications at this time.   Have encouraged him to walk and lose weight .     Current medicines are reviewed at length with the patient today.  The patient does not have concerns regarding medicines.  The following changes have been made:  no change  Labs/ tests ordered today include:   No orders of the defined types were placed in this encounter.    Disposition:   FU with me in 6 months  .  Will get fasting lipids, liver and BMP at that time     Signed, Mertie Moores, MD  09/20/2015 8:15 AM    [address], [place], [place]  [address] Phone: [phone]; Fax: [phone]

## 2015-10-31 ENCOUNTER — Ambulatory Visit (INDEPENDENT_AMBULATORY_CARE_PROVIDER_SITE_OTHER): Payer: PPO | Admitting: Physician Assistant

## 2015-10-31 VITALS — BP 146/98 | Temp 97.8°F | Ht 69.0 in | Wt 261.0 lb

## 2015-10-31 DIAGNOSIS — H00015 Hordeolum externum left lower eyelid: Secondary | ICD-10-CM | POA: Diagnosis not present

## 2015-10-31 MED ORDER — SULFACETAMIDE-PREDNISOLONE 10-0.2 % OP OINT: 1.0000 "application " | TOPICAL_OINTMENT | Freq: Four times a day (QID) | OPHTHALMIC | Status: DC

## 2015-10-31 NOTE — Progress Notes (Signed)
Patient ID: [name] MRN: [id number], DOB: 10-Jul-1960, 55 y.o. Date of Encounter: 10/31/2015, 12:54 PM    Chief Complaint:  Chief Complaint  Patient presents with  . Stye    pt has a red bump on left bottom eye lid, has been there for 2 months      HPI: 55 y.o. year old white male presents with above.   Says that he has seen no medical provider about this until today. Has used no treatment to treat this except for applying some warm compresses and using some over-the-counter saline eyedrops. No change in his vision. No blurred vision. No other associated symptoms. No other complaints or concerns.    Home Meds:   Outpatient Prescriptions Prior to Visit  Medication Sig Dispense Refill  . ADVAIR DISKUS 250-50 MCG/DOSE AEPB Take 2 puffs by mouth daily as needed (for trouble breathing).     Marland Kitchen albuterol (PROVENTIL HFA;VENTOLIN HFA) 108 (90 BASE) MCG/ACT inhaler Inhale 2 puffs into the lungs every 4 (four) hours as needed for wheezing or shortness of breath. 1 Inhaler 1  . aspirin EC 81 MG tablet Take 81 mg by mouth every morning.    Marland Kitchen atorvastatin (LIPITOR) 80 MG tablet TAKE 1 TABLET BY MOUTH EVERY NIGHT AT BEDTIME 30 tablet 3  . clopidogrel (PLAVIX) 75 MG tablet TAKE 1 TABLET BY MOUTH EVERY DAY 90 tablet 0  . ezetimibe (ZETIA) 10 MG tablet Take 1 tablet (10 mg total) by mouth daily. 90 tablet 3  . gabapentin (NEURONTIN) 400 MG capsule Take 2 capsules (800 mg total) by mouth 2 (two) times daily. 120 capsule 6  . hydrochlorothiazide (HYDRODIURIL) 25 MG tablet TAKE 1 TABLET BY MOUTH EVERY MORNING 30 tablet 3  . losartan (COZAAR) 100 MG tablet TAKE 1 TABLET BY MOUTH DAILY 30 tablet 5  . potassium chloride (K-DUR,KLOR-CON) 10 MEQ tablet Take 1 tablet (10 mEq total) by mouth daily. 30 tablet 5  . tiotropium (SPIRIVA) 18 MCG inhalation capsule Place 1 capsule (18 mcg total) into inhaler and inhale daily. 30 capsule 5   No facility-administered medications prior to visit.     Allergies:  Allergies  Allergen Reactions  . Crestor [Rosuvastatin]     Muscle aches.      Review of Systems: See HPI for pertinent ROS. All other ROS negative.    Physical Exam: Blood pressure 146/98, temperature 97.8 F (36.6 C), temperature source Oral, height 5\' 9"  (1.753 m), weight 261 lb (118.389 kg)., Body mass index is 38.53 kg/(m^2). General:  Overweight white male. Appears in no acute distress. HEENT: Normocephalic, atraumatic, eyes without discharge, sclera non-icteric. Bilateral conjunctiva normal with no injection, no discharge.  Left Eye---lower eyelid--pink papule on external aspect of lower lid. No erythema of surrounding area. No drainage from site.  Neck: Supple. No thyromegaly. No lymphadenopathy. Lungs: Clear bilaterally to auscultation without wheezes, rales, or rhonchi. Breathing is unlabored. Heart: Regular rhythm. No murmurs, rubs, or gallops. Msk:  Strength and tone normal for age. Extremities/Skin: Warm and dry.  Neuro: Alert and oriented X 3. Moves all extremities spontaneously. Gait is normal. CNII-XII grossly in tact. Psych:  Responds to questions appropriately with a normal affect.     ASSESSMENT AND PLAN:  55 y.o. year old male with  1. Hordeolum externum of left lower eyelid 4 times a day he is to apply warm compress to the area then allow time for this to dry then apply this ointment. If does not resolve in one week  then follow-up. - sulfacetaminde-prednisoLONE (BLEPHAMIDE S.O.P.) ophthalmic ointment; Place 1 application into the left eye 4 (four) times daily.  Dispense: 3.5 g; Refill: 0   Signed, [address] [place], [place], [facility] 10/31/2015 12:54 PM

## 2015-11-20 ENCOUNTER — Telehealth: Payer: Self-pay | Admitting: Family Medicine

## 2015-11-20 DIAGNOSIS — H00015 Hordeolum externum left lower eyelid: Secondary | ICD-10-CM

## 2015-11-20 NOTE — Telephone Encounter (Signed)
Pt made aware of referral and ir was faxed to local opthalmologist

## 2015-11-20 NOTE — Telephone Encounter (Signed)
Patient calling to say that the stye that [name] diagnosed is not better and would like to know what to do next  [phone]

## 2015-11-20 NOTE — Telephone Encounter (Signed)
Have him see an eye doctor. May have to be excised.

## 2015-11-27 DIAGNOSIS — H029 Unspecified disorder of eyelid: Secondary | ICD-10-CM | POA: Diagnosis not present

## 2015-11-29 ENCOUNTER — Telehealth: Payer: Self-pay | Admitting: Family Medicine

## 2015-11-29 NOTE — Telephone Encounter (Signed)
Pt seeing opthalmology for H00.015 Hordeolum left lower eye lid.  Auth for 6 visits 11/27/15 - 05/25/16 [id number] rec'd form HTA

## 2015-12-06 DIAGNOSIS — Z5181 Encounter for therapeutic drug level monitoring: Secondary | ICD-10-CM | POA: Diagnosis not present

## 2015-12-06 DIAGNOSIS — Z79899 Other long term (current) drug therapy: Secondary | ICD-10-CM | POA: Diagnosis not present

## 2015-12-06 DIAGNOSIS — G8929 Other chronic pain: Secondary | ICD-10-CM | POA: Diagnosis not present

## 2015-12-06 DIAGNOSIS — M25572 Pain in left ankle and joints of left foot: Secondary | ICD-10-CM | POA: Diagnosis not present

## 2015-12-06 DIAGNOSIS — G894 Chronic pain syndrome: Secondary | ICD-10-CM | POA: Diagnosis not present

## 2015-12-09 ENCOUNTER — Other Ambulatory Visit: Payer: Self-pay | Admitting: Cardiovascular Disease

## 2015-12-11 ENCOUNTER — Other Ambulatory Visit: Payer: Self-pay | Admitting: *Deleted

## 2015-12-11 MED ORDER — LOSARTAN POTASSIUM 100 MG PO TABS: ORAL_TABLET | ORAL | 11 refills | Status: DC

## 2015-12-11 MED ORDER — CLOPIDOGREL BISULFATE 75 MG PO TABS: 75.0000 mg | ORAL_TABLET | Freq: Every day | ORAL | 3 refills | Status: DC

## 2015-12-19 DIAGNOSIS — H029 Unspecified disorder of eyelid: Secondary | ICD-10-CM | POA: Diagnosis not present

## 2015-12-19 DIAGNOSIS — H43811 Vitreous degeneration, right eye: Secondary | ICD-10-CM | POA: Diagnosis not present

## 2015-12-19 DIAGNOSIS — H04221 Epiphora due to insufficient drainage, right lacrimal gland: Secondary | ICD-10-CM | POA: Diagnosis not present

## 2015-12-19 DIAGNOSIS — H35033 Hypertensive retinopathy, bilateral: Secondary | ICD-10-CM | POA: Diagnosis not present

## 2015-12-25 DIAGNOSIS — H04221 Epiphora due to insufficient drainage, right lacrimal gland: Secondary | ICD-10-CM | POA: Diagnosis not present

## 2015-12-25 DIAGNOSIS — Z79899 Other long term (current) drug therapy: Secondary | ICD-10-CM | POA: Diagnosis not present

## 2015-12-25 DIAGNOSIS — D485 Neoplasm of uncertain behavior of skin: Secondary | ICD-10-CM | POA: Diagnosis not present

## 2015-12-25 DIAGNOSIS — I2510 Atherosclerotic heart disease of native coronary artery without angina pectoris: Secondary | ICD-10-CM | POA: Diagnosis not present

## 2015-12-25 DIAGNOSIS — Z888 Allergy status to other drugs, medicaments and biological substances status: Secondary | ICD-10-CM | POA: Diagnosis not present

## 2015-12-25 DIAGNOSIS — Z87891 Personal history of nicotine dependence: Secondary | ICD-10-CM | POA: Diagnosis not present

## 2015-12-25 DIAGNOSIS — H01003 Unspecified blepharitis right eye, unspecified eyelid: Secondary | ICD-10-CM | POA: Diagnosis not present

## 2015-12-25 DIAGNOSIS — I119 Hypertensive heart disease without heart failure: Secondary | ICD-10-CM | POA: Diagnosis not present

## 2015-12-25 DIAGNOSIS — H35033 Hypertensive retinopathy, bilateral: Secondary | ICD-10-CM | POA: Diagnosis not present

## 2015-12-25 DIAGNOSIS — Z7951 Long term (current) use of inhaled steroids: Secondary | ICD-10-CM | POA: Diagnosis not present

## 2015-12-25 DIAGNOSIS — Z7902 Long term (current) use of antithrombotics/antiplatelets: Secondary | ICD-10-CM | POA: Diagnosis not present

## 2015-12-25 DIAGNOSIS — H0259 Other disorders affecting eyelid function: Secondary | ICD-10-CM | POA: Diagnosis not present

## 2015-12-25 DIAGNOSIS — H01006 Unspecified blepharitis left eye, unspecified eyelid: Secondary | ICD-10-CM | POA: Diagnosis not present

## 2015-12-25 DIAGNOSIS — H02403 Unspecified ptosis of bilateral eyelids: Secondary | ICD-10-CM | POA: Diagnosis not present

## 2015-12-25 DIAGNOSIS — J449 Chronic obstructive pulmonary disease, unspecified: Secondary | ICD-10-CM | POA: Diagnosis not present

## 2015-12-25 DIAGNOSIS — H2513 Age-related nuclear cataract, bilateral: Secondary | ICD-10-CM | POA: Diagnosis not present

## 2016-01-01 DIAGNOSIS — H0015 Chalazion left lower eyelid: Secondary | ICD-10-CM | POA: Diagnosis not present

## 2016-01-01 DIAGNOSIS — D485 Neoplasm of uncertain behavior of skin: Secondary | ICD-10-CM | POA: Diagnosis not present

## 2016-01-08 ENCOUNTER — Ambulatory Visit (INDEPENDENT_AMBULATORY_CARE_PROVIDER_SITE_OTHER): Payer: PPO | Admitting: Family Medicine

## 2016-01-08 DIAGNOSIS — Z23 Encounter for immunization: Secondary | ICD-10-CM

## 2016-01-15 DIAGNOSIS — Z888 Allergy status to other drugs, medicaments and biological substances status: Secondary | ICD-10-CM | POA: Diagnosis not present

## 2016-01-15 DIAGNOSIS — H0015 Chalazion left lower eyelid: Secondary | ICD-10-CM | POA: Diagnosis not present

## 2016-01-15 DIAGNOSIS — J449 Chronic obstructive pulmonary disease, unspecified: Secondary | ICD-10-CM | POA: Diagnosis not present

## 2016-01-15 DIAGNOSIS — H04221 Epiphora due to insufficient drainage, right lacrimal gland: Secondary | ICD-10-CM | POA: Diagnosis not present

## 2016-01-15 DIAGNOSIS — I2510 Atherosclerotic heart disease of native coronary artery without angina pectoris: Secondary | ICD-10-CM | POA: Diagnosis not present

## 2016-01-15 DIAGNOSIS — H2513 Age-related nuclear cataract, bilateral: Secondary | ICD-10-CM | POA: Diagnosis not present

## 2016-01-15 DIAGNOSIS — I119 Hypertensive heart disease without heart failure: Secondary | ICD-10-CM | POA: Diagnosis not present

## 2016-01-15 DIAGNOSIS — Z87891 Personal history of nicotine dependence: Secondary | ICD-10-CM | POA: Diagnosis not present

## 2016-01-15 DIAGNOSIS — Z79899 Other long term (current) drug therapy: Secondary | ICD-10-CM | POA: Diagnosis not present

## 2016-01-15 DIAGNOSIS — H029 Unspecified disorder of eyelid: Secondary | ICD-10-CM | POA: Diagnosis not present

## 2016-01-15 DIAGNOSIS — H04551 Acquired stenosis of right nasolacrimal duct: Secondary | ICD-10-CM | POA: Diagnosis not present

## 2016-01-15 DIAGNOSIS — Z7951 Long term (current) use of inhaled steroids: Secondary | ICD-10-CM | POA: Diagnosis not present

## 2016-02-21 DIAGNOSIS — G8929 Other chronic pain: Secondary | ICD-10-CM | POA: Diagnosis not present

## 2016-02-21 DIAGNOSIS — G894 Chronic pain syndrome: Secondary | ICD-10-CM | POA: Diagnosis not present

## 2016-02-21 DIAGNOSIS — M25572 Pain in left ankle and joints of left foot: Secondary | ICD-10-CM | POA: Diagnosis not present

## 2016-03-11 DIAGNOSIS — H04551 Acquired stenosis of right nasolacrimal duct: Secondary | ICD-10-CM | POA: Insufficient documentation

## 2016-05-13 ENCOUNTER — Ambulatory Visit (INDEPENDENT_AMBULATORY_CARE_PROVIDER_SITE_OTHER): Payer: PPO | Admitting: Family Medicine

## 2016-05-13 ENCOUNTER — Encounter: Payer: Self-pay | Admitting: Family Medicine

## 2016-05-13 VITALS — BP 136/72 | HR 88 | Temp 99.6°F | Resp 14 | Ht 69.0 in | Wt 262.0 lb

## 2016-05-13 DIAGNOSIS — J441 Chronic obstructive pulmonary disease with (acute) exacerbation: Secondary | ICD-10-CM

## 2016-05-13 DIAGNOSIS — B349 Viral infection, unspecified: Secondary | ICD-10-CM | POA: Diagnosis not present

## 2016-05-13 DIAGNOSIS — R509 Fever, unspecified: Secondary | ICD-10-CM | POA: Diagnosis not present

## 2016-05-13 LAB — INFLUENZA A AND B AG, IMMUNOASSAY
Influenza A Antigen: NOT DETECTED
Influenza B Antigen: NOT DETECTED

## 2016-05-13 MED ORDER — GUAIFENESIN-CODEINE 100-10 MG/5ML PO SOLN: 5.0000 mL | Freq: Four times a day (QID) | ORAL | 0 refills | Status: DC | PRN

## 2016-05-13 MED ORDER — AZITHROMYCIN 250 MG PO TABS: ORAL_TABLET | ORAL | 0 refills | Status: DC

## 2016-05-13 MED ORDER — PREDNISONE 20 MG PO TABS: 40.0000 mg | ORAL_TABLET | Freq: Every day | ORAL | 0 refills | Status: DC

## 2016-05-13 NOTE — Patient Instructions (Signed)
Flu is negative  Cough medicine given Take prednisone as prescribed Antibiotics F/U as needed

## 2016-05-13 NOTE — Progress Notes (Signed)
   Subjective:    Patient ID: [name], male    DOB: [birth year], 56 y.o.   MRN: [id number]  Patient presents for Illness (x1 week- fever, productive cough with green colored mucus, body aches)  She here with cough and chest congestion productive cough body aches which started in the past couple days along with low-grade fever. His wife has been sick recently. He has underlying COPD. He has been using his albuterol inhaler more than usual. He has not had any nausea vomiting or any GI symptoms. He has been taking Mucinex   Review Of Systems:  GEN- denies fatigue,+ fever, weight loss,weakness, recent illness HEENT- denies eye drainage, change in vision,+ nasal discharge, CVS- denies chest pain, palpitations RESP- denies SOB, +cough, +wheeze ABD- denies N/V, change in stools, abd pain GU- denies dysuria, hematuria, dribbling, incontinence MSK- denies joint pain,+ muscle aches, injury Neuro- denies headache, dizziness, syncope, seizure activity       Objective:    BP 136/72 (BP Location: Left Arm, Patient Position: Sitting, Cuff Size: Large)   Pulse 88   Temp 99.6 F (37.6 C) (Oral)   Resp 14   Ht 5\' 9"  (1.753 m)   Wt 262 lb (118.8 kg)   SpO2 98%   BMI 38.69 kg/m  GEN- NAD, alert and oriented x3,sick appearing  HEENT- PERRL, EOMI, non injected sclera, pink conjunctiva, MMM, oropharynx clear, nares clear rhinorrhea Neck- Supple, no LAD CVS- RRR, no murmur RESP-bilat wheeze, congestion, no rales, normal WOB, no retractions  EXT- No edema Pulses- Radial  2+        Assessment & Plan:      Problem List Items Addressed This Visit    COPD exacerbation (HCC)   Relevant Medications   predniSONE (DELTASONE) 20 MG tablet   guaiFENesin-codeine 100-10 MG/5ML syrup   azithromycin (ZITHROMAX) 250 MG tablet    Other Visit Diagnoses    Viral illness    -  Primary   Virus setting off now COPD exacerbation with worsening symptoms. Based on history give zpak, prednisone,  albuterol, robotussin codiene. Flu neg   Relevant Medications   azithromycin (ZITHROMAX) 250 MG tablet   Other Relevant Orders   Influenza A and B Ag, Immunoassay (Completed)      Note: This dictation was prepared with Dragon dictation along with smaller phrase technology. Any transcriptional errors that result from this process are unintentional.

## 2016-05-22 DIAGNOSIS — F1190 Opioid use, unspecified, uncomplicated: Secondary | ICD-10-CM | POA: Diagnosis not present

## 2016-05-22 DIAGNOSIS — G8921 Chronic pain due to trauma: Secondary | ICD-10-CM | POA: Diagnosis not present

## 2016-05-22 DIAGNOSIS — Z79899 Other long term (current) drug therapy: Secondary | ICD-10-CM | POA: Diagnosis not present

## 2016-05-22 DIAGNOSIS — G894 Chronic pain syndrome: Secondary | ICD-10-CM | POA: Diagnosis not present

## 2016-05-31 ENCOUNTER — Encounter (HOSPITAL_COMMUNITY): Payer: Self-pay

## 2016-05-31 ENCOUNTER — Emergency Department (HOSPITAL_COMMUNITY): Payer: PPO

## 2016-05-31 DIAGNOSIS — I2583 Coronary atherosclerosis due to lipid rich plaque: Secondary | ICD-10-CM | POA: Diagnosis not present

## 2016-05-31 DIAGNOSIS — Z951 Presence of aortocoronary bypass graft: Secondary | ICD-10-CM | POA: Diagnosis not present

## 2016-05-31 DIAGNOSIS — Z9861 Coronary angioplasty status: Secondary | ICD-10-CM | POA: Insufficient documentation

## 2016-05-31 DIAGNOSIS — Z833 Family history of diabetes mellitus: Secondary | ICD-10-CM | POA: Diagnosis not present

## 2016-05-31 DIAGNOSIS — J441 Chronic obstructive pulmonary disease with (acute) exacerbation: Secondary | ICD-10-CM | POA: Diagnosis not present

## 2016-05-31 DIAGNOSIS — I10 Essential (primary) hypertension: Secondary | ICD-10-CM | POA: Diagnosis not present

## 2016-05-31 DIAGNOSIS — R0602 Shortness of breath: Secondary | ICD-10-CM | POA: Diagnosis not present

## 2016-05-31 DIAGNOSIS — Z7902 Long term (current) use of antithrombotics/antiplatelets: Secondary | ICD-10-CM | POA: Diagnosis not present

## 2016-05-31 DIAGNOSIS — I25710 Atherosclerosis of autologous vein coronary artery bypass graft(s) with unstable angina pectoris: Secondary | ICD-10-CM | POA: Diagnosis not present

## 2016-05-31 DIAGNOSIS — Z87891 Personal history of nicotine dependence: Secondary | ICD-10-CM | POA: Insufficient documentation

## 2016-05-31 DIAGNOSIS — Z8049 Family history of malignant neoplasm of other genital organs: Secondary | ICD-10-CM | POA: Insufficient documentation

## 2016-05-31 DIAGNOSIS — Z7982 Long term (current) use of aspirin: Secondary | ICD-10-CM | POA: Insufficient documentation

## 2016-05-31 DIAGNOSIS — I25110 Atherosclerotic heart disease of native coronary artery with unstable angina pectoris: Secondary | ICD-10-CM | POA: Diagnosis not present

## 2016-05-31 DIAGNOSIS — I252 Old myocardial infarction: Secondary | ICD-10-CM | POA: Insufficient documentation

## 2016-05-31 DIAGNOSIS — I70219 Atherosclerosis of native arteries of extremities with intermittent claudication, unspecified extremity: Secondary | ICD-10-CM | POA: Diagnosis not present

## 2016-05-31 DIAGNOSIS — Z79899 Other long term (current) drug therapy: Secondary | ICD-10-CM | POA: Diagnosis not present

## 2016-05-31 DIAGNOSIS — Z8249 Family history of ischemic heart disease and other diseases of the circulatory system: Secondary | ICD-10-CM | POA: Diagnosis not present

## 2016-05-31 DIAGNOSIS — E785 Hyperlipidemia, unspecified: Secondary | ICD-10-CM | POA: Insufficient documentation

## 2016-05-31 DIAGNOSIS — Z888 Allergy status to other drugs, medicaments and biological substances status: Secondary | ICD-10-CM | POA: Diagnosis not present

## 2016-05-31 DIAGNOSIS — K429 Umbilical hernia without obstruction or gangrene: Secondary | ICD-10-CM | POA: Diagnosis not present

## 2016-05-31 DIAGNOSIS — I209 Angina pectoris, unspecified: Secondary | ICD-10-CM | POA: Diagnosis not present

## 2016-05-31 DIAGNOSIS — R079 Chest pain, unspecified: Secondary | ICD-10-CM | POA: Diagnosis not present

## 2016-05-31 DIAGNOSIS — I208 Other forms of angina pectoris: Secondary | ICD-10-CM | POA: Diagnosis not present

## 2016-05-31 LAB — I-STAT TROPONIN, ED: Troponin i, poc: 0.04 ng/mL (ref 0.00–0.08)

## 2016-05-31 LAB — CBC
HCT: 45.5 % (ref 39.0–52.0)
Hemoglobin: 15.6 g/dL (ref 13.0–17.0)
MCH: 28.9 pg (ref 26.0–34.0)
MCHC: 34.3 g/dL (ref 30.0–36.0)
MCV: 84.4 fL (ref 78.0–100.0)
Platelets: 200 10*3/uL (ref 150–400)
RBC: 5.39 MIL/uL (ref 4.22–5.81)
RDW: 13.4 % (ref 11.5–15.5)
WBC: 12.7 10*3/uL — ABNORMAL HIGH (ref 4.0–10.5)

## 2016-05-31 LAB — BASIC METABOLIC PANEL
Anion gap: 15 (ref 5–15)
BUN: 19 mg/dL (ref 6–20)
CO2: 24 mmol/L (ref 22–32)
Calcium: 11.8 mg/dL — ABNORMAL HIGH (ref 8.9–10.3)
Chloride: 99 mmol/L — ABNORMAL LOW (ref 101–111)
Creatinine, Ser: 1.66 mg/dL — ABNORMAL HIGH (ref 0.61–1.24)
GFR calc Af Amer: 53 mL/min — ABNORMAL LOW (ref >60)
GFR calc non Af Amer: 46 mL/min — ABNORMAL LOW (ref >60)
Glucose, Bld: 200 mg/dL — ABNORMAL HIGH (ref 65–99)
Potassium: 3.7 mmol/L (ref 3.5–5.1)
Sodium: 138 mmol/L (ref 135–145)

## 2016-05-31 NOTE — ED Triage Notes (Signed)
Onset 2 nights ago left sided chest pain radiating to left arm and shortness of breath.  Pt has took whole bottle of Tums with no relief.

## 2016-06-01 ENCOUNTER — Observation Stay (HOSPITAL_COMMUNITY)
Admission: EM | Admit: 2016-06-01 | Discharge: 2016-06-03 | Disposition: A | Discharge status: Home / Self Care | Payer: PPO | Attending: Internal Medicine | Admitting: Internal Medicine

## 2016-06-01 ENCOUNTER — Encounter (HOSPITAL_COMMUNITY): Payer: Self-pay | Admitting: Family Medicine

## 2016-06-01 DIAGNOSIS — I10 Essential (primary) hypertension: Secondary | ICD-10-CM | POA: Diagnosis not present

## 2016-06-01 DIAGNOSIS — Z888 Allergy status to other drugs, medicaments and biological substances status: Secondary | ICD-10-CM | POA: Diagnosis not present

## 2016-06-01 DIAGNOSIS — I200 Unstable angina: Secondary | ICD-10-CM | POA: Diagnosis not present

## 2016-06-01 DIAGNOSIS — Z7902 Long term (current) use of antithrombotics/antiplatelets: Secondary | ICD-10-CM | POA: Diagnosis not present

## 2016-06-01 DIAGNOSIS — I2089 Other forms of angina pectoris: Secondary | ICD-10-CM

## 2016-06-01 DIAGNOSIS — J449 Chronic obstructive pulmonary disease, unspecified: Secondary | ICD-10-CM | POA: Diagnosis not present

## 2016-06-01 DIAGNOSIS — I2510 Atherosclerotic heart disease of native coronary artery without angina pectoris: Secondary | ICD-10-CM | POA: Diagnosis not present

## 2016-06-01 DIAGNOSIS — I25710 Atherosclerosis of autologous vein coronary artery bypass graft(s) with unstable angina pectoris: Secondary | ICD-10-CM | POA: Diagnosis not present

## 2016-06-01 DIAGNOSIS — I739 Peripheral vascular disease, unspecified: Secondary | ICD-10-CM | POA: Diagnosis present

## 2016-06-01 DIAGNOSIS — R079 Chest pain, unspecified: Secondary | ICD-10-CM | POA: Insufficient documentation

## 2016-06-01 DIAGNOSIS — I25110 Atherosclerotic heart disease of native coronary artery with unstable angina pectoris: Secondary | ICD-10-CM | POA: Diagnosis not present

## 2016-06-01 DIAGNOSIS — J441 Chronic obstructive pulmonary disease with (acute) exacerbation: Secondary | ICD-10-CM | POA: Diagnosis not present

## 2016-06-01 DIAGNOSIS — I252 Old myocardial infarction: Secondary | ICD-10-CM | POA: Diagnosis not present

## 2016-06-01 DIAGNOSIS — E785 Hyperlipidemia, unspecified: Secondary | ICD-10-CM | POA: Diagnosis not present

## 2016-06-01 DIAGNOSIS — I70219 Atherosclerosis of native arteries of extremities with intermittent claudication, unspecified extremity: Secondary | ICD-10-CM | POA: Diagnosis not present

## 2016-06-01 DIAGNOSIS — Z7982 Long term (current) use of aspirin: Secondary | ICD-10-CM | POA: Diagnosis not present

## 2016-06-01 DIAGNOSIS — I2583 Coronary atherosclerosis due to lipid rich plaque: Secondary | ICD-10-CM | POA: Diagnosis not present

## 2016-06-01 DIAGNOSIS — Z9861 Coronary angioplasty status: Secondary | ICD-10-CM | POA: Diagnosis not present

## 2016-06-01 DIAGNOSIS — I208 Other forms of angina pectoris: Secondary | ICD-10-CM

## 2016-06-01 DIAGNOSIS — Z951 Presence of aortocoronary bypass graft: Secondary | ICD-10-CM | POA: Diagnosis not present

## 2016-06-01 LAB — BASIC METABOLIC PANEL
Anion gap: 14 (ref 5–15)
BUN: 22 mg/dL — ABNORMAL HIGH (ref 6–20)
CO2: 26 mmol/L (ref 22–32)
Calcium: 12.2 mg/dL — ABNORMAL HIGH (ref 8.9–10.3)
Chloride: 100 mmol/L — ABNORMAL LOW (ref 101–111)
Creatinine, Ser: 1.81 mg/dL — ABNORMAL HIGH (ref 0.61–1.24)
GFR calc Af Amer: 48 mL/min — ABNORMAL LOW (ref >60)
GFR calc non Af Amer: 41 mL/min — ABNORMAL LOW (ref >60)
Glucose, Bld: 165 mg/dL — ABNORMAL HIGH (ref 65–99)
Potassium: 3.6 mmol/L (ref 3.5–5.1)
Sodium: 140 mmol/L (ref 135–145)

## 2016-06-01 LAB — TROPONIN I
Troponin I: 0.03 ng/mL (ref <0.03)
Troponin I: 0.03 ng/mL (ref <0.03)
Troponin I: <0.03 ng/mL (ref <0.03)
Troponin I: <0.03 ng/mL (ref <0.03)

## 2016-06-01 LAB — LIPID PANEL
Cholesterol: 207 mg/dL — ABNORMAL HIGH (ref 0–200)
HDL: 36 mg/dL — ABNORMAL LOW (ref >40)
LDL Cholesterol: 130 mg/dL — ABNORMAL HIGH (ref 0–99)
Total CHOL/HDL Ratio: 5.8 RATIO
Triglycerides: 205 mg/dL — ABNORMAL HIGH (ref <150)
VLDL: 41 mg/dL — ABNORMAL HIGH (ref 0–40)

## 2016-06-01 LAB — MRSA PCR SCREENING: MRSA by PCR: NEGATIVE

## 2016-06-01 MED ORDER — ACETAMINOPHEN 325 MG PO TABS
650.0000 mg | ORAL_TABLET | ORAL | Status: DC | PRN
Start: 2016-06-01 — End: 2016-06-03

## 2016-06-01 MED ORDER — GI COCKTAIL ~~LOC~~
30.0000 mL | Freq: Once | ORAL | Status: AC
  Administered 2016-06-01: 30 mL via ORAL
  Filled 2016-06-01: qty 30

## 2016-06-01 MED ORDER — SODIUM CHLORIDE 0.9 % IV SOLN: 250.0000 mL | INTRAVENOUS | Status: DC | PRN

## 2016-06-01 MED ORDER — ONDANSETRON HCL 4 MG/2ML IJ SOLN: 4.0000 mg | Freq: Four times a day (QID) | INTRAMUSCULAR | Status: DC | PRN

## 2016-06-01 MED ORDER — FENTANYL CITRATE (PF) 100 MCG/2ML IJ SOLN
50.0000 ug | Freq: Once | INTRAMUSCULAR | Status: AC
  Administered 2016-06-01: 50 ug via INTRAVENOUS
  Filled 2016-06-01: qty 2

## 2016-06-01 MED ORDER — NITROGLYCERIN 0.4 MG SL SUBL
0.4000 mg | SUBLINGUAL_TABLET | SUBLINGUAL | Status: DC | PRN
Start: 2016-06-01 — End: 2016-06-03
  Administered 2016-06-01 – 2016-06-02 (×2): 0.4 mg via SUBLINGUAL
  Filled 2016-06-01 (×3): qty 1

## 2016-06-01 MED ORDER — MORPHINE SULFATE (PF) 4 MG/ML IV SOLN
2.0000 mg | INTRAVENOUS | Status: DC | PRN
  Administered 2016-06-01 (×2): 2 mg via INTRAVENOUS
  Filled 2016-06-01 (×2): qty 1

## 2016-06-01 MED ORDER — SODIUM CHLORIDE 0.9 % WEIGHT BASED INFUSION
3.0000 mL/kg/h | INTRAVENOUS | Status: DC
  Administered 2016-06-02: 3 mL/kg/h via INTRAVENOUS

## 2016-06-01 MED ORDER — ASPIRIN 81 MG PO CHEW
81.0000 mg | CHEWABLE_TABLET | ORAL | Status: AC
  Administered 2016-06-02: 81 mg via ORAL
  Filled 2016-06-01: qty 1

## 2016-06-01 MED ORDER — ENOXAPARIN SODIUM 40 MG/0.4ML ~~LOC~~ SOLN
40.0000 mg | Freq: Every day | SUBCUTANEOUS | Status: DC
  Filled 2016-06-01: qty 0.4

## 2016-06-01 MED ORDER — SODIUM CHLORIDE 0.9 % WEIGHT BASED INFUSION
1.0000 mL/kg/h | INTRAVENOUS | Status: DC
  Administered 2016-06-02: 1.001 mL/kg/h via INTRAVENOUS
  Administered 2016-06-02: 1 mL/kg/h via INTRAVENOUS

## 2016-06-01 MED ORDER — SODIUM CHLORIDE 0.9% FLUSH
3.0000 mL | Freq: Two times a day (BID) | INTRAVENOUS | Status: DC
  Administered 2016-06-02: 3 mL via INTRAVENOUS

## 2016-06-01 MED ORDER — HEPARIN (PORCINE) IN NACL 100-0.45 UNIT/ML-% IJ SOLN
1250.0000 [IU]/h | INTRAMUSCULAR | Status: DC
  Administered 2016-06-01: 1250 [IU]/h via INTRAVENOUS
  Filled 2016-06-01 (×2): qty 250

## 2016-06-01 MED ORDER — INFLUENZA VAC SPLIT QUAD 0.5 ML IM SUSY
0.5000 mL | PREFILLED_SYRINGE | INTRAMUSCULAR | Status: AC
  Administered 2016-06-02: 0.5 mL via INTRAMUSCULAR

## 2016-06-01 MED ORDER — HEPARIN BOLUS VIA INFUSION
4000.0000 [IU] | Freq: Once | INTRAVENOUS | Status: AC
  Administered 2016-06-01: 4000 [IU] via INTRAVENOUS
  Filled 2016-06-01: qty 4000

## 2016-06-01 MED ORDER — NITROGLYCERIN 2 % TD OINT
1.0000 [in_us] | TOPICAL_OINTMENT | Freq: Four times a day (QID) | TRANSDERMAL | Status: DC
  Administered 2016-06-01 – 2016-06-03 (×6): 1 [in_us] via TOPICAL
  Filled 2016-06-01 (×2): qty 30

## 2016-06-01 MED ORDER — MORPHINE SULFATE (PF) 4 MG/ML IV SOLN
2.0000 mg | INTRAVENOUS | Status: DC | PRN
  Administered 2016-06-01 – 2016-06-02 (×5): 2 mg via INTRAVENOUS
  Filled 2016-06-01 (×5): qty 1

## 2016-06-01 MED ORDER — ASPIRIN 81 MG PO CHEW
324.0000 mg | CHEWABLE_TABLET | Freq: Once | ORAL | Status: AC
  Administered 2016-06-01: 324 mg via ORAL
  Filled 2016-06-01: qty 4

## 2016-06-01 MED ORDER — SODIUM CHLORIDE 0.9% FLUSH: 3.0000 mL | INTRAVENOUS | Status: DC | PRN

## 2016-06-01 MED ORDER — TRAMADOL HCL 50 MG PO TABS
50.0000 mg | ORAL_TABLET | Freq: Four times a day (QID) | ORAL | Status: DC | PRN
  Administered 2016-06-01: 50 mg via ORAL
  Filled 2016-06-01: qty 1

## 2016-06-01 NOTE — ED Notes (Addendum)
Pt told this EMT that his chest pain felt similar to the other times that he had to have cardiac procedures done. RN notified.

## 2016-06-01 NOTE — Progress Notes (Signed)
ANTICOAGULATION CONSULT NOTE - Initial Consult  Pharmacy Consult for heparin  Indication: chest pain/ACS  Allergies  Allergen Reactions  . Crestor [Rosuvastatin]     Muscle aches.    Patient Measurements: Height: 5\' 9"  (175.3 cm) Weight: 260 lb (117.9 kg) IBW/kg (Calculated) : 70.7 Heparin Dosing Weight: 97kg  Vital Signs: BP: 121/79 (02/04 1430) Pulse Rate: 53 (02/04 1430)  Labs:  Recent Labs  05/31/16 2256 06/01/16 0531 06/01/16 1055  HGB 15.6  --   --   HCT 45.5  --   --   PLT 200  --   --   CREATININE 1.66* 1.81*  --   TROPONINI  --  0.03* 0.03*    Estimated Creatinine Clearance: 59.8 mL/min (by C-G formula based on SCr of 1.81 mg/dL (H)).   Medical History: Past Medical History:  Diagnosis Date  . Acute MI, inferior wall ([place]) 04/2009  . Chest pain   . COPD (chronic obstructive pulmonary disease) ([place])   . Coronary artery disease    STATUS POST CABG 01/2009  . Dyslipidemia   . Hyperlipidemia   . Hypertension   . Orthostatic hypotension   . Syncope and collapse     Medications:  Prescriptions Prior to Admission  Medication Sig Dispense Refill Last Dose  . ADVAIR DISKUS 250-50 MCG/DOSE AEPB Take 2 puffs by mouth daily as needed (for trouble breathing).    unk  . albuterol (PROVENTIL HFA;VENTOLIN HFA) 108 (90 BASE) MCG/ACT inhaler Inhale 2 puffs into the lungs every 4 (four) hours as needed for wheezing or shortness of breath. 1 Inhaler 1 unk  . aspirin EC 81 MG tablet Take 81 mg by mouth every morning.   05/31/2016 at Unknown time  . atorvastatin (LIPITOR) 80 MG tablet TAKE 1 TABLET BY MOUTH EVERY NIGHT AT BEDTIME 30 tablet 3 05/31/2016 at Unknown time  . clopidogrel (PLAVIX) 75 MG tablet Take 1 tablet (75 mg total) by mouth daily. 90 tablet 3 05/31/2016 at Unknown time  . ezetimibe (ZETIA) 10 MG tablet Take 1 tablet (10 mg total) by mouth daily. 90 tablet 3 05/31/2016 at Unknown time  . gabapentin (NEURONTIN) 400 MG capsule Take 2 capsules (800 mg total) by  mouth 2 (two) times daily. (Patient taking differently: Take 800 mg by mouth 2 (two) times daily as needed (pain). ) 120 capsule 6 05/31/2016 at Unknown time  . guaiFENesin-codeine 100-10 MG/5ML syrup Take 5 mLs by mouth every 6 (six) hours as needed. (Patient taking differently: Take 5 mLs by mouth every 6 (six) hours as needed for cough. ) 180 mL 0 unk  . hydrochlorothiazide (HYDRODIURIL) 25 MG tablet TAKE 1 TABLET BY MOUTH EVERY MORNING 30 tablet 3 05/31/2016 at Unknown time  . losartan (COZAAR) 100 MG tablet TAKE 1 TABLET BY MOUTH DAILY 30 tablet 11 05/31/2016 at Unknown time  . potassium chloride (K-DUR,KLOR-CON) 10 MEQ tablet Take 1 tablet (10 mEq total) by mouth daily. 30 tablet 5 05/31/2016 at Unknown time  . tiotropium (SPIRIVA) 18 MCG inhalation capsule Place 1 capsule (18 mcg total) into inhaler and inhale daily. 30 capsule 5 05/31/2016 at Unknown time   Scheduled:  . enoxaparin (LOVENOX) injection  40 mg Subcutaneous Daily  . nitroGLYCERIN  1 inch Topical Q6H    Assessment: 56 yo male with history of CAD and CABG. He is her for CP and pharmacy consulted to dose heparin. Lovenox has been ordered but not given. No anticoagulants noted PTA. Plans noted for cath on 2/4.  Goal of  Therapy:  Heparin level 0.3-0.7 units/ml Monitor platelets by anticoagulation protocol: Yes   Plan:  -Heparin bolus 4000 units IV followed by 1350 units/hr (~ 14 units/kg/hr) -Heparin level in 8 hours and daily wth CBC daily  Hildred Laser, Pharm D 06/01/2016 4:22 PM

## 2016-06-01 NOTE — Progress Notes (Signed)
PROGRESS NOTE    [name]  [id number] DOB: 28-Jul-1960 DOA: 06/01/2016 PCP: Odette Fraction, MD    Brief Narrative:  56 y.o. male with a past medical history significant for CAD s/p 3V CABG in 2010 and PCI x3 2011, PVD s/p iliac stent, and HTN who presents with chest pain.  The patient was in his usual state of health until about 2 days ago developed left-sided chest pain radiating down his left arm, mild in intensity, "like heartburn", and similar to his previous angina related to his myocardial infarctions in 2010 in 2011.  He first noticed this at rest, but it has persisted since then, getting steadily worse, worsening significantly with exertion, having SOB with exertion, and relieving with rest.  He has eaten a whole bottle of TUMS with no relief and tonight he came here  Assessment & Plan:   Principal Problem:   Unstable angina ([place]) Active Problems:   Coronary artery disease due to lipid rich plaque   Essential hypertension   COPD (chronic obstructive pulmonary disease) ([place])   Peripheral vascular disease ([place])   1. Accelerating angina: Angina is typical of his previous ischemic chest pain and and appears to be accelerating.   -Serial troponins neg x1 thus far -Cardiology consulted. Recommendations for heart cath -Aspirin given -Heparin gtt started -Continue statin, Zetia, Plavix, baby aspirin  2. Hypertension:  - Stable at present -Continue losartan, HCTZ  3. COPD:  -Continue Spiriva and Advair -Stable at present  4. Other medications:  -Continue gabapentin as tolerated  DVT prophylaxis: heparin gtt Code Status: Full Family Communication: Pt in room Disposition Plan: Uncertain at this time  Consultants:   Cardiology  Procedures:     Antimicrobials: Anti-infectives    None       Subjective: Still complaining of chest pains  Objective: Vitals:   06/01/16 1430 06/01/16 1448 06/01/16 1500 06/01/16 1600  BP: 121/79 120/86 127/79  138/84  Pulse: (!) 53   (!) 57  Resp: 19   18  Temp:  98 F (36.7 C)    TempSrc:  Oral    SpO2: 96%   100%  Weight:      Height:        Intake/Output Summary (Last 24 hours) at 06/01/16 1634 Last data filed at 06/01/16 1600  Gross per 24 hour  Intake              200 ml  Output              250 ml  Net              -50 ml   Filed Weights   05/31/16 2255  Weight: 117.9 kg (260 lb)    Examination:  General exam: Appears calm and comfortable  Respiratory system: Clear to auscultation. Respiratory effort normal. Cardiovascular system: S1 & S2 heard, RRR. Gastrointestinal system: Abdomen is nondistended, soft and nontender. No organomegaly or masses felt. Normal bowel sounds heard. Central nervous system: Alert and oriented. No focal neurological deficits. Extremities: Symmetric 5 x 5 power. Skin: No rashes, lesions Psychiatry: Judgement and insight appear normal. Mood & affect appropriate.   Data Reviewed: I have personally reviewed following labs and imaging studies  CBC:  Recent Labs Lab 05/31/16 2256  WBC 12.7*  HGB 15.6  HCT 45.5  MCV 84.4  PLT [id number]   Basic Metabolic Panel:  Recent Labs Lab 05/31/16 2256 06/01/16 0531  NA 138 140  K 3.7 3.6  CL 99* 100*  CO2 24 26  GLUCOSE 200* 165*  BUN 19 22*  CREATININE 1.66* 1.81*  CALCIUM 11.8* 12.2*   GFR: Estimated Creatinine Clearance: 59.8 mL/min (by C-G formula based on SCr of 1.81 mg/dL (H)). Liver Function Tests: No results for input(s): AST, ALT, ALKPHOS, BILITOT, PROT, ALBUMIN in the last 168 hours. No results for input(s): LIPASE, AMYLASE in the last 168 hours. No results for input(s): AMMONIA in the last 168 hours. Coagulation Profile: No results for input(s): INR, PROTIME in the last 168 hours. Cardiac Enzymes:  Recent Labs Lab 06/01/16 0531 06/01/16 1055  TROPONINI 0.03* 0.03*   BNP (last 3 results) No results for input(s): PROBNP in the last 8760 hours. HbA1C: No results for input(s):  HGBA1C in the last 72 hours. CBG: No results for input(s): GLUCAP in the last 168 hours. Lipid Profile:  Recent Labs  06/01/16 0532  CHOL 207*  HDL 36*  LDLCALC 130*  TRIG 205*  CHOLHDL 5.8   Thyroid Function Tests: No results for input(s): TSH, T4TOTAL, FREET4, T3FREE, THYROIDAB in the last 72 hours. Anemia Panel: No results for input(s): VITAMINB12, FOLATE, FERRITIN, TIBC, IRON, RETICCTPCT in the last 72 hours. Sepsis Labs: No results for input(s): PROCALCITON, LATICACIDVEN in the last 168 hours.  No results found for this or any previous visit (from the past 240 hour(s)).   Radiology Studies: Dg Chest 2 View  Result Date: 05/31/2016 CLINICAL DATA:  Left-sided chest pain radiating down left arm. Shortness of breath. Epigastric pain and chest tightness. EXAM: CHEST  2 VIEW COMPARISON:  Radiograph 07/06/2012.  CT 05/22/2013 FINDINGS: Patient is post median sternotomy. The cardiomediastinal contours are unchanged, heart at the upper limits of normal in size. The lungs are clear. Pulmonary vasculature is normal. No consolidation, pleural effusion, or pneumothorax. No acute osseous abnormalities are seen. IMPRESSION: No active cardiopulmonary disease. Electronically Signed   By: Jeb Levering M.D.   On: 05/31/2016 23:38    Scheduled Meds: . heparin  4,000 Units Intravenous Once  . nitroGLYCERIN  1 inch Topical Q6H   Continuous Infusions: . heparin       LOS: 0 days   Gittel Mccamish, Orpah Melter, MD [facility] Pager [phone]  If 7PM-7AM, please contact night-coverage [email or web address] Password [facility] 06/01/2016, 4:34 PM

## 2016-06-01 NOTE — ED Provider Notes (Signed)
[facility] DEPT Provider Note   CSN: [id number] Arrival date & time: 05/31/16  2248  By signing my name below, I, Oleh Genin, attest that this documentation has been prepared under the direction and in the presence of Edwin Dada, MD. Electronically Signed: Oleh Genin, Scribe. 06/01/16. 3:44 AM.   History   Chief Complaint Chief Complaint  Patient presents with  . Chest Pain    HPI [name] is a 56 y.o. male with history of HTN, HLD, CAD s/p CABG and PVD who presents to the ED for evaluation of chest pain. This patient states that 2 days ago he initially developed "mild" L sided chest pain which was "sharp and heavy" and radiates down his L arm. Since that time his pain has worsened; presents intermittently, without apparent pattern. He does endorse an exertional component. He pain became severe over the last 3.5 hours. He took sublingual nitro at home which did not help. At interview, he is also reporting dyspnea, nausea, and diaphoresis. He also is reporting "heartburn" which he does not suspect to be his underlying etiology. He believes his current complaints are similar to a prior Mi. He denies any fevers, diarrhea, vomiting, congestion, cough, or peripheral edema.   The history is provided by the patient. No language interpreter was used.    Past Medical History:  Diagnosis Date  . Acute MI, inferior wall ([place]) 04/2009  . Chest pain   . COPD (chronic obstructive pulmonary disease) ([place])   . Coronary artery disease    STATUS POST CABG 01/2009  . Dyslipidemia   . Dyspnea   . Hyperlipidemia   . Hypertension   . Myocardial infarction   . Orthostatic hypotension   . Syncope and collapse     Patient Active Problem List   Diagnosis Date Noted  . Chest pain 06/01/2016  . Medication side effects 06/28/2014  . Influenza with respiratory manifestations 05/24/2013  . COPD exacerbation ([place]) 05/24/2013  . Chest pain at rest 05/22/2013  . Umbilical  hernia [id number]  . Atherosclerosis of native arteries of the extremities with intermittent claudication 10/03/2011  . Pain in limb 10/03/2011  . Painful sternal wire 06/30/2011  . Leg cramps 11/12/2010  . Coronary artery disease   . Acute MI, inferior wall ([place])   . Dyslipidemia   . Hypertension   . Dyspnea   . Orthostatic hypotension   . Syncope and collapse     Past Surgical History:  Procedure Laterality Date  . ANKLE SURGERY     x 2  . CARDIAC CATHETERIZATION  10/19/2009  . COLONOSCOPY  April 2013  . CORONARY ARTERY BYPASS GRAFT  2010  . FOOT SURGERY    . HERNIA REPAIR  01/23/12   umb hernia  . Kidney stimulator    . LEFT HEART CATHETERIZATION WITH CORONARY/GRAFT ANGIOGRAM N/A 07/08/2012   Procedure: LEFT HEART CATHETERIZATION WITH Beatrix Fetters;  Surgeon: Thayer Headings, MD;  Location[facility] C[facility] LAB;  Service: Cardiovascular;  Laterality: N/A;  . PR VEIN BYPASS GRAFT,AORTO-FEM-POP    . STERNAL WIRES REMOVAL  07/08/2011   Procedure: STERNAL WIRES REMOVAL;  Surgeon: Grace Isaac, MD;  Location: [facility];  Service: Open Heart Surgery;  Laterality: N/A;  removal of 4th wire       Home Medications    Prior to Admission medications   Medication Sig Start Date End Date Taking? Authorizing Provider  ADVAIR DISKUS 250-50 MCG/DOSE AEPB Take 2 puffs by mouth daily as needed (for trouble breathing).  10/07/11   Historical Provider, MD  albuterol (PROVENTIL HFA;VENTOLIN HFA) 108 (90 BASE) MCG/ACT inhaler Inhale 2 puffs into the lungs every 4 (four) hours as needed for wheezing or shortness of breath. 05/24/13   Alycia Rossetti, MD  aspirin EC 81 MG tablet Take 81 mg by mouth every morning.    Historical Provider, MD  atorvastatin (LIPITOR) 80 MG tablet TAKE 1 TABLET BY MOUTH EVERY NIGHT AT BEDTIME 09/10/15   Susy Frizzle, MD  azithromycin (ZITHROMAX) 250 MG tablet Take 2 tablets x 1 day, then 1 tab daily for 4 days 05/13/16   Alycia Rossetti, MD  clopidogrel (PLAVIX)  75 MG tablet Take 1 tablet (75 mg total) by mouth daily. 12/11/15   Thayer Headings, MD  ezetimibe (ZETIA) 10 MG tablet Take 1 tablet (10 mg total) by mouth daily. 06/22/14   Susy Frizzle, MD  gabapentin (NEURONTIN) 400 MG capsule Take 2 capsules (800 mg total) by mouth 2 (two) times daily. 12/20/13   Elam Dutch, MD  guaiFENesin-codeine 100-10 MG/5ML syrup Take 5 mLs by mouth every 6 (six) hours as needed. 05/13/16   Alycia Rossetti, MD  hydrochlorothiazide (HYDRODIURIL) 25 MG tablet TAKE 1 TABLET BY MOUTH EVERY MORNING 09/10/15   Susy Frizzle, MD  losartan (COZAAR) 100 MG tablet TAKE 1 TABLET BY MOUTH DAILY 12/11/15   Thayer Headings, MD  potassium chloride (K-DUR,KLOR-CON) 10 MEQ tablet Take 1 tablet (10 mEq total) by mouth daily. 02/16/15   Thayer Headings, MD  predniSONE (DELTASONE) 20 MG tablet Take 2 tablets (40 mg total) by mouth daily with breakfast. 05/13/16   Alycia Rossetti, MD  sulfacetaminde-prednisoLONE [facility] ophthalmic ointment Place 1 application into the left eye 4 (four) times daily. 10/31/15   Lonie Peak Dixon, PA-C  tiotropium (SPIRIVA) 18 MCG inhalation capsule Place 1 capsule (18 mcg total) into inhaler and inhale daily. 01/20/13   Susy Frizzle, MD    Family History Family History  Problem Relation Age of Onset  . Coronary artery disease Father   . Diabetes Father   . Hyperlipidemia Father   . Heart attack Father   . Heart disease Father     before age 23  . Hypertension Father   . Cancer Mother     uterine  . Hyperlipidemia Mother   . Diabetes Mother   . Hypertension Mother   . Hypertension Brother     Social History Social History  Substance Use Topics  . Smoking status: Former Smoker    Packs/day: 3.00    Years: 30.00    Types: Cigarettes, Cigars    Quit date: 01/26/2009  . Smokeless tobacco: Never Used  . Alcohol use No     Allergies   Crestor [rosuvastatin]   Review of Systems Review of Systems 10 systems reviewed and all  are negative for acute change except as noted in the HPI.  Physical Exam Updated Vital Signs BP 117/69   Pulse 60   Temp 98.6 F (37 C) (Oral)   Resp 19   Ht 5\' 9"  (1.753 m)   Wt 260 lb (117.9 kg)   SpO2 97%   BMI 38.40 kg/m   Physical Exam  Constitutional: He is oriented to person, place, and time. He appears well-developed and well-nourished. No distress.  HENT:  Head: Normocephalic and atraumatic.  Nose: Nose normal.  Eyes: Conjunctivae and EOM are normal. Pupils are equal, round, and reactive to light. Right eye exhibits no  discharge. Left eye exhibits no discharge. No scleral icterus.  Neck: Normal range of motion. Neck supple.  Cardiovascular: Normal rate, regular rhythm and normal heart sounds.  Exam reveals no gallop and no friction rub.   No murmur heard. Pulmonary/Chest: Effort normal and breath sounds normal. No stridor. No respiratory distress. He has no rales.  Abdominal: Soft. He exhibits no distension. There is no tenderness.  Musculoskeletal: He exhibits no edema or tenderness.  Neurological: He is alert and oriented to person, place, and time.  Skin: Skin is warm and dry. No rash noted. He is not diaphoretic. No erythema.  Psychiatric: He has a normal mood and affect.  Vitals reviewed.      ED Treatments / Results  DIAGNOSTIC STUDIES: Oxygen Saturation is 96 percent on room air which is normal by my interpretation.    COORDINATION OF CARE: 3:34 AM Discussed treatment plan with pt at bedside and pt agreed to plan.  Labs (all labs ordered are listed, but only abnormal results are displayed) Labs Reviewed  BASIC METABOLIC PANEL - Abnormal; Notable for the following:       Result Value   Chloride 99 (*)    Glucose, Bld 200 (*)    Creatinine, Ser 1.66 (*)    Calcium 11.8 (*)    GFR calc non Af Amer 46 (*)    GFR calc Af Amer 53 (*)    All other components within normal limits  CBC - Abnormal; Notable for the following:    WBC 12.7 (*)    All other  components within normal limits  I-STAT TROPOININ, ED    EKG  EKG Interpretation  Date/Time:  Saturday May 31 2016 22:57:25 EST Ventricular Rate:  82 PR Interval:  142 QRS Duration: 88 QT Interval:  366 QTC Calculation: 427 R Axis:   -18 Text Interpretation:  Normal sinus rhythm Inferior infarct , age undetermined Anterior infarct , age undetermined Abnormal ECG No significant change since last tracing Confirmed by [facility] MD, [place] [phone]) on 06/01/2016 3:26:37 AM       Radiology Dg Chest 2 View  Result Date: 05/31/2016 CLINICAL DATA:  Left-sided chest pain radiating down left arm. Shortness of breath. Epigastric pain and chest tightness. EXAM: CHEST  2 VIEW COMPARISON:  Radiograph 07/06/2012.  CT 05/22/2013 FINDINGS: Patient is post median sternotomy. The cardiomediastinal contours are unchanged, heart at the upper limits of normal in size. The lungs are clear. Pulmonary vasculature is normal. No consolidation, pleural effusion, or pneumothorax. No acute osseous abnormalities are seen. IMPRESSION: No active cardiopulmonary disease. Electronically Signed   By: Jeb Levering M.D.   On: 05/31/2016 23:38    Procedures Procedures (including critical care time)  Medications Ordered in ED Medications  nitroGLYCERIN (NITROSTAT) SL tablet 0.4 mg (not administered)  fentaNYL (SUBLIMAZE) injection 50 mcg (50 mcg Intravenous Given 06/01/16 0338)  aspirin chewable tablet 324 mg (324 mg Oral Given 06/01/16 0338)  gi cocktail (Maalox,Lidocaine,Donnatal) (30 mLs Oral Given 06/01/16 [id number])     Initial Impression / Assessment and Plan / ED Course  I have reviewed the triage vital signs and the nursing notes.  Pertinent labs & imaging results that were available during my care of the patient were reviewed by me and considered in my medical decision making (see chart for details).     Typical chest pain. DG without acute ischemic changes. Initial troponin negative. Concerning for unstable  angina in a patient with significant cardiac history. Will require admission for ACS rule out.  Chest x-ray without evidence suggestive of pneumonia, pneumothorax, pneumomediastinum.  No abnormal contour of the mediastinum to suggest dissection. No evidence of acute injuries.  Low suspicion for pulmonary embolism. Presentation not classic for aortic dissection or esophageal perforation.  Medics to hospitalist service for ACS rule out  Final Clinical Impressions(s) / ED Diagnoses   Final diagnoses:  Angina at rest [facility])  I personally performed the services described in this documentation, which was scribed in my presence. The recorded information has been reviewed and is accurate.        Fatima Blank, MD 06/01/16 [phone]

## 2016-06-01 NOTE — H&P (Signed)
History and Physical  Patient Name: [name]     [id number]    DOB: Aug 12, 1960    DOA: 06/01/2016 PCP: Odette Fraction, MD   Patient coming from: Home     Chief Complaint: Chest pain  HPI: [name] is a 56 y.o. male with a past medical history significant for CAD s/p 3V CABG in 2010 and PCI x3 2011, PVD s/p iliac stent, and HTN who presents with chest pain.  The patient was in his usual state of health until about 2 days ago developed left-sided chest pain radiating down his left arm, mild in intensity, "like heartburn", and similar to his previous angina related to his myocardial infarctions in 2010 in 2011.  He first noticed this at rest, but it has persisted since then, getting steadily worse, worsening significantly with exertion, having SOB with exertion, and relieving with rest.  He has eaten a whole bottle of TUMS with no relief and tonight he came here.  ED course: -Afebrile, heart rate 84, respirations pulse ox normal, blood pressure 137/88 -Initial ECG showed normal sinus rhythm and troponin was negative. -Na 138, K 3.7, Cr 1.66 (baseline 1.2-1.4), WBC 12.7, Hgb 15.6 -CXR clear -He was given nitroglycerin, fentanyl, and a GI cocktail with minimal relief, and TRH were asked to evaluate for angina       Review of Systems:  Review of Systems  Constitutional: Negative for chills, fever and malaise/fatigue.  Respiratory: Positive for shortness of breath. Negative for cough and wheezing.   Cardiovascular: Positive for chest pain and leg swelling (chronic). Negative for palpitations and orthopnea.  Gastrointestinal: Positive for heartburn.  All other systems reviewed and are negative.    Past Medical History:  Diagnosis Date  . Acute MI, inferior wall ([place]) 04/2009  . Chest pain   . COPD (chronic obstructive pulmonary disease) ([place])   . Coronary artery disease    STATUS POST CABG 01/2009  . Dyslipidemia   . Dyspnea   . Hyperlipidemia   .  Hypertension   . Myocardial infarction   . Orthostatic hypotension   . Syncope and collapse     Past Surgical History:  Procedure Laterality Date  . ANKLE SURGERY     x 2  . CARDIAC CATHETERIZATION  10/19/2009  . COLONOSCOPY  April 2013  . CORONARY ARTERY BYPASS GRAFT  2010  . FOOT SURGERY    . HERNIA REPAIR  01/23/12   umb hernia  . Kidney stimulator    . LEFT HEART CATHETERIZATION WITH CORONARY/GRAFT ANGIOGRAM N/A 07/08/2012   Procedure: LEFT HEART CATHETERIZATION WITH Beatrix Fetters;  Surgeon: Thayer Headings, MD;  Location: [facility] CATH LAB;  Service: Cardiovascular;  Laterality: N/A;  . PR VEIN BYPASS GRAFT,AORTO-FEM-POP    . STERNAL WIRES REMOVAL  07/08/2011   Procedure: STERNAL WIRES REMOVAL;  Surgeon: Grace Isaac, MD;  Location: [place];  Service: Open Heart Surgery;  Laterality: N/A;  removal of 4th wire    Social History: Patient lives with his wife.  Patient walks unassisted.  He worked for [employer], now retired. Former smoker.  From [place].    Allergies  Allergen Reactions  . Crestor [Rosuvastatin]     Muscle aches.    Family history: family history includes Cancer in his mother; Coronary artery disease in his father; Diabetes in his father and mother; Heart attack in his father; Heart disease in his father; Hyperlipidemia in his father and mother; Hypertension in his brother, father, and mother.  Prior to Admission medications   Medication Sig Start Date End Date Taking? Authorizing Provider  ADVAIR DISKUS 250-50 MCG/DOSE AEPB Take 2 puffs by mouth daily as needed (for trouble breathing).  10/07/11   Historical Provider, MD  albuterol (PROVENTIL HFA;VENTOLIN HFA) 108 (90 BASE) MCG/ACT inhaler Inhale 2 puffs into the lungs every 4 (four) hours as needed for wheezing or shortness of breath. 05/24/13   Alycia Rossetti, MD  aspirin EC 81 MG tablet Take 81 mg by mouth every morning.    Historical Provider, MD  atorvastatin  (LIPITOR) 80 MG tablet TAKE 1 TABLET BY MOUTH EVERY NIGHT AT BEDTIME 09/10/15   Susy Frizzle, MD  azithromycin (ZITHROMAX) 250 MG tablet Take 2 tablets x 1 day, then 1 tab daily for 4 days 05/13/16   Alycia Rossetti, MD  clopidogrel (PLAVIX) 75 MG tablet Take 1 tablet (75 mg total) by mouth daily. 12/11/15   Thayer Headings, MD  ezetimibe (ZETIA) 10 MG tablet Take 1 tablet (10 mg total) by mouth daily. 06/22/14   Susy Frizzle, MD  gabapentin (NEURONTIN) 400 MG capsule Take 2 capsules (800 mg total) by mouth 2 (two) times daily. 12/20/13   Elam Dutch, MD  guaiFENesin-codeine 100-10 MG/5ML syrup Take 5 mLs by mouth every 6 (six) hours as needed. 05/13/16   Alycia Rossetti, MD  hydrochlorothiazide (HYDRODIURIL) 25 MG tablet TAKE 1 TABLET BY MOUTH EVERY MORNING 09/10/15   Susy Frizzle, MD  losartan (COZAAR) 100 MG tablet TAKE 1 TABLET BY MOUTH DAILY 12/11/15   Thayer Headings, MD  potassium chloride (K-DUR,KLOR-CON) 10 MEQ tablet Take 1 tablet (10 mEq total) by mouth daily. 02/16/15   Thayer Headings, MD  predniSONE (DELTASONE) 20 MG tablet Take 2 tablets (40 mg total) by mouth daily with breakfast. 05/13/16   Alycia Rossetti, MD  sulfacetaminde-prednisoLONE [facility] ophthalmic ointment Place 1 application into the left eye 4 (four) times daily. 10/31/15   Lonie Peak Dixon, PA-C  tiotropium (SPIRIVA) 18 MCG inhalation capsule Place 1 capsule (18 mcg total) into inhaler and inhale daily. 01/20/13   Susy Frizzle, MD       Physical Exam: BP 117/69   Pulse 60   Temp 98.6 F (37 C) (Oral)   Resp 19   Ht 5\' 9"  (1.753 m)   Wt 117.9 kg (260 lb)   SpO2 97%   BMI 38.40 kg/m  General appearance: Well-developed, adult male, alert and in no acute distress.   Eyes: Anicteric, conjunctiva pink, lids and lashes normal.     ENT: No nasal deformity, discharge, or epistaxis.  OP moist without lesions.   Skin: Warm and dry.   Cardiac: RRR, nl S1-S2, no murmurs appreciated.  Capillary  refill is brisk.  JVP normal.  No LE edema.  Radial and DP pulses 2+ and symmetric.  No carotid bruits. Respiratory: Normal respiratory rate and rhythm.  CTAB without rales or wheezes. GI: Abdomen soft without rigidity.  No TTP. No ascites, distension.   MSK: No deformities or effusions.   Pain not reproduced with palpation of precordium.  No pain with arm movement. Neuro: Sensorium intact and responding to questions, attention normal.  Speech is fluent.  Moves all extremities equally and with normal coordination.    Psych: Behavior appropriate.  Affect normal.  No evidence of aural or visual hallucinations or delusions.       Labs on Admission:  The metabolic panel shows slightly increased Cr from  baseline, otherwise normal electrolytes. The complete blood count shows mild leukocytosis, normal Hgb, platelets. The initial troponin is negative.  Radiological Exams on Admission: Personally reviewed personally erviewed: Dg Chest 2 View  Result Date: 05/31/2016 CLINICAL DATA:  Left-sided chest pain radiating down left arm. Shortness of breath. Epigastric pain and chest tightness. EXAM: CHEST  2 VIEW COMPARISON:  Radiograph 07/06/2012.  CT 05/22/2013 FINDINGS: Patient is post median sternotomy. The cardiomediastinal contours are unchanged, heart at the upper limits of normal in size. The lungs are clear. Pulmonary vasculature is normal. No consolidation, pleural effusion, or pneumothorax. No acute osseous abnormalities are seen. IMPRESSION: No active cardiopulmonary disease. Electronically Signed   By: Jeb Levering M.D.   On: 05/31/2016 23:38    EKG: Independently reviewed. Rate 82, QTc 427, nonspecific T wave changes.    Assessment/Plan  1. Accelerating angina: Angina is typical of his previous ischemic chest pain and and appears to be accelerating.  Other potential causes of chest pain (PE, dissection, pancreatitis, pneumonia/effusion, pericarditis) are doubted.  We have been asked to  admit the patient for observation and etiology consultation with Cardiology tomorrow.  -Serial troponins are ordered -Telemetry -Consult to cardiology, appreciate recommendations -Aspirin given -Currently HR 50s, will hold BB -Will discuss heparin with cardiology -Continue statin, Zetia, Plavix, baby aspirin  2. Hypertension:  Well controlled. -Continue losartan, HCTZ  3. COPD:  Inactive. -Continue Spiriva and Advair  4. Other medications:  -Continue gabapentin            DVT prophylaxis: Lovenox Diet: NPO Code Status: FULL  Family Communication: None present  Disposition Plan: Anticipate overnight observation for arrhythmia on telemetry, serial troponins and subsequent risk stratification by Cardiology.   Admission status: Stepdown, OBS   Medical decision making: Patient seen at 4:33 AM on 06/01/2016.  The patient was discussed with Dr. Leonette Monarch. What exists of the patient's chart was reviewed in depth.  Clinical condition: stable.      Edwin Dada [facility] Pager [phone]

## 2016-06-01 NOTE — Progress Notes (Signed)
2050 Patient c/o being diaphoretic. Chest pain still 8/10. Continues to complain of SOB at rest as well and severe heart burn. Will perform another 12 lead EKG and then notify cardiology. 2130: 12 Lead EKG results: Abnormal EKG, Inferior infarct, age undetermined. Cannot rule out anterior infarct, age undetermined. Dr. Wynonia Lawman (cardiologist on call ) paged.  2145: Morphine 2 mg and Nitro SL 0.4mg  given. Will continue to monitor. 2200: Second page put into [place]: Patient states his chest pain is now a 5/10, no change in his breathing, no longer sweating. States overall "it helped a lot" 2218: Called answering service asking them to page Dr. Wynonia Lawman in case my previous pages weren't going through. 2222: Dr. Wynonia Lawman returned call. Explained above events. States he will look at EKG. 2230: Dr.Tilley asked for the EKG to be repeated d/t leads were backwards and to draw another troponin. 2245: EKG completed. 2330: Troponin resulted 0.03. Patient states pain is still 5/10, no sweating noted right now.

## 2016-06-01 NOTE — Consult Note (Signed)
CARDIOLOGY CONSULT NOTE  Patient ID: [name] MRN: [id number] DOB/AGE: July 25, 1960 56 y.o.  Admit date: 06/01/2016 Primary Physician Odette Fraction, MD Primary Cardiologist Dr. Acie Fredrickson Chief Complaint  Chest pain Requesting  Dr. Loleta Books  HPI:   The patient has a history of CAD/CABG, PVD, dyslipidemia, HTN.  He reports that he has had chest pain off and on since Thursday.  It was constant since 10 PM last night.  He says it is "sharp like somebody sitting on me" and similar to his previous angina.  It is under his left breast and radiates to his left arm.  He has had no relief with NTG.  He as also been treated with morphine, fentanyl, GI cocktail, ASA, and tramadol without relief.  He describes SOB and nausea.  The pain is not positional.  He has found nothing to really help   In the ED troponin was minimally elevated x 2 at 0.03.   EKG demonstrates no acute changes.   The last cath that I can find seems to be in 2014.  This demonstrated LAD 50 - 60%, om 70%, RI occluded,  RCA stent patent with proximal 30 - 40% stenosis,   LIMA to LAD patent, SVG to RCA occluded, SVG to RI patent, SVG to OM patent.  The last perfusion study that I see was negative in 2014.    Past Medical History:  Diagnosis Date  . Acute MI, inferior wall ([place]) 04/2009  . Chest pain   . COPD (chronic obstructive pulmonary disease) ([place])   . Coronary artery disease    STATUS POST CABG 01/2009  . Dyslipidemia   . Dyspnea   . Hyperlipidemia   . Hypertension   . Myocardial infarction   . Orthostatic hypotension   . Syncope and collapse     Past Surgical History:  Procedure Laterality Date  . ANKLE SURGERY     x 2  . CARDIAC CATHETERIZATION  10/19/2009  . COLONOSCOPY  April 2013  . CORONARY ANGIOPLASTY    . CORONARY ARTERY BYPASS GRAFT  2010  . FOOT SURGERY    . HERNIA REPAIR  01/23/12   umb hernia  . Kidney stimulator    . LEFT HEART CATHETERIZATION WITH CORONARY/GRAFT ANGIOGRAM N/A 07/08/2012   Procedure: LEFT HEART CATHETERIZATION WITH Beatrix Fetters;  Surgeon: Thayer Headings, MD;  Location: [facility] CATH LAB;  Service: Cardiovascular;  Laterality: N/A;  . PR VEIN BYPASS GRAFT,AORTO-FEM-POP    . STERNAL WIRES REMOVAL  07/08/2011   Procedure: STERNAL WIRES REMOVAL;  Surgeon: Grace Isaac, MD;  Location: [place];  Service: Open Heart Surgery;  Laterality: N/A;  removal of 4th wire    Allergies  Allergen Reactions  . Crestor [Rosuvastatin]     Muscle aches.   Prescriptions Prior to Admission  Medication Sig Dispense Refill Last Dose  . ADVAIR DISKUS 250-50 MCG/DOSE AEPB Take 2 puffs by mouth daily as needed (for trouble breathing).    unk  . albuterol (PROVENTIL HFA;VENTOLIN HFA) 108 (90 BASE) MCG/ACT inhaler Inhale 2 puffs into the lungs every 4 (four) hours as needed for wheezing or shortness of breath. 1 Inhaler 1 unk  . aspirin EC 81 MG tablet Take 81 mg by mouth every morning.   05/31/2016 at Unknown time  . atorvastatin (LIPITOR) 80 MG tablet TAKE 1 TABLET BY MOUTH EVERY NIGHT AT BEDTIME 30 tablet 3 05/31/2016 at Unknown time  . clopidogrel (PLAVIX) 75 MG tablet Take 1 tablet (75 mg total) by mouth  daily. 90 tablet 3 05/31/2016 at Unknown time  . ezetimibe (ZETIA) 10 MG tablet Take 1 tablet (10 mg total) by mouth daily. 90 tablet 3 05/31/2016 at Unknown time  . gabapentin (NEURONTIN) 400 MG capsule Take 2 capsules (800 mg total) by mouth 2 (two) times daily. (Patient taking differently: Take 800 mg by mouth 2 (two) times daily as needed (pain). ) 120 capsule 6 05/31/2016 at Unknown time  . guaiFENesin-codeine 100-10 MG/5ML syrup Take 5 mLs by mouth every 6 (six) hours as needed. (Patient taking differently: Take 5 mLs by mouth every 6 (six) hours as needed for cough. ) 180 mL 0 unk  . hydrochlorothiazide (HYDRODIURIL) 25 MG tablet TAKE 1 TABLET BY MOUTH EVERY MORNING 30 tablet 3 05/31/2016 at Unknown time  . losartan (COZAAR) 100 MG tablet TAKE 1 TABLET BY MOUTH DAILY 30 tablet 11  05/31/2016 at Unknown time  . potassium chloride (K-DUR,KLOR-CON) 10 MEQ tablet Take 1 tablet (10 mEq total) by mouth daily. 30 tablet 5 05/31/2016 at Unknown time  . tiotropium (SPIRIVA) 18 MCG inhalation capsule Place 1 capsule (18 mcg total) into inhaler and inhale daily. 30 capsule 5 05/31/2016 at Unknown time   Family History  Problem Relation Age of Onset  . Coronary artery disease Father   . Diabetes Father   . Hyperlipidemia Father   . Heart attack Father   . Heart disease Father     before age 20  . Hypertension Father   . Cancer Mother     uterine  . Hyperlipidemia Mother   . Diabetes Mother   . Hypertension Mother   . Hypertension Brother     Social History   Social History  . Marital status: Married    Spouse name: N/A  . Number of children: N/A  . Years of education: N/A   Occupational History  . Not on file.   Social History Main Topics  . Smoking status: Former Smoker    Packs/day: 3.00    Years: 30.00    Types: Cigarettes, Cigars    Quit date: 01/26/2009  . Smokeless tobacco: Never Used  . Alcohol use No  . Drug use: No  . Sexual activity: Not Currently   Other Topics Concern  . Not on file   Social History Narrative  . No narrative on file     ROS:  As stated in the HPI and negative for all other systems.  As stated in the HPI and negative for all other systems.  Physical Exam: Blood pressure 121/79, pulse (!) 53, temperature 98.6 F (37 C), temperature source Oral, resp. rate 19, height 5\' 9"  (1.753 m), weight 260 lb (117.9 kg), SpO2 96 %.  GENERAL:  Well appearing HEENT:  Pupils equal round and reactive, fundi not visualized, oral mucosa unremarkable NECK:  No jugular venous distention, waveform within normal limits, carotid upstroke brisk and symmetric, no bruits, no thyromegaly LYMPHATICS:  No cervical, inguinal adenopathy LUNGS:  Clear to auscultation bilaterally BACK:  No CVA tenderness CHEST:  Well healed sternotomy scar. HEART:  PMI not  displaced or sustained,S1 and S2 within normal limits, no S3, no S4, no clicks, no rubs, no murmurs ABD:  Flat, positive bowel sounds normal in frequency in pitch, no bruits, no rebound, no guarding, no midline pulsatile mass, no hepatomegaly, no splenomegaly EXT:  2 plus pulses throughout, no edema, no cyanosis no clubbing SKIN:  No rashes no nodules NEURO:  Cranial nerves II through XII grossly intact, motor grossly intact  [facility]out [facility]:  Cognitively intact, oriented to person place and time   Labs: Lab Results  Component Value Date   BUN 22 (H) 06/01/2016   Lab Results  Component Value Date   CREATININE 1.81 (H) 06/01/2016   Lab Results  Component Value Date   NA 140 06/01/2016   K 3.6 06/01/2016   CL 100 (L) 06/01/2016   CO2 26 06/01/2016   Lab Results  Component Value Date   TROPONINI 0.03 (HH) 06/01/2016   Lab Results  Component Value Date   WBC 12.7 (H) 05/31/2016   HGB 15.6 05/31/2016   HCT 45.5 05/31/2016   MCV 84.4 05/31/2016   PLT 200 05/31/2016   Lab Results  Component Value Date   CHOL 207 (H) 06/01/2016   HDL 36 (L) 06/01/2016   LDLCALC 130 (H) 06/01/2016   TRIG 205 (H) 06/01/2016   CHOLHDL 5.8 06/01/2016    Radiology:   CXR: Patient is post median sternotomy. The cardiomediastinal contours are unchanged, heart at the upper limits of normal in size. The lungs are clear. Pulmonary vasculature is normal. No consolidation, pleural effusion, or pneumothorax. No acute osseous abnormalities are seen.  EKG:   NSR, rate 53, no acute ST T wave changes.  06/01/2016    ASSESSMENT AND PLAN:   CHEST PAIN:  Similar to previous unstable angina.  No significant objective evidence of ischemia.  However, his pain is intractable.  Cardiac cath will be indicated.  The patient understands that risks included but are not limited to stroke (1 in 1000), death (1 in 79), kidney failure [usually temporary] (1 in 500), bleeding (1 in 200), allergic reaction [possibly  serious] (1 in 200).  The patient understands and agrees to proceed.   I will given NTG paste and heparin.    CKD:   Creat is elevated above the baseline.    Hydrate overnight.  Check creat before cath.  Limit dye.   DYSLIPIDEMIA:    On high dose statin and Zetia.  Consider PCSK9.  Would refer to lipid clinic at discharge.    SignedMinus Breeding 06/01/2016, 3:15 PM

## 2016-06-01 NOTE — ED Notes (Signed)
Chest rise and fall noted.

## 2016-06-01 NOTE — ED Notes (Signed)
Lobby updated on wait time and delays in rooming, warm blankets offered

## 2016-06-01 NOTE — ED Notes (Signed)
Pt up to desk.  Asking for wait time.  States he will leave and go to [facility] if he does not get roomed soon.  This RN explained wait time and delays, patient states "I'll give you just a little more time and then I am leaving and I'm not going to pay because you all have not done sh*it for me"

## 2016-06-01 NOTE — ED Notes (Signed)
Gave pt.a Legrand Como

## 2016-06-02 ENCOUNTER — Encounter (HOSPITAL_COMMUNITY)
Admission: EM | Disposition: A | Discharge status: Home / Self Care | Payer: Self-pay | Source: Home / Self Care | Attending: Family Medicine

## 2016-06-02 ENCOUNTER — Encounter (HOSPITAL_COMMUNITY): Payer: Self-pay | Admitting: Cardiovascular Disease

## 2016-06-02 DIAGNOSIS — I739 Peripheral vascular disease, unspecified: Secondary | ICD-10-CM

## 2016-06-02 DIAGNOSIS — I208 Other forms of angina pectoris: Secondary | ICD-10-CM

## 2016-06-02 DIAGNOSIS — J449 Chronic obstructive pulmonary disease, unspecified: Secondary | ICD-10-CM | POA: Diagnosis not present

## 2016-06-02 DIAGNOSIS — I200 Unstable angina: Secondary | ICD-10-CM | POA: Diagnosis not present

## 2016-06-02 DIAGNOSIS — Z7902 Long term (current) use of antithrombotics/antiplatelets: Secondary | ICD-10-CM | POA: Diagnosis not present

## 2016-06-02 DIAGNOSIS — I25110 Atherosclerotic heart disease of native coronary artery with unstable angina pectoris: Secondary | ICD-10-CM | POA: Diagnosis not present

## 2016-06-02 DIAGNOSIS — Z9861 Coronary angioplasty status: Secondary | ICD-10-CM | POA: Diagnosis not present

## 2016-06-02 DIAGNOSIS — I70219 Atherosclerosis of native arteries of extremities with intermittent claudication, unspecified extremity: Secondary | ICD-10-CM | POA: Diagnosis not present

## 2016-06-02 DIAGNOSIS — Z951 Presence of aortocoronary bypass graft: Secondary | ICD-10-CM | POA: Diagnosis not present

## 2016-06-02 DIAGNOSIS — I2583 Coronary atherosclerosis due to lipid rich plaque: Secondary | ICD-10-CM | POA: Diagnosis not present

## 2016-06-02 DIAGNOSIS — J441 Chronic obstructive pulmonary disease with (acute) exacerbation: Secondary | ICD-10-CM | POA: Diagnosis not present

## 2016-06-02 DIAGNOSIS — I10 Essential (primary) hypertension: Secondary | ICD-10-CM | POA: Diagnosis not present

## 2016-06-02 DIAGNOSIS — I25710 Atherosclerosis of autologous vein coronary artery bypass graft(s) with unstable angina pectoris: Secondary | ICD-10-CM | POA: Diagnosis not present

## 2016-06-02 DIAGNOSIS — Z888 Allergy status to other drugs, medicaments and biological substances status: Secondary | ICD-10-CM | POA: Diagnosis not present

## 2016-06-02 DIAGNOSIS — Z7982 Long term (current) use of aspirin: Secondary | ICD-10-CM | POA: Diagnosis not present

## 2016-06-02 DIAGNOSIS — I252 Old myocardial infarction: Secondary | ICD-10-CM | POA: Diagnosis not present

## 2016-06-02 DIAGNOSIS — E785 Hyperlipidemia, unspecified: Secondary | ICD-10-CM | POA: Diagnosis not present

## 2016-06-02 HISTORY — PX: LEFT HEART CATH AND CORS/GRAFTS ANGIOGRAPHY: CATH118250

## 2016-06-02 LAB — BASIC METABOLIC PANEL
Anion gap: 9 (ref 5–15)
BUN: 23 mg/dL — ABNORMAL HIGH (ref 6–20)
CO2: 27 mmol/L (ref 22–32)
Calcium: 9.4 mg/dL (ref 8.9–10.3)
Chloride: 103 mmol/L (ref 101–111)
Creatinine, Ser: 1.51 mg/dL — ABNORMAL HIGH (ref 0.61–1.24)
GFR calc Af Amer: 59 mL/min — ABNORMAL LOW (ref >60)
GFR calc non Af Amer: 51 mL/min — ABNORMAL LOW (ref >60)
Glucose, Bld: 131 mg/dL — ABNORMAL HIGH (ref 65–99)
Potassium: 3.8 mmol/L (ref 3.5–5.1)
Sodium: 139 mmol/L (ref 135–145)

## 2016-06-02 LAB — PROTIME-INR
INR: 1.02
Prothrombin Time: 13.4 seconds (ref 11.4–15.2)

## 2016-06-02 LAB — CBC
HCT: 42.5 % (ref 39.0–52.0)
Hemoglobin: 13.6 g/dL (ref 13.0–17.0)
MCH: 27.9 pg (ref 26.0–34.0)
MCHC: 32 g/dL (ref 30.0–36.0)
MCV: 87.3 fL (ref 78.0–100.0)
Platelets: 157 10*3/uL (ref 150–400)
RBC: 4.87 MIL/uL (ref 4.22–5.81)
RDW: 13.7 % (ref 11.5–15.5)
WBC: 5.9 10*3/uL (ref 4.0–10.5)

## 2016-06-02 LAB — HEPARIN LEVEL (UNFRACTIONATED)
Heparin Unfractionated: 0.55 IU/mL (ref 0.30–0.70)
Heparin Unfractionated: 0.46 IU/mL (ref 0.30–0.70)

## 2016-06-02 SURGERY — LEFT HEART CATH AND CORS/GRAFTS ANGIOGRAPHY
Anesthesia: LOCAL

## 2016-06-02 MED ORDER — CLOPIDOGREL BISULFATE 75 MG PO TABS
75.0000 mg | ORAL_TABLET | Freq: Every day | ORAL | Status: DC
  Administered 2016-06-02 – 2016-06-03 (×2): 75 mg via ORAL
  Filled 2016-06-02 (×2): qty 1

## 2016-06-02 MED ORDER — SODIUM CHLORIDE 0.9% FLUSH: 3.0000 mL | INTRAVENOUS | Status: DC | PRN

## 2016-06-02 MED ORDER — ASPIRIN 81 MG PO CHEW: 81.0000 mg | CHEWABLE_TABLET | Freq: Every day | ORAL | Status: DC

## 2016-06-02 MED ORDER — MIDAZOLAM HCL 2 MG/2ML IJ SOLN
INTRAMUSCULAR | Status: DC | PRN
  Administered 2016-06-02: 1 mg via INTRAVENOUS

## 2016-06-02 MED ORDER — FENTANYL CITRATE (PF) 100 MCG/2ML IJ SOLN
INTRAMUSCULAR | Status: AC
  Filled 2016-06-02: qty 2

## 2016-06-02 MED ORDER — MORPHINE SULFATE (PF) 2 MG/ML IV SOLN
2.0000 mg | INTRAVENOUS | Status: DC | PRN
  Administered 2016-06-02 – 2016-06-03 (×3): 2 mg via INTRAVENOUS
  Filled 2016-06-02 (×3): qty 1

## 2016-06-02 MED ORDER — MOMETASONE FURO-FORMOTEROL FUM 200-5 MCG/ACT IN AERO
2.0000 | INHALATION_SPRAY | Freq: Two times a day (BID) | RESPIRATORY_TRACT | Status: DC
  Administered 2016-06-02 – 2016-06-03 (×3): 2 via RESPIRATORY_TRACT
  Filled 2016-06-02 (×2): qty 8.8

## 2016-06-02 MED ORDER — CLOPIDOGREL BISULFATE 75 MG PO TABS: 75.0000 mg | ORAL_TABLET | Freq: Every day | ORAL | Status: DC

## 2016-06-02 MED ORDER — HEPARIN (PORCINE) IN NACL 2-0.9 UNIT/ML-% IJ SOLN
INTRAMUSCULAR | Status: DC | PRN
  Administered 2016-06-02: 1000 mL

## 2016-06-02 MED ORDER — ACETAMINOPHEN 325 MG PO TABS: 650.0000 mg | ORAL_TABLET | ORAL | Status: DC | PRN

## 2016-06-02 MED ORDER — IOPAMIDOL (ISOVUE-370) INJECTION 76%
INTRAVENOUS | Status: AC
  Filled 2016-06-02: qty 125

## 2016-06-02 MED ORDER — LOSARTAN POTASSIUM 50 MG PO TABS
100.0000 mg | ORAL_TABLET | Freq: Every day | ORAL | Status: DC
  Administered 2016-06-02 – 2016-06-03 (×2): 100 mg via ORAL
  Filled 2016-06-02 (×2): qty 2

## 2016-06-02 MED ORDER — GABAPENTIN 400 MG PO CAPS
800.0000 mg | ORAL_CAPSULE | Freq: Two times a day (BID) | ORAL | Status: DC
  Administered 2016-06-02 – 2016-06-03 (×3): 800 mg via ORAL
  Filled 2016-06-02 (×3): qty 2

## 2016-06-02 MED ORDER — LIDOCAINE HCL (PF) 1 % IJ SOLN
INTRAMUSCULAR | Status: AC
  Filled 2016-06-02: qty 30

## 2016-06-02 MED ORDER — MIDAZOLAM HCL 2 MG/2ML IJ SOLN
INTRAMUSCULAR | Status: AC
  Filled 2016-06-02: qty 2

## 2016-06-02 MED ORDER — SODIUM CHLORIDE 0.9 % IV SOLN: INTRAVENOUS | Status: AC

## 2016-06-02 MED ORDER — ATORVASTATIN CALCIUM 80 MG PO TABS
80.0000 mg | ORAL_TABLET | Freq: Every day | ORAL | Status: DC
  Administered 2016-06-02: 80 mg via ORAL
  Filled 2016-06-02: qty 1

## 2016-06-02 MED ORDER — LIDOCAINE HCL (PF) 1 % IJ SOLN
INTRAMUSCULAR | Status: DC | PRN
  Administered 2016-06-02: 20 mL via SUBCUTANEOUS

## 2016-06-02 MED ORDER — SODIUM CHLORIDE 0.9 % IV SOLN: 250.0000 mL | INTRAVENOUS | Status: DC | PRN

## 2016-06-02 MED ORDER — IOPAMIDOL (ISOVUE-370) INJECTION 76%
INTRAVENOUS | Status: DC | PRN
  Administered 2016-06-02: 85 mL via INTRA_ARTERIAL

## 2016-06-02 MED ORDER — SODIUM CHLORIDE 0.9% FLUSH: 3.0000 mL | Freq: Two times a day (BID) | INTRAVENOUS | Status: DC

## 2016-06-02 MED ORDER — EZETIMIBE 10 MG PO TABS
10.0000 mg | ORAL_TABLET | Freq: Every day | ORAL | Status: DC
  Administered 2016-06-02 – 2016-06-03 (×2): 10 mg via ORAL
  Filled 2016-06-02 (×2): qty 1

## 2016-06-02 MED ORDER — ONDANSETRON HCL 4 MG/2ML IJ SOLN: 4.0000 mg | Freq: Four times a day (QID) | INTRAMUSCULAR | Status: DC | PRN

## 2016-06-02 MED ORDER — ASPIRIN EC 81 MG PO TBEC
81.0000 mg | DELAYED_RELEASE_TABLET | Freq: Every day | ORAL | Status: DC
  Administered 2016-06-03: 81 mg via ORAL
  Filled 2016-06-02: qty 1

## 2016-06-02 MED ORDER — HYDROCHLOROTHIAZIDE 25 MG PO TABS
25.0000 mg | ORAL_TABLET | Freq: Every morning | ORAL | Status: DC
  Administered 2016-06-02 – 2016-06-03 (×2): 25 mg via ORAL
  Filled 2016-06-02 (×2): qty 1

## 2016-06-02 MED ORDER — ALBUTEROL SULFATE (2.5 MG/3ML) 0.083% IN NEBU: 3.0000 mL | INHALATION_SOLUTION | RESPIRATORY_TRACT | Status: DC | PRN

## 2016-06-02 MED ORDER — HEPARIN (PORCINE) IN NACL 2-0.9 UNIT/ML-% IJ SOLN
INTRAMUSCULAR | Status: AC
  Filled 2016-06-02: qty 1500

## 2016-06-02 MED ORDER — FENTANYL CITRATE (PF) 100 MCG/2ML IJ SOLN
INTRAMUSCULAR | Status: DC | PRN
  Administered 2016-06-02: 25 ug via INTRAVENOUS

## 2016-06-02 SURGICAL SUPPLY — 9 items
CATH INFINITI 5FR MULTPACK ANG (CATHETERS) ×2 IMPLANT
DEVICE CLOSURE MYNXGRIP 5F (Vascular Products) ×2 IMPLANT
KIT HEART LEFT (KITS) ×2 IMPLANT
PACK CARDIAC CATHETERIZATION (CUSTOM PROCEDURE TRAY) ×2 IMPLANT
SHEATH PINNACLE 5F 10CM (SHEATH) ×2 IMPLANT
SYR MEDRAD MARK V 150ML (SYRINGE) ×2 IMPLANT
TRANSDUCER W/STOPCOCK (MISCELLANEOUS) ×2 IMPLANT
TUBING CIL FLEX 10 FLL-RA (TUBING) ×2 IMPLANT
WIRE EMERALD 3MM-J .035X150CM (WIRE) ×2 IMPLANT

## 2016-06-02 NOTE — Interval H&P Note (Signed)
Cath Lab Visit (complete for each Cath Lab visit)  Clinical Evaluation Leading to the Procedure:   ACS: Yes.    Non-ACS:    Anginal Classification: CCS III  Anti-ischemic medical therapy: Minimal Therapy (1 class of medications)  Non-Invasive Test Results: No non-invasive testing performed  Prior CABG: Previous CABG      History and Physical Interval Note:  06/02/2016 1:39 PM  [name]  has presented today for surgery, with the diagnosis of unstable angina  The various methods of treatment have been discussed with the patient and family. After consideration of risks, benefits and other options for treatment, the patient has consented to  Procedure(s): Left Heart Cath and Cors/Grafts Angiography (N/A) as a surgical intervention .  The patient's history has been reviewed, patient examined, no change in status, stable for surgery.  I have reviewed the patient's chart and labs.  Questions were answered to the patient's satisfaction.     Quay Burow

## 2016-06-02 NOTE — Progress Notes (Signed)
Progress Note  Patient Name: [name] Date of Encounter: 06/02/2016  Primary Cardiologist: Nahser  Subjective   56 yo with hx of CAD, CABG, HTN, HLD. Admitted with angina lke CP - similar to previous The plan is for cath today     Inpatient Medications    Scheduled Meds: . aspirin EC  81 mg Oral Daily  . atorvastatin  80 mg Oral QHS  . clopidogrel  75 mg Oral Daily  . ezetimibe  10 mg Oral Daily  . gabapentin  800 mg Oral BID  . hydrochlorothiazide  25 mg Oral q morning - 10a  . Influenza vac split quadrivalent PF  0.5 mL Intramuscular Tomorrow-1000  . losartan  100 mg Oral Daily  . mometasone-formoterol  2 puff Inhalation BID  . nitroGLYCERIN  1 inch Topical Q6H  . sodium chloride flush  3 mL Intravenous Q12H   Continuous Infusions: . sodium chloride 1 mL/kg/hr (06/02/16 0700)  . heparin 1,250 Units/hr (06/02/16 0000)   PRN Meds: sodium chloride, acetaminophen, albuterol, morphine injection, nitroGLYCERIN, ondansetron (ZOFRAN) IV, sodium chloride flush, traMADol   Vital Signs    Vitals:   06/02/16 0805 06/02/16 0922 06/02/16 1127 06/02/16 1216  BP: 111/77  115/77 133/77  Pulse: (!) 49  (!) 50 (!) 55  Resp: 15  17   Temp: 97.8 F (36.6 C)   97.4 F (36.3 C)  TempSrc: Oral   Oral  SpO2: 98% 100% 98% 99%  Weight:      Height:        Intake/Output Summary (Last 24 hours) at 06/02/16 1258 Last data filed at 06/02/16 1130  Gross per 24 hour  Intake           377.29 ml  Output              500 ml  Net          -122.71 ml   Filed Weights   05/31/16 2255 06/01/16 1600  Weight: 260 lb (117.9 kg) 257 lb 0.9 oz (116.6 kg)    Telemetry     - Personally Reviewed  ECG    NSR  - Personally Reviewed  Physical Exam   GEN: No acute distress.   Neck: No JVD Cardiac: RRR, no murmurs, rubs, or gallops.  Respiratory: Clear to auscultation bilaterally. GI: Soft, nontender, non-distended  MS: No edema; No deformity. Neuro:  Nonfocal  Psych: Normal  affect   Labs    Chemistry Recent Labs Lab 05/31/16 2256 06/01/16 0531 06/02/16 0233  NA 138 140 139  K 3.7 3.6 3.8  CL 99* 100* 103  CO2 24 26 27   GLUCOSE 200* 165* 131*  BUN 19 22* 23*  CREATININE 1.66* 1.81* 1.51*  CALCIUM 11.8* 12.2* 9.4  GFRNONAA 46* 41* 51*  GFRAA 53* 48* 59*  ANIONGAP 15 14 9      Hematology Recent Labs Lab 05/31/16 2256 06/02/16 0233  WBC 12.7* 5.9  RBC 5.39 4.87  HGB 15.6 13.6  HCT 45.5 42.5  MCV 84.4 87.3  MCH 28.9 27.9  MCHC 34.3 32.0  RDW 13.4 13.7  PLT 200 157    Cardiac Enzymes Recent Labs Lab 06/01/16 0531 06/01/16 1055 06/01/16 1602 06/01/16 2237  TROPONINI 0.03* 0.03* <0.03 <0.03    Recent Labs Lab 05/31/16 2313  TROPIPOC 0.04     BNPNo results for input(s): BNP, PROBNP in the last 168 hours.   DDimer No results for input(s): DDIMER in the last 168 hours.  Radiology    Dg Chest 2 View  Result Date: 05/31/2016 CLINICAL DATA:  Left-sided chest pain radiating down left arm. Shortness of breath. Epigastric pain and chest tightness. EXAM: CHEST  2 VIEW COMPARISON:  Radiograph 07/06/2012.  CT 05/22/2013 FINDINGS: Patient is post median sternotomy. The cardiomediastinal contours are unchanged, heart at the upper limits of normal in size. The lungs are clear. Pulmonary vasculature is normal. No consolidation, pleural effusion, or pneumothorax. No acute osseous abnormalities are seen. IMPRESSION: No active cardiopulmonary disease. Electronically Signed   By: Jeb Levering M.D.   On: 05/31/2016 23:38    Cardiac Studies     Patient Profile     56 y.o. male  - hx of CAD .  Here with recurrent CP .    Assessment & Plan    1.  CAD - for cath today . He is aware of risks, benefits, options. Agrees to proceed.   2. HTN _  Continue current meds.     Signed, Mertie Moores, MD  06/02/2016, 12:58 PM

## 2016-06-02 NOTE — Progress Notes (Signed)
[facility] for heparin  Indication: chest pain/ACS  Allergies  Allergen Reactions  . Crestor [Rosuvastatin]     Muscle aches.    Patient Measurements: Height: 5\' 9"  (175.3 cm) Weight: 257 lb 0.9 oz (116.6 kg) IBW/kg (Calculated) : 70.7 Heparin Dosing Weight: 97kg  Vital Signs: Temp: 97.8 F (36.6 C) (02/05 0805) Temp Source: Oral (02/05 0805) BP: 111/77 (02/05 0805) Pulse Rate: 49 (02/05 0805)  Labs:  Recent Labs  05/31/16 2256  06/01/16 0531 06/01/16 1055 06/01/16 1602 06/01/16 2237 06/02/16 0045 06/02/16 0233 06/02/16 0855  HGB 15.6  --   --   --   --   --   --  13.6  --   HCT 45.5  --   --   --   --   --   --  42.5  --   PLT 200  --   --   --   --   --   --  157  --   HEPARINUNFRC  --   --   --   --   --   --  0.55  --  0.46  CREATININE 1.66*  --  1.81*  --   --   --   --  1.51*  --   TROPONINI  --   < > 0.03* 0.03* <0.03 <0.03  --   --   --   < > = values in this interval not displayed.  Estimated Creatinine Clearance: 71.3 mL/min (by C-G formula based on SCr of 1.51 mg/dL (H)).  . sodium chloride 1 mL/kg/hr (06/02/16 0700)  . heparin 1,250 Units/hr (06/02/16 0000)     Assessment: 56 y.o. male with hx CAD admitted with chest pain.  Pharmacy asked to begin IV heparin.  Heparin level at goal on heparin at 1250 units/hr.  No bleeding or complications noted, planning to go to cath later this afternoon.  Goal of Therapy:  Heparin level 0.3-0.7 units/ml Monitor platelets by anticoagulation protocol: Yes   Plan:  Continue Heparin at current rate  F/U after cath  Uvaldo Rising, BCPS  Clinical Pharmacist Pager [phone]  06/02/2016 10:17 AM

## 2016-06-02 NOTE — Progress Notes (Signed)
[facility] for heparin  Indication: chest pain/ACS  Allergies  Allergen Reactions  . Crestor [Rosuvastatin]     Muscle aches.    Patient Measurements: Height: 5\' 9"  (175.3 cm) Weight: 257 lb 0.9 oz (116.6 kg) IBW/kg (Calculated) : 70.7 Heparin Dosing Weight: 97kg  Vital Signs: Temp: 97.7 F (36.5 C) (02/04 2354) Temp Source: Oral (02/04 2354) BP: 104/77 (02/05 0000) Pulse Rate: 57 (02/05 0000)  Labs:  Recent Labs  05/31/16 2256  06/01/16 0531 06/01/16 1055 06/01/16 1602 06/01/16 2237 06/02/16 0045  HGB 15.6  --   --   --   --   --   --   HCT 45.5  --   --   --   --   --   --   PLT 200  --   --   --   --   --   --   HEPARINUNFRC  --   --   --   --   --   --  0.55  CREATININE 1.66*  --  1.81*  --   --   --   --   TROPONINI  --   < > 0.03* 0.03* <0.03 <0.03  --   < > = values in this interval not displayed.  Estimated Creatinine Clearance: 59.5 mL/min (by C-G formula based on SCr of 1.81 mg/dL (H)).   Assessment: 56 y.o. male with chest pain for heparin   Goal of Therapy:  Heparin level 0.3-0.7 units/ml Monitor platelets by anticoagulation protocol: Yes   Plan:  Continue Heparin at current rate  F/U after cath  Phillis Knack, PharmD, BCPS  06/02/2016 2:03 AM

## 2016-06-02 NOTE — Progress Notes (Signed)
PROGRESS NOTE    [name]  [id number] DOB: 02-25-1961 DOA: 06/01/2016 PCP: Odette Fraction, MD    Brief Narrative:  56 y.o. male with a past medical history significant for CAD s/p 3V CABG in 2010 and PCI x3 2011, PVD s/p iliac stent, and HTN who presents with chest pain.  The patient was in his usual state of health until about 2 days ago developed left-sided chest pain radiating down his left arm, mild in intensity, "like heartburn", and similar to his previous angina related to his myocardial infarctions in 2010 in 2011.  He first noticed this at rest, but it has persisted since then, getting steadily worse, worsening significantly with exertion, having SOB with exertion, and relieving with rest.  He has eaten a whole bottle of TUMS with no relief and tonight he came here  Assessment & Plan:   Principal Problem:   Unstable angina ([place]) Active Problems:   Coronary artery disease due to lipid rich plaque   Essential hypertension   COPD (chronic obstructive pulmonary disease) ([place])   Peripheral vascular disease ([place])   Angina at rest ([place])   1. Accelerating angina: Angina is typical of his previous ischemic chest pain and and appears to be accelerating.   -Serial troponins neg x1 thus far -Cardiology was consulted. Pt has since undergone heart cath today -Continued on statin, Zetia, Plavix, baby aspirin  2. Hypertension:  - remains stable at present -Continue losartan, HCTZ  3. COPD:  -Continue Spiriva and Advair - Remains stable at present  4. Other medications:  -Continued gabapentin per home regimen  DVT prophylaxis: heparin gtt Code Status: Full Family Communication: Pt in room Disposition Plan: Possible d/c in 24-48hrs  Consultants:   Cardiology  Procedures:   Heart cath 2/5  Antimicrobials: Anti-infectives    None      Subjective: Reporting chest pains this AM  Objective: Vitals:   06/02/16 1127 06/02/16 1216 06/02/16 1300  06/02/16 1342  BP: 115/77 133/77    Pulse: (!) 50 (!) 55 66   Resp: 17  18   Temp:  97.4 F (36.3 C)    TempSrc:  Oral    SpO2: 98% 99% 98% 99%  Weight:      Height:        Intake/Output Summary (Last 24 hours) at 06/02/16 1429 Last data filed at 06/02/16 1130  Gross per 24 hour  Intake           377.29 ml  Output              500 ml  Net          -122.71 ml   Filed Weights   05/31/16 2255 06/01/16 1600  Weight: 117.9 kg (260 lb) 116.6 kg (257 lb 0.9 oz)    Examination:  General exam: Laying in bed, conversant, in nad Respiratory system: normal chest rise, no audible wheezing Cardiovascular system: regular rate, s1-2 Gastrointestinal system: soft, nondistended, pos BS Central nervous system: no seizures, no tremors Extremities: perfused, no clubbing Skin: Normal skin turgor, no notable skin lesions seen Psychiatry: mood normal// no visual hallucinations.   Data Reviewed: I have personally reviewed following labs and imaging studies  CBC:  Recent Labs Lab 05/31/16 2256 06/02/16 0233  WBC 12.7* 5.9  HGB 15.6 13.6  HCT 45.5 42.5  MCV 84.4 87.3  PLT 200 [id number]   Basic Metabolic Panel:  Recent Labs Lab 05/31/16 2256 06/01/16 0531 06/02/16 0233  NA 138 140 139  K  3.7 3.6 3.8  CL 99* 100* 103  CO2 24 26 27   GLUCOSE 200* 165* 131*  BUN 19 22* 23*  CREATININE 1.66* 1.81* 1.51*  CALCIUM 11.8* 12.2* 9.4   GFR: Estimated Creatinine Clearance: 71.3 mL/min (by C-G formula based on SCr of 1.51 mg/dL (H)). Liver Function Tests: No results for input(s): AST, ALT, ALKPHOS, BILITOT, PROT, ALBUMIN in the last 168 hours. No results for input(s): LIPASE, AMYLASE in the last 168 hours. No results for input(s): AMMONIA in the last 168 hours. Coagulation Profile:  Recent Labs Lab 06/02/16 1117  INR 1.02   Cardiac Enzymes:  Recent Labs Lab 06/01/16 0531 06/01/16 1055 06/01/16 1602 06/01/16 2237  TROPONINI 0.03* 0.03* <0.03 <0.03   BNP (last 3 results) No  results for input(s): PROBNP in the last 8760 hours. HbA1C: No results for input(s): HGBA1C in the last 72 hours. CBG: No results for input(s): GLUCAP in the last 168 hours. Lipid Profile:  Recent Labs  06/01/16 0532  CHOL 207*  HDL 36*  LDLCALC 130*  TRIG 205*  CHOLHDL 5.8   Thyroid Function Tests: No results for input(s): TSH, T4TOTAL, FREET4, T3FREE, THYROIDAB in the last 72 hours. Anemia Panel: No results for input(s): VITAMINB12, FOLATE, FERRITIN, TIBC, IRON, RETICCTPCT in the last 72 hours. Sepsis Labs: No results for input(s): PROCALCITON, LATICACIDVEN in the last 168 hours.  Recent Results (from the past 240 hour(s))  MRSA PCR Screening     Status: None   Collection Time: 06/01/16  3:11 PM  Result Value Ref Range Status   MRSA by PCR NEGATIVE NEGATIVE Final    Comment:        The GeneXpert MRSA Assay (FDA approved for NASAL specimens only), is one component of a comprehensive MRSA colonization surveillance program. It is not intended to diagnose MRSA infection nor to guide or monitor treatment for MRSA infections.      Radiology Studies: Dg Chest 2 View  Result Date: 05/31/2016 CLINICAL DATA:  Left-sided chest pain radiating down left arm. Shortness of breath. Epigastric pain and chest tightness. EXAM: CHEST  2 VIEW COMPARISON:  Radiograph 07/06/2012.  CT 05/22/2013 FINDINGS: Patient is post median sternotomy. The cardiomediastinal contours are unchanged, heart at the upper limits of normal in size. The lungs are clear. Pulmonary vasculature is normal. No consolidation, pleural effusion, or pneumothorax. No acute osseous abnormalities are seen. IMPRESSION: No active cardiopulmonary disease. Electronically Signed   By: Jeb Levering M.D.   On: 05/31/2016 23:38    Scheduled Meds: . [MAR Hold] aspirin EC  81 mg Oral Daily  . [MAR Hold] atorvastatin  80 mg Oral QHS  . [MAR Hold] clopidogrel  75 mg Oral Daily  . [MAR Hold] ezetimibe  10 mg Oral Daily  . [MAR  Hold] gabapentin  800 mg Oral BID  . [MAR Hold] hydrochlorothiazide  25 mg Oral q morning - 10a  . [MAR Hold] Influenza vac split quadrivalent PF  0.5 mL Intramuscular Tomorrow-1000  . [MAR Hold] losartan  100 mg Oral Daily  . [MAR Hold] mometasone-formoterol  2 puff Inhalation BID  . [MAR Hold] nitroGLYCERIN  1 inch Topical Q6H  . sodium chloride flush  3 mL Intravenous Q12H   Continuous Infusions: . sodium chloride 1.001 mL/kg/hr (06/02/16 1305)  . heparin 1,250 Units/hr (06/02/16 0000)  . heparin       LOS: 0 days   Kinaya Hilliker, Orpah Melter, MD [facility] Pager [phone]  If 7PM-7AM, please contact night-coverage [email or web address] Password [email or web address] 06/02/2016, 2:29  PM    

## 2016-06-02 NOTE — H&P (View-Only) (Signed)
Progress Note  Patient Name: [name] Date of Encounter: 06/02/2016  Primary Cardiologist: Kenson Groh  Subjective   56 yo with hx of CAD, CABG, HTN, HLD. Admitted with angina lke CP - similar to previous The plan is for cath today     Inpatient Medications    Scheduled Meds: . aspirin EC  81 mg Oral Daily  . atorvastatin  80 mg Oral QHS  . clopidogrel  75 mg Oral Daily  . ezetimibe  10 mg Oral Daily  . gabapentin  800 mg Oral BID  . hydrochlorothiazide  25 mg Oral q morning - 10a  . Influenza vac split quadrivalent PF  0.5 mL Intramuscular Tomorrow-1000  . losartan  100 mg Oral Daily  . mometasone-formoterol  2 puff Inhalation BID  . nitroGLYCERIN  1 inch Topical Q6H  . sodium chloride flush  3 mL Intravenous Q12H   Continuous Infusions: . sodium chloride 1 mL/kg/hr (06/02/16 0700)  . heparin 1,250 Units/hr (06/02/16 0000)   PRN Meds: sodium chloride, acetaminophen, albuterol, morphine injection, nitroGLYCERIN, ondansetron (ZOFRAN) IV, sodium chloride flush, traMADol   Vital Signs    Vitals:   06/02/16 0805 06/02/16 0922 06/02/16 1127 06/02/16 1216  BP: 111/77  115/77 133/77  Pulse: (!) 49  (!) 50 (!) 55  Resp: 15  17   Temp: 97.8 F (36.6 C)   97.4 F (36.3 C)  TempSrc: Oral   Oral  SpO2: 98% 100% 98% 99%  Weight:      Height:        Intake/Output Summary (Last 24 hours) at 06/02/16 1258 Last data filed at 06/02/16 1130  Gross per 24 hour  Intake           377.29 ml  Output              500 ml  Net          -122.71 ml   Filed Weights   05/31/16 2255 06/01/16 1600  Weight: 260 lb (117.9 kg) 257 lb 0.9 oz (116.6 kg)    Telemetry     - Personally Reviewed  ECG    NSR  - Personally Reviewed  Physical Exam   GEN: No acute distress.   Neck: No JVD Cardiac: RRR, no murmurs, rubs, or gallops.  Respiratory: Clear to auscultation bilaterally. GI: Soft, nontender, non-distended  MS: No edema; No deformity. Neuro:  Nonfocal  Psych: Normal  affect   Labs    Chemistry Recent Labs Lab 05/31/16 2256 06/01/16 0531 06/02/16 0233  NA 138 140 139  K 3.7 3.6 3.8  CL 99* 100* 103  CO2 24 26 27   GLUCOSE 200* 165* 131*  BUN 19 22* 23*  CREATININE 1.66* 1.81* 1.51*  CALCIUM 11.8* 12.2* 9.4  GFRNONAA 46* 41* 51*  GFRAA 53* 48* 59*  ANIONGAP 15 14 9      Hematology Recent Labs Lab 05/31/16 2256 06/02/16 0233  WBC 12.7* 5.9  RBC 5.39 4.87  HGB 15.6 13.6  HCT 45.5 42.5  MCV 84.4 87.3  MCH 28.9 27.9  MCHC 34.3 32.0  RDW 13.4 13.7  PLT 200 157    Cardiac Enzymes Recent Labs Lab 06/01/16 0531 06/01/16 1055 06/01/16 1602 06/01/16 2237  TROPONINI 0.03* 0.03* <0.03 <0.03    Recent Labs Lab 05/31/16 2313  TROPIPOC 0.04     BNPNo results for input(s): BNP, PROBNP in the last 168 hours.   DDimer No results for input(s): DDIMER in the last 168 hours.  Radiology    Dg Chest 2 View  Result Date: 05/31/2016 CLINICAL DATA:  Left-sided chest pain radiating down left arm. Shortness of breath. Epigastric pain and chest tightness. EXAM: CHEST  2 VIEW COMPARISON:  Radiograph 07/06/2012.  CT 05/22/2013 FINDINGS: Patient is post median sternotomy. The cardiomediastinal contours are unchanged, heart at the upper limits of normal in size. The lungs are clear. Pulmonary vasculature is normal. No consolidation, pleural effusion, or pneumothorax. No acute osseous abnormalities are seen. IMPRESSION: No active cardiopulmonary disease. Electronically Signed   By: Jeb Levering M.D.   On: 05/31/2016 23:38    Cardiac Studies     Patient Profile     56 y.o. male  - hx of CAD .  Here with recurrent CP .    Assessment & Plan    1.  CAD - for cath today . He is aware of risks, benefits, options. Agrees to proceed.   2. HTN _  Continue current meds.     Signed, Mertie Moores, MD  06/02/2016, 12:58 PM

## 2016-06-03 DIAGNOSIS — I2583 Coronary atherosclerosis due to lipid rich plaque: Secondary | ICD-10-CM

## 2016-06-03 DIAGNOSIS — E782 Mixed hyperlipidemia: Secondary | ICD-10-CM

## 2016-06-03 DIAGNOSIS — I10 Essential (primary) hypertension: Secondary | ICD-10-CM | POA: Diagnosis not present

## 2016-06-03 DIAGNOSIS — I2510 Atherosclerotic heart disease of native coronary artery without angina pectoris: Secondary | ICD-10-CM

## 2016-06-03 DIAGNOSIS — I739 Peripheral vascular disease, unspecified: Secondary | ICD-10-CM | POA: Diagnosis not present

## 2016-06-03 DIAGNOSIS — I200 Unstable angina: Secondary | ICD-10-CM | POA: Diagnosis not present

## 2016-06-03 DIAGNOSIS — J449 Chronic obstructive pulmonary disease, unspecified: Secondary | ICD-10-CM | POA: Diagnosis not present

## 2016-06-03 LAB — BASIC METABOLIC PANEL
Anion gap: 9 (ref 5–15)
BUN: 14 mg/dL (ref 6–20)
CO2: 29 mmol/L (ref 22–32)
Calcium: 8.8 mg/dL — ABNORMAL LOW (ref 8.9–10.3)
Chloride: 104 mmol/L (ref 101–111)
Creatinine, Ser: 1.27 mg/dL — ABNORMAL HIGH (ref 0.61–1.24)
GFR calc Af Amer: >60 mL/min (ref >60)
GFR calc non Af Amer: >60 mL/min (ref >60)
Glucose, Bld: 125 mg/dL — ABNORMAL HIGH (ref 65–99)
Potassium: 4.1 mmol/L (ref 3.5–5.1)
Sodium: 142 mmol/L (ref 135–145)

## 2016-06-03 LAB — CBC
HCT: 39.6 % (ref 39.0–52.0)
Hemoglobin: 12.7 g/dL — ABNORMAL LOW (ref 13.0–17.0)
MCH: 28 pg (ref 26.0–34.0)
MCHC: 32.1 g/dL (ref 30.0–36.0)
MCV: 87.2 fL (ref 78.0–100.0)
Platelets: 131 10*3/uL — ABNORMAL LOW (ref 150–400)
RBC: 4.54 MIL/uL (ref 4.22–5.81)
RDW: 13.3 % (ref 11.5–15.5)
WBC: 5.7 10*3/uL (ref 4.0–10.5)

## 2016-06-03 MED ORDER — CYCLOBENZAPRINE HCL 10 MG PO TABS: 5.0000 mg | ORAL_TABLET | Freq: Three times a day (TID) | ORAL | Status: DC | PRN

## 2016-06-03 MED ORDER — CYCLOBENZAPRINE HCL 5 MG PO TABS: 5.0000 mg | ORAL_TABLET | Freq: Three times a day (TID) | ORAL | 0 refills | Status: DC | PRN

## 2016-06-03 MED FILL — Heparin Sodium (Porcine) 2 Unit/ML in Sodium Chloride 0.9%: INTRAMUSCULAR | Qty: 500 | Status: AC

## 2016-06-03 NOTE — Care Management Obs Status (Signed)
[facility] NOTIFICATION   Patient Details  Name: [name] MRN: [id number] Date of Birth: 29-Nov-1960   Medicare Observation Status Notification Given:  Yes    Bethena Roys, RN 06/03/2016, 1:04 PM

## 2016-06-03 NOTE — Progress Notes (Signed)
Progress Note  Patient Name: [name] Date of Encounter: 06/03/2016  Primary Cardiologist: Nahser  Subjective   56 yo with hx of CAD, CABG, HTN, HLD. Admitted with angina lke CP - similar to previous Cath showed no new coronary disease.  He is feeling better.  troponins are negative     Inpatient Medications    Scheduled Meds: . aspirin EC  81 mg Oral Daily  . atorvastatin  80 mg Oral QHS  . clopidogrel  75 mg Oral Daily  . ezetimibe  10 mg Oral Daily  . gabapentin  800 mg Oral BID  . hydrochlorothiazide  25 mg Oral q morning - 10a  . losartan  100 mg Oral Daily  . mometasone-formoterol  2 puff Inhalation BID  . nitroGLYCERIN  1 inch Topical Q6H  . sodium chloride flush  3 mL Intravenous Q12H   Continuous Infusions:  PRN Meds: sodium chloride, acetaminophen, acetaminophen, albuterol, cyclobenzaprine, morphine injection, morphine injection, nitroGLYCERIN, ondansetron (ZOFRAN) IV, ondansetron (ZOFRAN) IV, sodium chloride flush, traMADol   Vital Signs    Vitals:   06/03/16 0348 06/03/16 0448 06/03/16 0516 06/03/16 0921  BP: (!) 102/58 (!) 104/54    Pulse: (!) 54 (!) 55    Resp: 10 20    Temp:   97.8 F (36.6 C)   TempSrc:   Oral   SpO2: 91% 97%  97%  Weight:   257 lb 4.8 oz (116.7 kg)   Height:        Intake/Output Summary (Last 24 hours) at 06/03/16 1000 Last data filed at 06/03/16 0021  Gross per 24 hour  Intake              615 ml  Output              700 ml  Net              -85 ml   Filed Weights   05/31/16 2255 06/01/16 1600 06/03/16 0516  Weight: 260 lb (117.9 kg) 257 lb 0.9 oz (116.6 kg) 257 lb 4.8 oz (116.7 kg)    Telemetry     - Personally Reviewed  ECG    NSR  - Personally Reviewed  Physical Exam   GEN: No acute distress.   Neck: No JVD Cardiac: RRR, no murmurs, rubs, or gallops.  Respiratory: Clear to auscultation bilaterally. GI: Soft, nontender, non-distended  MS: No edema; No deformity. Neuro:  Nonfocal  Psych:  Normal affect   Labs    Chemistry  Recent Labs Lab 06/01/16 0531 06/02/16 0233 06/03/16 0506  NA 140 139 142  K 3.6 3.8 4.1  CL 100* 103 104  CO2 26 27 29   GLUCOSE 165* 131* 125*  BUN 22* 23* 14  CREATININE 1.81* 1.51* 1.27*  CALCIUM 12.2* 9.4 8.8*  GFRNONAA 41* 51* >60  GFRAA 48* 59* >60  ANIONGAP 14 9 9      Hematology  Recent Labs Lab 05/31/16 2256 06/02/16 0233 06/03/16 0506  WBC 12.7* 5.9 5.7  RBC 5.39 4.87 4.54  HGB 15.6 13.6 12.7*  HCT 45.5 42.5 39.6  MCV 84.4 87.3 87.2  MCH 28.9 27.9 28.0  MCHC 34.3 32.0 32.1  RDW 13.4 13.7 13.3  PLT 200 157 131*    Cardiac Enzymes  Recent Labs Lab 06/01/16 0531 06/01/16 1055 06/01/16 1602 06/01/16 2237  TROPONINI 0.03* 0.03* <0.03 <0.03     Recent Labs Lab 05/31/16 2313  TROPIPOC 0.04     BNPNo results for input(s):  BNP, PROBNP in the last 168 hours.   DDimer No results for input(s): DDIMER in the last 168 hours.   Radiology    No results found.  Cardiac Studies     Patient Profile     56 y.o. male  - hx of CAD .  Here with recurrent CP .    Assessment & Plan    1.  CAD - cath looks stable Continue current medical therapy    Will ambulate in halls   2. HTN _  Continue current meds.   3, hyperlipidemia:  Continue atorvastatin    Signed, Mertie Moores, MD  06/03/2016, 10:00 AM

## 2016-06-03 NOTE — Discharge Summary (Signed)
Physician Discharge Summary  [name] [id number] DOB: [birth year] DOA: 06/01/2016  PCP: Odette Fraction, MD  Admit date: 06/01/2016 Discharge date: 06/03/2016  Admitted From: Home Disposition:  Home  Recommendations for Outpatient Follow-up:  1. Follow up with PCP in 2-3 weeks  Discharge Condition:Stable CODE STATUS:Full Diet recommendation: Heart healthy   Brief/Interim Summary: 56 y.o.malewith a past medical history significant for CAD s/p 3V CABG in 2010 and PCI x3 2011, PVD s/p iliac stent, and HTNwho presents with chest pain.  The patient was in his usual state of health until about 2 days ago developed left-sided chest pain radiating down his left arm, mild in intensity, "like heartburn", and similar to his previous angina related to his myocardial infarctions in 2010 in 2011. He first noticed this at rest, but it has persisted since then, getting steadily worse, worsening significantly with exertion, having SOB with exertion, and relieving with rest. He has eaten a whole bottle of TUMS with no relief and tonight he came here  1. Accelerating angina: Angina is typical of his previous ischemic chest pain and and appears to be accelerating.  -Serial troponins neg  -Cardiology was consulted. Pt has since undergone heart cath 2/5, no new coronary disease  -Continued on statin, Zetia, Plavix, baby aspirin  2. Hypertension: - remains stable at present -Continued losartan, HCTZ  3. COPD: -Continue Spiriva and Advair - Remained stable at present  4. Other medications: -Continued gabapentin per home regimen  Discharge Diagnoses:  Principal Problem:   Unstable angina (HCC) Active Problems:   Coronary artery disease due to lipid rich plaque   Essential hypertension   COPD (chronic obstructive pulmonary disease) ([place])   Peripheral vascular disease (HCC)   Angina at rest [facility])    Discharge Instructions   Allergies as of 06/03/2016      Reactions   Crestor [rosuvastatin]    Muscle aches.      Medication List    TAKE these medications   ADVAIR DISKUS 250-50 MCG/DOSE Aepb Generic drug:  Fluticasone-Salmeterol Take 2 puffs by mouth daily as needed (for trouble breathing).   albuterol 108 (90 Base) MCG/ACT inhaler Commonly known as:  PROVENTIL HFA;VENTOLIN HFA Inhale 2 puffs into the lungs every 4 (four) hours as needed for wheezing or shortness of breath.   aspirin EC 81 MG tablet Take 81 mg by mouth every morning.   atorvastatin 80 MG tablet Commonly known as:  LIPITOR TAKE 1 TABLET BY MOUTH EVERY NIGHT AT BEDTIME   clopidogrel 75 MG tablet Commonly known as:  PLAVIX Take 1 tablet (75 mg total) by mouth daily.   cyclobenzaprine 5 MG tablet Commonly known as:  FLEXERIL Take 1 tablet (5 mg total) by mouth 3 (three) times daily as needed (chest pain).   ezetimibe 10 MG tablet Commonly known as:  ZETIA Take 1 tablet (10 mg total) by mouth daily.   gabapentin 400 MG capsule Commonly known as:  NEURONTIN Take 2 capsules (800 mg total) by mouth 2 (two) times daily. What changed:  when to take this  reasons to take this   guaiFENesin-codeine 100-10 MG/5ML syrup Take 5 mLs by mouth every 6 (six) hours as needed. What changed:  reasons to take this   hydrochlorothiazide 25 MG tablet Commonly known as:  HYDRODIURIL TAKE 1 TABLET BY MOUTH EVERY MORNING   losartan 100 MG tablet Commonly known as:  COZAAR TAKE 1 TABLET BY MOUTH DAILY   potassium chloride 10 MEQ tablet Commonly known as:  K-DUR,KLOR-CON Take  1 tablet (10 mEq total) by mouth daily.   tiotropium 18 MCG inhalation capsule Commonly known as:  SPIRIVA Place 1 capsule (18 mcg total) into inhaler and inhale daily.      Follow-up Information    PICKARD,WARREN TOM, MD. Schedule an appointment as soon as possible for a visit in 2 week(s).   Specialty:  Family Medicine Contact information: [id number] [place] [address] [phone]           Allergies  Allergen Reactions  . Crestor [Rosuvastatin]     Muscle aches.    Consultations:  Cardiology  Procedures/Studies: Dg Chest 2 View  Result Date: 05/31/2016 CLINICAL DATA:  Left-sided chest pain radiating down left arm. Shortness of breath. Epigastric pain and chest tightness. EXAM: CHEST  2 VIEW COMPARISON:  Radiograph 07/06/2012.  CT 05/22/2013 FINDINGS: Patient is post median sternotomy. The cardiomediastinal contours are unchanged, heart at the upper limits of normal in size. The lungs are clear. Pulmonary vasculature is normal. No consolidation, pleural effusion, or pneumothorax. No acute osseous abnormalities are seen. IMPRESSION: No active cardiopulmonary disease. Electronically Signed   By: Jeb Levering M.D.   On: 05/31/2016 23:38     Subjective: Eager to go home  Discharge Exam: Vitals:   06/03/16 0516 06/03/16 0800  BP:    Pulse:  (!) 55  Resp:  17  Temp: 97.8 F (36.6 C)    Vitals:   06/03/16 0448 06/03/16 0516 06/03/16 0800 06/03/16 0921  BP: (!) 104/54     Pulse: (!) 55  (!) 55   Resp: 20  17   Temp:  97.8 F (36.6 C)    TempSrc:  Oral    SpO2: 97%  97% 97%  Weight:  116.7 kg (257 lb 4.8 oz)    Height:        General: Pt is alert, awake, not in acute distress Cardiovascular: RRR, S1/S2 +, no rubs, no gallops Respiratory: CTA bilaterally, no wheezing, no rhonchi Abdominal: Soft, NT, ND, bowel sounds + Extremities: no edema, no cyanosis   The results of significant diagnostics from this hospitalization (including imaging, microbiology, ancillary and laboratory) are listed below for reference.     Microbiology: Recent Results (from the past 240 hour(s))  MRSA PCR Screening     Status: None   Collection Time: 06/01/16  3:11 PM  Result Value Ref Range Status   MRSA by PCR NEGATIVE NEGATIVE Final    Comment:        The GeneXpert MRSA Assay (FDA approved for NASAL specimens only), is one component of a comprehensive MRSA  colonization surveillance program. It is not intended to diagnose MRSA infection nor to guide or monitor treatment for MRSA infections.      Labs: BNP (last 3 results) No results for input(s): BNP in the last 8760 hours. Basic Metabolic Panel:  Recent Labs Lab 05/31/16 2256 06/01/16 0531 06/02/16 0233 06/03/16 0506  NA 138 140 139 142  K 3.7 3.6 3.8 4.1  CL 99* 100* 103 104  CO2 24 26 27 29   GLUCOSE 200* 165* 131* 125*  BUN 19 22* 23* 14  CREATININE 1.66* 1.81* 1.51* 1.27*  CALCIUM 11.8* 12.2* 9.4 8.8*   Liver Function Tests: No results for input(s): AST, ALT, ALKPHOS, BILITOT, PROT, ALBUMIN in the last 168 hours. No results for input(s): LIPASE, AMYLASE in the last 168 hours. No results for input(s): AMMONIA in the last 168 hours. CBC:  Recent Labs Lab 05/31/16 2256 06/02/16  [id number] 06/03/16 0506  WBC 12.7* 5.9 5.7  HGB 15.6 13.6 12.7*  HCT 45.5 42.5 39.6  MCV 84.4 87.3 87.2  PLT 200 157 131*   Cardiac Enzymes:  Recent Labs Lab 06/01/16 0531 06/01/16 1055 06/01/16 1602 06/01/16 2237  TROPONINI 0.03* 0.03* <0.03 <0.03   BNP: Invalid input(s): POCBNP CBG: No results for input(s): GLUCAP in the last 168 hours. D-Dimer No results for input(s): DDIMER in the last 72 hours. Hgb A1c No results for input(s): HGBA1C in the last 72 hours. Lipid Profile  Recent Labs  06/01/16 0532  CHOL 207*  HDL 36*  LDLCALC 130*  TRIG 205*  CHOLHDL 5.8   Thyroid function studies No results for input(s): TSH, T4TOTAL, T3FREE, THYROIDAB in the last 72 hours.  Invalid input(s): FREET3 Anemia work up No results for input(s): VITAMINB12, FOLATE, FERRITIN, TIBC, IRON, RETICCTPCT in the last 72 hours. Urinalysis    Component Value Date/Time   COLORURINE YELLOW 11/27/2011 0023   APPEARANCEUR CLEAR 11/27/2011 0023   LABSPEC 1.020 11/27/2011 0023   PHURINE 6.0 11/27/2011 0023   GLUCOSEU NEGATIVE 11/27/2011 0023   HGBUR NEGATIVE 11/27/2011 0023   BILIRUBINUR  NEGATIVE 11/27/2011 0023   KETONESUR NEGATIVE 11/27/2011 0023   PROTEINUR NEGATIVE 11/27/2011 0023   UROBILINOGEN 0.2 11/27/2011 0023   NITRITE NEGATIVE 11/27/2011 0023   LEUKOCYTESUR NEGATIVE 11/27/2011 0023   Sepsis Labs Invalid input(s): PROCALCITONIN,  WBC,  LACTICIDVEN Microbiology Recent Results (from the past 240 hour(s))  MRSA PCR Screening     Status: None   Collection Time: 06/01/16  3:11 PM  Result Value Ref Range Status   MRSA by PCR NEGATIVE NEGATIVE Final    Comment:        The GeneXpert MRSA Assay (FDA approved for NASAL specimens only), is one component of a comprehensive MRSA colonization surveillance program. It is not intended to diagnose MRSA infection nor to guide or monitor treatment for MRSA infections.      SIGNED:   Donne Hazel, MD  [facility] 06/03/2016, 12:33 PM  If 7PM-7AM, please contact night-coverage [email or web address] Password [email or web address]

## 2016-06-03 NOTE — Progress Notes (Signed)
Pt discharge instructions reivewed with RX verb understanding. Pt left ambulatory with tech at side in NAD.

## 2016-06-03 NOTE — Discharge Instructions (Signed)
Nonspecific Chest Pain °Chest pain can be caused by many different conditions. There is always a chance that your pain could be related to something serious, such as a heart attack or a blood clot in your lungs. Chest pain can also be caused by conditions that are not life-threatening. If you have chest pain, it is very important to follow up with your health care provider. °What are the causes? °Causes of this condition include: °· Heartburn. °· Pneumonia or bronchitis. °· Anxiety or stress. °· Inflammation around your heart (pericarditis) or lung (pleuritis or pleurisy). °· A blood clot in your lung. °· A collapsed lung (pneumothorax). This can develop suddenly on its own (spontaneous pneumothorax) or from trauma to the chest. °· Shingles infection (varicella-zoster virus). °· Heart attack. °· Damage to the bones, muscles, and cartilage that make up your chest wall. This can include: °? Bruised bones due to injury. °? Strained muscles or cartilage due to frequent or repeated coughing or overwork. °? Fracture to one or more ribs. °? Sore cartilage due to inflammation (costochondritis). ° °What increases the risk? °Risk factors for this condition may include: °· Activities that increase your risk for trauma or injury to your chest. °· Respiratory infections or conditions that cause frequent coughing. °· Medical conditions or overeating that can cause heartburn. °· Heart disease or family history of heart disease. °· Conditions or health behaviors that increase your risk of developing a blood clot. °· Having had chicken pox (varicella zoster). ° °What are the signs or symptoms? °Chest pain can feel like: °· Burning or tingling on the surface of your chest or deep in your chest. °· Crushing, pressure, aching, or squeezing pain. °· Dull or sharp pain that is worse when you move, cough, or take a deep breath. °· Pain that is also felt in your back, neck, shoulder, or arm, or pain that spreads to any of these  areas. ° °Your chest pain may come and go, or it may stay constant. °How is this diagnosed? °Lab tests or other studies may be needed to find the cause of your pain. Your health care provider may have you take a test called an ECG (electrocardiogram). An ECG records your heartbeat patterns at the time the test is performed. You may also have other tests, such as: °· Transthoracic echocardiogram (TTE). In this test, sound waves are used to create a picture of the heart structures and to look at how blood flows through your heart. °· Transesophageal echocardiogram (TEE). This is a more advanced imaging test that takes images from inside your body. It allows your health care provider to see your heart in finer detail. °· Cardiac monitoring. This allows your health care provider to monitor your heart rate and rhythm in real time. °· Holter monitor. This is a portable device that records your heartbeat and can help to diagnose abnormal heartbeats. It allows your health care provider to track your heart activity for several days, if needed. °· Stress tests. These can be done through exercise or by taking medicine that makes your heart beat more quickly. °· Blood tests. °· Other imaging tests. ° °How is this treated? °Treatment depends on what is causing your chest pain. Treatment may include: °· Medicines. These may include: °? Acid blockers for heartburn. °? Anti-inflammatory medicine. °? Pain medicine for inflammatory conditions. °? Antibiotic medicine, if an infection is present. °? Medicines to dissolve blood clots. °? Medicines to treat coronary artery disease (CAD). °· Supportive care for conditions that   do not require medicines. This may include: °? Resting. °? Applying heat or cold packs to injured areas. °? Limiting activities until pain decreases. ° °Follow these instructions at home: °Medicines °· If you were prescribed an antibiotic, take it as told by your health care provider. Do not stop taking the  antibiotic even if you start to feel better. °· Take over-the-counter and prescription medicines only as told by your health care provider. °Lifestyle °· Do not use any products that contain nicotine or tobacco, such as cigarettes and e-cigarettes. If you need help quitting, ask your health care provider. °· Do not drink alcohol. °· Make lifestyle changes as directed by your health care provider. These may include: °? Getting regular exercise. Ask your health care provider to suggest some activities that are safe for you. °? Eating a heart-healthy diet. A registered dietitian can help you to learn healthy eating options. °? Maintaining a healthy weight. °? Managing diabetes, if necessary. °? Reducing stress, such as with yoga or relaxation techniques. °General instructions °· Avoid any activities that bring on chest pain. °· If heartburn is the cause for your chest pain, raise (elevate) the head of your bed about 6 inches (15 cm) by putting blocks under the legs. Sleeping with more pillows does not effectively relieve heartburn because it only changes the position of your head. °· Keep all follow-up visits as told by your health care provider. This is important. This includes any further testing if your chest pain does not go away. °Contact a health care provider if: °· Your chest pain does not go away. °· You have a rash with blisters on your chest. °· You have a fever. °· You have chills. °Get help right away if: °· Your chest pain is worse. °· You have a cough that gets worse, or you cough up blood. °· You have severe pain in your abdomen. °· You have severe weakness. °· You faint. °· You have sudden, unexplained chest discomfort. °· You have sudden, unexplained discomfort in your arms, back, neck, or jaw. °· You have shortness of breath at any time. °· You suddenly start to sweat, or your skin gets clammy. °· You feel nauseous or you vomit. °· You suddenly feel light-headed or dizzy. °· Your heart begins to beat  quickly, or it feels like it is skipping beats. °These symptoms may represent a serious problem that is an emergency. Do not wait to see if the symptoms will go away. Get medical help right away. Call your local emergency services (911 in the [place] Do not drive yourself to the hospital. °This information is not intended to replace advice given to you by your health care provider. Make sure you discuss any questions you have with your health care provider. °Document Released: 01/22/2005 Document Revised: 01/07/2016 Document Reviewed: 01/07/2016 °Elsevier Interactive Patient Education © 2017 [employer] ° °

## 2016-06-16 DIAGNOSIS — Z86010 Personal history of colonic polyps: Secondary | ICD-10-CM | POA: Diagnosis not present

## 2016-06-16 DIAGNOSIS — K219 Gastro-esophageal reflux disease without esophagitis: Secondary | ICD-10-CM | POA: Diagnosis not present

## 2016-06-16 DIAGNOSIS — R079 Chest pain, unspecified: Secondary | ICD-10-CM | POA: Diagnosis not present

## 2016-06-19 ENCOUNTER — Telehealth: Payer: Self-pay | Admitting: Nurse Practitioner

## 2016-06-19 NOTE — Telephone Encounter (Signed)
Clearance for colonoscopy on 06/24/16 with Dr. Benson Norway forwarded to Dr. Acie Fredrickson. Request is to hold Plavix for 5 days prior to procedure  Fax clearance to [employer] at [phone] Phone: [phone]

## 2016-06-19 NOTE — Telephone Encounter (Signed)
Called and spoke with  [name] at Dr. Ulyses Amor office to advise her that clearance has been signed and patient may hold Plavix 5 days prior to colonoscopy. I advised her to call back if she does not receive fax before the end of the day. She thanked me for the call.

## 2016-06-19 NOTE — Telephone Encounter (Signed)
I have cleared him to hold the plavix for 5 days prior to GI procedure on paper. Will be faxed to the GI office.

## 2016-06-19 NOTE — Telephone Encounter (Signed)
Follow Up:     [name] ( Dr.Hung's office ) is calling about the clearance for [name]

## 2016-06-24 DIAGNOSIS — K635 Polyp of colon: Secondary | ICD-10-CM | POA: Diagnosis not present

## 2016-06-24 DIAGNOSIS — D123 Benign neoplasm of transverse colon: Secondary | ICD-10-CM | POA: Diagnosis not present

## 2016-06-24 DIAGNOSIS — Z1211 Encounter for screening for malignant neoplasm of colon: Secondary | ICD-10-CM | POA: Diagnosis not present

## 2016-06-24 DIAGNOSIS — Z86010 Personal history of colonic polyps: Secondary | ICD-10-CM | POA: Diagnosis not present

## 2016-06-24 DIAGNOSIS — K5730 Diverticulosis of large intestine without perforation or abscess without bleeding: Secondary | ICD-10-CM | POA: Diagnosis not present

## 2016-08-07 ENCOUNTER — Other Ambulatory Visit: Payer: Self-pay | Admitting: Family Medicine

## 2016-08-28 DIAGNOSIS — Z5181 Encounter for therapeutic drug level monitoring: Secondary | ICD-10-CM | POA: Diagnosis not present

## 2016-08-28 DIAGNOSIS — M79672 Pain in left foot: Secondary | ICD-10-CM | POA: Diagnosis not present

## 2016-08-28 DIAGNOSIS — F1190 Opioid use, unspecified, uncomplicated: Secondary | ICD-10-CM | POA: Diagnosis not present

## 2016-08-28 DIAGNOSIS — G8921 Chronic pain due to trauma: Secondary | ICD-10-CM | POA: Diagnosis not present

## 2016-08-28 DIAGNOSIS — Z79899 Other long term (current) drug therapy: Secondary | ICD-10-CM | POA: Diagnosis not present

## 2016-08-28 DIAGNOSIS — M25572 Pain in left ankle and joints of left foot: Secondary | ICD-10-CM | POA: Diagnosis not present

## 2016-10-06 ENCOUNTER — Other Ambulatory Visit: Payer: Self-pay | Admitting: Family Medicine

## 2016-11-06 ENCOUNTER — Other Ambulatory Visit: Payer: PPO

## 2016-11-06 DIAGNOSIS — Z79899 Other long term (current) drug therapy: Secondary | ICD-10-CM

## 2016-11-06 DIAGNOSIS — I10 Essential (primary) hypertension: Secondary | ICD-10-CM

## 2016-11-06 DIAGNOSIS — E785 Hyperlipidemia, unspecified: Secondary | ICD-10-CM

## 2016-11-06 LAB — CBC WITH DIFFERENTIAL/PLATELET
Basophils Absolute: 63 cells/uL (ref 0–200)
Basophils Relative: 1 %
Eosinophils Absolute: 63 cells/uL (ref 15–500)
Eosinophils Relative: 1 %
HCT: 51.4 % — ABNORMAL HIGH (ref 38.5–50.0)
Hemoglobin: 16.8 g/dL (ref 13.0–17.0)
Lymphocytes Relative: 23 %
Lymphs Abs: 1449 cells/uL (ref 850–3900)
MCH: 27.9 pg (ref 27.0–33.0)
MCHC: 32.7 g/dL (ref 32.0–36.0)
MCV: 85.2 fL (ref 80.0–100.0)
MPV: 10.7 fL (ref 7.5–12.5)
Monocytes Absolute: 567 cells/uL (ref 200–950)
Monocytes Relative: 9 %
Neutro Abs: 4158 cells/uL (ref 1500–7800)
Neutrophils Relative %: 66 %
Platelets: 218 10*3/uL (ref 140–400)
RBC: 6.03 MIL/uL — ABNORMAL HIGH (ref 4.20–5.80)
RDW: 14.1 % (ref 11.0–15.0)
WBC: 6.3 10*3/uL (ref 3.8–10.8)

## 2016-11-06 LAB — COMPREHENSIVE METABOLIC PANEL
ALT: 22 U/L (ref 9–46)
AST: 24 U/L (ref 10–35)
Albumin: 4.5 g/dL (ref 3.6–5.1)
Alkaline Phosphatase: 98 U/L (ref 40–115)
BUN: 13 mg/dL (ref 7–25)
CO2: 26 mmol/L (ref 20–31)
Calcium: 9.6 mg/dL (ref 8.6–10.3)
Chloride: 101 mmol/L (ref 98–110)
Creat: 1.38 mg/dL — ABNORMAL HIGH (ref 0.70–1.33)
Glucose, Bld: 139 mg/dL — ABNORMAL HIGH (ref 70–99)
Potassium: 4.2 mmol/L (ref 3.5–5.3)
Sodium: 139 mmol/L (ref 135–146)
Total Bilirubin: 1.2 mg/dL (ref 0.2–1.2)
Total Protein: 7.3 g/dL (ref 6.1–8.1)

## 2016-11-06 LAB — LIPID PANEL
Cholesterol: 216 mg/dL — ABNORMAL HIGH (ref <200)
HDL: 41 mg/dL (ref >40)
LDL Cholesterol: 151 mg/dL — ABNORMAL HIGH (ref <100)
Total CHOL/HDL Ratio: 5.3 Ratio — ABNORMAL HIGH (ref <5.0)
Triglycerides: 120 mg/dL (ref <150)
VLDL: 24 mg/dL (ref <30)

## 2016-11-07 ENCOUNTER — Encounter: Payer: Self-pay | Admitting: Family Medicine

## 2016-11-07 ENCOUNTER — Other Ambulatory Visit: Payer: Self-pay | Admitting: Family Medicine

## 2016-11-07 ENCOUNTER — Ambulatory Visit (INDEPENDENT_AMBULATORY_CARE_PROVIDER_SITE_OTHER): Payer: PPO | Admitting: Family Medicine

## 2016-11-07 VITALS — BP 122/84 | HR 80 | Temp 98.1°F | Resp 16 | Ht 69.0 in | Wt 255.0 lb

## 2016-11-07 DIAGNOSIS — I2510 Atherosclerotic heart disease of native coronary artery without angina pectoris: Secondary | ICD-10-CM | POA: Diagnosis not present

## 2016-11-07 DIAGNOSIS — Z23 Encounter for immunization: Secondary | ICD-10-CM

## 2016-11-07 DIAGNOSIS — R739 Hyperglycemia, unspecified: Secondary | ICD-10-CM | POA: Diagnosis not present

## 2016-11-07 DIAGNOSIS — I739 Peripheral vascular disease, unspecified: Secondary | ICD-10-CM | POA: Diagnosis not present

## 2016-11-07 DIAGNOSIS — Z125 Encounter for screening for malignant neoplasm of prostate: Secondary | ICD-10-CM | POA: Diagnosis not present

## 2016-11-07 DIAGNOSIS — I10 Essential (primary) hypertension: Secondary | ICD-10-CM

## 2016-11-07 DIAGNOSIS — Z1159 Encounter for screening for other viral diseases: Secondary | ICD-10-CM

## 2016-11-07 DIAGNOSIS — Z0000 Encounter for general adult medical examination without abnormal findings: Secondary | ICD-10-CM | POA: Diagnosis not present

## 2016-11-07 DIAGNOSIS — I2583 Coronary atherosclerosis due to lipid rich plaque: Secondary | ICD-10-CM | POA: Diagnosis not present

## 2016-11-07 DIAGNOSIS — E7800 Pure hypercholesterolemia, unspecified: Secondary | ICD-10-CM

## 2016-11-07 MED ORDER — ASPIRIN EC 81 MG PO TBEC: 81.0000 mg | DELAYED_RELEASE_TABLET | ORAL | 3 refills | Status: DC

## 2016-11-07 NOTE — Progress Notes (Signed)
Subjective:    Patient ID: [name], male    DOB: [birth year], 56 y.o.   MRN: [id number]  HPI  Patient has not been seen in more than 2 years. Past medical history is significant for coronary artery disease status post myocardial infarction, peripheral vascular disease, COPD, hypertension. He reports that he is having dyspnea on exertion. He does not see any benefit from Advair nor Spiriva. He is not taking the medications together. He is on Lipitor 80 mg a day and he is compliant with medication however his recent fasting lab work shows an LDL cholesterol greater than 151. He is not taking his zetia.  His colonoscopy was recently performed one month ago and according to the patient was reportedly normal. He is due for a PSA to screen for prostate cancer. His most recent immunizations are listed below. He is due for Prevnar 13. His most recent lab work is also listed below: Immunization History  Administered Date(s) Administered  . Influenza Split 12/30/2010  . Influenza,inj,Quad PF,36+ Mos 01/13/2013, 05/23/2013, 01/12/2014, 04/27/2015, 01/08/2016, 06/02/2016  . Pneumococcal Conjugate-13 11/07/2016  . Pneumococcal Polysaccharide-23 05/23/2013  . Td 10/04/2010  . Tdap 10/04/2010    Appointment on 11/06/2016  Component Date Value Ref Range Status  . WBC 11/06/2016 6.3  3.8 - 10.8 K/uL Final  . RBC 11/06/2016 6.03* 4.20 - 5.80 MIL/uL Final  . Hemoglobin 11/06/2016 16.8  13.0 - 17.0 g/dL Final  . HCT 11/06/2016 51.4* 38.5 - 50.0 % Final  . MCV 11/06/2016 85.2  80.0 - 100.0 fL Final  . MCH 11/06/2016 27.9  27.0 - 33.0 pg Final  . MCHC 11/06/2016 32.7  32.0 - 36.0 g/dL Final  . RDW 11/06/2016 14.1  11.0 - 15.0 % Final  . Platelets 11/06/2016 218  140 - 400 K/uL Final  . MPV 11/06/2016 10.7  7.5 - 12.5 fL Final  . Neutro Abs 11/06/2016 4158  1,500 - 7,800 cells/uL Final  . Lymphs Abs 11/06/2016 1449  850 - 3,900 cells/uL Final  . Monocytes Absolute 11/06/2016 567  200 - 950 cells/uL  Final  . Eosinophils Absolute 11/06/2016 63  15 - 500 cells/uL Final  . Basophils Absolute 11/06/2016 63  0 - 200 cells/uL Final  . Neutrophils Relative % 11/06/2016 66  % Final  . Lymphocytes Relative 11/06/2016 23  % Final  . Monocytes Relative 11/06/2016 9  % Final  . Eosinophils Relative 11/06/2016 1  % Final  . Basophils Relative 11/06/2016 1  % Final  . Smear Review 11/06/2016 Criteria for review not met   Final  . Sodium 11/06/2016 139  135 - 146 mmol/L Final  . Potassium 11/06/2016 4.2  3.5 - 5.3 mmol/L Final  . Chloride 11/06/2016 101  98 - 110 mmol/L Final  . CO2 11/06/2016 26  20 - 31 mmol/L Final  . Glucose, Bld 11/06/2016 139* 70 - 99 mg/dL Final  . BUN 11/06/2016 13  7 - 25 mg/dL Final  . Creat 11/06/2016 1.38* 0.70 - 1.33 mg/dL Final   Comment:   For patients > or = 54 years of age: The upper reference limit for Creatinine is approximately 13% higher for people identified as African-American.     . Total Bilirubin 11/06/2016 1.2  0.2 - 1.2 mg/dL Final  . Alkaline Phosphatase 11/06/2016 98  40 - 115 U/L Final  . AST 11/06/2016 24  10 - 35 U/L Final  . ALT 11/06/2016 22  9 - 46 U/L Final  . Total  Protein 11/06/2016 7.3  6.1 - 8.1 g/dL Final  . Albumin 11/06/2016 4.5  3.6 - 5.1 g/dL Final  . Calcium 11/06/2016 9.6  8.6 - 10.3 mg/dL Final  . Cholesterol 11/06/2016 216* <200 mg/dL Final  . Triglycerides 11/06/2016 120  <150 mg/dL Final  . HDL 11/06/2016 41  >40 mg/dL Final  . Total CHOL/HDL Ratio 11/06/2016 5.3* <5.0 Ratio Final  . VLDL 11/06/2016 24  <30 mg/dL Final  . LDL Cholesterol 11/06/2016 151* <100 mg/dL Final   Past Medical History:  Diagnosis Date  . Acute MI, inferior wall ([place]) 04/2009  . Chest pain   . COPD (chronic obstructive pulmonary disease) ([place])   . Coronary artery disease    STATUS POST CABG 01/2009  . Dyslipidemia   . Hyperlipidemia   . Hypertension   . Orthostatic hypotension   . Syncope and collapse    Past Surgical History:    Procedure Laterality Date  . ANKLE SURGERY     x 2  . COLONOSCOPY  April 2013  . CORONARY ANGIOPLASTY    . CORONARY ARTERY BYPASS GRAFT  2010  . FOOT SURGERY    . HERNIA REPAIR  01/23/12   umb hernia  . Kidney stimulator    . LEFT HEART CATH AND CORS/GRAFTS ANGIOGRAPHY N/A 06/02/2016   Procedure: Left Heart Cath and Cors/Grafts Angiography;  Surgeon: Lorretta Harp, MD;  Location: [facility];  Service: Cardiovascular;  Laterality: N/A;  . LEFT HEART CATHETERIZATION WITH CORONARY/GRAFT ANGIOGRAM N/A 07/08/2012   Procedure: LEFT HEART CATHETERIZATION WITH Beatrix Fetters;  Surgeon: Thayer Headings, MD;  Location: [facility] CATH LAB;  Service: Cardiovascular;  Laterality: N/A;  . PR VEIN BYPASS GRAFT,AORTO-FEM-POP    . STERNAL WIRES REMOVAL  07/08/2011   Procedure: STERNAL WIRES REMOVAL;  Surgeon: Grace Isaac, MD;  Location: [facility];  Service: Open Heart Surgery;  Laterality: N/A;  removal of 4th wire   Current Outpatient Prescriptions on File Prior to Visit  Medication Sig Dispense Refill  . albuterol (PROVENTIL HFA;VENTOLIN HFA) 108 (90 BASE) MCG/ACT inhaler Inhale 2 puffs into the lungs every 4 (four) hours as needed for wheezing or shortness of breath. 1 Inhaler 1  . atorvastatin (LIPITOR) 80 MG tablet TAKE 1 TABLET BY MOUTH EVERY NIGHT AT BEDTIME 90 tablet 1  . clopidogrel (PLAVIX) 75 MG tablet Take 1 tablet (75 mg total) by mouth daily. 90 tablet 3  . cyclobenzaprine (FLEXERIL) 5 MG tablet Take 1 tablet (5 mg total) by mouth 3 (three) times daily as needed (chest pain). 30 tablet 0  . ezetimibe (ZETIA) 10 MG tablet Take 1 tablet (10 mg total) by mouth daily. 90 tablet 3  . gabapentin (NEURONTIN) 400 MG capsule Take 2 capsules (800 mg total) by mouth 2 (two) times daily. (Patient taking differently: Take 800 mg by mouth 2 (two) times daily as needed (pain). ) 120 capsule 6  . hydrochlorothiazide (HYDRODIURIL) 25 MG tablet TAKE 1 TABLET BY MOUTH EVERY MORNING 90 tablet 3  .  losartan (COZAAR) 100 MG tablet TAKE 1 TABLET BY MOUTH DAILY 30 tablet 11  . potassium chloride (K-DUR,KLOR-CON) 10 MEQ tablet Take 1 tablet (10 mEq total) by mouth daily. 30 tablet 5  . tiotropium (SPIRIVA) 18 MCG inhalation capsule Place 1 capsule (18 mcg total) into inhaler and inhale daily. (Patient not taking: Reported on 11/07/2016) 30 capsule 5   No current facility-administered medications on file prior to visit.    Allergies  Allergen Reactions  .  Crestor [Rosuvastatin]     Muscle aches.   Social History   Social History  . Marital status: Married    Spouse name: N/A  . Number of children: 2  . Years of education: N/A   Occupational History  . Disability    Social History Main Topics  . Smoking status: Former Smoker    Packs/day: 3.00    Years: 30.00    Types: Cigarettes, Cigars    Quit date: 01/26/2009  . Smokeless tobacco: Never Used  . Alcohol use No  . Drug use: No  . Sexual activity: Not Currently   Other Topics Concern  . Not on file   Social History Narrative  . No narrative on file   Family History  Problem Relation Age of Onset  . Coronary artery disease Father 44  . Diabetes Father   . Hyperlipidemia Father   . Heart disease Father        before age 68  . Hypertension Father   . Cancer Mother        uterine  . Hyperlipidemia Mother   . Diabetes Mother   . Hypertension Mother   . Hypertension Brother      Review of Systems  All other systems reviewed and are negative.      Objective:   Physical Exam  Constitutional: He is oriented to person, place, and time. He appears well-developed and well-nourished. No distress.  HENT:  Head: Normocephalic and atraumatic.  Right Ear: External ear normal.  Left Ear: External ear normal.  Nose: Nose normal.  Mouth/Throat: Oropharynx is clear and moist. No oropharyngeal exudate.  Eyes: Pupils are equal, round, and reactive to light. Conjunctivae and EOM are normal. Right eye exhibits no discharge.  Left eye exhibits no discharge. No scleral icterus.  Neck: Normal range of motion. Neck supple. No JVD present. No tracheal deviation present. No thyromegaly present.  Cardiovascular: Normal rate, regular rhythm, normal heart sounds and intact distal pulses.  Exam reveals no gallop and no friction rub.   No murmur heard. Pulmonary/Chest: Effort normal and breath sounds normal. No stridor. No respiratory distress. He has no wheezes. He has no rales. He exhibits no tenderness.  Abdominal: Soft. Bowel sounds are normal. He exhibits no distension and no mass. There is no tenderness. There is no rebound and no guarding.  Musculoskeletal: Normal range of motion. He exhibits no edema, tenderness or deformity.  Lymphadenopathy:    He has no cervical adenopathy.  Neurological: He is alert and oriented to person, place, and time. He has normal reflexes. He displays normal reflexes. No cranial nerve deficit. He exhibits normal muscle tone. Coordination normal.  Skin: Skin is warm. No rash noted. He is not diaphoretic. No erythema. No pallor.  Psychiatric: He has a normal mood and affect. His behavior is normal. Judgment and thought content normal.  Vitals reviewed.         Assessment & Plan:  Hyperglycemia - Plan: Hemoglobin A1c  Prostate cancer screening - Plan: PSA  Encounter for hepatitis C screening test for low risk patient - Plan: Hepatitis C Ab Reflex HCV RNA, QUANT, HIV antibody (with reflex), Pneumococcal conjugate vaccine 13-valent IM  General medical exam  Coronary artery disease due to lipid rich plaque  Essential hypertension  Peripheral vascular disease ([place])  Need for prophylactic vaccination against Streptococcus pneumoniae (pneumococcus) - Plan: Pneumococcal conjugate vaccine 13-valent IM  I'm concerned by the patient's hyperglycemia. I will add a hemoglobin A1c. His fasting blood sugar is  concerning for type 2 diabetes mellitus. His LDL cholesterol was well above goal. I  will refer the patient back to his cardiologist who may be able to get him approved for praluent due to failure of statin.  Regarding his preventative care, he received prevnar 13.  I will check psa.  I will screen for hep c and hiv.  Colonoscopy is UTD.

## 2016-11-08 LAB — HEMOGLOBIN A1C
Hgb A1c MFr Bld: 5.7 % — ABNORMAL HIGH (ref <5.7)
Mean Plasma Glucose: 117 mg/dL

## 2016-11-08 LAB — HIV ANTIBODY (ROUTINE TESTING W REFLEX): HIV 1&2 Ab, 4th Generation: NONREACTIVE

## 2016-11-08 LAB — HEPATITIS C ANTIBODY: HCV Ab: NEGATIVE

## 2016-11-08 LAB — PSA: PSA: 0.4 ng/mL (ref <=4.0)

## 2016-11-18 ENCOUNTER — Other Ambulatory Visit: Payer: Self-pay | Admitting: Family Medicine

## 2016-11-20 DIAGNOSIS — G8921 Chronic pain due to trauma: Secondary | ICD-10-CM | POA: Diagnosis not present

## 2016-11-20 DIAGNOSIS — M79672 Pain in left foot: Secondary | ICD-10-CM | POA: Diagnosis not present

## 2016-11-20 DIAGNOSIS — M25572 Pain in left ankle and joints of left foot: Secondary | ICD-10-CM | POA: Diagnosis not present

## 2016-11-20 DIAGNOSIS — G894 Chronic pain syndrome: Secondary | ICD-10-CM | POA: Diagnosis not present

## 2017-01-06 ENCOUNTER — Ambulatory Visit (INDEPENDENT_AMBULATORY_CARE_PROVIDER_SITE_OTHER): Payer: PPO | Admitting: *Deleted

## 2017-01-06 ENCOUNTER — Other Ambulatory Visit: Payer: Self-pay | Admitting: Family Medicine

## 2017-01-06 DIAGNOSIS — Z23 Encounter for immunization: Secondary | ICD-10-CM

## 2017-01-06 MED ORDER — ALBUTEROL SULFATE HFA 108 (90 BASE) MCG/ACT IN AERS: 2.0000 | INHALATION_SPRAY | RESPIRATORY_TRACT | 1 refills | Status: DC | PRN

## 2017-01-06 NOTE — Progress Notes (Signed)
Patient seen in office for Influenza Vaccination.   Tolerated IM administration well.   Immunization history updated.  

## 2017-02-17 DIAGNOSIS — M25572 Pain in left ankle and joints of left foot: Secondary | ICD-10-CM | POA: Diagnosis not present

## 2017-02-17 DIAGNOSIS — M79672 Pain in left foot: Secondary | ICD-10-CM | POA: Diagnosis not present

## 2017-02-17 DIAGNOSIS — G8921 Chronic pain due to trauma: Secondary | ICD-10-CM | POA: Diagnosis not present

## 2017-02-17 DIAGNOSIS — G894 Chronic pain syndrome: Secondary | ICD-10-CM | POA: Diagnosis not present

## 2017-02-22 NOTE — Progress Notes (Signed)
Patient ID: [name]                 DOB: June 29, 1960                    MRN: [id number]     HPI: [name] is a 56 y.o. male patient of Dr Acie Fredrickson referred to lipid clinic by PCP Dr. Jacobo Forest. PMH is significant for CAD s/p MI and CABG (2010) and PCI (2011), PVD s/p iliac stenting, HTN, and COPD. Pt has reportedly been taking high-dose atorvastatin and continues to have an elevated LDL of 151 mg/dl.   Pt presents to clinic to discuss options for lowering cholesterol. Pt endorses compliance with atorvastatin 80mg  daily for several years now without side effects. Per records, pt has previously used Zetia but stopped, but pt claims he never started this medication. The only other cholesterol-lowering medication he has ever tried was rosuvastatin but he developed myalgias so he was transitioned to atorvastatin several years ago. Pt endorses eating poorly and has limited ability to exercise due to ankle injury.  Current Medications: atorvastatin 80mg  daily  Intolerances: rosuvastatin (muscle aches)  Risk Factors: Hx CAD s/p MI, CABG, and PCI, PVD, HTN, family history CAD  LDL goal: < 70mg /dl  Diet: Breakfast - sausage biscuit, iced tea. Lunch - usually skips. Dinner - noodles, minimal fruits/vegetables, minimal protein. No snacking throughout the day. Drinks two 12 oz cans/day of diet soda and four 20 oz bottles/day of water.  Exercise: Minimal exercise due to L ankle injury  Family History: CAD (father), DM (father), HTN (father, mother, brother), HLD (father)  Social History: Former smoker (quit 2010), denies alcohol use  Labs: Lipid Panel 10/2016: TC 216, TG 120, HDL 41, LDL 151 (Lipitor 80mg  daily)  Past Medical History:  Diagnosis Date  . Acute MI, inferior wall ([place]) 04/2009  . Chest pain   . COPD (chronic obstructive pulmonary disease) ([place])   . Coronary artery disease    STATUS POST CABG 01/2009  . Dyslipidemia   . Hyperlipidemia   . Hypertension   . Orthostatic  hypotension   . Syncope and collapse     Current Outpatient Prescriptions on File Prior to Visit  Medication Sig Dispense Refill  . albuterol (PROVENTIL HFA;VENTOLIN HFA) 108 (90 Base) MCG/ACT inhaler Inhale 2 puffs into the lungs every 4 (four) hours as needed for wheezing or shortness of breath. 1 Inhaler 1  . aspirin EC 81 MG tablet Take 1 tablet (81 mg total) by mouth every morning. 90 tablet 3  . atorvastatin (LIPITOR) 80 MG tablet TAKE 1 TABLET BY MOUTH EVERY NIGHT AT BEDTIME 90 tablet 1  . clopidogrel (PLAVIX) 75 MG tablet Take 1 tablet (75 mg total) by mouth daily. 90 tablet 3  . cyclobenzaprine (FLEXERIL) 5 MG tablet Take 1 tablet (5 mg total) by mouth 3 (three) times daily as needed (chest pain). 30 tablet 0  . ezetimibe (ZETIA) 10 MG tablet Take 1 tablet (10 mg total) by mouth daily. 90 tablet 3  . gabapentin (NEURONTIN) 400 MG capsule Take 2 capsules (800 mg total) by mouth 2 (two) times daily. (Patient taking differently: Take 800 mg by mouth 2 (two) times daily as needed (pain). ) 120 capsule 6  . hydrochlorothiazide (HYDRODIURIL) 25 MG tablet TAKE 1 TABLET BY MOUTH EVERY MORNING 90 tablet 3  . hydrochlorothiazide (HYDRODIURIL) 25 MG tablet TAKE 1 TABLET BY MOUTH EVERY MORNING 90 tablet 3  . losartan (COZAAR) 100 MG  tablet TAKE 1 TABLET BY MOUTH DAILY 30 tablet 11  . potassium chloride (K-DUR,KLOR-CON) 10 MEQ tablet Take 1 tablet (10 mEq total) by mouth daily. 30 tablet 5  . tiotropium (SPIRIVA) 18 MCG inhalation capsule Place 1 capsule (18 mcg total) into inhaler and inhale daily. (Patient not taking: Reported on 11/07/2016) 30 capsule 5   No current facility-administered medications on file prior to visit.     Allergies  Allergen Reactions  . Crestor [Rosuvastatin]     Muscle aches.    Assessment/Plan:  1. Hyperlipidemia - LDL remains uncontrolled at 151mg /dL on Lipitor 80mg  daily above goal of <70 mg/dL despite use of high-intensity statin therapy. Counseled pt on  lifestyle modifications including eating more fruits/vegetables and protein, and limiting fatty foods. Discussed medication options including either adding Zetia or a PCSK9 inhibitor - pt prefers to pursue PCSK9 inhibitor at this time. This is appropriate since expected 20% LDL reduction with Zetia therapy would not bring pt close to LDL goal. Counseled pt on use of the injections and possible ADEs. Will submit prior authorization to insurance for Praluent 75mg  every 2 weeks. Advised pt we would follow-up pending results of prior authorization and pursue patient assistance if needed for high copay.  Arrie Senate, PharmD PGY-2 Cardiology Pharmacy Resident Pager: [phone] 02/23/2017

## 2017-02-23 ENCOUNTER — Other Ambulatory Visit: Payer: PPO

## 2017-02-23 ENCOUNTER — Ambulatory Visit (INDEPENDENT_AMBULATORY_CARE_PROVIDER_SITE_OTHER): Payer: PPO | Admitting: Pharmacist

## 2017-02-23 DIAGNOSIS — E782 Mixed hyperlipidemia: Secondary | ICD-10-CM | POA: Diagnosis not present

## 2017-02-23 NOTE — Patient Instructions (Signed)
It was great to meet you today.  Continue taking atorvastatin 80mg  daily.  Try to incorporate more fruits and vegetables into diet as we discussed, and limit cholesterol.  We will call you once we hear from your insurance company.

## 2017-02-24 LAB — CBC WITH DIFFERENTIAL/PLATELET
Basophils Absolute: 0 10*3/uL (ref 0.0–0.2)
Basos: 1 %
EOS (ABSOLUTE): 0.1 10*3/uL (ref 0.0–0.4)
Eos: 2 %
Hematocrit: 46.3 % (ref 37.5–51.0)
Hemoglobin: 15.6 g/dL (ref 13.0–17.7)
Immature Grans (Abs): 0 10*3/uL (ref 0.0–0.1)
Immature Granulocytes: 0 %
Lymphocytes Absolute: 1.4 10*3/uL (ref 0.7–3.1)
Lymphs: 23 %
MCH: 28.7 pg (ref 26.6–33.0)
MCHC: 33.7 g/dL (ref 31.5–35.7)
MCV: 85 fL (ref 79–97)
Monocytes Absolute: 0.5 10*3/uL (ref 0.1–0.9)
Monocytes: 8 %
Neutrophils Absolute: 4.2 10*3/uL (ref 1.4–7.0)
Neutrophils: 66 %
Platelets: 202 10*3/uL (ref 150–379)
RBC: 5.44 x10E6/uL (ref 4.14–5.80)
RDW: 14.1 % (ref 12.3–15.4)
WBC: 6.1 10*3/uL (ref 3.4–10.8)

## 2017-02-24 LAB — COMPREHENSIVE METABOLIC PANEL
ALT: 24 IU/L (ref 0–44)
AST: 25 IU/L (ref 0–40)
Albumin/Globulin Ratio: 1.8 (ref 1.2–2.2)
Albumin: 4.6 g/dL (ref 3.5–5.5)
Alkaline Phosphatase: 97 IU/L (ref 39–117)
BUN/Creatinine Ratio: 10 (ref 9–20)
BUN: 13 mg/dL (ref 6–24)
Bilirubin Total: 1.2 mg/dL (ref 0.0–1.2)
CO2: 23 mmol/L (ref 20–29)
Calcium: 9.5 mg/dL (ref 8.7–10.2)
Chloride: 105 mmol/L (ref 96–106)
Creatinine, Ser: 1.32 mg/dL — ABNORMAL HIGH (ref 0.76–1.27)
GFR calc Af Amer: 70 mL/min/{1.73_m2} (ref >59)
GFR calc non Af Amer: 61 mL/min/{1.73_m2} (ref >59)
Globulin, Total: 2.5 g/dL (ref 1.5–4.5)
Glucose: 90 mg/dL (ref 65–99)
Potassium: 4.8 mmol/L (ref 3.5–5.2)
Sodium: 145 mmol/L — ABNORMAL HIGH (ref 134–144)
Total Protein: 7.1 g/dL (ref 6.0–8.5)

## 2017-02-24 LAB — LIPID PANEL
Chol/HDL Ratio: 4.6 ratio (ref 0.0–5.0)
Cholesterol, Total: 170 mg/dL (ref 100–199)
HDL: 37 mg/dL — ABNORMAL LOW (ref >39)
LDL Calculated: 113 mg/dL — ABNORMAL HIGH (ref 0–99)
Triglycerides: 100 mg/dL (ref 0–149)
VLDL Cholesterol Cal: 20 mg/dL (ref 5–40)

## 2017-02-24 LAB — LDL CHOLESTEROL, DIRECT: LDL Direct: 117 mg/dL — ABNORMAL HIGH (ref 0–99)

## 2017-02-26 ENCOUNTER — Telehealth: Payer: Self-pay | Admitting: Pharmacist

## 2017-02-26 DIAGNOSIS — E782 Mixed hyperlipidemia: Secondary | ICD-10-CM

## 2017-02-26 MED ORDER — ALIROCUMAB 75 MG/ML ~~LOC~~ SOPN: 1.0000 "pen " | PEN_INJECTOR | SUBCUTANEOUS | 11 refills | Status: DC

## 2017-02-26 NOTE — Telephone Encounter (Signed)
PA approved for Praluent - rx sent to specialty pharmacy to determine copay. Can pursue pt assistance if cost is prohibitive.

## 2017-03-02 NOTE — Telephone Encounter (Signed)
Copay affordable at $3.70 per month. Pt is aware and will call clinic once he receives his first shipment.

## 2017-03-06 ENCOUNTER — Other Ambulatory Visit: Payer: Self-pay | Admitting: Cardiovascular Disease

## 2017-03-18 NOTE — Addendum Note (Signed)
Addended by: Suhana Wilner E on: 03/18/2017 11:05 AM   Modules accepted: Orders

## 2017-03-18 NOTE — Telephone Encounter (Signed)
Called pt who reports that he has received his Praluent in the mail and has done 2 injections so far without problems. Scheduled f/u lab work in January to assess efficacy of Praluent.

## 2017-04-13 ENCOUNTER — Telehealth: Payer: Self-pay | Admitting: Pharmacist

## 2017-04-13 NOTE — Telephone Encounter (Signed)
Spoke to patient who states he will take his last injection tomorrow. He will not receive any additional medication prior to his PA running out. Will supply him samples to get through until able to renew PA after lab work.   He is aware to call to get samples next week before his next dose is due.

## 2017-05-01 ENCOUNTER — Other Ambulatory Visit: Payer: Self-pay | Admitting: Cardiovascular Disease

## 2017-05-06 ENCOUNTER — Other Ambulatory Visit: Payer: Self-pay | Admitting: Family Medicine

## 2017-05-11 ENCOUNTER — Other Ambulatory Visit: Payer: PPO

## 2017-05-11 DIAGNOSIS — E782 Mixed hyperlipidemia: Secondary | ICD-10-CM

## 2017-05-11 LAB — LIPID PANEL
Chol/HDL Ratio: 2 ratio (ref 0.0–5.0)
Cholesterol, Total: 83 mg/dL — ABNORMAL LOW (ref 100–199)
HDL: 41 mg/dL (ref >39)
LDL Calculated: 28 mg/dL (ref 0–99)
Triglycerides: 68 mg/dL (ref 0–149)
VLDL Cholesterol Cal: 14 mg/dL (ref 5–40)

## 2017-05-11 LAB — HEPATIC FUNCTION PANEL
ALT: 21 IU/L (ref 0–44)
AST: 21 IU/L (ref 0–40)
Albumin: 4.3 g/dL (ref 3.5–5.5)
Alkaline Phosphatase: 101 IU/L (ref 39–117)
Bilirubin Total: 0.6 mg/dL (ref 0.0–1.2)
Bilirubin, Direct: 0.24 mg/dL (ref 0.00–0.40)
Total Protein: 6.8 g/dL (ref 6.0–8.5)

## 2017-05-26 DIAGNOSIS — G894 Chronic pain syndrome: Secondary | ICD-10-CM | POA: Diagnosis not present

## 2017-05-26 DIAGNOSIS — M79672 Pain in left foot: Secondary | ICD-10-CM | POA: Diagnosis not present

## 2017-05-26 DIAGNOSIS — G8921 Chronic pain due to trauma: Secondary | ICD-10-CM | POA: Diagnosis not present

## 2017-05-26 DIAGNOSIS — M25572 Pain in left ankle and joints of left foot: Secondary | ICD-10-CM | POA: Diagnosis not present

## 2017-05-26 DIAGNOSIS — Z79899 Other long term (current) drug therapy: Secondary | ICD-10-CM | POA: Diagnosis not present

## 2017-05-26 DIAGNOSIS — Z5181 Encounter for therapeutic drug level monitoring: Secondary | ICD-10-CM | POA: Diagnosis not present

## 2017-06-01 ENCOUNTER — Other Ambulatory Visit: Payer: Self-pay | Admitting: Cardiovascular Disease

## 2017-06-01 ENCOUNTER — Other Ambulatory Visit: Payer: Self-pay | Admitting: Family Medicine

## 2017-06-02 ENCOUNTER — Telehealth: Payer: Self-pay | Admitting: Cardiovascular Disease

## 2017-06-02 NOTE — Progress Notes (Signed)
Cardiology Office Note:    Date:  06/03/2017   ID:  [name], DOB 03-Apr-1961, MRN [id number]  PCP:  Susy Frizzle, MD  Cardiologist:  Mertie Moores, MD   Referring MD: Susy Frizzle, MD   Chief Complaint  Patient presents with  . Follow-up    CAD    History of Present Illness:    [name] is a 57 y.o. male with a past medical history significant for CAD S/P MI and CABG (2010) and PCI (2011), PVD S/P iliac stenting, hypertension, hyperlipidemia and COPD.  Cardiac catheterization in 2014 revealed an occluded RCA saphenous vein graft at the origin with a patent RCA stent.  His other grafts were patent as well.  The patient was admitted for evaluation of chest pain in 05/2016.  A cardiac catheterization in 05/2016 revealed anatomy unchanged from his previous cath in 2014.  All of his grafts were patent except his RCA vein graft which was demonstrated to be occluded 4 years prior.  His RCA stent was patent and he had normal LV function.  He was to be treated for noncardiac chest pain.  [name] is here today for follow up alone. He denies chest pain/disomfort. Has shortness of breath with more strenuous exertion, has been long standing, worse over the last year. He can climb a flight of stairs easily. He also has some PND, no orthponea. He has been attributing this to his previous smoking, uses inhalers regularly. No edema. Avoids salt in diet. He does admit to loud snoring and daytime fatigue. He had a sleep study years ago that did not show sleep apnea.   Has bilateral leg pain, better with walking and worse after he finishes walking and sits down. For about 6-7 months. He tried stopping statin for a month with no improvement. He has been feeling fatigued and not as active as he used to be. He has not been to see the vascular surgeon in several years.    Past Medical History:  Diagnosis Date  . Acute MI, inferior wall ([place]) 04/2009  . Chest pain   . COPD (chronic  obstructive pulmonary disease) ([place])   . Coronary artery disease    STATUS POST CABG 01/2009  . Dyslipidemia   . Hyperlipidemia   . Hypertension   . Orthostatic hypotension   . Syncope and collapse     Past Surgical History:  Procedure Laterality Date  . ANKLE SURGERY     x 2  . COLONOSCOPY  April 2013  . CORONARY ANGIOPLASTY    . CORONARY ARTERY BYPASS GRAFT  2010  . FOOT SURGERY    . HERNIA REPAIR  01/23/12   umb hernia  . Kidney stimulator    . LEFT HEART CATH AND CORS/GRAFTS ANGIOGRAPHY N/A 06/02/2016   Procedure: Left Heart Cath and Cors/Grafts Angiography;  Surgeon: Lorretta Harp, MD;  Location: [facility];  Service: Cardiovascular;  Laterality: N/A;  . LEFT HEART CATHETERIZATION WITH CORONARY/GRAFT ANGIOGRAM N/A 07/08/2012   Procedure: LEFT HEART CATHETERIZATION WITH Beatrix Fetters;  Surgeon: Thayer Headings, MD;  Location: [facility];  Service: Cardiovascular;  Laterality: N/A;  . PR VEIN BYPASS GRAFT,AORTO-FEM-POP    . STERNAL WIRES REMOVAL  07/08/2011   Procedure: STERNAL WIRES REMOVAL;  Surgeon: Grace Isaac, MD;  Location: [facility];  Service: Open Heart Surgery;  Laterality: N/A;  removal of 4th wire    Current Medications: Current Meds  Medication Sig  . Alirocumab ([place])  75 MG/ML SOPN Inject 1 pen into the skin every 14 (fourteen) days.  Marland Kitchen aspirin EC 81 MG tablet Take 1 tablet (81 mg total) by mouth every morning.  Marland Kitchen atorvastatin (LIPITOR) 80 MG tablet TAKE 1 TABLET BY MOUTH EVERY NIGHT AT BEDTIME  . clopidogrel (PLAVIX) 75 MG tablet TAKE 1 TABLET BY MOUTH EVERY DAY  . cyclobenzaprine (FLEXERIL) 5 MG tablet Take 1 tablet (5 mg total) by mouth 3 (three) times daily as needed (chest pain).  Marland Kitchen ezetimibe (ZETIA) 10 MG tablet Take 1 tablet (10 mg total) by mouth daily.  Marland Kitchen gabapentin (NEURONTIN) 400 MG capsule Take 2 capsules (800 mg total) by mouth 2 (two) times daily.  . hydrochlorothiazide (HYDRODIURIL) 25 MG tablet TAKE 1 TABLET BY MOUTH  EVERY MORNING  . losartan (COZAAR) 100 MG tablet TAKE 1 TABLET BY MOUTH DAILY  . potassium chloride (K-DUR,KLOR-CON) 10 MEQ tablet Take 1 tablet (10 mEq total) by mouth daily.  Marland Kitchen PROAIR HFA 108 (90 Base) MCG/ACT inhaler INHALE 2 PUFFS INTO THE LUNGS EVERY 4 HOURS AS NEEDED FOR WHEEZING OR SHORTNESS OF BREATH     Allergies:   Crestor [rosuvastatin]   Social History   Socioeconomic History  . Marital status: Married    Spouse name: None  . Number of children: 2  . Years of education: None  . Highest education level: None  Social Needs  . Financial resource strain: None  . Food insecurity - worry: None  . Food insecurity - inability: None  . Transportation needs - medical: None  . Transportation needs - non-medical: None  Occupational History  . Occupation: Disability  Tobacco Use  . Smoking status: Former Smoker    Packs/day: 3.00    Years: 30.00    Pack years: 90.00    Types: Cigarettes, Cigars    Last attempt to quit: 01/26/2009    Years since quitting: 8.3  . Smokeless tobacco: Never Used  Substance and Sexual Activity  . Alcohol use: No  . Drug use: No  . Sexual activity: Not Currently  Other Topics Concern  . None  Social History Narrative  . None     Family History: The patient's family history includes Cancer in his mother; Coronary artery disease (age of onset: 68) in his father; Diabetes in his father and mother; Heart disease in his father; Hyperlipidemia in his father and mother; Hypertension in his brother, father, and mother. ROS:   Please see the history of present illness.     All other systems reviewed and are negative.  EKGs/Labs/Other Studies Reviewed:    The following studies were reviewed today:  Left Heart Cath and Cors/Grafts Angiography  06/02/2016  Conclusion    Prox RCA lesion, 0 %stenosed.  Ost RPDA lesion, 50 %stenosed.  Ost Ramus to Ramus lesion, 100 %stenosed.  Mid LAD lesion, 70 %stenosed.  SVG.  SVG.  SVG.  Origin  lesion, 100 %stenosed.  The left ventricular systolic function is normal.  LV end diastolic pressure is normal.   IMPRESSION: [name] anatomy is unchanged from his last cardiac catheterization performed 2014. All his grafts are patent except his RCA vein graft which was demonstrated to be occluded 4 years ago. His RCA stent is patent as well. He has normal LV function. There were no protocol per lesions". He will be treated for "noncardiac chest pain". His right common femoral arterial puncture site was sealed with the "MYNX device". The patient left the lab in stable condition.   EKG:  EKG is not ordered today.  Recent Labs: 02/23/2017: BUN 13; Creatinine, Ser 1.32; Hemoglobin 15.6; Platelets 202; Potassium 4.8; Sodium 145 05/11/2017: ALT 21   Recent Lipid Panel    Component Value Date/Time   CHOL 83 (L) 05/11/2017 0902   TRIG 68 05/11/2017 0902   HDL 41 05/11/2017 0902   CHOLHDL 2.0 05/11/2017 0902   CHOLHDL 5.3 (H) 11/06/2016 0849   VLDL 24 11/06/2016 0849   LDLCALC 28 05/11/2017 0902   LDLDIRECT 117 (H) 02/23/2017 1549    Physical Exam:    VS:  BP (!) 170/100- rechecked by me after rest- 138/70   Pulse 66   Ht 5\' 9"  (1.753 m)   Wt 258 lb 6.4 oz (117.2 kg)   SpO2 96%   BMI 38.16 kg/m     Wt Readings from Last 3 Encounters:  06/03/17 258 lb 6.4 oz (117.2 kg)  11/07/16 255 lb (115.7 kg)  06/03/16 257 lb 4.8 oz (116.7 kg)     Physical Exam  Constitutional: He is oriented to person, place, and time. He appears well-developed and well-nourished. No distress.  HENT:  Head: Normocephalic and atraumatic.  Neck: Normal range of motion. Neck supple. No JVD present.  Cardiovascular: Normal rate, regular rhythm, normal heart sounds and intact distal pulses. Exam reveals no gallop and no friction rub.  No murmur heard. Pulmonary/Chest: Effort normal and breath sounds normal. No respiratory distress. He has no wheezes. He has no rales.  Abdominal: Soft. Bowel sounds are  normal. He exhibits no distension. There is no tenderness.  Musculoskeletal: Normal range of motion. He exhibits no edema.  Neurological: He is alert and oriented to person, place, and time.  Skin: Skin is warm and dry.  Psychiatric: He has a normal mood and affect. His behavior is normal. Thought content normal.     ASSESSMENT:    1. Coronary artery disease due to lipid rich plaque   2. Essential hypertension   3. Mixed hyperlipidemia   4. Peripheral vascular disease ([place])    PLAN:    In order of problems listed above:  CAD: Status post CABG in 2010 and status post inferior wall MI in 2011 with multiple stents to the native RCA.  Patient on aspirin 81 mg, Plavix 75 mg, high intensity statin, Zetia, ARB and PCSK 9 inhibitor. No chest discomfort but has mild DOE, long standing, slightly worse over the last year. Not exercising. Advised to start easy walking. He is limited by leg pain.   DOE: Has mild DOE. Awakens night short of breath no orthopnea type description. No edema or JVD. Lungs clear. Will check BNP. If elevated will add some lasix, possibly 20 mg 3 times per week since HCTZ seemed to cause Hypotension and dizziness. His awakening at night could be related to sleep apnea. He admits to loud snoring and daytime sleepiness. Had normal sleep study years ago. He would like to repeat sleep study. Will order.   Hypertension: initial BP 170/100. Recheck after rest 138/70. At home usually 125-130. He stopped HCTZ about a month ago because it dropped his BP and caused dizziness. Will keep HCTZ off. Will check BMet.   Hyperlipidemia: Patient with history of continued elevated LDL of 151 while on high-dose atorvastatin.  Currently treated with atorvastatin 80 mg, Zetia 10 mg and Praluent 75 mg injection every 2 weeks.  Lipid panel on 05/11/17 showed LDL of 28 which is at goal of less than 70 mg/dL.  Continue current therapy  Peripheral vascular  disease status post iliac stenting: he is  complaining of bilateral leg pain, better with walking and worse when he sits after walking, sometimes severe with riding in the car. It limits his activity. 2+ pedal pulses. Could be neurogenic claudication. Pt has not seen vascular surgeon in several years. Will arrange for him to be evaluated at [facility].    Medication Adjustments/Labs and Tests Ordered: Current medicines are reviewed at length with the patient today.  Concerns regarding medicines are outlined above. Labs and tests ordered and medication changes are outlined in the patient instructions below:  There are no Patient Instructions on file for this visit.   Signed, Daune Perch, NP  06/03/2017 11:22 AM    [place]

## 2017-06-02 NOTE — Telephone Encounter (Signed)
Lab results and plan of care reviewed with patient who verbalized understanding and agreement

## 2017-06-02 NOTE — Telephone Encounter (Signed)
Called patient and left message that I will call him back later today

## 2017-06-02 NOTE — Telephone Encounter (Signed)
New message ° ° ° °Patient calling for lab results °

## 2017-06-03 ENCOUNTER — Telehealth: Payer: Self-pay | Admitting: *Deleted

## 2017-06-03 ENCOUNTER — Encounter: Payer: Self-pay | Admitting: Physician Assistant

## 2017-06-03 ENCOUNTER — Ambulatory Visit (INDEPENDENT_AMBULATORY_CARE_PROVIDER_SITE_OTHER): Payer: PPO | Admitting: Cardiology

## 2017-06-03 VITALS — BP 170/100 | HR 66 | Ht 69.0 in | Wt 258.4 lb

## 2017-06-03 DIAGNOSIS — R5383 Other fatigue: Secondary | ICD-10-CM

## 2017-06-03 DIAGNOSIS — R0602 Shortness of breath: Secondary | ICD-10-CM

## 2017-06-03 DIAGNOSIS — I2583 Coronary atherosclerosis due to lipid rich plaque: Secondary | ICD-10-CM | POA: Diagnosis not present

## 2017-06-03 DIAGNOSIS — R0683 Snoring: Secondary | ICD-10-CM

## 2017-06-03 DIAGNOSIS — I2510 Atherosclerotic heart disease of native coronary artery without angina pectoris: Secondary | ICD-10-CM | POA: Diagnosis not present

## 2017-06-03 DIAGNOSIS — I10 Essential (primary) hypertension: Secondary | ICD-10-CM | POA: Diagnosis not present

## 2017-06-03 DIAGNOSIS — E782 Mixed hyperlipidemia: Secondary | ICD-10-CM

## 2017-06-03 DIAGNOSIS — I739 Peripheral vascular disease, unspecified: Secondary | ICD-10-CM

## 2017-06-03 LAB — BASIC METABOLIC PANEL
BUN/Creatinine Ratio: 10 (ref 9–20)
BUN: 12 mg/dL (ref 6–24)
CO2: 24 mmol/L (ref 20–29)
Calcium: 9.2 mg/dL (ref 8.7–10.2)
Chloride: 105 mmol/L (ref 96–106)
Creatinine, Ser: 1.22 mg/dL (ref 0.76–1.27)
GFR calc Af Amer: 77 mL/min/{1.73_m2} (ref >59)
GFR calc non Af Amer: 67 mL/min/{1.73_m2} (ref >59)
Glucose: 110 mg/dL — ABNORMAL HIGH (ref 65–99)
Potassium: 4.6 mmol/L (ref 3.5–5.2)
Sodium: 143 mmol/L (ref 134–144)

## 2017-06-03 LAB — TSH: TSH: 2.54 u[IU]/mL (ref 0.450–4.500)

## 2017-06-03 LAB — PRO B NATRIURETIC PEPTIDE: NT-Pro BNP: 450 pg/mL — ABNORMAL HIGH (ref 0–121)

## 2017-06-03 NOTE — Telephone Encounter (Signed)
Left message to go over lab results and medications recommendations.

## 2017-06-03 NOTE — Telephone Encounter (Signed)
-----   Message from Daune Perch, NP sent at 06/03/2017  5:03 PM EST ----- Please let pt know that labs today showed normal kidney function, normal potassium. Thyroid function was normal  The BNP was elevated indicating some fluid overload. Please add lasix 20 mg. Start tomorrow with 2 pills then take one 20 mg pill on Mondays, Wednesdays, Fridays. Call if you do not see improvement so you can be seen earlier than the current follow up appt.   Daune Perch, NP

## 2017-06-03 NOTE — Telephone Encounter (Signed)
Sent to sleep pool. 

## 2017-06-03 NOTE — Patient Instructions (Addendum)
Medication Instructions:  1. STOP THE HCTZ 2. STOP POTASSIUM  3. CONTINUE ALL OTHER MEDICATIONS AS PRESCRIBED   Labwork: TODAY BMET, TSH, PRO BNP  Testing/Procedures: NONE ORDERED TODAY  Follow-Up: DR. Acie Fredrickson ON 09/07/17 @ 9:40   Any Other Special Instructions Will Be Listed Below (If Applicable).     If you need a refill on your cardiac medications before your next appointment, please call your pharmacy.

## 2017-06-03 NOTE — Telephone Encounter (Signed)
-----   Message from Michae Kava, [place] sent at 06/03/2017 11:42 AM EST ----- Regarding: [place] Gae Bon,  This pt saw Daune Perch, NP who was shadowing with Brynda Rim. PA today. She ordered a Split Night Sleep Study, dx snoring, fatigue. Please call the pt with date and time of sleep study. Thank you for all you do.  Thank you Arbie Cookey

## 2017-06-04 ENCOUNTER — Telehealth: Payer: Self-pay | Admitting: Cardiology

## 2017-06-04 ENCOUNTER — Other Ambulatory Visit: Payer: Self-pay | Admitting: *Deleted

## 2017-06-04 MED ORDER — FUROSEMIDE 20 MG PO TABS: 20.0000 mg | ORAL_TABLET | ORAL | 3 refills | Status: DC

## 2017-06-04 MED ORDER — FUROSEMIDE 20 MG PO TABS: ORAL_TABLET | ORAL | 3 refills | Status: DC

## 2017-06-04 MED ORDER — LOSARTAN POTASSIUM 100 MG PO TABS: 100.0000 mg | ORAL_TABLET | Freq: Every day | ORAL | 3 refills | Status: DC

## 2017-06-04 MED ORDER — CLOPIDOGREL BISULFATE 75 MG PO TABS: 75.0000 mg | ORAL_TABLET | Freq: Every day | ORAL | 3 refills | Status: DC

## 2017-06-04 NOTE — Telephone Encounter (Signed)
Spoke with Nicki Reaper, pharmacist at [facility] and clarified correct Rx information. He verbalized understanding and thanked me for the call.

## 2017-06-04 NOTE — Telephone Encounter (Signed)
F/u Call:  Patient returning call for lab results

## 2017-06-04 NOTE — Telephone Encounter (Signed)
New Message   Pt c/o medication issue:  1. Name of Medication: furosemide (LASIX)  2. How are you currently taking this medication (dosage and times per day)?   3. Are you having a reaction (difficulty breathing--STAT)? none  4. What is your medication issue? [name] is calling from [facility] to get clarification on the prescription. He states that he received two different scripts. He wants to know which one to fill.

## 2017-06-04 NOTE — Telephone Encounter (Signed)
Patient called and was very upset as he was seen yesterday for an office visit specifically to obtain refills on his medication. He states that he verbally informed the provided as well as the rooming person that he needed refills. He has been to the pharmacy twice and they claim that they did not receive any rx's except for the furosemide which was sent in earlier today. He is requesting losartan and clopidogrel. I made him aware that I would refill these for him and apologized for all of this. He verbalized his appreciation.

## 2017-06-04 NOTE — Telephone Encounter (Signed)
Reviewed lab results with patient and advised him to start furosemide 20 mg on Mondays, Wednesdays, and Fridays. I advised him to take 2 pills today when he picks up the Rx. I verified patient's pharmacy. I sent the incorrect Rx to the pharmacy first and called to make certain they have corrected Rx. Patient is aware of appropriate dosing instructions and thanked me for the call.

## 2017-06-09 ENCOUNTER — Telehealth: Payer: Self-pay | Admitting: *Deleted

## 2017-06-09 NOTE — Telephone Encounter (Signed)
-----   Message from Freada Bergeron, [place] sent at 06/03/2017  2:31 PM EST ----- Regarding: pre cert Thanks ----- Message ----- From: Michae Kava, CMA Sent: 06/03/2017  11:42 AM To: Freada Bergeron, CMA Subject: SPLIT NIGHT SLEEP STUDY                        [place] Gae Bon,  This pt saw Daune Perch, NP who was shadowing with Brynda Rim. PA today. She ordered a Split Night Sleep Study, dx snoring, fatigue. Please call the pt with date and time of sleep study. Thank you for all you do.  Thank you Arbie Cookey

## 2017-06-17 ENCOUNTER — Other Ambulatory Visit: Payer: Self-pay | Admitting: Family Medicine

## 2017-06-19 ENCOUNTER — Telehealth: Payer: Self-pay | Admitting: Cardiovascular Disease

## 2017-06-19 MED ORDER — ALIROCUMAB 75 MG/ML ~~LOC~~ SOPN: 1.0000 "pen " | PEN_INJECTOR | SUBCUTANEOUS | 11 refills | Status: DC

## 2017-06-19 NOTE — Telephone Encounter (Signed)
New message  He is out of the medication needs refill and Claiborne Billings is out of office today   *STAT* If patient is at the pharmacy, call can be transferred to refill team.   1. Which medications need to be refilled? (please list name of each medication and dose if known)  Alirocumab (PRALUENT) 75 MG/ML SOPN Inject 1 pen into the skin every 14 (fourteen) days.     2. Which pharmacy/location (including street and city if local pharmacy) is medication to be sent to? [facility] mail order   3. Do they need a 30 day or 90 day supply? [place]

## 2017-06-24 ENCOUNTER — Other Ambulatory Visit: Payer: Self-pay

## 2017-06-24 ENCOUNTER — Telehealth: Payer: Self-pay | Admitting: *Deleted

## 2017-06-24 DIAGNOSIS — I739 Peripheral vascular disease, unspecified: Secondary | ICD-10-CM

## 2017-06-24 NOTE — Telephone Encounter (Signed)
-----   Message from Almyra Free sent at 06/23/2017  2:16 PM EST ----- Regarding: RE: pre cert Pt can be scheduled. Let me know when and I will enter the precert info. Thanks, Amy ----- Message ----- From: Freada Bergeron, CMA Sent: 06/03/2017   2:31 PM To: [facility] Subject: pre cert                                       Thanks ----- Message ----- From: Michae Kava, CMA Sent: 06/03/2017  11:42 AM To: Freada Bergeron, CMA Subject: SPLIT NIGHT SLEEP STUDY                        Hi Gae Bon,  This pt saw Daune Perch, NP who was shadowing with Brynda Rim. PA today. She ordered a Split Night Sleep Study, dx snoring, fatigue. Please call the pt with date and time of sleep study. Thank you for all you do.  Thank you Arbie Cookey

## 2017-06-24 NOTE — Telephone Encounter (Signed)
Informed patient of upcoming sleep study and patient understanding was verbalized. Patient understands his sleep study is scheduled for Sunday July 12 2017. Patient understands his sleep study will be done at [facility] sleep lab. Patient understands he will receive a sleep packet in a week or so. Patient understands to call if he does not receive the sleep packet in a timely manner. Patient agrees with treatment and thanked me for call.

## 2017-07-01 ENCOUNTER — Ambulatory Visit (HOSPITAL_COMMUNITY)
Admission: RE | Admit: 2017-07-01 | Discharge: 2017-07-01 | Disposition: A | Discharge status: Home / Self Care | Payer: PPO | Source: Ambulatory Visit | Attending: Vascular Surgery | Admitting: Vascular Surgery

## 2017-07-01 DIAGNOSIS — I739 Peripheral vascular disease, unspecified: Secondary | ICD-10-CM | POA: Diagnosis not present

## 2017-07-02 ENCOUNTER — Encounter: Payer: PPO | Admitting: Vascular Surgery

## 2017-07-12 ENCOUNTER — Ambulatory Visit (HOSPITAL_BASED_OUTPATIENT_CLINIC_OR_DEPARTMENT_OTHER): Payer: PPO | Attending: Cardiology | Admitting: Internal Medicine

## 2017-07-12 VITALS — Ht 69.0 in | Wt 250.0 lb

## 2017-07-12 DIAGNOSIS — R5383 Other fatigue: Secondary | ICD-10-CM | POA: Insufficient documentation

## 2017-07-12 DIAGNOSIS — R0683 Snoring: Secondary | ICD-10-CM | POA: Insufficient documentation

## 2017-07-12 DIAGNOSIS — G4733 Obstructive sleep apnea (adult) (pediatric): Secondary | ICD-10-CM | POA: Insufficient documentation

## 2017-07-14 NOTE — Procedures (Signed)
NAME: [name] DATE OF BIRTH:  1960/08/12 MEDICAL RECORD NUMBER [id number]  LOCATION: [facility]  PHYSICIAN: Traci Turner  DATE OF STUDY: 07/12/2017  SLEEP STUDY TYPE: Positive Airway Pressure Titration               REFERRING PHYSICIAN: Daune Perch, NP   Gender: Male D.O.B: 17-Feb-1961 Age (years): 59 Referring Provider: Daune Perch NP Height (inches): 66 Interpreting Physician: Fransico Him MD, ABSM Weight (lbs): 250 RPSGT: Earney [place] BMI: 37 MRN: [id number] Neck Size: 18.00  CLINICAL INFORMATION Sleep Study Type: Split Night CPAP  Indication for sleep study: Fatigue, Snoring  Epworth Sleepiness Score: 10  SLEEP STUDY TECHNIQUE As per the AASM Manual for the Scoring of Sleep and Associated Events v2.3 (April 2016) with a hypopnea requiring 4% desaturations.  The channels recorded and monitored were frontal, central and occipital EEG, electrooculogram (EOG), submentalis EMG (chin), nasal and oral airflow, thoracic and abdominal wall motion, anterior tibialis EMG, snore microphone, electrocardiogram, and pulse oximetry. Continuous positive airway pressure (CPAP) was initiated when the patient met split night criteria and was titrated according to treat sleep-disordered breathing.  MEDICATIONS Medications self-administered by patient taken the night of the study : N/A  RESPIRATORY PARAMETERS Diagnostic Total AHI (/hr): 37.7  RDI (/hr):48.5  OA Index (/hr): 2.9  CA Index (/hr): 0.0 REM AHI (/hr): N/A  NREM AHI (/hr):37.7  Supine AHI (/hr):37.7  Non-supine AHI (/hr):N/A Min O2 Sat (%):88.0  Mean O2 (%): 95.9  Time below 88% (min):0.1   Titration Optimal Pressure (cm):11  AHI at Optimal Pressure (/hr):0.0  Min O2 at Optimal Pressure (%):93.0 Supine % at Optimal (%):43  Sleep % at Optimal (%):98   SLEEP ARCHITECTURE The recording time for the entire night was 428.0 minutes.  During a baseline period of 130.0 minutes,  the patient slept for 122.5 minutes in REM and nonREM, yielding a sleep efficiency of 94.2%%. Sleep onset after lights out was 4.4 minutes with a REM latency of N/A minutes. The patient spent 2.4%% of the night in stage N1 sleep, 97.6%% in stage N2 sleep, 0.0%% in stage N3 and 0.0%% in REM.  During the titration period of 289.1 minutes, the patient slept for 275.1 minutes in REM and nonREM, yielding a sleep efficiency of 95.2%%. Sleep onset after CPAP initiation was 6.9 minutes with a REM latency of 20.0 minutes. The patient spent 0.9%% of the night in stage N1 sleep, 72.2%% in stage N2 sleep, 0.0%% in stage N3 and 26.9%% in REM.  CARDIAC DATA The 2 lead EKG demonstrated sinus rhythm. The mean heart rate was 100.0 beats per minute. Other EKG findings include: None.  LEG MOVEMENT DATA The total Periodic Limb Movements of Sleep (PLMS) were 0. The PLMS index was 0.0 .  IMPRESSIONS - Severe obstructive sleep apnea occurred during the diagnostic portion of the study (AHI = 37.7/hour). An optimal PAP pressure was selected for this patient ( 11 cm of water) - No significant central sleep apnea occurred during the diagnostic portion of the study (CAI = 0.0/hour). - Severe oxygen desaturation was noted during the diagnostic portion of the study (Min O2 = 88.0%). - The patient snored with loud snoring volume during the diagnostic portion of the study. - No cardiac abnormalities were noted during this study. - Clinically significant periodic limb movements did not occur during sleep.  DIAGNOSIS - Obstructive Sleep Apnea (327.23 [G47.33 ICD-10])  RECOMMENDATIONS - Trial of CPAP therapy on 11 cm H2O with a  Small size Fisher&Paykel Full Face Mask Simplus mask and heated humidification. - Avoid alcohol, sedatives and other CNS depressants that may worsen sleep apnea and disrupt normal sleep architecture. - Sleep hygiene should be reviewed to assess factors that may improve sleep quality. - Weight  management and regular exercise should be initiated or continued. - Return to Sleep Center for re-evaluation after 10 weeks of therapy  [employer], [place] [facility]  ELECTRONICALLY SIGNED ON:  07/14/2017, 12:49 AM [place] PH: [phone]   FX: [phone] [place]

## 2017-07-20 ENCOUNTER — Telehealth: Payer: Self-pay | Admitting: *Deleted

## 2017-07-20 NOTE — Telephone Encounter (Signed)
LMTCB for results. 

## 2017-07-20 NOTE — Telephone Encounter (Signed)
-----   Message from Sueanne Margarita, MD sent at 07/14/2017 12:53 AM EDT ----- Please let patient know that they have significant sleep apnea and had successful PAP titration and will be set up with PAP unit.  Please let DME know that order is in EPIC.  Please set patient up for OV in 10 weeks

## 2017-07-27 ENCOUNTER — Other Ambulatory Visit: Payer: Self-pay | Admitting: Family Medicine

## 2017-08-06 ENCOUNTER — Ambulatory Visit (INDEPENDENT_AMBULATORY_CARE_PROVIDER_SITE_OTHER): Payer: PPO | Admitting: Vascular Surgery

## 2017-08-06 ENCOUNTER — Other Ambulatory Visit: Payer: Self-pay

## 2017-08-06 ENCOUNTER — Encounter: Payer: Self-pay | Admitting: Vascular Surgery

## 2017-08-06 VITALS — BP 134/94 | HR 59 | Resp 20 | Ht 69.0 in | Wt 256.0 lb

## 2017-08-06 DIAGNOSIS — I739 Peripheral vascular disease, unspecified: Secondary | ICD-10-CM

## 2017-08-07 NOTE — Progress Notes (Signed)
Patient is a 57 year old male with history of bilateral lower extremity leg pain.  He previously had bilateral common iliac stents placed in 2011.  He was last seen in our office 2015.  He currently still experiences pain in his legs.  Occurs in the front side of his legs after walking about a block but only about 70% of the time.  He does not really think walking helps with the pain.  He states that some days he can walk for several miles with no pain. He denies rest pain.  He denies nonhealing wounds.  Chronic medical problems include coronary artery disease COPD hyperlipidemia hypertension all of which have been stable.  He is on Plavix aspirin and a statin.  Social History   Socioeconomic History  . Marital status: Married    Spouse name: Not on file  . Number of children: 2  . Years of education: Not on file  . Highest education level: Not on file  Occupational History  . Occupation: Disability  Social Needs  . Financial resource strain: Not on file  . Food insecurity:    Worry: Not on file    Inability: Not on file  . Transportation needs:    Medical: Not on file    Non-medical: Not on file  Tobacco Use  . Smoking status: Former Smoker    Packs/day: 3.00    Years: 30.00    Pack years: 90.00    Types: Cigarettes, Cigars    Last attempt to quit: 01/26/2009    Years since quitting: 8.5  . Smokeless tobacco: Never Used  Substance and Sexual Activity  . Alcohol use: No  . Drug use: No  . Sexual activity: Not Currently  Lifestyle  . Physical activity:    Days per week: Not on file    Minutes per session: Not on file  . Stress: Not on file  Relationships  . Social connections:    Talks on phone: Not on file    Gets together: Not on file    Attends religious service: Not on file    Active member of club or organization: Not on file    Attends meetings of clubs or organizations: Not on file    Relationship status: Not on file  . Intimate partner violence:    Fear of current  or ex partner: Not on file    Emotionally abused: Not on file    Physically abused: Not on file    Forced sexual activity: Not on file  Other Topics Concern  . Not on file  Social History Narrative  . Not on file     Past Medical History:  Diagnosis Date  . Acute MI, inferior wall ([place]) 04/2009  . Chest pain   . COPD (chronic obstructive pulmonary disease) ([place])   . Coronary artery disease    STATUS POST CABG 01/2009  . Dyslipidemia   . Hyperlipidemia   . Hypertension   . Orthostatic hypotension   . Peripheral vascular disease ([place])   . Syncope and collapse    Current Outpatient Medications on File Prior to Visit  Medication Sig Dispense Refill  . Alirocumab (PRALUENT) 75 MG/ML SOPN Inject 1 pen into the skin every 14 (fourteen) days. 2 pen 11  . aspirin EC 81 MG tablet Take 1 tablet (81 mg total) by mouth every morning. 90 tablet 3  . atorvastatin (LIPITOR) 80 MG tablet TAKE 1 TABLET BY MOUTH EVERY NIGHT AT BEDTIME 90 tablet 0  . clopidogrel (PLAVIX)  75 MG tablet Take 1 tablet (75 mg total) by mouth daily. 90 tablet 3  . cyclobenzaprine (FLEXERIL) 5 MG tablet Take 1 tablet (5 mg total) by mouth 3 (three) times daily as needed (chest pain). 30 tablet 0  . ezetimibe (ZETIA) 10 MG tablet Take 1 tablet (10 mg total) by mouth daily. 90 tablet 3  . furosemide (LASIX) 20 MG tablet Take 1 tablet (20 mg total) by mouth 3 (three) times a week. Take 1 tab (20 mg) on Mondays, Wednesdays, and Fridays. 60 tablet 3  . gabapentin (NEURONTIN) 400 MG capsule Take 2 capsules (800 mg total) by mouth 2 (two) times daily. 120 capsule 6  . losartan (COZAAR) 100 MG tablet Take 1 tablet (100 mg total) by mouth daily. 90 tablet 3  . PROAIR HFA 108 (90 Base) MCG/ACT inhaler INHALE 2 PUFFS INTO THE LUNGS EVERY 4 HOURS AS NEEDED FOR WHEEZING OR SHORTNESS OF BREATH 25.5 g 0   No current facility-administered medications on file prior to visit.    View of systems: He denies shortness of breath.  He denies  chest pain.  Physical exam  Vitals:   08/06/17 0858 08/06/17 0901  BP: (!) 147/98 (!) 134/94  Pulse: (!) 59   Resp: 20   SpO2: 98%   Weight: 256 lb (116.1 kg)   Height: 5\' 9"  (1.753 m)     Abdomen: Soft nontender nondistended  neck no carotid bruits  chest clear to auscultation bilaterally  cardiac regular rate and rhythm without murmur Extremities: 2+ femoral dorsalis pedis on the left and right absent PT on the left 1+ PT on the right  Data: Patient had bilateral ABIs performed arch 09/2017.  These were greater than 1 and triphasic and normal bilaterally.  Toe pressure was 129 on the left 142 on the right also normal.  Assessment: Patient with bilateral lower extremity leg pain.  However, I doubt that this is of arterial etiology.  His iliac stents clinically appear to be widely patent.  He has easily palpable pulses in both feet.  Plan: The patient will follow up in 1 year for repeat ABIs.  He will return sooner if his symptoms progress.  Ruta Hinds, MD [facility] Office: [phone] Pager: [phone]

## 2017-08-13 ENCOUNTER — Encounter: Payer: Self-pay | Admitting: *Deleted

## 2017-08-13 NOTE — Telephone Encounter (Signed)
Called results lmtcb Will send a letter reminding patient to call office for test results.

## 2017-08-13 NOTE — Telephone Encounter (Addendum)
          Please let patient know that they have significant sleep apnea and had successful PAP titration and will be set up with PAP unit. Please let DME know that order is in EPIC. Please set patient up for OV in 10 weeks    Informed patient of titration results and verbalized understanding was indicated. Patient understands he has significant sleep apnea and had successful PAP titration and will be set up with PAP unit.  Upon patient request DME selection is [place]. Patient understands he will be contacted by [facility]  to set up his cpap. Patient understands to call if [facility] does not contact him with new setup in a timely manner. Patient understands they will be called once confirmation has been received from [facility] that they have received their new machine to schedule 10 week follow up appointment.  [facility] notified of new cpap order  Please add to airview Patient was grateful for the call and thanked me.

## 2017-08-14 ENCOUNTER — Encounter: Payer: Self-pay | Admitting: *Deleted

## 2017-08-14 NOTE — Telephone Encounter (Signed)
This encounter was created in error - please disregard.

## 2017-08-18 DIAGNOSIS — M25572 Pain in left ankle and joints of left foot: Secondary | ICD-10-CM | POA: Diagnosis not present

## 2017-08-18 DIAGNOSIS — M79672 Pain in left foot: Secondary | ICD-10-CM | POA: Diagnosis not present

## 2017-08-18 DIAGNOSIS — G8921 Chronic pain due to trauma: Secondary | ICD-10-CM | POA: Diagnosis not present

## 2017-08-18 DIAGNOSIS — G894 Chronic pain syndrome: Secondary | ICD-10-CM | POA: Diagnosis not present

## 2017-08-20 ENCOUNTER — Encounter: Payer: Self-pay | Admitting: Cardiovascular Disease

## 2017-08-26 DIAGNOSIS — G4733 Obstructive sleep apnea (adult) (pediatric): Secondary | ICD-10-CM | POA: Diagnosis not present

## 2017-09-04 ENCOUNTER — Other Ambulatory Visit: Payer: Self-pay

## 2017-09-04 ENCOUNTER — Other Ambulatory Visit: Payer: Self-pay | Admitting: Family Medicine

## 2017-09-07 ENCOUNTER — Encounter: Payer: Self-pay | Admitting: Cardiovascular Disease

## 2017-09-07 ENCOUNTER — Ambulatory Visit (INDEPENDENT_AMBULATORY_CARE_PROVIDER_SITE_OTHER): Payer: PPO | Admitting: Cardiovascular Disease

## 2017-09-07 ENCOUNTER — Other Ambulatory Visit: Payer: Self-pay

## 2017-09-07 VITALS — BP 152/100 | HR 48 | Ht 69.6 in | Wt 263.0 lb

## 2017-09-07 DIAGNOSIS — I10 Essential (primary) hypertension: Secondary | ICD-10-CM | POA: Diagnosis not present

## 2017-09-07 DIAGNOSIS — I739 Peripheral vascular disease, unspecified: Secondary | ICD-10-CM

## 2017-09-07 DIAGNOSIS — R0609 Other forms of dyspnea: Secondary | ICD-10-CM | POA: Diagnosis not present

## 2017-09-07 DIAGNOSIS — I2510 Atherosclerotic heart disease of native coronary artery without angina pectoris: Secondary | ICD-10-CM

## 2017-09-07 NOTE — Patient Instructions (Signed)
Medication Instructions:  Your physician recommends that you continue on your current medications as directed. Please refer to the Current Medication list given to you today.   Labwork: None Ordered   Testing/Procedures: Your physician has requested that you have an echocardiogram. Echocardiography is a painless test that uses sound waves to create images of your heart. It provides your doctor with information about the size and shape of your heart and how well your heart's chambers and valves are working. This procedure takes approximately one hour. There are no restrictions for this procedure.   Follow-Up: Your physician wants you to follow-up in: 6 months with a PA or Nurse Practitioner on Dr. Elmarie Shiley team.  You will receive a reminder letter in the mail two months in advance. If you don't receive a letter, please call our office to schedule the follow-up appointment.   If you need a refill on your cardiac medications before your next appointment, please call your pharmacy.   Thank you for choosing [facility]! Christen Bame, RN [phone]

## 2017-09-07 NOTE — Progress Notes (Signed)
Cardiology Office Note   Date:  09/07/2017   ID:  [name], DOB [birth year], MRN [id number]  PCP:  Susy Frizzle, MD  Cardiologist:   Mertie Moores, MD   Chief Complaint  Patient presents with  . Coronary Artery Disease   1. Coronary artery disease: He status post CABG in October , 2010. He status post inferior wall myocardial infarction in January, 2011 with multiple stents to the native right coronary artery Dr. Burt Knack 2. Peripheral vascular disease-status post iliac stenting 3. Dyslipidemia 4.. Hypertension  Notes prior to 2014:   [name] is a 57 yo with CAD and PVD. He has had problems with his left foot and has not been able to exercise. He continues to have lots of sternal wound pain whenever he moves his arm or lifts anything heavy. 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   [name] has had some muscle aches which resolved when he stopped his Crestor. He has hx of severe CAD and PVD. He has had a nagging left ankle injury that keeps him from walking much.  May 12 ,2014: He continues to have occasional CP and frequent episodes of dyspnea. He has been told that he has COPD. He is still in a left leg splint.  August 24, 2013:  [name] was admitted to the hospital in January with chest discomfort. A Lexiscan Myoview study was negative for ischemia. His left systolic function is normal. He's feeling a bit better. He has been in a boot for a chronic left ankle injury for the past several years.    07/07/2014   [name] is a 57 y.o. male who presents for HTN Has been sticking to his diet.  Walks every day.  Has lost 8-9 lbs over the past several weeks.  He is very lightheaded when he first gets up in the AM.  Has been getting worse over the past month or so.  Also has frequent episodes of orthostatic hypotension.  BP has been low on occasion. Today's BP is normal  - he is off his BP  meds.  Avoids salty foods.  No CP or dyspnea. Was told to stay off his BP meds until he saw me. He is feeling better off the BP meds    Nov. 21, 2016:  Doing well.   BP was normal when he left his house this am  A bit higher here.  Exercising a bit.  Still having left leg issus.    Sep 20, 2015:  Is having some episodes of chest tightness Occurs anytime - rest or activity . Last 30 seconds.  Has not tried an NTG.  Has more foot problems ,  Not able to exercise much .  Has lots of heartburn  Issues.   Seems different than angina   Sep 07, 2017  Lot is seen for follow up of his OSA  Was last seen by Pecolia Ades in February, 2019.  He is not had any episodes of chest pain or shortness of breath.  He is been diagnosed with obstructive sleep apnea.  He has a CPAP mask but is not tolerating it all that well.  He typically can tolerate it for about 4 hours at night.  Limited by an ankle injury,   [name] 4 years ago , had a compund fracture of left ankle Sees th eorthopedist at [facility]    BP at home is  normal .   Has significant DOE.  No angina   Past Medical History:  Diagnosis Date  . Acute MI, inferior wall ([place]) 04/2009  . Chest pain   . COPD (chronic obstructive pulmonary disease) ([place])   . Coronary artery disease    STATUS POST CABG 01/2009  . Dyslipidemia   . Hyperlipidemia   . Hypertension   . Orthostatic hypotension   . Peripheral vascular disease ([place])   . Syncope and collapse     Past Surgical History:  Procedure Laterality Date  . ANKLE SURGERY     x 2  . COLONOSCOPY  April 2013  . CORONARY ANGIOPLASTY    . CORONARY ARTERY BYPASS GRAFT  2010  . FOOT SURGERY    . HERNIA REPAIR  01/23/12   umb hernia  . Kidney stimulator    . LEFT HEART CATH AND CORS/GRAFTS ANGIOGRAPHY N/A 06/02/2016   Procedure: Left Heart Cath and Cors/Grafts Angiography;  Surgeon: Lorretta Harp, MD;  Location: [facility] [place];  Service: Cardiovascular;  Laterality: N/A;  . LEFT  HEART CATHETERIZATION WITH CORONARY/GRAFT ANGIOGRAM N/A 07/08/2012   Procedure: LEFT HEART CATHETERIZATION WITH Beatrix Fetters;  Surgeon: Thayer Headings, MD;  Location: [facility] CATH LAB;  Service: Cardiovascular;  Laterality: N/A;  . PR VEIN BYPASS GRAFT,AORTO-FEM-POP    . STERNAL WIRES REMOVAL  07/08/2011   Procedure: STERNAL WIRES REMOVAL;  Surgeon: Grace Isaac, MD;  Location: [facility];  Service: Open Heart Surgery;  Laterality: N/A;  removal of 4th wire     Current Outpatient Medications  Medication Sig Dispense Refill  . Alirocumab (PRALUENT) 75 MG/ML SOPN Inject 1 pen into the skin every 14 (fourteen) days. 2 pen 11  . aspirin EC 81 MG tablet Take 1 tablet (81 mg total) by mouth every morning. 90 tablet 3  . atorvastatin (LIPITOR) 80 MG tablet TAKE 1 TABLET BY MOUTH EVERY NIGHT AT BEDTIME 90 tablet 0  . clopidogrel (PLAVIX) 75 MG tablet Take 1 tablet (75 mg total) by mouth daily. 90 tablet 3  . cyclobenzaprine (FLEXERIL) 5 MG tablet Take 1 tablet (5 mg total) by mouth 3 (three) times daily as needed (chest pain). 30 tablet 0  . ezetimibe (ZETIA) 10 MG tablet Take 1 tablet (10 mg total) by mouth daily. 90 tablet 3  . furosemide (LASIX) 20 MG tablet Take 1 tablet (20 mg total) by mouth 3 (three) times a week. Take 1 tab (20 mg) on Mondays, Wednesdays, and Fridays. 60 tablet 3  . gabapentin (NEURONTIN) 400 MG capsule Take 2 capsules (800 mg total) by mouth 2 (two) times daily. 120 capsule 6  . losartan (COZAAR) 100 MG tablet Take 1 tablet (100 mg total) by mouth daily. 90 tablet 3  . oxyCODONE (OXYCONTIN) 20 mg 12 hr tablet Take 20 mg by mouth every 12 (twelve) hours as needed.     Marland Kitchen PROAIR HFA 108 (90 Base) MCG/ACT inhaler INHALE 2 PUFFS INTO THE LUNGS EVERY 4 HOURS AS NEEDED FOR WHEEZING OR SHORTNESS OF BREATH 25.5 g 0   No current facility-administered medications for this visit.     Allergies:   Crestor [rosuvastatin]    Social History:  The patient  reports that he quit  smoking about 8 years ago. His smoking use included cigarettes and cigars. He has a 90.00 pack-year smoking history. He has never used smokeless tobacco. He reports that he does not drink alcohol or use drugs.   Family History:  The patient's family history  includes Cancer in his mother; Coronary artery disease (age of onset: 37) in his father; Diabetes in his father and mother; Heart disease in his father; Hyperlipidemia in his father and mother; Hypertension in his brother, father, and mother.    ROS:  Noted in current hx ,. Otherwise negativ e   Physical Exam: Blood pressure (!) 152/100, pulse (!) 48, height 5' 9.6" (1.768 m), weight 263 lb (119.3 kg), SpO2 98 %.  GEN:  Well nourished, well developed in no acute distress HEENT: Normal NECK: No JVD; No carotid bruits LYMPHATICS: No lymphadenopathy CARDIAC: RRR  RESPIRATORY:  Clear to auscultation without rales, wheezing or rhonchi  ABDOMEN: Soft, non-tender, non-distended MUSCULOSKELETAL:  No edema; No deformity  SKIN: Warm and dry NEUROLOGIC:  Alert and oriented x 3   EKG: Sinus bradycardia at 48 beats minute.  Nonspecific ST and T wave changes.  Previous inferior wall myocardial infarction.  Recent Labs: 02/23/2017: Hemoglobin 15.6; Platelets 202 05/11/2017: ALT 21 06/03/2017: BUN 12; Creatinine, Ser 1.22; NT-Pro BNP 450; Potassium 4.6; Sodium 143; TSH 2.540    Lipid Panel    Component Value Date/Time   CHOL 83 (L) 05/11/2017 0902   TRIG 68 05/11/2017 0902   HDL 41 05/11/2017 0902   CHOLHDL 2.0 05/11/2017 0902   CHOLHDL 5.3 (H) 11/06/2016 0849   VLDL 24 11/06/2016 0849   LDLCALC 28 05/11/2017 0902   LDLDIRECT 117 (H) 02/23/2017 1549      Wt Readings from Last 3 Encounters:  09/07/17 263 lb (119.3 kg)  08/06/17 256 lb (116.1 kg)  07/12/17 250 lb (113.4 kg)      Other studies Reviewed: Additional studies/ records that were reviewed today include: . Review of the above records demonstrates:    ASSESSMENT AND  PLAN:  1. Coronary artery disease: He status post CABG in October , 2010. He status post inferior wall myocardial infarction in January, 2011 with multiple stents to the native right coronary artery Dr. Burt Knack,  . hes not having any angina   2. Peripheral vascular disease- stable   3. Dyslipidemia-   Currently on Pralulent. - labs look great   4.. Hypertension-   He is having some increasing shortness of breath with exertion.  We will get an echocardiogram to evaluate him for the possibility of diastolic dysfunction.  5.  Obstructive sleep [place]:  Having difficulty in tolerating the mask   Current medicines are reviewed at length with the patient today.  The patient does not have concerns regarding medicines.  The following changes have been made:  no change  Labs/ tests ordered today include:   Orders Placed This Encounter  Procedures  . EKG 12-Lead  . ECHOCARDIOGRAM COMPLETE    Disposition:   To see APP in 6 months .   I'll see him in 1 year     Signed, Mertie Moores, MD  09/07/2017 12:10 PM    [address], [place], [place]  [address] Phone: [phone]; Fax: [phone]

## 2017-09-09 DIAGNOSIS — K219 Gastro-esophageal reflux disease without esophagitis: Secondary | ICD-10-CM | POA: Diagnosis not present

## 2017-09-14 ENCOUNTER — Ambulatory Visit (HOSPITAL_COMMUNITY): Payer: PPO | Attending: Cardiology

## 2017-09-14 ENCOUNTER — Other Ambulatory Visit: Payer: Self-pay

## 2017-09-14 DIAGNOSIS — Z951 Presence of aortocoronary bypass graft: Secondary | ICD-10-CM | POA: Diagnosis not present

## 2017-09-14 DIAGNOSIS — E785 Hyperlipidemia, unspecified: Secondary | ICD-10-CM | POA: Diagnosis not present

## 2017-09-14 DIAGNOSIS — I739 Peripheral vascular disease, unspecified: Secondary | ICD-10-CM | POA: Diagnosis not present

## 2017-09-14 DIAGNOSIS — I059 Rheumatic mitral valve disease, unspecified: Secondary | ICD-10-CM | POA: Insufficient documentation

## 2017-09-14 DIAGNOSIS — I119 Hypertensive heart disease without heart failure: Secondary | ICD-10-CM | POA: Insufficient documentation

## 2017-09-14 DIAGNOSIS — J449 Chronic obstructive pulmonary disease, unspecified: Secondary | ICD-10-CM | POA: Diagnosis not present

## 2017-09-14 DIAGNOSIS — I252 Old myocardial infarction: Secondary | ICD-10-CM | POA: Diagnosis not present

## 2017-09-14 DIAGNOSIS — R079 Chest pain, unspecified: Secondary | ICD-10-CM | POA: Insufficient documentation

## 2017-09-14 DIAGNOSIS — I2510 Atherosclerotic heart disease of native coronary artery without angina pectoris: Secondary | ICD-10-CM | POA: Diagnosis not present

## 2017-09-14 DIAGNOSIS — R0609 Other forms of dyspnea: Secondary | ICD-10-CM

## 2017-09-14 DIAGNOSIS — R55 Syncope and collapse: Secondary | ICD-10-CM | POA: Diagnosis not present

## 2017-09-14 MED ORDER — PERFLUTREN LIPID MICROSPHERE
1.0000 mL | INTRAVENOUS | Status: AC | PRN
  Administered 2017-09-14: 2 mL via INTRAVENOUS

## 2017-09-15 ENCOUNTER — Telehealth: Payer: Self-pay | Admitting: Cardiovascular Disease

## 2017-09-15 NOTE — Telephone Encounter (Signed)
Reviewed echo results with patient who verbalized understanding and was thankful for the call.

## 2017-09-15 NOTE — Telephone Encounter (Signed)
New message ° ° ° °Patient calling for echo results °

## 2017-09-26 DIAGNOSIS — G4733 Obstructive sleep apnea (adult) (pediatric): Secondary | ICD-10-CM | POA: Diagnosis not present

## 2017-10-02 MED FILL — Perflutren Lipid Microsphere IV Susp 6.52 MG/ML: INTRAVENOUS | Qty: 2 | Status: AC

## 2017-10-26 DIAGNOSIS — G4733 Obstructive sleep apnea (adult) (pediatric): Secondary | ICD-10-CM | POA: Diagnosis not present

## 2017-11-12 DIAGNOSIS — G894 Chronic pain syndrome: Secondary | ICD-10-CM | POA: Diagnosis not present

## 2017-11-12 DIAGNOSIS — M25572 Pain in left ankle and joints of left foot: Secondary | ICD-10-CM | POA: Diagnosis not present

## 2017-11-12 DIAGNOSIS — G8921 Chronic pain due to trauma: Secondary | ICD-10-CM | POA: Diagnosis not present

## 2017-11-12 DIAGNOSIS — M79672 Pain in left foot: Secondary | ICD-10-CM | POA: Diagnosis not present

## 2017-11-23 ENCOUNTER — Other Ambulatory Visit: Payer: Self-pay | Admitting: Family Medicine

## 2017-11-26 DIAGNOSIS — G4733 Obstructive sleep apnea (adult) (pediatric): Secondary | ICD-10-CM | POA: Diagnosis not present

## 2018-01-26 ENCOUNTER — Ambulatory Visit (INDEPENDENT_AMBULATORY_CARE_PROVIDER_SITE_OTHER): Payer: PPO

## 2018-01-26 DIAGNOSIS — Z23 Encounter for immunization: Secondary | ICD-10-CM

## 2018-01-26 NOTE — Progress Notes (Signed)
Patient was in office for flu vaccine.Patient received vaccine in his right deltoid.patient tolerated well

## 2018-02-04 DIAGNOSIS — M79672 Pain in left foot: Secondary | ICD-10-CM | POA: Diagnosis not present

## 2018-02-04 DIAGNOSIS — G8921 Chronic pain due to trauma: Secondary | ICD-10-CM | POA: Diagnosis not present

## 2018-02-04 DIAGNOSIS — G894 Chronic pain syndrome: Secondary | ICD-10-CM | POA: Diagnosis not present

## 2018-02-04 DIAGNOSIS — M25572 Pain in left ankle and joints of left foot: Secondary | ICD-10-CM | POA: Diagnosis not present

## 2018-02-04 DIAGNOSIS — Z5181 Encounter for therapeutic drug level monitoring: Secondary | ICD-10-CM | POA: Diagnosis not present

## 2018-02-04 DIAGNOSIS — Z79899 Other long term (current) drug therapy: Secondary | ICD-10-CM | POA: Diagnosis not present

## 2018-02-05 ENCOUNTER — Encounter (HOSPITAL_COMMUNITY): Payer: Self-pay

## 2018-02-05 ENCOUNTER — Ambulatory Visit: Payer: PPO | Admitting: Family

## 2018-02-12 ENCOUNTER — Ambulatory Visit (INDEPENDENT_AMBULATORY_CARE_PROVIDER_SITE_OTHER): Payer: PPO | Admitting: Family Medicine

## 2018-02-12 VITALS — BP 180/112 | HR 76 | Temp 98.0°F | Resp 14 | Wt 252.0 lb

## 2018-02-12 DIAGNOSIS — Z125 Encounter for screening for malignant neoplasm of prostate: Secondary | ICD-10-CM | POA: Diagnosis not present

## 2018-02-12 DIAGNOSIS — E7800 Pure hypercholesterolemia, unspecified: Secondary | ICD-10-CM

## 2018-02-12 DIAGNOSIS — I739 Peripheral vascular disease, unspecified: Secondary | ICD-10-CM | POA: Diagnosis not present

## 2018-02-12 DIAGNOSIS — I10 Essential (primary) hypertension: Secondary | ICD-10-CM

## 2018-02-12 DIAGNOSIS — I2510 Atherosclerotic heart disease of native coronary artery without angina pectoris: Secondary | ICD-10-CM | POA: Diagnosis not present

## 2018-02-12 DIAGNOSIS — I2583 Coronary atherosclerosis due to lipid rich plaque: Secondary | ICD-10-CM | POA: Diagnosis not present

## 2018-02-12 MED ORDER — AMLODIPINE BESYLATE 10 MG PO TABS: 10.0000 mg | ORAL_TABLET | Freq: Every day | ORAL | 3 refills | Status: DC

## 2018-02-12 NOTE — Progress Notes (Signed)
Subjective:    Patient ID: [name], male    DOB: Sep 19, 1960, 57 y.o.   MRN: [id number]  HPI Patient is here today for complete physical exam.  While checking the patient in, noted his blood pressure to be 180/112.  I will allow the patient to sit for 10 minutes and rechecked and found to be similar.  He states that is always high when he first walks and but even at home his blood pressure has been in the 749S to 496 systolic.  Past medical history is significant for coronary artery disease status post coronary artery bypass grafting.  He denies any chest pain shortness of breath or dyspnea on exertion.  Patient states that he had a colonoscopy performed within the last year.  We do not have records of this.  This was apparently performed at an outside facility.  Patient cannot remember the doctor's name but will try to get that information to [place].  He also states that his prostate was checked when they did his colonoscopy.  He is not certain if he has had a PSA.  His immunization records are listed below: Immunization History  Administered Date(s) Administered  . Influenza Split 12/30/2010  . Influenza,inj,Quad PF,6+ Mos 01/13/2013, 05/23/2013, 01/12/2014, 04/27/2015, 01/08/2016, 06/02/2016, 01/06/2017, 01/26/2018  . Pneumococcal Conjugate-13 11/07/2016  . Pneumococcal Polysaccharide-23 05/23/2013  . Td 10/04/2010  . Tdap 10/04/2010   Past Medical History:  Diagnosis Date  . Acute MI, inferior wall ([place]) 04/2009  . Chest pain   . COPD (chronic obstructive pulmonary disease) ([place])   . Coronary artery disease    STATUS POST CABG 01/2009  . Dyslipidemia   . Hyperlipidemia   . Hypertension   . Orthostatic hypotension   . Peripheral vascular disease ([place])   . Syncope and collapse    Past Surgical History:  Procedure Laterality Date  . ANKLE SURGERY     x 2  . COLONOSCOPY  April 2013  . CORONARY ANGIOPLASTY    . CORONARY ARTERY BYPASS GRAFT  2010  . FOOT SURGERY    . HERNIA  REPAIR  01/23/12   umb hernia  . Kidney stimulator    . LEFT HEART CATH AND CORS/GRAFTS ANGIOGRAPHY N/A 06/02/2016   Procedure: Left Heart Cath and Cors/Grafts Angiography;  Surgeon: Lorretta Harp, MD;  Location: [facility];  Service: Cardiovascular;  Laterality: N/A;  . LEFT HEART CATHETERIZATION WITH CORONARY/GRAFT ANGIOGRAM N/A 07/08/2012   Procedure: LEFT HEART CATHETERIZATION WITH Beatrix Fetters;  Surgeon: Thayer Headings, MD;  Location: [facility] CATH [facility];  Service: Cardiovascular;  Laterality: N/A;  . PR VEIN BYPASS GRAFT,AORTO-FEM-POP    . STERNAL WIRES REMOVAL  07/08/2011   Procedure: STERNAL WIRES REMOVAL;  Surgeon: Grace Isaac, MD;  Location: [facility];  Service: Open Heart Surgery;  Laterality: N/A;  removal of 4th wire   Current Outpatient Medications on File Prior to Visit  Medication Sig Dispense Refill  . Alirocumab (PRALUENT) 75 MG/ML SOPN Inject 1 pen into the skin every 14 (fourteen) days. 2 pen 11  . aspirin EC 81 MG tablet Take 1 tablet (81 mg total) by mouth every morning. 90 tablet 3  . atorvastatin (LIPITOR) 80 MG tablet TAKE 1 TABLET BY MOUTH EVERY NIGHT AT BEDTIME 90 tablet 0  . clopidogrel (PLAVIX) 75 MG tablet Take 1 tablet (75 mg total) by mouth daily. 90 tablet 3  . cyclobenzaprine (FLEXERIL) 5 MG tablet Take 1 tablet (5 mg total) by mouth 3 (three)  times daily as needed (chest pain). 30 tablet 0  . ezetimibe (ZETIA) 10 MG tablet Take 1 tablet (10 mg total) by mouth daily. (Patient not taking: Reported on 02/12/2018) 90 tablet 3  . furosemide (LASIX) 20 MG tablet Take 1 tablet (20 mg total) by mouth 3 (three) times a week. Take 1 tab (20 mg) on Mondays, Wednesdays, and Fridays. 60 tablet 3  . gabapentin (NEURONTIN) 400 MG capsule Take 2 capsules (800 mg total) by mouth 2 (two) times daily. 120 capsule 6  . losartan (COZAAR) 100 MG tablet Take 1 tablet (100 mg total) by mouth daily. 90 tablet 3  . PROAIR HFA 108 (90 Base) MCG/ACT inhaler INHALE 2 PUFFS  INTO THE LUNGS EVERY 4 HOURS AS NEEDED FOR WHEEZING OR SHORTNESS OF BREATH 25.5 g 0   No current facility-administered medications on file prior to visit.    Allergies  Allergen Reactions  . Crestor [Rosuvastatin]     Muscle aches.   Social History   Socioeconomic History  . Marital status: Married    Spouse name: Not on file  . Number of children: 2  . Years of education: Not on file  . Highest education level: Not on file  Occupational History  . Occupation: Disability  Social Needs  . Financial resource strain: Not on file  . Food insecurity:    Worry: Not on file    Inability: Not on file  . Transportation needs:    Medical: Not on file    Non-medical: Not on file  Tobacco Use  . Smoking status: Former Smoker    Packs/day: 3.00    Years: 30.00    Pack years: 90.00    Types: Cigarettes, Cigars    Last attempt to quit: 01/26/2009    Years since quitting: 9.0  . Smokeless tobacco: Never Used  Substance and Sexual Activity  . Alcohol use: No  . Drug use: No  . Sexual activity: Not Currently  Lifestyle  . Physical activity:    Days per week: Not on file    Minutes per session: Not on file  . Stress: Not on file  Relationships  . Social connections:    Talks on phone: Not on file    Gets together: Not on file    Attends religious service: Not on file    Active member of club or organization: Not on file    Attends meetings of clubs or organizations: Not on file    Relationship status: Not on file  . Intimate partner violence:    Fear of current or ex partner: Not on file    Emotionally abused: Not on file    Physically abused: Not on file    Forced sexual activity: Not on file  Other Topics Concern  . Not on file  Social History Narrative  . Not on file   Family History  Problem Relation Age of Onset  . Coronary artery disease Father 87  . Diabetes Father   . Hyperlipidemia Father   . Heart disease Father        before age 38  . Hypertension Father     . Cancer Mother        uterine  . Hyperlipidemia Mother   . Diabetes Mother   . Hypertension Mother   . Hypertension Brother      Review of Systems  All other systems reviewed and are negative.      Objective:   Physical Exam  Constitutional: He is  oriented to person, place, and time. He appears well-developed and well-nourished. No distress.  HENT:  Head: Normocephalic and atraumatic.  Right Ear: External ear normal.  Left Ear: External ear normal.  Nose: Nose normal.  Mouth/Throat: Oropharynx is clear and moist. No oropharyngeal exudate.  Eyes: Pupils are equal, round, and reactive to light. Conjunctivae and EOM are normal. Right eye exhibits no discharge. Left eye exhibits no discharge. No scleral icterus.  Neck: Normal range of motion. Neck supple. No JVD present. No tracheal deviation present. No thyromegaly present.  Cardiovascular: Normal rate, regular rhythm, normal heart sounds and intact distal pulses. Exam reveals no gallop and no friction rub.  No murmur heard. Pulmonary/Chest: Effort normal and breath sounds normal. No stridor. No respiratory distress. He has no wheezes. He has no rales. He exhibits no tenderness.  Abdominal: Soft. Bowel sounds are normal. He exhibits no distension and no mass. There is no tenderness. There is no rebound and no guarding.  Musculoskeletal: Normal range of motion. He exhibits no edema, tenderness or deformity.  Lymphadenopathy:    He has no cervical adenopathy.  Neurological: He is alert and oriented to person, place, and time. He has normal reflexes. No cranial nerve deficit. He exhibits normal muscle tone. Coordination normal.  Skin: Skin is warm. No rash noted. He is not diaphoretic. No erythema. No pallor.  Psychiatric: He has a normal mood and affect. His behavior is normal. Judgment and thought content normal.  Vitals reviewed.         Assessment & Plan:  Coronary artery disease due to lipid rich plaque - Plan: CBC with  Differential/Platelet, COMPLETE METABOLIC PANEL WITH GFR, Lipid panel, PSA  Essential hypertension - Plan: amLODipine (NORVASC) 10 MG tablet  Pure hypercholesterolemia  Peripheral vascular disease ([place])  I am very concerned by the patient's blood pressure.  I recommended adding amlodipine 10 mg a day and rechecking blood pressure in 2 weeks.  I would like him to return fasting so that we can check a CBC, CMP, fasting lipid panel.  I would also screen the patient for prostate cancer with a PSA.  Goal LDL cholesterol is less than 70.  He is currently on a combination of Praluent and Lipitor.  Immunizations are up-to-date.  Colonoscopy is up-to-date.  The remainder of his preventive care is up-to-date.  He does have long-standing pain in his left leg due to a significant fracture he suffered of his distal fibula.  He is currently disabled due to this and he asked for a handicap placard which I completed for him.  He walks with a limp and has significant pain walking greater than 50 feet

## 2018-02-15 ENCOUNTER — Other Ambulatory Visit: Payer: PPO

## 2018-02-15 DIAGNOSIS — Z125 Encounter for screening for malignant neoplasm of prostate: Secondary | ICD-10-CM | POA: Diagnosis not present

## 2018-02-15 DIAGNOSIS — I2583 Coronary atherosclerosis due to lipid rich plaque: Secondary | ICD-10-CM | POA: Diagnosis not present

## 2018-02-15 DIAGNOSIS — I2510 Atherosclerotic heart disease of native coronary artery without angina pectoris: Secondary | ICD-10-CM | POA: Diagnosis not present

## 2018-02-16 ENCOUNTER — Encounter: Payer: Self-pay | Admitting: *Deleted

## 2018-02-16 LAB — COMPLETE METABOLIC PANEL WITH GFR
AG Ratio: 1.8 (calc) (ref 1.0–2.5)
ALT: 16 U/L (ref 9–46)
AST: 21 U/L (ref 10–35)
Albumin: 4.3 g/dL (ref 3.6–5.1)
Alkaline phosphatase (APISO): 86 U/L (ref 40–115)
BUN: 14 mg/dL (ref 7–25)
CO2: 28 mmol/L (ref 20–32)
Calcium: 9 mg/dL (ref 8.6–10.3)
Chloride: 107 mmol/L (ref 98–110)
Creat: 1.31 mg/dL (ref 0.70–1.33)
GFR, Est African American: 71 mL/min/{1.73_m2} (ref >=60)
GFR, Est Non African American: 61 mL/min/{1.73_m2} (ref >=60)
Globulin: 2.4 g/dL (calc) (ref 1.9–3.7)
Glucose, Bld: 110 mg/dL — ABNORMAL HIGH (ref 65–99)
Potassium: 3.8 mmol/L (ref 3.5–5.3)
Sodium: 143 mmol/L (ref 135–146)
Total Bilirubin: 0.8 mg/dL (ref 0.2–1.2)
Total Protein: 6.7 g/dL (ref 6.1–8.1)

## 2018-02-16 LAB — CBC WITH DIFFERENTIAL/PLATELET
Basophils Absolute: 52 cells/uL (ref 0–200)
Basophils Relative: 1 %
Eosinophils Absolute: 120 cells/uL (ref 15–500)
Eosinophils Relative: 2.3 %
HCT: 43.3 % (ref 38.5–50.0)
Hemoglobin: 14.3 g/dL (ref 13.2–17.1)
Lymphs Abs: 1399 cells/uL (ref 850–3900)
MCH: 28.1 pg (ref 27.0–33.0)
MCHC: 33 g/dL (ref 32.0–36.0)
MCV: 85.2 fL (ref 80.0–100.0)
MPV: 11.5 fL (ref 7.5–12.5)
Monocytes Relative: 11.2 %
Neutro Abs: 3047 cells/uL (ref 1500–7800)
Neutrophils Relative %: 58.6 %
Platelets: 188 10*3/uL (ref 140–400)
RBC: 5.08 10*6/uL (ref 4.20–5.80)
RDW: 12.4 % (ref 11.0–15.0)
Total Lymphocyte: 26.9 %
WBC mixed population: 582 cells/uL (ref 200–950)
WBC: 5.2 10*3/uL (ref 3.8–10.8)

## 2018-02-16 LAB — LIPID PANEL
Cholesterol: 73 mg/dL (ref <200)
HDL: 34 mg/dL — ABNORMAL LOW (ref >40)
LDL Cholesterol (Calc): 24 mg/dL (calc)
Non-HDL Cholesterol (Calc): 39 mg/dL (calc) (ref <130)
Total CHOL/HDL Ratio: 2.1 (calc) (ref <5.0)
Triglycerides: 72 mg/dL (ref <150)

## 2018-02-16 LAB — PSA: PSA: 0.4 ng/mL (ref <=4.0)

## 2018-03-10 ENCOUNTER — Ambulatory Visit: Payer: Self-pay | Admitting: Physician Assistant

## 2018-03-10 NOTE — Progress Notes (Deleted)
Cardiology Office Note:    Date:  03/10/2018   ID:  [name], DOB Sep 26, 1960, MRN [id number]  PCP:  Allen Frizzle, MD  Cardiologist:  Allen Moores, MD *** Electrophysiologist:  None  Vascular surgeon: Allen Hinds, MD  Referring MD: Allen Frizzle, MD   No chief complaint on file. ***  History of Present Illness:    [name] is a 57 y.o. male with coronary artery disease status post CABG in October 2010, inferior MI in January 2011 treated with DES to the proximal RCA and DES to the distal RCA peripheral arterial disease with prior iliac stenting, hypertension, hyperlipidemia, COPD, sleep apnea.  He was last seen by Dr. Acie [name] May 2019.  He noted shortness of breath and an echocardiogram demonstrated an EF of 55-60% with mild diastolic dysfunction.  ***  [name] ***  Prior CV studies:   The following studies were reviewed today:  *** Echo 09/14/2017 Mild LVH, mild focal basal septal hypertrophy, EF 55-60, normal wall motion, grade 1 diastolic dysfunction, MAC, mild LAE  ABIs 07/01/2017 Final Interpretation: Right: Resting right ankle-brachial index is within normal range. No evidence of significant right lower extremity arterial disease. The right toe-brachial index is normal. RT great toe pressure = 142 mmHg. Left: Resting left ankle-brachial index is within normal range. No evidence of significant left lower extremity arterial disease. The left toe-brachial index is normal. LT Great toe pressure = 129 mmHg.  Cardiac catheterization 06/02/2016 LAD mid 22 RI ostial 100 RCA proximal patent PDA ostial 50 LIMA-LAD patent SVG-RI patent SVG-OM 3 patent SVG-distal RCA 013 Normal LV systolic function   Carotid US 05/23/2013 Bilateral ICA 1-39  Nuclear stress test 06/22/2012 Low risk stress nuclear study with a small, moderate intensity, reversible inferobasal defect consistent with very mild inferior ischemia.  LV Ejection Fraction: 50%.  Past Medical  History:  Diagnosis Date  . Acute MI, inferior wall ([place]) 04/2009  . Chest pain   . COPD (chronic obstructive pulmonary disease) ([place])   . Coronary artery disease    STATUS POST CABG 01/2009  . Dyslipidemia   . Hyperlipidemia   . Hypertension   . Orthostatic hypotension   . Peripheral vascular disease ([place])   . Syncope and collapse    Surgical Hx: The patient  has a past surgical history that includes Ankle surgery; Sternal wires removal (07/08/2011); pr vein bypass graft,aorto-fem-pop; Coronary artery bypass graft (2010); Colonoscopy (April 2013); Foot surgery; Kidney stimulator; Hernia repair (01/23/12); left heart catheterization with coronary/graft angiogram (N/A, 07/08/2012); Coronary angioplasty; and LEFT HEART CATH AND CORS/GRAFTS ANGIOGRAPHY (N/A, 06/02/2016).   Current Medications: No outpatient medications have been marked as taking for the 03/10/18 encounter (Appointment) with Richardson Dopp T, PA-C.     Allergies:   Crestor [rosuvastatin]   Social History   Tobacco Use  . Smoking status: Former Smoker    Packs/day: 3.00    Years: 30.00    Pack years: 90.00    Types: Cigarettes, Cigars    Last attempt to quit: 01/26/2009    Years since quitting: 9.1  . Smokeless tobacco: Never Used  Substance Use Topics  . Alcohol use: No  . Drug use: No     Family Hx: The patient's family history includes Cancer in his mother; Coronary artery disease (age of onset: 58) in his father; Diabetes in his father and mother; Heart disease in his father; Hyperlipidemia in his father and mother; Hypertension in his brother, father, and mother.  ROS:  Please see the history of present illness.    ROS All other systems reviewed and are negative.   EKGs/Labs/Other Test Reviewed:    EKG:  EKG is *** ordered today.  The ekg ordered today demonstrates ***  Recent Labs: 06/03/2017: NT-Pro BNP 450; TSH 2.540 02/15/2018: ALT 16; BUN 14; Creat 1.31; Hemoglobin 14.3; Platelets 188; Potassium 3.8;  Sodium 143   Recent Lipid Panel Lab Results  Component Value Date/Time   CHOL 73 02/15/2018 08:19 AM   CHOL 83 (L) 05/11/2017 09:02 AM   TRIG 72 02/15/2018 08:19 AM   HDL 34 (L) 02/15/2018 08:19 AM   HDL 41 05/11/2017 09:02 AM   CHOLHDL 2.1 02/15/2018 08:19 AM   LDLCALC 24 02/15/2018 08:19 AM   LDLDIRECT 117 (H) 02/23/2017 03:49 PM    Physical Exam:    VS:  There were no vitals taken for this visit.    Wt Readings from Last 3 Encounters:  02/12/18 252 lb (114.3 kg)  09/07/17 263 lb (119.3 kg)  08/06/17 256 lb (116.1 kg)     ***Physical Exam  ASSESSMENT & PLAN:    Coronary artery disease involving native coronary artery of native heart without angina pectoris  Essential hypertension  Mixed hyperlipidemia  Peripheral vascular disease ([place])***  Dispo:  No follow-ups on file.   Medication Adjustments/Labs and Tests Ordered: Current medicines are reviewed at length with the patient today.  Concerns regarding medicines are outlined above.  Tests Ordered: No orders of the defined types were placed in this encounter.  Medication Changes: No orders of the defined types were placed in this encounter.   Signed, Richardson Dopp, PA-C  03/10/2018 8:09 AM    [facility] [place], [place],   [address] Phone: [phone]; Fax: [phone]

## 2018-03-16 ENCOUNTER — Encounter: Payer: Self-pay | Admitting: Physician Assistant

## 2018-04-05 ENCOUNTER — Other Ambulatory Visit: Payer: Self-pay | Admitting: Pharmacist

## 2018-04-05 NOTE — Patient Outreach (Addendum)
[facility]) Care Management  04/05/2018  [name] 24-Feb-1961 [id number]   Incoming call from [name] in response to the [facility] Medication Adherence Campaign. Speak with patient. HIPAA identifiers verified and verbal consent received.  [name] reports that he takes his losartan 100 mg once daily as directed. Denies any current missed doses or barriers to adherence. Reports that earlier in the year, he had been off of the losartan for a couple of week as his blood pressure regimen was being adjusted. However, reports that he is currently taking the losartan consistently and that his PCP added amlodipine 10 mg daily at his last office visit, on 02/12/18 per chart. Reports that he is returning to have his blood pressure checked again next week. Patient counseled on importance of medication adherence/blood pressure control.  Note per Epic Medication Dispensing Record, patient last had his losartan 100 mg filled on 02/03/18 and his amlodipine on 02/12/18, each for a 90 day supply.   Patient denies any further medication questions or concerns at this time. Will close pharmacy episode.  Harlow Asa, PharmD, [facility] [phone]

## 2018-04-19 ENCOUNTER — Other Ambulatory Visit: Payer: Self-pay | Admitting: Pharmacist

## 2018-04-19 MED ORDER — ALIROCUMAB 75 MG/ML ~~LOC~~ SOAJ: 75.0000 mg | SUBCUTANEOUS | 3 refills | Status: DC

## 2018-04-29 DIAGNOSIS — G8921 Chronic pain due to trauma: Secondary | ICD-10-CM | POA: Diagnosis not present

## 2018-04-29 DIAGNOSIS — M25572 Pain in left ankle and joints of left foot: Secondary | ICD-10-CM | POA: Diagnosis not present

## 2018-04-29 DIAGNOSIS — M79672 Pain in left foot: Secondary | ICD-10-CM | POA: Diagnosis not present

## 2018-04-29 DIAGNOSIS — G894 Chronic pain syndrome: Secondary | ICD-10-CM | POA: Diagnosis not present

## 2018-05-03 ENCOUNTER — Ambulatory Visit: Payer: Self-pay | Admitting: Physician Assistant

## 2018-05-03 NOTE — Progress Notes (Deleted)
Cardiology Office Note:    Date:  05/03/2018   ID:  [name], DOB [birth year], MRN [id number]  PCP:  Susy Frizzle, MD  Cardiologist:  Mertie Moores, MD *** Electrophysiologist:  None   Referring MD: Susy Frizzle, MD   No chief complaint on file. ***  History of Present Illness:    [name] is a 58 y.o. male with coronary artery disease s/p coronary artery bypass grafting in 01/2010 and inferior STEMI in 04/2009 treated with overlapping DES x 2 to the RCA (S-RCA was occluded), peripheral vascular disease s/p prior iliac stenting, hypertension, hyperlipidemia, COPD, OSA.  He was last seen by Dr. Acie Fredrickson in 08/2017.  ***  [name] ***  Prior CV studies:   The following studies were reviewed today:  *** Echocardiogram 09/14/2017 Mild LVH, mild focal basal septal hypertrophy, EF 55-60, normal wall motion, grade 1 diastolic dysfunction, MAC, mild LAE  ABI 07/01/2017 Final Interpretation: Right: Resting right ankle-brachial index is within normal range. No evidence of significant right lower extremity arterial disease. The right toe-brachial index is normal. RT great toe pressure = 142 mmHg. Left: Resting left ankle-brachial index is within normal range. No evidence of significant left lower extremity arterial disease. The left toe-brachial index is normal. LT Great toe pressure = 129 mmHg.  Cardiac Catheterization 06/02/16 LAD mid 70 RI ost 100 RCA prox stent patent; RPDA ost 71 L-LAD patent S-RI patent S-OM3 patent S-RCA 100 LVF normal   Carotid US 05/23/13 Bilat ICA 1-39    Past Medical History:  Diagnosis Date  . Acute MI, inferior wall ([place]) 04/2009  . Chest pain   . COPD (chronic obstructive pulmonary disease) ([place])   . Coronary artery disease    STATUS POST CABG 01/2009  . Dyslipidemia   . Hyperlipidemia   . Hypertension   . Orthostatic hypotension   . Peripheral vascular disease ([place])   . Syncope and collapse    Surgical Hx: The patient   has a past surgical history that includes Ankle surgery; Sternal wires removal (07/08/2011); pr vein bypass graft,aorto-fem-pop; Coronary artery bypass graft (2010); Colonoscopy (April 2013); Foot surgery; Kidney stimulator; Hernia repair (01/23/12); left heart catheterization with coronary/graft angiogram (N/A, 07/08/2012); Coronary angioplasty; and LEFT HEART CATH AND CORS/GRAFTS ANGIOGRAPHY (N/A, 06/02/2016).   Current Medications: No outpatient medications have been marked as taking for the 05/03/18 encounter (Appointment) with Richardson Dopp T, PA-C.     Allergies:   Crestor [rosuvastatin]   Social History   Tobacco Use  . Smoking status: Former Smoker    Packs/day: 3.00    Years: 30.00    Pack years: 90.00    Types: Cigarettes, Cigars    Last attempt to quit: 01/26/2009    Years since quitting: 9.2  . Smokeless tobacco: Never Used  Substance Use Topics  . Alcohol use: No  . Drug use: No     Family Hx: The patient's family history includes Cancer in his mother; Coronary artery disease (age of onset: 10) in his father; Diabetes in his father and mother; Heart disease in his father; Hyperlipidemia in his father and mother; Hypertension in his brother, father, and mother.  ROS:   Please see the history of present illness.    ROS All other systems reviewed and are negative.   EKGs/Labs/Other Test Reviewed:    EKG:  EKG is *** ordered today.  The ekg ordered today demonstrates ***  Recent Labs: 06/03/2017: NT-Pro BNP 450; TSH 2.540 02/15/2018: ALT 16;  BUN 14; Creat 1.31; Hemoglobin 14.3; Platelets 188; Potassium 3.8; Sodium 143   Recent Lipid Panel Lab Results  Component Value Date/Time   CHOL 73 02/15/2018 08:19 AM   CHOL 83 (L) 05/11/2017 09:02 AM   TRIG 72 02/15/2018 08:19 AM   HDL 34 (L) 02/15/2018 08:19 AM   HDL 41 05/11/2017 09:02 AM   CHOLHDL 2.1 02/15/2018 08:19 AM   LDLCALC 24 02/15/2018 08:19 AM   LDLDIRECT 117 (H) 02/23/2017 03:49 PM    Physical Exam:    VS:   There were no vitals taken for this visit.    Wt Readings from Last 3 Encounters:  02/12/18 252 lb (114.3 kg)  09/07/17 263 lb (119.3 kg)  08/06/17 256 lb (116.1 kg)     ***Physical Exam  ASSESSMENT & PLAN:    No diagnosis found.***  Dispo:  No follow-ups on file.   Medication Adjustments/Labs and Tests Ordered: Current medicines are reviewed at length with the patient today.  Concerns regarding medicines are outlined above.  Tests Ordered: No orders of the defined types were placed in this encounter.  Medication Changes: No orders of the defined types were placed in this encounter.   Signed, Richardson Dopp, PA-C  05/03/2018 8:56 AM    [facility] [place], [place], [place]  [address] Phone: [phone]; Fax: [phone]

## 2018-05-04 ENCOUNTER — Encounter: Payer: Self-pay | Admitting: Physician Assistant

## 2018-05-21 ENCOUNTER — Other Ambulatory Visit: Payer: Self-pay | Admitting: Cardiovascular Disease

## 2018-07-22 DIAGNOSIS — M79672 Pain in left foot: Secondary | ICD-10-CM | POA: Diagnosis not present

## 2018-07-22 DIAGNOSIS — Z5181 Encounter for therapeutic drug level monitoring: Secondary | ICD-10-CM | POA: Diagnosis not present

## 2018-07-22 DIAGNOSIS — G8921 Chronic pain due to trauma: Secondary | ICD-10-CM | POA: Diagnosis not present

## 2018-07-22 DIAGNOSIS — Z79899 Other long term (current) drug therapy: Secondary | ICD-10-CM | POA: Diagnosis not present

## 2018-07-22 DIAGNOSIS — M25572 Pain in left ankle and joints of left foot: Secondary | ICD-10-CM | POA: Diagnosis not present

## 2018-07-22 DIAGNOSIS — G894 Chronic pain syndrome: Secondary | ICD-10-CM | POA: Diagnosis not present

## 2018-08-20 ENCOUNTER — Other Ambulatory Visit: Payer: Self-pay | Admitting: Cardiovascular Disease

## 2018-08-23 ENCOUNTER — Other Ambulatory Visit: Payer: Self-pay | Admitting: Family Medicine

## 2018-10-06 ENCOUNTER — Other Ambulatory Visit: Payer: Self-pay

## 2018-10-06 ENCOUNTER — Encounter (HOSPITAL_COMMUNITY): Payer: Self-pay

## 2018-10-06 ENCOUNTER — Emergency Department (HOSPITAL_COMMUNITY)
Admission: EM | Admit: 2018-10-06 | Discharge: 2018-10-06 | Disposition: A | Discharge status: Home / Self Care | Payer: PPO | Attending: Emergency Medicine | Admitting: Emergency Medicine

## 2018-10-06 DIAGNOSIS — Z7901 Long term (current) use of anticoagulants: Secondary | ICD-10-CM | POA: Diagnosis not present

## 2018-10-06 DIAGNOSIS — Z7982 Long term (current) use of aspirin: Secondary | ICD-10-CM | POA: Insufficient documentation

## 2018-10-06 DIAGNOSIS — Z951 Presence of aortocoronary bypass graft: Secondary | ICD-10-CM | POA: Diagnosis not present

## 2018-10-06 DIAGNOSIS — J449 Chronic obstructive pulmonary disease, unspecified: Secondary | ICD-10-CM | POA: Insufficient documentation

## 2018-10-06 DIAGNOSIS — M79671 Pain in right foot: Secondary | ICD-10-CM | POA: Insufficient documentation

## 2018-10-06 DIAGNOSIS — Z87891 Personal history of nicotine dependence: Secondary | ICD-10-CM | POA: Diagnosis not present

## 2018-10-06 DIAGNOSIS — Z79899 Other long term (current) drug therapy: Secondary | ICD-10-CM | POA: Diagnosis not present

## 2018-10-06 DIAGNOSIS — I10 Essential (primary) hypertension: Secondary | ICD-10-CM | POA: Diagnosis not present

## 2018-10-06 DIAGNOSIS — I2510 Atherosclerotic heart disease of native coronary artery without angina pectoris: Secondary | ICD-10-CM | POA: Diagnosis not present

## 2018-10-06 MED ORDER — DICLOFENAC SODIUM 1 % TD GEL: 2.0000 g | Freq: Four times a day (QID) | TRANSDERMAL | 0 refills | Status: DC

## 2018-10-06 MED ORDER — PREDNISONE 50 MG PO TABS: 50.0000 mg | ORAL_TABLET | Freq: Every day | ORAL | 0 refills | Status: AC

## 2018-10-06 NOTE — ED Notes (Signed)
Patient given discharge instruction, verbalized understand. Patient ambulatory out of the department.  

## 2018-10-06 NOTE — ED Triage Notes (Addendum)
Pt presents to ED with complaints of right heel pain for 3 weeks. Pt denies injury. Pt states foot is worse in the morning when he wakes up.

## 2018-10-06 NOTE — Discharge Instructions (Signed)
Continue taking home medications as prescribed.  Take prednisone as prescribed.  Use voltaren gel for pain and swelling.  Continue taking you home pain medication and tylenol for further pain control.  Roll your foot out with a icy water bottle 2-3 times a day. Wear shoes with good support as often as possible.  Follow up with either the orthopedic doctor or the foot doctor for further evaluation.  Return to the ER with any new, worsening, or concerning symptoms.

## 2018-10-06 NOTE — ED Provider Notes (Signed)
[facility] EMERGENCY DEPARTMENT Provider Note   CSN: [id number] Arrival date & time: 10/06/18  1810    History   Chief Complaint Chief Complaint  Patient presents with  . Foot Pain    HPI [name] is a 58 y.o. male presenting for evaluation of right foot pain.  Patient states over the past month, he has had gradually worsening right foot pain.  Pain is on the sole of his foot.  It is present when he is walking in bare feet, improved when he is wearing shoes.  No pain at rest.  He denies fall, trauma, or injury.  He denies precipitating event.  He takes Percocet for pain, has not had any improvement with this.  He has not tried anything else.  He denies numbness of the foot.  He denies pain of the left foot.  He denies radiation of the pain, does not come up into his calf.  He denies change in activity recently, although states that he is very active is often on his feet.  Patient reports a history of hypertension, states his blood pressure is always high despite medication.  On further evaluation, patient states that he takes his blood pressure medication intermittently.     HPI  Past Medical History:  Diagnosis Date  . Acute MI, inferior wall ([place]) 04/2009  . Chest pain   . COPD (chronic obstructive pulmonary disease) ([place])   . Coronary artery disease    STATUS POST CABG 01/2009  . Dyslipidemia   . Hyperlipidemia   . Hypertension   . Orthostatic hypotension   . Peripheral vascular disease ([place])   . Syncope and collapse     Patient Active Problem List   Diagnosis Date Noted  . Mixed hyperlipidemia 02/23/2017  . Peripheral vascular disease ([place]) 06/01/2016  . Medication side effects 06/28/2014  . COPD (chronic obstructive pulmonary disease) ([place]) 05/24/2013  . Umbilical hernia 05/39/7673  . Atherosclerosis of native arteries of the extremities with intermittent claudication 10/03/2011  . Pain in limb 10/03/2011  . Painful sternal wire 06/30/2011  . Leg cramps  11/12/2010  . CAD (coronary artery disease)   . Hx of Inferior MI   . Essential hypertension   . Dyspnea   . Orthostatic hypotension   . History of syncope     Past Surgical History:  Procedure Laterality Date  . ANKLE SURGERY     x 2  . COLONOSCOPY  April 2013  . CORONARY ANGIOPLASTY    . CORONARY ARTERY BYPASS GRAFT  2010  . FOOT SURGERY    . HERNIA REPAIR  01/23/12   umb hernia  . Kidney stimulator    . LEFT HEART CATH AND CORS/GRAFTS ANGIOGRAPHY N/A 06/02/2016   Procedure: Left Heart Cath and Cors/Grafts Angiography;  Surgeon: Lorretta Harp, MD;  Location: [facility];  Service: Cardiovascular;  Laterality: N/A;  . LEFT HEART CATHETERIZATION WITH CORONARY/GRAFT ANGIOGRAM N/A 07/08/2012   Procedure: LEFT HEART CATHETERIZATION WITH Beatrix Fetters;  Surgeon: Thayer Headings, MD;  Location: [facility] CATH LAB;  Service: Cardiovascular;  Laterality: N/A;  . PR VEIN BYPASS GRAFT,AORTO-FEM-POP    . STERNAL WIRES REMOVAL  07/08/2011   Procedure: STERNAL WIRES REMOVAL;  Surgeon: Grace Isaac, MD;  Location: [facility];  Service: Open Heart Surgery;  Laterality: N/A;  removal of 4th wire        Home Medications    Prior to Admission medications   Medication Sig Start Date End Date Taking? Authorizing  Provider  albuterol (VENTOLIN HFA) 108 (90 Base) MCG/ACT inhaler INHALE 2 PUFFS INTO THE LUNGS EVERY 4 HOURS AS NEEDED FOR WHEEZING OR SHORTNESS OF BREATH 08/23/18   Susy Frizzle, MD  Alirocumab (PRALUENT) 75 MG/ML SOAJ Inject 75 mg into the skin every 14 (fourteen) days. 04/19/18   Nahser, Wonda Cheng, MD  amLODipine (NORVASC) 10 MG tablet Take 1 tablet (10 mg total) by mouth daily. 02/12/18   Susy Frizzle, MD  aspirin EC 81 MG tablet Take 1 tablet (81 mg total) by mouth every morning. 11/07/16   Susy Frizzle, MD  atorvastatin (LIPITOR) 80 MG tablet TAKE 1 TABLET BY MOUTH EVERY NIGHT AT BEDTIME 08/23/18   Susy Frizzle, MD  clopidogrel (PLAVIX) 75 MG tablet TAKE 1  TABLET(75 MG) BY MOUTH DAILY 05/24/18   Nahser, Wonda Cheng, MD  cyclobenzaprine (FLEXERIL) 5 MG tablet Take 1 tablet (5 mg total) by mouth 3 (three) times daily as needed (chest pain). 06/03/16   Donne Hazel, MD  diclofenac sodium (VOLTAREN) 1 % GEL Apply 2 g topically 4 (four) times daily. 10/06/18   Joshoa Shawler, PA-C  ezetimibe (ZETIA) 10 MG tablet Take 1 tablet (10 mg total) by mouth daily. Patient not taking: Reported on 02/12/2018 06/22/14   Susy Frizzle, MD  furosemide (LASIX) 20 MG tablet Take 1 tablet (20 mg total) by mouth 3 (three) times a week. Take 1 tab (20 mg) on Mondays, Wednesdays, and Fridays. 06/05/17   Daune Perch, NP  gabapentin (NEURONTIN) 400 MG capsule Take 2 capsules (800 mg total) by mouth 2 (two) times daily. 12/20/13   Elam Dutch, MD  losartan (COZAAR) 100 MG tablet TAKE 1 TABLET(100 MG) BY MOUTH DAILY 08/20/18   Nahser, Wonda Cheng, MD  predniSONE (DELTASONE) 50 MG tablet Take 1 tablet (50 mg total) by mouth daily for 5 days. 10/06/18 10/11/18  Otha Rickles, PA-C    Family History Family History  Problem Relation Age of Onset  . Coronary artery disease Father 9  . Diabetes Father   . Hyperlipidemia Father   . Heart disease Father        before age 33  . Hypertension Father   . Cancer Mother        uterine  . Hyperlipidemia Mother   . Diabetes Mother   . Hypertension Mother   . Hypertension Brother     Social History Social History   Tobacco Use  . Smoking status: Former Smoker    Packs/day: 3.00    Years: 30.00    Pack years: 90.00    Types: Cigarettes, Cigars    Last attempt to quit: 01/26/2009    Years since quitting: 9.6  . Smokeless tobacco: Never Used  Substance Use Topics  . Alcohol use: No  . Drug use: No     Allergies   Crestor [rosuvastatin]   Review of Systems Review of Systems  Musculoskeletal: Positive for arthralgias.  Neurological: Negative for numbness.     Physical Exam Updated Vital Signs BP (!)  183/98 (BP Location: Right Arm)   Pulse 61   Temp 98.3 F (36.8 C) (Oral)   Resp 20   Ht 5\' 9"  (1.753 m)   Wt 113.4 kg   SpO2 100%   BMI 36.92 kg/m   Physical Exam Vitals signs and nursing note reviewed.  Constitutional:      General: He is not in acute distress.    Appearance: He is well-developed.  HENT:  Head: Normocephalic and atraumatic.  Neck:     Musculoskeletal: Normal range of motion.  Pulmonary:     Effort: Pulmonary effort is normal.  Abdominal:     General: There is no distension.  Musculoskeletal: Normal range of motion.       Feet:     Comments: Palpation of the right plantar foot just distal to the heel.  No erythema, warmth, swelling.  Tender to palpation.  Full active range of motion of the ankle without pain.  Pedal pulses intact bilaterally.  Distal sensation intact. No ttp of the dorsal foot or ankle  Skin:    General: Skin is warm.     Capillary Refill: Capillary refill takes less than 2 seconds.     Findings: No rash.  Neurological:     Mental Status: He is alert and oriented to person, place, and time.      ED Treatments / Results  Labs (all labs ordered are listed, but only abnormal results are displayed) Labs Reviewed - No data to display  EKG None  Radiology No results found.  Procedures Procedures (including critical care time)  Medications Ordered in ED Medications - No data to display   Initial Impression / Assessment and Plan / ED Course  I have reviewed the triage vital signs and the nursing notes.  Pertinent labs & imaging results that were available during my care of the patient were reviewed by me and considered in my medical decision making (see chart for details).        Pt presenting for evaluation of gradually worsening right foot pain.  Physical exam reassuring, he is neurovascularly intact.  No traumatic event precipitating injury, low suspicion for bony abnormality, especially as there is no pain at rest and  with movement.  Pain only with ambulation or palpation.  As such, I do not believe x-rays would be beneficial, as I doubt fracture or dislocation.  Likely MSK related, consider plantar fasciitis versus tendinitis, specially if symptoms are improved with wearing shoes.  Discussed importance of arch support, treatment with anti-inflammatories, use of ice roller, and follow-up if symptoms are not improving.  Patient given resources for both orthopedics and podiatry.  At this time, patient appears safe for discharge.  Return precautions given.  Patient states he understands agrees plan.   Final Clinical Impressions(s) / ED Diagnoses   Final diagnoses:  Right foot pain    ED Discharge Orders         Ordered    predniSONE (DELTASONE) 50 MG tablet  Daily     10/06/18 1841    diclofenac sodium (VOLTAREN) 1 % GEL  4 times daily     10/06/18 1841           Franchot Heidelberg, PA-C 10/06/18 2044    Milton Ferguson, MD 10/07/18 2226

## 2018-10-14 ENCOUNTER — Ambulatory Visit (INDEPENDENT_AMBULATORY_CARE_PROVIDER_SITE_OTHER): Payer: PPO

## 2018-10-14 ENCOUNTER — Encounter: Payer: Self-pay | Admitting: Podiatry

## 2018-10-14 ENCOUNTER — Ambulatory Visit (INDEPENDENT_AMBULATORY_CARE_PROVIDER_SITE_OTHER): Payer: PPO | Admitting: Podiatry

## 2018-10-14 ENCOUNTER — Other Ambulatory Visit: Payer: Self-pay | Admitting: Podiatry

## 2018-10-14 ENCOUNTER — Other Ambulatory Visit: Payer: Self-pay

## 2018-10-14 VITALS — BP 181/93 | HR 60 | Temp 98.4°F | Resp 16

## 2018-10-14 DIAGNOSIS — G8921 Chronic pain due to trauma: Secondary | ICD-10-CM | POA: Diagnosis not present

## 2018-10-14 DIAGNOSIS — M722 Plantar fascial fibromatosis: Secondary | ICD-10-CM

## 2018-10-14 DIAGNOSIS — Z79899 Other long term (current) drug therapy: Secondary | ICD-10-CM | POA: Diagnosis not present

## 2018-10-14 DIAGNOSIS — Z5181 Encounter for therapeutic drug level monitoring: Secondary | ICD-10-CM | POA: Diagnosis not present

## 2018-10-14 DIAGNOSIS — M25572 Pain in left ankle and joints of left foot: Secondary | ICD-10-CM | POA: Diagnosis not present

## 2018-10-14 DIAGNOSIS — M79671 Pain in right foot: Secondary | ICD-10-CM | POA: Diagnosis not present

## 2018-10-14 DIAGNOSIS — G8929 Other chronic pain: Secondary | ICD-10-CM | POA: Diagnosis not present

## 2018-10-14 DIAGNOSIS — M79672 Pain in left foot: Secondary | ICD-10-CM | POA: Diagnosis not present

## 2018-10-14 NOTE — Progress Notes (Signed)
   Subjective:    Patient ID: [name], male    DOB: 01/29/1961, 58 y.o.   MRN: [id number]  HPI    Review of Systems  All other systems reviewed and are negative.      Objective:   Physical Exam        Assessment & Plan:

## 2018-10-14 NOTE — Patient Instructions (Signed)

## 2018-10-20 ENCOUNTER — Ambulatory Visit: Payer: PPO | Admitting: Orthopedic Surgery

## 2018-10-28 ENCOUNTER — Ambulatory Visit (INDEPENDENT_AMBULATORY_CARE_PROVIDER_SITE_OTHER): Payer: PPO | Admitting: Podiatry

## 2018-10-28 ENCOUNTER — Encounter: Payer: Self-pay | Admitting: Podiatry

## 2018-10-28 ENCOUNTER — Other Ambulatory Visit: Payer: Self-pay

## 2018-10-28 VITALS — Temp 97.1°F

## 2018-10-28 DIAGNOSIS — M722 Plantar fascial fibromatosis: Secondary | ICD-10-CM | POA: Diagnosis not present

## 2018-10-28 NOTE — Progress Notes (Signed)
Subjective:   Patient ID: [name], male   DOB: 58 y.o.   MRN: [id number]   HPI Patient presents stating had a lot of pain in his right heel that has improved some but is still sore if he does a lot of walking   ROS      Objective:  Physical Exam  Neurovascular status intact negative Homans sign noted with patient's right heel improved but still quite tender upon palpation     Assessment:  Fasciitis improved but present right     Plan:  HEP condition reviewed and I reinjected the fascia 3 mg Kenalog 5 mg Xylocaine instructed on continue physical therapy anti-inflammatories reappoint to recheck in 3 weeks and may require more aggressive treatment

## 2018-10-28 NOTE — Progress Notes (Signed)
Subjective:   Patient ID: [name], male   DOB: 58 y.o.   MRN: [id number]   HPI Patient presents with pain in the right heel of at least 1 months duration.  States is been very sore hard to walk and he is tried over-the-counter anti-inflammatories support without relief.  Patient does not smoke likes to be active   Review of Systems  All other systems reviewed and are negative.       Objective:  Physical Exam Vitals signs and nursing note reviewed.  Constitutional:      Appearance: He is well-developed.  Pulmonary:     Effort: Pulmonary effort is normal.  Musculoskeletal: Normal range of motion.  Skin:    General: Skin is warm.  Neurological:     Mental Status: He is alert.     Neurovascular status found to be intact muscle strength adequate range of motion within normal limits with exquisite discomfort plantar heel right insertional point tendon into the calcaneus with inflammation fluid around the medial band.  Patient is found to have good digital perfusion is well oriented x3 and is quite active     Assessment:  Acute plantar fasciitis right with pain     Plan:  H&P x-rays reviewed and today I injected the fascia 3 mg Kenalog 5 mg Xylocaine applied fascial brace instructed on physical therapy shoe gear modifications and reappoint to recheck in 2 weeks  X-rays indicate that there is spur formation with no indications of stress fracture arthritis

## 2018-11-08 ENCOUNTER — Telehealth: Payer: Self-pay | Admitting: Podiatry

## 2018-11-08 NOTE — Telephone Encounter (Signed)
Prior authorization was sent in for a [employer] but insurance already has a claim on file for this. They stated that we would need to wait on approval/denial of that claim and if it were denied we would need to dispute it with the claims department. If you have any questions give them a call, [phone].

## 2018-11-16 ENCOUNTER — Other Ambulatory Visit: Payer: Self-pay | Admitting: Family Medicine

## 2018-11-18 ENCOUNTER — Ambulatory Visit: Payer: PPO

## 2018-11-18 ENCOUNTER — Other Ambulatory Visit: Payer: Self-pay | Admitting: Cardiovascular Disease

## 2018-11-18 ENCOUNTER — Ambulatory Visit (INDEPENDENT_AMBULATORY_CARE_PROVIDER_SITE_OTHER): Payer: PPO | Admitting: Podiatry

## 2018-11-18 ENCOUNTER — Other Ambulatory Visit: Payer: Self-pay

## 2018-11-18 ENCOUNTER — Encounter: Payer: Self-pay | Admitting: Podiatry

## 2018-11-18 VITALS — Temp 97.8°F

## 2018-11-18 DIAGNOSIS — M722 Plantar fascial fibromatosis: Secondary | ICD-10-CM

## 2018-11-18 NOTE — Progress Notes (Signed)
Subjective:   Patient ID: [name], male   DOB: 58 y.o.   MRN: [id number]   HPI Patient states overall the heel doing pretty well with one spot that seems to be sore.  Patient states he thinks that he get a medication one more time but that will be the final thing that he needs   ROS      Objective:  Physical Exam  Fasciitis right improved with 1 spot that sore     Assessment:  Inflammatory plantar fasciitis right improved but one spot present     Plan:  H&P reviewed choices at this point sterile prep and injected the fascia 3 mg Kenalog 5 mg Xylocaine tolerated well and is discharged and will be seen back as needed

## 2018-11-19 ENCOUNTER — Other Ambulatory Visit: Payer: Self-pay | Admitting: Cardiovascular Disease

## 2018-11-19 NOTE — Telephone Encounter (Signed)
Outpatient Medication Detail   Disp Refills Start End   clopidogrel (PLAVIX) 75 MG tablet 30 tablet 0 11/19/2018    Sig - Route: Take 1 tablet (75 mg total) by mouth daily. Patient needs to call and schedule an appointment for further refills 1st attempt - Oral   Sent to pharmacy as: clopidogrel (PLAVIX) 75 MG tablet   E-Prescribing Status: Receipt confirmed by pharmacy (11/19/2018 10:00 AM EDT)   Pharmacy  [place], [place]. [address]

## 2018-12-16 ENCOUNTER — Telehealth: Payer: Self-pay | Admitting: Cardiovascular Disease

## 2018-12-16 DIAGNOSIS — I2510 Atherosclerotic heart disease of native coronary artery without angina pectoris: Secondary | ICD-10-CM

## 2018-12-16 DIAGNOSIS — E782 Mixed hyperlipidemia: Secondary | ICD-10-CM

## 2018-12-16 NOTE — Telephone Encounter (Signed)
Per pt call stated he would like to know if he should get lab work before 8/26 Virtual appt. And med ck ?

## 2018-12-16 NOTE — Telephone Encounter (Signed)
Called and left patient a detailed message that fasting lab orders have been placed and to call back to schedule lab appointment for Friday, Monday or Tuesday. Advised patient that if he has questions, to ask for me

## 2018-12-17 ENCOUNTER — Other Ambulatory Visit: Payer: Self-pay

## 2018-12-17 ENCOUNTER — Other Ambulatory Visit: Payer: PPO | Admitting: *Deleted

## 2018-12-17 ENCOUNTER — Other Ambulatory Visit: Payer: PPO

## 2018-12-17 DIAGNOSIS — I2510 Atherosclerotic heart disease of native coronary artery without angina pectoris: Secondary | ICD-10-CM

## 2018-12-17 DIAGNOSIS — E782 Mixed hyperlipidemia: Secondary | ICD-10-CM | POA: Diagnosis not present

## 2018-12-17 LAB — BASIC METABOLIC PANEL
BUN/Creatinine Ratio: 9 (ref 9–20)
BUN: 10 mg/dL (ref 6–24)
CO2: 22 mmol/L (ref 20–29)
Calcium: 9.3 mg/dL (ref 8.7–10.2)
Chloride: 105 mmol/L (ref 96–106)
Creatinine, Ser: 1.08 mg/dL (ref 0.76–1.27)
GFR calc Af Amer: 88 mL/min/{1.73_m2} (ref >59)
GFR calc non Af Amer: 76 mL/min/{1.73_m2} (ref >59)
Glucose: 121 mg/dL — ABNORMAL HIGH (ref 65–99)
Potassium: 4.2 mmol/L (ref 3.5–5.2)
Sodium: 142 mmol/L (ref 134–144)

## 2018-12-17 LAB — HEPATIC FUNCTION PANEL
ALT: 20 IU/L (ref 0–44)
AST: 21 IU/L (ref 0–40)
Albumin: 4.3 g/dL (ref 3.8–4.9)
Alkaline Phosphatase: 103 IU/L (ref 39–117)
Bilirubin Total: 1 mg/dL (ref 0.0–1.2)
Bilirubin, Direct: 0.28 mg/dL (ref 0.00–0.40)
Total Protein: 6.6 g/dL (ref 6.0–8.5)

## 2018-12-17 LAB — LIPID PANEL
Chol/HDL Ratio: 3 ratio (ref 0.0–5.0)
Cholesterol, Total: 108 mg/dL (ref 100–199)
HDL: 36 mg/dL — ABNORMAL LOW (ref >39)
LDL Calculated: 53 mg/dL (ref 0–99)
Triglycerides: 97 mg/dL (ref 0–149)
VLDL Cholesterol Cal: 19 mg/dL (ref 5–40)

## 2018-12-18 ENCOUNTER — Other Ambulatory Visit: Payer: Self-pay | Admitting: Cardiovascular Disease

## 2018-12-21 ENCOUNTER — Telehealth: Payer: Self-pay | Admitting: Cardiovascular Disease

## 2018-12-21 ENCOUNTER — Telehealth: Payer: Self-pay

## 2018-12-21 NOTE — Telephone Encounter (Signed)
New Message    Pt says he had 2 missed calls and is returning the call   Please call back

## 2018-12-21 NOTE — Telephone Encounter (Signed)
**Note Allen-Identified via Obfuscation** Virtual Visit Pre-Appointment Phone Call  "(Name), I am calling you today to discuss your upcoming appointment. We are currently trying to limit exposure to the virus that causes COVID-19 by seeing patients at home rather than in the office."  1. "What is the BEST phone number to call the day of the visit?" - include this in appointment notes  2. "Do you have or have access to (through a family member/friend) a smartphone with video capability that we can use for your visit?" a. If yes - list this number in appt notes as "cell" (if different from BEST phone #) and list the appointment type as a VIDEO visit in appointment notes b. If no - list the appointment type as a PHONE visit in appointment notes  3. Confirm consent - "In the setting of the current Covid19 crisis, you are scheduled for a (phone or video) visit with your provider on (date) at (time).  Just as we do with many in-office visits, in order for you to participate in this visit, we must obtain consent.  If you'd like, I can send this to your mychart (if signed up) or email for you to review.  Otherwise, I can obtain your verbal consent now.  All virtual visits are billed to your insurance company just like a normal visit would be.  By agreeing to a virtual visit, we'd like you to understand that the technology does not allow for your provider to perform an examination, and thus may limit your provider's ability to fully assess your condition. If your provider identifies any concerns that need to be evaluated in person, we will make arrangements to do so.  Finally, though the technology is pretty good, we cannot assure that it will always work on either your or our end, and in the setting of a video visit, we may have to convert it to a phone-only visit.  In either situation, we cannot ensure that we have a secure connection.  Are you willing to proceed?" STAFF: Did the patient verbally acknowledge consent to telehealth visit? Document  YES/NO here: YES  4. Advise patient to be prepared - "Two hours prior to your appointment, go ahead and check your blood pressure, pulse, oxygen saturation, and your weight (if you have the equipment to check those) and write them all down. When your visit starts, your provider will ask you for this information. If you have an Apple Watch or Kardia device, please plan to have heart rate information ready on the day of your appointment. Please have a pen and paper handy nearby the day of the visit as well."  5. Give patient instructions for MyChart download to smartphone OR Doximity/Doxy.me as below if video visit (depending on what platform provider is using)  6. Inform patient they will receive a phone call 15 minutes prior to their appointment time (may be from unknown caller ID) so they should be prepared to answer    TELEPHONE CALL NOTE  [name] has been deemed a candidate for a follow-up tele-health visit to limit community exposure during the Covid-19 pandemic. I spoke with the patient via phone to ensure availability of phone/video source, confirm preferred email & phone number, and discuss instructions and expectations.  I reminded [name] to be prepared with any vital sign and/or heart rhythm information that could potentially be obtained via home monitoring, at the time of his visit. I reminded [name] to expect a phone call prior to  his visit.  [name], [facility] 12/21/2018 11:00 AM   INSTRUCTIONS FOR DOWNLOADING THE MYCHART APP TO SMARTPHONE  - The patient must first make sure to have activated MyChart and know their login information - If Apple, go to [employer] and type in MyChart in the search bar and download the app. If Android, ask patient to go to [employer] and type in [place] in the search bar and download the app. The app is free but as with any other app downloads, their phone may require them to verify saved payment information or  Apple/Android password.  - The patient will need to then log into the app with their MyChart username and password, and select [facility] as their healthcare provider to link the account. When it is time for your visit, go to the MyChart app, find appointments, and click Begin Video Visit. Be sure to Select Allow for your device to access the Microphone and Camera for your visit. You will then be connected, and your provider will be with you shortly.  **If they have any issues connecting, or need assistance please contact MyChart service desk [phone])**  **If using a computer, in order to ensure the best quality for their visit they will need to use either of the following Internet Browsers: [employer], or Google Chrome**  IF USING DOXIMITY or DOXY.ME - The patient will receive a link just prior to their visit by text.     FULL LENGTH CONSENT FOR TELE-HEALTH VISIT   I hereby voluntarily request, consent and authorize [facility] and its employed or contracted physicians, physician assistants, nurse practitioners or other licensed health care professionals (the Practitioner), to provide me with telemedicine health care services (the "Services") as deemed necessary by the treating Practitioner. I acknowledge and consent to receive the Services by the Practitioner via telemedicine. I understand that the telemedicine visit will involve communicating with the Practitioner through live audiovisual communication technology and the disclosure of certain medical information by electronic transmission. I acknowledge that I have been given the opportunity to request an in-person assessment or other available alternative prior to the telemedicine visit and am voluntarily participating in the telemedicine visit.  I understand that I have the right to withhold or withdraw my consent to the use of telemedicine in the course of my care at any time, without affecting my right to future care  or treatment, and that the Practitioner or I may terminate the telemedicine visit at any time. I understand that I have the right to inspect all information obtained and/or recorded in the course of the telemedicine visit and may receive copies of available information for a reasonable fee.  I understand that some of the potential risks of receiving the Services via telemedicine include:  Marland Kitchen Delay or interruption in medical evaluation due to technological equipment failure or disruption; . Information transmitted may not be sufficient (e.g. poor resolution of images) to allow for appropriate medical decision making by the Practitioner; and/or  . In rare instances, security protocols could fail, causing a breach of personal health information.  Furthermore, I acknowledge that it is my responsibility to provide information about my medical history, conditions and care that is complete and accurate to the best of my ability. I acknowledge that Practitioner's advice, recommendations, and/or decision may be based on factors not within their control, such as incomplete or inaccurate data provided by me or distortions of diagnostic images or specimens that may result from electronic transmissions.  I understand that the practice of medicine is not an exact science and that Practitioner makes no warranties or guarantees regarding treatment outcomes. I acknowledge that I will receive a copy of this consent concurrently upon execution via email to the email address I last provided but may also request a printed copy by calling the office of [facility].    I understand that my insurance will be billed for this visit.   I have read or had this consent read to me. . I understand the contents of this consent, which adequately explains the benefits and risks of the Services being provided via telemedicine.  . I have been provided ample opportunity to ask questions regarding this consent and the Services and have had  my questions answered to my satisfaction. . I give my informed consent for the services to be provided through the use of telemedicine in my medical care  By participating in this telemedicine visit I agree to the above.

## 2018-12-21 NOTE — Telephone Encounter (Signed)
The patient has been notified of the result and verbalized understanding.  All questions (if any) were answered. Frederik Schmidt, RN 12/21/2018 10:40 AM

## 2018-12-22 ENCOUNTER — Telehealth (INDEPENDENT_AMBULATORY_CARE_PROVIDER_SITE_OTHER): Payer: PPO | Admitting: Cardiovascular Disease

## 2018-12-22 ENCOUNTER — Other Ambulatory Visit: Payer: Self-pay | Admitting: Pharmacist Clinician (PhC)/ Clinical Pharmacy Specialist

## 2018-12-22 ENCOUNTER — Other Ambulatory Visit: Payer: Self-pay

## 2018-12-22 ENCOUNTER — Telehealth: Payer: Self-pay | Admitting: Cardiovascular Disease

## 2018-12-22 VITALS — BP 164/101 | HR 59 | Wt 265.0 lb

## 2018-12-22 DIAGNOSIS — Z7189 Other specified counseling: Secondary | ICD-10-CM

## 2018-12-22 DIAGNOSIS — I2510 Atherosclerotic heart disease of native coronary artery without angina pectoris: Secondary | ICD-10-CM

## 2018-12-22 DIAGNOSIS — I10 Essential (primary) hypertension: Secondary | ICD-10-CM | POA: Diagnosis not present

## 2018-12-22 DIAGNOSIS — E782 Mixed hyperlipidemia: Secondary | ICD-10-CM | POA: Diagnosis not present

## 2018-12-22 MED ORDER — FUROSEMIDE 20 MG PO TABS: ORAL_TABLET | ORAL | 3 refills | Status: DC

## 2018-12-22 MED ORDER — HYDROCHLOROTHIAZIDE 25 MG PO TABS: 25.0000 mg | ORAL_TABLET | Freq: Every day | ORAL | 3 refills | Status: DC

## 2018-12-22 MED ORDER — PRALUENT 75 MG/ML ~~LOC~~ SOAJ: 75.0000 mg | SUBCUTANEOUS | 3 refills | Status: DC

## 2018-12-22 NOTE — Progress Notes (Signed)
Virtual Visit via Telephone Note   This visit type was conducted due to national recommendations for restrictions regarding the COVID-19 Pandemic (e.g. social distancing) in an effort to limit this patient's exposure and mitigate transmission in our community.  Due to his co-morbid illnesses, this patient is at least at moderate risk for complications without adequate follow up.  This format is felt to be most appropriate for this patient at this time.  The patient did not have access to video technology/had technical difficulties with video requiring transitioning to audio format only (telephone).  All issues noted in this document were discussed and addressed.  No physical exam could be performed with this format.  Please refer to the patient's chart for his  consent to telehealth for [facility].   Date:  12/22/2018   ID:  [name], DOB 1960/05/04, MRN [id number]  Patient Location: Home Provider Location: Home  PCP:  Allen Frizzle, MD  Cardiologist:  Allen Moores, MD  Electrophysiologist:  None   Problem List 1. Coronary artery disease: He status post CABG in October , 2010. He status post inferior wall myocardial infarction in January, 2011 with multiple stents to the native right coronary artery [name] 2. Peripheral vascular disease-status post iliac stenting 3. Dyslipidemia 4.. Hypertension  Notes prior to 2014:   [name] is a 58 yo with CAD and PVD. He has had problems with his left foot and has not been able to exercise. He continues to have lots of sternal wound pain whenever he moves his arm or lifts anything heavy. 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   [name] has had some muscle aches which resolved when he stopped his Crestor. He has hx of severe CAD and PVD. He has had a nagging left ankle injury that keeps him from walking much.  May 12 ,2014: He continues to have  occasional CP and frequent episodes of dyspnea. He has been told that he has COPD. He is still in a left leg splint.  August 24, 2013:  [name] was admitted to the hospital in January with chest discomfort. A Lexiscan Myoview study was negative for ischemia. His left systolic function is normal. He's feeling a bit better. He has been in a boot for a chronic left ankle injury for the past several years.    07/07/2014   [name] is a 58 y.o. male who presents for HTN Has been sticking to his diet.  Walks every day.  Has lost 8-9 lbs over the past several weeks.  He is very lightheaded when he first gets up in the AM.  Has been getting worse over the past month or so.  Also has frequent episodes of orthostatic hypotension.  BP has been low on occasion. Today's BP is normal  - he is off his BP meds.  Avoids salty foods.  No CP or dyspnea. Was told to stay off his BP meds until he saw me. He is feeling better off the BP meds    Nov. 21, 2016:  Doing well.   BP was normal when he left his house this am  A bit higher here.  Exercising a bit.  Still having left leg issus.    Sep 20, 2015:  Is having some episodes of chest tightness Occurs anytime - rest or activity . Last 30 seconds.  Has not tried an NTG.  Has more foot  problems ,  Not able to exercise much .  Has lots of heartburn  Issues.   Seems different than angina   Sep 07, 2017  [name] is seen for follow up of his OSA  Was last seen by [name] in February, 2019.  He is not had any episodes of chest pain or shortness of breath.  He is been diagnosed with obstructive sleep apnea.  He has a CPAP mask but is not tolerating it all that well.  He typically can tolerate it for about 4 hours at night.  Limited by an ankle injury,   [name] 4 years ago , had a compund fracture of left ankle Sees th eorthopedist at [facility]    BP at home is normal .   Has significant DOE.  No angina    Evaluation Performed:   Follow-Up Visit  Chief Complaint:  CAD   Aug. 26, 2020    [name] is a 58 y.o. male with  CAD , HTN, dyslipidemia   No CP ,  Some dyspnea Getting some exercise Still having feet issues BP is elevated.  Has been dealing with his father who has cancer . BP is elevated.       The patient does not have symptoms concerning for COVID-19 infection (fever, chills, cough, or new shortness of breath).    Past Medical History:  Diagnosis Date  . Acute MI, inferior wall ([place]) 04/2009  . Chest pain   . COPD (chronic obstructive pulmonary disease) ([place])   . Coronary artery disease    STATUS POST CABG 01/2009  . Dyslipidemia   . Hyperlipidemia   . Hypertension   . Orthostatic hypotension   . Peripheral vascular disease ([place])   . Syncope and collapse    Past Surgical History:  Procedure Laterality Date  . ANKLE SURGERY     x 2  . COLONOSCOPY  April 2013  . CORONARY ANGIOPLASTY    . CORONARY ARTERY BYPASS GRAFT  2010  . FOOT SURGERY    . HERNIA REPAIR  01/23/12   umb hernia  . Kidney stimulator    . LEFT HEART CATH AND CORS/GRAFTS ANGIOGRAPHY N/A 06/02/2016   Procedure: Left Heart Cath and Cors/Grafts Angiography;  Surgeon: Lorretta Harp, MD;  Location: [facility];  Service: Cardiovascular;  Laterality: N/A;  . LEFT HEART CATHETERIZATION WITH CORONARY/GRAFT ANGIOGRAM N/A 07/08/2012   Procedure: LEFT HEART CATHETERIZATION WITH Beatrix Fetters;  Surgeon: Thayer Headings, MD;  Location[facility] C[facility];  Service: Cardiovascular;  Laterality: N/A;  . PR VEIN BYPASS GRAFT,AORTO-FEM-POP    . STERNAL WIRES REMOVAL  07/08/2011   Procedure: STERNAL WIRES REMOVAL;  Surgeon: Grace Isaac, MD;  Location: [facility];  Service: Open Heart Surgery;  Laterality: N/A;  removal of 4th wire     No outpatient medications have been marked as taking for the 12/22/18 encounter (Appointment) with [name], Wonda Cheng, MD.     Allergies:   Crestor [rosuvastatin]   Social History    Tobacco Use  . Smoking status: Former Smoker    Packs/day: 3.00    Years: 30.00    Pack years: 90.00    Types: Cigarettes, Cigars    Quit date: 01/26/2009    Years since quitting: 9.9  . Smokeless tobacco: Never Used  Substance Use Topics  . Alcohol use: No  . Drug use: No     Family Hx: The patient's family history includes Cancer in his mother; Coronary artery disease (  age of onset: 48) in his father; Diabetes in his father and mother; Heart disease in his father; Hyperlipidemia in his father and mother; Hypertension in his brother, father, and mother.  ROS:   Please see the history of present illness.     All other systems reviewed and are negative.   Prior CV studies:   The following studies were reviewed today:    Labs/Other Tests and Data Reviewed:    EKG:  No ECG reviewed.  Recent Labs: 02/15/2018: Hemoglobin 14.3; Platelets 188 12/17/2018: ALT 20; BUN 10; Creatinine, Ser 1.08; Potassium 4.2; Sodium 142   Recent Lipid Panel Lab Results  Component Value Date/Time   CHOL 108 12/17/2018 07:46 AM   TRIG 97 12/17/2018 07:46 AM   HDL 36 (L) 12/17/2018 07:46 AM   CHOLHDL 3.0 12/17/2018 07:46 AM   CHOLHDL 2.1 02/15/2018 08:19 AM   LDLCALC 53 12/17/2018 07:46 AM   LDLCALC 24 02/15/2018 08:19 AM   LDLDIRECT 117 (H) 02/23/2017 03:49 PM    Wt Readings from Last 3 Encounters:  10/06/18 250 lb (113.4 kg)  02/12/18 252 lb (114.3 kg)  09/07/17 263 lb (119.3 kg)     Objective:    Vital Signs:  There were no vitals taken for this visit.     ASSESSMENT & PLAN:    1. 1.  Coronary artery disease: Raahim is doing fairly well from a coronary standpoint.  He is not had any episodes of chest pain or shortness of breath.  His cholesterol levels look great on the Praluent.  His LDL is 53.  He has stopped smoking.  He still has elevated blood pressure which we are continuing to work on.  2.  Hyperlipidemia: Continue Praluent.  We will refill that medication today.  3.   Essential hypertension: Blood pressure is very elevated today.  He has been dealing with his father who has recent cancer diagnosis.  He admits to eating a whole lot more salt than he should.  He also admits that he is not exercising.  I encouraged him to exercise on a regular basis.  I encouraged him to make better choices when he is eating fast food.  He is on the road quite a bit traveling to and from his father so he is eating choices have been very poor.  COVID-19 Education: The signs and symptoms of COVID-19 were discussed with the patient and how to seek care for testing (follow up with PCP or arrange E-visit).  The importance of social distancing was discussed today.  Time:   Today, I have spent  18 minutes with the patient with telehealth technology discussing the above problems.     Medication Adjustments/Labs and Tests Ordered: Current medicines are reviewed at length with the patient today.  Concerns regarding medicines are outlined above.   Tests Ordered: No orders of the defined types were placed in this encounter.   Medication Changes: No orders of the defined types were placed in this encounter.   Follow Up:  In Person in 3 month(s) with APP . BMP in 2-3 weeks ( started daily HCTZ)   Signed, Allen Moores, MD  12/22/2018 8:41 AM    [place]

## 2018-12-22 NOTE — Patient Instructions (Addendum)
Medication Instructions:  Your physician has recommended you make the following change in your medication:  1.) change furosemide (Lasix) to 20 mg three times a week as needed 2.) start hydrochlorothiazide (hctz) 25 mg once a day  If you need a refill on your cardiac medications before your next appointment, please call your pharmacy.   Lab work: In about 2-3 weeks --BMET --appointment made for 01/11/19 - come anytime between 7:30 am -4:30 pm   If you have labs (blood work) drawn today and your tests are completely normal, you will receive your results only by: Marland Kitchen MyChart Message (if you have MyChart) OR . A paper copy in the mail If you have any lab test that is abnormal or we need to change your treatment, we will call you to review the results.  Testing/Procedures: none  Follow-Up: You have been scheduled for follow up with Richardson Dopp, PA-C on February 15, 2019 at 8:45 am.  Any Other Special Instructions Will Be Listed Below (If Applicable).

## 2018-12-22 NOTE — Addendum Note (Signed)
Addended by: Rodman Key on: 12/22/2018 10:17 AM   Modules accepted: Orders

## 2018-12-22 NOTE — Telephone Encounter (Signed)
Pt was calling to get instructions from today's televisit with Dr. Acie Fredrickson.  Pt states he was unable to wait for the covering RN working with Dr. Acie Fredrickson this morning, for he had to attend a Dentist appt.  Went over the pts AVS from his televisit this morning with him over the phone.  Informed him of his new med changes, upcoming lab appt, and the date and time of his next follow-up appt.  Also endorsed to the pt that [place] working with Dr. Acie Fredrickson this morning, went ahead and mailed his AVS to his mailing address today.  Informed the pt that his AVS will have everything discussed from today's visit, and will endorse his new med changes and upcoming follow-up appts.  Pt verbalized understanding and agrees with this plan.  Pt was more than gracious for all the assistance provided.

## 2018-12-22 NOTE — Telephone Encounter (Signed)
New Message    Patient states he was waiting for a call about changing his medication, and has a missed call but didn't have a message so he's not sure who called him.

## 2018-12-28 DIAGNOSIS — Z79899 Other long term (current) drug therapy: Secondary | ICD-10-CM | POA: Diagnosis not present

## 2018-12-28 DIAGNOSIS — Z5181 Encounter for therapeutic drug level monitoring: Secondary | ICD-10-CM | POA: Diagnosis not present

## 2018-12-28 DIAGNOSIS — G8921 Chronic pain due to trauma: Secondary | ICD-10-CM | POA: Diagnosis not present

## 2019-01-06 ENCOUNTER — Telehealth: Payer: Self-pay | Admitting: Cardiovascular Disease

## 2019-01-06 NOTE — Telephone Encounter (Signed)
Spoke with patient and advised that bmet is needed on 9/15 for recent change in medications on 8/26. Patient verbalized understanding and agreement with plan and thanked me for the call.

## 2019-01-06 NOTE — Telephone Encounter (Signed)
Per pt call stated he did lab work already and wants do no wethere or not he has to do this BMET  9/15?  Please give him a call back.

## 2019-01-11 ENCOUNTER — Other Ambulatory Visit: Payer: PPO

## 2019-01-11 ENCOUNTER — Other Ambulatory Visit: Payer: Self-pay

## 2019-01-11 DIAGNOSIS — I10 Essential (primary) hypertension: Secondary | ICD-10-CM | POA: Diagnosis not present

## 2019-01-11 LAB — BASIC METABOLIC PANEL
BUN/Creatinine Ratio: 14 (ref 9–20)
BUN: 17 mg/dL (ref 6–24)
CO2: 23 mmol/L (ref 20–29)
Calcium: 9 mg/dL (ref 8.7–10.2)
Chloride: 103 mmol/L (ref 96–106)
Creatinine, Ser: 1.19 mg/dL (ref 0.76–1.27)
GFR calc Af Amer: 78 mL/min/{1.73_m2} (ref >59)
GFR calc non Af Amer: 68 mL/min/{1.73_m2} (ref >59)
Glucose: 144 mg/dL — ABNORMAL HIGH (ref 65–99)
Potassium: 3.7 mmol/L (ref 3.5–5.2)
Sodium: 141 mmol/L (ref 134–144)

## 2019-01-26 ENCOUNTER — Other Ambulatory Visit: Payer: Self-pay

## 2019-01-26 ENCOUNTER — Ambulatory Visit (INDEPENDENT_AMBULATORY_CARE_PROVIDER_SITE_OTHER): Payer: PPO

## 2019-01-26 DIAGNOSIS — Z23 Encounter for immunization: Secondary | ICD-10-CM | POA: Diagnosis not present

## 2019-02-07 ENCOUNTER — Other Ambulatory Visit: Payer: Self-pay | Admitting: Cardiovascular Disease

## 2019-02-07 ENCOUNTER — Other Ambulatory Visit: Payer: Self-pay | Admitting: Family Medicine

## 2019-02-07 DIAGNOSIS — I10 Essential (primary) hypertension: Secondary | ICD-10-CM

## 2019-02-14 NOTE — Progress Notes (Signed)
Cardiology Office Note:    Date:  02/15/2019   ID:  [name], DOB 04/25/1961, MRN [id number]  PCP:  Allen Frizzle, MD  Cardiologist:  Allen Moores, MD  Electrophysiologist:  None   Referring MD: Allen Frizzle, MD   Chief Complaint  Patient presents with  . Follow-up    CAD, HTN    History of Present Illness:    [name] is a 58 y.o. male with:   Coronary artery disease   s/p CABG in 2010  s/p Inf MI in 04/2009 >>PCI:  DES to RCA  PAD   s/p Iliac stenting  Hypertension   Hyperlipidemia   OSA  [name] was last seen by Dr. Acie [name] via Telemedicine.  His BP was elevated and he was placed on HCTZ.   He returns for follow-up.  He is here alone.  Since last seen, he has not had chest discomfort, syncope.  He does note dyspnea with some activities.  This is a chronic symptom without change.  He sleeps on 3 pillows chronically.  He has not had paroxysmal nocturnal dyspnea.  He notes bilateral leg pain with exertion.  This has been ongoing for the past year.  He had a trial off of statin therapy without significant relief.  Prior CV studies:   The following studies were reviewed today:  Echocardiogram 09/14/17 Mild LVH, EF 55-60, Gr 1 DD, MAC, mild LAE  ABIs 07/01/2017 +-------+-----------+-----------+------------+------------+ ABI/TBIToday's ABIToday's TBIPrevious ABIPrevious TBI +-------+-----------+-----------+------------+------------+ Right  1.16       0.86       1.18        1.08         +-------+-----------+-----------+------------+------------+ Left   1.05       0.78       1.15        0.99         +-------+-----------+-----------+------------+------------+   Final Interpretation: Right: Resting right ankle-brachial index is within normal range. No evidence of significant right lower extremity arterial disease. The right toe-brachial index is normal. RT great toe pressure = 142 mmHg. Left: Resting left ankle-brachial index is  within normal range. No evidence of significant left lower extremity arterial disease. The left toe-brachial index is normal. LT Great toe pressure = 129 mmHg.  Cardiac catheterization 06/02/16 LAD mid 55 RI ost 100 RCA prox stent patent; RPDA ost 50 L-LAD patent S-RI patent S-OM3 patent  S-RCA 100 LVSF normal   Carotid US 05/23/13 - Bilateral - 1% to39% ICA stenosis. Vertebral artery flow  is antefgrade.   Myoview 06/22/2012 Low risk stress nuclear study with a small, moderate intensity, reversible inferobasal defect consistent with very mild inferior ischemia.   LV Ejection Fraction: 50%.    Past Medical History:  Diagnosis Date  . Acute MI, inferior wall ([name]) 04/2009  . Chest pain   . COPD (chronic obstructive pulmonary disease) ([place])   . Coronary artery disease    STATUS POST CABG 01/2009  . Dyslipidemia   . Hyperlipidemia   . Hypertension   . Orthostatic hypotension   . Peripheral vascular disease ([place])   . Syncope and collapse    Surgical Hx: The patient  has a past surgical history that includes Ankle surgery; Sternal wires removal (07/08/2011); pr vein bypass graft,aorto-fem-pop; Coronary artery bypass graft (2010); Colonoscopy (April 2013); Foot surgery; Kidney stimulator; Hernia repair (01/23/12); left heart catheterization with coronary/graft angiogram (N/A, 07/08/2012); Coronary angioplasty; and LEFT HEART CATH AND CORS/GRAFTS ANGIOGRAPHY (N/A, 06/02/2016).  Current Medications: Current Meds  Medication Sig  . albuterol (VENTOLIN HFA) 108 (90 Base) MCG/ACT inhaler INHALE 2 PUFFS INTO THE LUNGS EVERY 4 HOURS AS NEEDED FOR WHEEZING OR SHORTNESS OF BREATH  . Alirocumab (PRALUENT) 75 MG/ML SOAJ Inject 75 mg into the skin every 14 (fourteen) days.  Marland Kitchen amLODipine (NORVASC) 10 MG tablet TAKE 1 TABLET(10 MG) BY MOUTH DAILY  . aspirin EC 81 MG tablet Take 1 tablet (81 mg total) by mouth every morning.  Marland Kitchen atorvastatin (LIPITOR) 80 MG tablet TAKE 1 TABLET BY MOUTH EVERY NIGHT  AT BEDTIME  . cloNIDine (CATAPRES) 0.1 MG tablet Take 1 to 2 tabs daily as needed for anxiety for up to 10 days.  . clopidogrel (PLAVIX) 75 MG tablet TAKE 1 TABLET(75 MG) BY MOUTH DAILY  . furosemide (LASIX) 20 MG tablet Take one tablet by mouth 3 times a week as needed  . gabapentin (NEURONTIN) 400 MG capsule Take 2 capsules (800 mg total) by mouth 2 (two) times daily.  . hydrochlorothiazide (HYDRODIURIL) 25 MG tablet Take 1 tablet (25 mg total) by mouth daily.  Marland Kitchen losartan (COZAAR) 100 MG tablet TAKE 1 TABLET(100 MG) BY MOUTH DAILY     Allergies:   Crestor [rosuvastatin]   Social History   Tobacco Use  . Smoking status: Former Smoker    Packs/day: 3.00    Years: 30.00    Pack years: 90.00    Types: Cigarettes, Cigars    Quit date: 01/26/2009    Years since quitting: 10.0  . Smokeless tobacco: Never Used  Substance Use Topics  . Alcohol use: No  . Drug use: No     Family Hx: The patient's family history includes Cancer in his mother; Coronary artery disease (age of onset: 18) in his father; Diabetes in his father and mother; Heart disease in his father; Hyperlipidemia in his father and mother; Hypertension in his brother, father, and mother.  ROS:   Please see the history of present illness.    Review of Systems  Gastrointestinal: Negative for hematochezia and melena.  Genitourinary: Negative for hematuria.   All other systems reviewed and are negative.   EKGs/Labs/Other Test Reviewed:    EKG:  EKG is ordered today.  The ekg ordered today demonstrates normal sinus rhythm, heart rate 63, inferior Q waves, QTC 419, no change from prior tracing  Recent Labs: 12/17/2018: ALT 20 01/11/2019: BUN 17; Creatinine, Ser 1.19; Potassium 3.7; Sodium 141   Recent Lipid Panel Lab Results  Component Value Date/Time   CHOL 108 12/17/2018 07:46 AM   TRIG 97 12/17/2018 07:46 AM   HDL 36 (L) 12/17/2018 07:46 AM   CHOLHDL 3.0 12/17/2018 07:46 AM   CHOLHDL 2.1 02/15/2018 08:19 AM    LDLCALC 53 12/17/2018 07:46 AM   LDLCALC 24 02/15/2018 08:19 AM   LDLDIRECT 117 (H) 02/23/2017 03:49 PM    Physical Exam:    VS:  BP 116/82   Pulse 67   Ht 6' (1.829 m)   Wt 259 lb 12.8 oz (117.8 kg)   SpO2 97%   BMI 35.24 kg/m     Wt Readings from Last 3 Encounters:  02/15/19 259 lb 12.8 oz (117.8 kg)  12/22/18 265 lb (120.2 kg)  10/06/18 250 lb (113.4 kg)     Physical Exam  Constitutional: He is oriented to person, place, and time. He appears well-developed and well-nourished. No distress.  HENT:  Head: Normocephalic and atraumatic.  Eyes: No scleral icterus.  Neck: No JVD present. No  thyromegaly present.  Cardiovascular: Normal rate, regular rhythm and normal heart sounds.  No murmur heard. Pulmonary/Chest: Effort normal and breath sounds normal. He has no rales.  Abdominal: Soft. There is no hepatomegaly.  Musculoskeletal:        General: No edema.  Lymphadenopathy:    He has no cervical adenopathy.  Neurological: He is alert and oriented to person, place, and time.  Skin: Skin is warm and dry.  Psychiatric: He has a normal mood and affect.    ASSESSMENT & PLAN:    1. Coronary artery disease involving native coronary artery of native heart without angina pectoris History of CABG in 2010 and subsequent myocardial infarction in 2011 treated with DES to the RCA.  Cardiac catheterization November 2018 demonstrated patent LIMA-LAD, SVG-OM 3 and SVG-RI.  SVG-RCA was occluded but the RCA stent was patent.  He is doing well without anginal symptoms.  Continue aspirin, clopidogrel, atorvastatin, Alirocumab.  2. Essential hypertension The patient's blood pressure is controlled on his current regimen.  Continue current therapy.  I have asked him to continue to monitor his blood pressure at home.  3. Mixed hyperlipidemia LDL optimal on most recent lab work.  Continue current Rx.    4. Peripheral vascular disease ([place]) He notes bilateral leg pain with exertion that resolves  with rest.  This is more anterior leg pain and not calf or thigh pain.  But, it has been more than a year since his ABIs were performed.  Obtain follow-up ABIs/arterial Dopplers.  If these are normal, suggest follow-up with primary care for further evaluation of leg pain.   Dispo:  Return in about 6 months (around 08/16/2019) for Routine Follow Up, w/ Dr. Acie [name], or Richardson Dopp, PA-C, (virtual or in-person).   Medication Adjustments/Labs and Tests Ordered: Current medicines are reviewed at length with the patient today.  Concerns regarding medicines are outlined above.  Tests Ordered: Orders Placed This Encounter  Procedures  . EKG 12-Lead  . VAS [place] LOWER EXTREMITY ARTERIAL DUPLEX   Medication Changes: No orders of the defined types were placed in this encounter.   Signed, Richardson Dopp, PA-C  02/15/2019 11:02 AM    [facility] [address], [place], [place]  [address] Phone: [phone]; Fax: [phone]

## 2019-02-15 ENCOUNTER — Other Ambulatory Visit: Payer: Self-pay

## 2019-02-15 ENCOUNTER — Encounter: Payer: Self-pay | Admitting: Physician Assistant

## 2019-02-15 ENCOUNTER — Ambulatory Visit (INDEPENDENT_AMBULATORY_CARE_PROVIDER_SITE_OTHER): Payer: PPO | Admitting: Physician Assistant

## 2019-02-15 VITALS — BP 116/82 | HR 67 | Ht 72.0 in | Wt 259.8 lb

## 2019-02-15 DIAGNOSIS — I739 Peripheral vascular disease, unspecified: Secondary | ICD-10-CM

## 2019-02-15 DIAGNOSIS — I10 Essential (primary) hypertension: Secondary | ICD-10-CM

## 2019-02-15 DIAGNOSIS — I2510 Atherosclerotic heart disease of native coronary artery without angina pectoris: Secondary | ICD-10-CM | POA: Diagnosis not present

## 2019-02-15 DIAGNOSIS — E782 Mixed hyperlipidemia: Secondary | ICD-10-CM | POA: Diagnosis not present

## 2019-02-15 NOTE — Patient Instructions (Addendum)
Medication Instructions:  Your physician recommends that you continue on your current medications as directed. Please refer to the Current Medication list given to you today.  *If you need a refill on your cardiac medications before your next appointment, please call your pharmacy*  Lab Work: None ordered  If you have labs (blood work) drawn today and your tests are completely normal, you will receive your results only by: Marland Kitchen MyChart Message (if you have MyChart) OR . A paper copy in the mail If you have any lab test that is abnormal or we need to change your treatment, we will call you to review the results.  Testing/Procedures: Your physician has requested that you have a lower extremity arterial exercise duplex. During this test, exercise and ultrasound are used to evaluate arterial blood flow in the legs. Allow one hour for this exam. There are no restrictions or special instructions.   Follow-Up: At [facility], you and your health needs are our priority.  As part of our continuing mission to provide you with exceptional heart care, we have created designated Provider Care Teams.  These Care Teams include your primary Cardiologist (physician) and Advanced Practice Providers (APPs -  Physician Assistants and Nurse Practitioners) who all work together to provide you with the care you need, when you need it.  Your next appointment:   6 months  The format for your next appointment:   Either In Person or Virtual  Provider:   You may see Mertie Moores, MD or one of the following Advanced Practice Providers on your designated Care Team:    Richardson Dopp, PA-C  [place], [place]  Daune Perch, NP   Other Instructions

## 2019-02-22 DIAGNOSIS — M79672 Pain in left foot: Secondary | ICD-10-CM | POA: Diagnosis not present

## 2019-02-22 DIAGNOSIS — G894 Chronic pain syndrome: Secondary | ICD-10-CM | POA: Diagnosis not present

## 2019-02-22 DIAGNOSIS — M25572 Pain in left ankle and joints of left foot: Secondary | ICD-10-CM | POA: Diagnosis not present

## 2019-02-22 DIAGNOSIS — Z5181 Encounter for therapeutic drug level monitoring: Secondary | ICD-10-CM | POA: Diagnosis not present

## 2019-02-22 DIAGNOSIS — Z79899 Other long term (current) drug therapy: Secondary | ICD-10-CM | POA: Diagnosis not present

## 2019-02-22 DIAGNOSIS — G8921 Chronic pain due to trauma: Secondary | ICD-10-CM | POA: Diagnosis not present

## 2019-02-24 ENCOUNTER — Other Ambulatory Visit: Payer: Self-pay

## 2019-02-24 ENCOUNTER — Other Ambulatory Visit: Payer: Self-pay | Admitting: Physician Assistant

## 2019-02-24 ENCOUNTER — Ambulatory Visit (HOSPITAL_COMMUNITY)
Admission: RE | Admit: 2019-02-24 | Discharge: 2019-02-24 | Disposition: A | Discharge status: Home / Self Care | Payer: PPO | Source: Ambulatory Visit | Attending: Cardiology | Admitting: Cardiology

## 2019-02-24 DIAGNOSIS — I739 Peripheral vascular disease, unspecified: Secondary | ICD-10-CM

## 2019-03-09 ENCOUNTER — Other Ambulatory Visit: Payer: Self-pay | Admitting: Cardiovascular Disease

## 2019-04-23 ENCOUNTER — Other Ambulatory Visit: Payer: Self-pay | Admitting: Family Medicine

## 2019-05-18 DIAGNOSIS — G8921 Chronic pain due to trauma: Secondary | ICD-10-CM | POA: Diagnosis not present

## 2019-05-18 DIAGNOSIS — M25572 Pain in left ankle and joints of left foot: Secondary | ICD-10-CM | POA: Diagnosis not present

## 2019-05-18 DIAGNOSIS — M79672 Pain in left foot: Secondary | ICD-10-CM | POA: Diagnosis not present

## 2019-05-18 DIAGNOSIS — G894 Chronic pain syndrome: Secondary | ICD-10-CM | POA: Diagnosis not present

## 2019-05-22 ENCOUNTER — Other Ambulatory Visit: Payer: Self-pay | Admitting: Family Medicine

## 2019-05-23 ENCOUNTER — Other Ambulatory Visit: Payer: Self-pay | Admitting: Family Medicine

## 2019-05-25 DIAGNOSIS — J449 Chronic obstructive pulmonary disease, unspecified: Secondary | ICD-10-CM | POA: Diagnosis not present

## 2019-05-25 DIAGNOSIS — K219 Gastro-esophageal reflux disease without esophagitis: Secondary | ICD-10-CM | POA: Diagnosis not present

## 2019-05-25 DIAGNOSIS — Z86010 Personal history of colonic polyps: Secondary | ICD-10-CM | POA: Diagnosis not present

## 2019-05-26 ENCOUNTER — Telehealth: Payer: Self-pay | Admitting: *Deleted

## 2019-05-26 NOTE — Telephone Encounter (Signed)
   [facility] Pre-operative Risk Assessment    Request for surgical clearance:  1. What type of surgery is being performed? COLONOSCOPY   2. When is this surgery scheduled? 06/09/19   3. What type of clearance is required (medical clearance vs. Pharmacy clearance to hold med vs. Both)? MEDICAL  4. Are there any medications that need to be held prior to surgery and how long? PLAVIX AND ASA  5. Practice name and name of physician performing surgery?  [facility]; DR. PATRICK HUNG   6. What is your office phone number [phone]   7.   What is your office fax number [phone]  8.   Anesthesia type (None, local, MAC, general) ? PROPOFOL   Julaine Hua 05/26/2019, 1:28 PM  _________________________________________________________________   (provider comments below)

## 2019-05-26 NOTE — Telephone Encounter (Signed)
Patient may hold ASA and Plavix for 5-7 days prior to colonoscopy . He is at low risk for the procedure.    Mertie Moores, MD  05/26/2019 9:26 PM    [facility],  [address], [place]  [address] Phone: [phone]; Fax: [phone]

## 2019-05-26 NOTE — Telephone Encounter (Signed)
   Primary Cardiologist: Mertie Moores, MD  Chart reviewed as part of pre-operative protocol coverage. Patient was contacted 05/26/2019 in reference to pre-operative risk assessment for pending surgery as outlined below.  [name] was last seen on 02/15/19 by Richardson Dopp, PA.  Since that day, [name] has done well. Getting > 4 mets of activity.   Therefore, based on ACC/AHA guidelines, the patient would be at acceptable risk for the planned procedure without further cardiovascular testing.   Hx of CABG in 2010 and subsequent myocardial infarction in 2011 treated with DES to the RCA.  Cardiac catheterization November 2018 demonstrated patent LIMA-LAD, SVG-OM. He is maintained on DAPT.   Dr. Cathie Olden, please give your recommendations for aspirin and Plavix. Please forward your response to [facility] PREOP.   Thank you   Leanor Kail, PA 05/26/2019, 2:27 PM

## 2019-05-27 ENCOUNTER — Other Ambulatory Visit: Payer: Self-pay | Admitting: *Deleted

## 2019-05-27 ENCOUNTER — Other Ambulatory Visit: Payer: Self-pay | Admitting: Family Medicine

## 2019-05-27 DIAGNOSIS — I10 Essential (primary) hypertension: Secondary | ICD-10-CM

## 2019-05-27 MED ORDER — AMLODIPINE BESYLATE 10 MG PO TABS: ORAL_TABLET | ORAL | 0 refills | Status: DC

## 2019-05-27 NOTE — Telephone Encounter (Signed)
   Primary Cardiologist: Mertie Moores, MD  Chart reviewed as part of pre-operative protocol coverage. Given past medical history and time since last visit, based on ACC/AHA guidelines, [name] would be at acceptable risk for the planned procedure without further cardiovascular testing.   Per Dr. Acie Fredrickson: "Patient may hold ASA and Plavix for 5-7 days prior to colonoscopy. He is at low risk for the procedure."  I will route this recommendation to the requesting party via [facility] fax function and remove from pre-op pool.  Please call with questions.  Darreld Mclean, PA-C 05/27/2019, 8:11 AM

## 2019-06-06 ENCOUNTER — Encounter: Payer: Self-pay | Admitting: Family Medicine

## 2019-06-06 ENCOUNTER — Ambulatory Visit (INDEPENDENT_AMBULATORY_CARE_PROVIDER_SITE_OTHER): Payer: PPO | Admitting: Family Medicine

## 2019-06-06 ENCOUNTER — Other Ambulatory Visit: Payer: Self-pay

## 2019-06-06 VITALS — BP 118/72 | HR 86 | Temp 97.9°F | Resp 15 | Ht 69.0 in | Wt 262.0 lb

## 2019-06-06 DIAGNOSIS — I2510 Atherosclerotic heart disease of native coronary artery without angina pectoris: Secondary | ICD-10-CM

## 2019-06-06 DIAGNOSIS — I2583 Coronary atherosclerosis due to lipid rich plaque: Secondary | ICD-10-CM

## 2019-06-06 DIAGNOSIS — R0600 Dyspnea, unspecified: Secondary | ICD-10-CM

## 2019-06-06 DIAGNOSIS — E7800 Pure hypercholesterolemia, unspecified: Secondary | ICD-10-CM

## 2019-06-06 DIAGNOSIS — I10 Essential (primary) hypertension: Secondary | ICD-10-CM

## 2019-06-06 DIAGNOSIS — Z23 Encounter for immunization: Secondary | ICD-10-CM

## 2019-06-06 DIAGNOSIS — Z125 Encounter for screening for malignant neoplasm of prostate: Secondary | ICD-10-CM | POA: Diagnosis not present

## 2019-06-06 DIAGNOSIS — R7309 Other abnormal glucose: Secondary | ICD-10-CM | POA: Diagnosis not present

## 2019-06-06 DIAGNOSIS — R0609 Other forms of dyspnea: Secondary | ICD-10-CM

## 2019-06-06 DIAGNOSIS — I739 Peripheral vascular disease, unspecified: Secondary | ICD-10-CM

## 2019-06-06 NOTE — Progress Notes (Signed)
Subjective:    Patient ID: [name], male    DOB: 1960/06/06, 59 y.o.   MRN: [id number]  HPI Patient is here today for a checkup.  Past medical history is significant for a myocardial infarction.  The patient had CABG in 2010.  He also has a history of peripheral vascular disease, dyslipidemia, and hypertension.  His blood pressure today is well controlled at 118/72.  He denies any angina.  However over the last few months, the patient has noticed dyspnea on exertion.  He also has had orthopnea on occasion as well as paroxysmal nocturnal dyspnea.  He denies any peripheral edema.  His lungs are clear to auscultation bilaterally today with no evidence of pulmonary edema.  He denies any tachycardia or palpitations or arrhythmias.  He denies any syncope.  He is also due for prostate cancer screening as well as a booster on Pneumovax 23. Past Medical History:  Diagnosis Date  . Acute MI, inferior wall ([place]) 04/2009  . Chest pain   . COPD (chronic obstructive pulmonary disease) ([place])   . Coronary artery disease    STATUS POST CABG 01/2009  . Dyslipidemia   . Hyperlipidemia   . Hypertension   . Orthostatic hypotension   . Peripheral vascular disease ([place])   . Syncope and collapse    Past Surgical History:  Procedure Laterality Date  . ANKLE SURGERY     x 2  . COLONOSCOPY  April 2013  . CORONARY ANGIOPLASTY    . CORONARY ARTERY BYPASS GRAFT  2010  . FOOT SURGERY    . HERNIA REPAIR  01/23/12   umb hernia  . Kidney stimulator    . LEFT HEART CATH AND CORS/GRAFTS ANGIOGRAPHY N/A 06/02/2016   Procedure: Left Heart Cath and Cors/Grafts Angiography;  Surgeon: Lorretta Harp, MD;  Location: [place];  Service: Cardiovascular;  Laterality: N/A;  . LEFT HEART CATHETERIZATION WITH CORONARY/GRAFT ANGIOGRAM N/A 07/08/2012   Procedure: LEFT HEART CATHETERIZATION WITH Beatrix Fetters;  Surgeon: Thayer Headings, MD;  Location: [facility] CATH LAB;  Service: Cardiovascular;  Laterality:  N/A;  . PR VEIN BYPASS GRAFT,AORTO-FEM-POP    . STERNAL WIRES REMOVAL  07/08/2011   Procedure: STERNAL WIRES REMOVAL;  Surgeon: Grace Isaac, MD;  Location: [place];  Service: Open Heart Surgery;  Laterality: N/A;  removal of 4th wire   Current Outpatient Medications on File Prior to Visit  Medication Sig Dispense Refill  . albuterol (VENTOLIN HFA) 108 (90 Base) MCG/ACT inhaler INHALE 2 PUFFS INTO THE LUNGS EVERY 4 HOURS AS NEEDED FOR WHEEZING OR SHORTNESS OF BREATH 42.5 g 0  . Alirocumab (PRALUENT) 75 MG/ML SOAJ Inject 75 mg into the skin every 14 (fourteen) days. 6 pen 3  . amLODipine (NORVASC) 10 MG tablet TAKE 1 TABLET(10 MG) BY MOUTH DAILY 30 tablet 0  . aspirin EC 81 MG tablet Take 1 tablet (81 mg total) by mouth every morning. 90 tablet 3  . atorvastatin (LIPITOR) 80 MG tablet TAKE 1 TABLET BY MOUTH EVERY NIGHT AT BEDTIME 90 tablet 1  . cloNIDine (CATAPRES) 0.1 MG tablet Take 1 to 2 tabs daily as needed for anxiety for up to 10 days.    . clopidogrel (PLAVIX) 75 MG tablet TAKE 1 TABLET(75 MG) BY MOUTH DAILY 90 tablet 3  . furosemide (LASIX) 20 MG tablet TAKE 1 TABLET BY MOUTH 3 TIMES A WEEK AS NEEDED 12 tablet 11  . gabapentin (NEURONTIN) 400 MG capsule Take 2 capsules (800 mg  total) by mouth 2 (two) times daily. 120 capsule 6  . hydrochlorothiazide (HYDRODIURIL) 25 MG tablet Take 1 tablet (25 mg total) by mouth daily. 90 tablet 3  . losartan (COZAAR) 100 MG tablet TAKE 1 TABLET(100 MG) BY MOUTH DAILY 90 tablet 2   No current facility-administered medications on file prior to visit.   Allergies  Allergen Reactions  . Crestor [Rosuvastatin]     Muscle aches.   Social History   Socioeconomic History  . Marital status: Married    Spouse name: Not on file  . Number of children: 2  . Years of education: Not on file  . Highest education level: Not on file  Occupational History  . Occupation: Disability  Tobacco Use  . Smoking status: Former Smoker    Packs/day: 3.00     Years: 30.00    Pack years: 90.00    Types: Cigarettes, Cigars    Quit date: 01/26/2009    Years since quitting: 10.3  . Smokeless tobacco: Never Used  Substance and Sexual Activity  . Alcohol use: No  . Drug use: No  . Sexual activity: Not Currently  Other Topics Concern  . Not on file  Social History Narrative  . Not on file   Social Determinants of Health   Financial Resource Strain:   . Difficulty of Paying Living Expenses: Not on file  Food Insecurity:   . Worried About Charity fundraiser in the Last Year: Not on file  . Ran Out of Food in the Last Year: Not on file  Transportation Needs:   . Lack of Transportation (Medical): Not on file  . Lack of Transportation (Non-Medical): Not on file  Physical Activity:   . Days of Exercise per Week: Not on file  . Minutes of Exercise per Session: Not on file  Stress:   . Feeling of Stress : Not on file  Social Connections:   . Frequency of Communication with Friends and Family: Not on file  . Frequency of Social Gatherings with Friends and Family: Not on file  . Attends Religious Services: Not on file  . Active Member of Clubs or Organizations: Not on file  . Attends Archivist Meetings: Not on file  . Marital Status: Not on file  Intimate Partner Violence:   . Fear of Current or Ex-Partner: Not on file  . Emotionally Abused: Not on file  . Physically Abused: Not on file  . Sexually Abused: Not on file   Family History  Problem Relation Age of Onset  . Coronary artery disease Father 103  . Diabetes Father   . Hyperlipidemia Father   . Heart disease Father        before age 57  . Hypertension Father   . Cancer Mother        uterine  . Hyperlipidemia Mother   . Diabetes Mother   . Hypertension Mother   . Hypertension Brother      Review of Systems  All other systems reviewed and are negative.      Objective:   Physical Exam  Constitutional: He is oriented to person, place, and time. He appears  well-developed and well-nourished. No distress.  HENT:  Head: Normocephalic and atraumatic.  Right Ear: External ear normal.  Left Ear: External ear normal.  Nose: Nose normal.  Mouth/Throat: Oropharynx is clear and moist. No oropharyngeal exudate.  Eyes: Pupils are equal, round, and reactive to light. Conjunctivae and EOM are normal. Right eye exhibits no  discharge. Left eye exhibits no discharge. No scleral icterus.  Neck: No JVD present. No tracheal deviation present. No thyromegaly present.  Cardiovascular: Normal rate, regular rhythm, normal heart sounds and intact distal pulses. Exam reveals no gallop and no friction rub.  No murmur heard. Pulmonary/Chest: Effort normal and breath sounds normal. No stridor. No respiratory distress. He has no wheezes. He has no rales. He exhibits no tenderness.  Abdominal: Soft. Bowel sounds are normal. He exhibits no distension and no mass. There is no abdominal tenderness. There is no rebound and no guarding.  Musculoskeletal:        General: No tenderness, deformity or edema. Normal range of motion.     Cervical back: Normal range of motion and neck supple.  Lymphadenopathy:    He has no cervical adenopathy.  Neurological: He is alert and oriented to person, place, and time. He has normal reflexes. No cranial nerve deficit. He exhibits normal muscle tone. Coordination normal.  Skin: Skin is warm. No rash noted. He is not diaphoretic. No erythema. No pallor.  Psychiatric: He has a normal mood and affect. His behavior is normal. Judgment and thought content normal.  Vitals reviewed.         Assessment & Plan:  ASCVD (arteriosclerotic cardiovascular disease) - Plan: CBC with Differential/Platelet, COMPLETE METABOLIC PANEL WITH GFR, Lipid panel, ECHOCARDIOGRAM COMPLETE  Prostate cancer screening - Plan: PSA  Dyspnea on exertion - Plan: ECHOCARDIOGRAM COMPLETE  Pure hypercholesterolemia  Essential hypertension  Peripheral vascular disease  ([place])  Coronary artery disease due to lipid rich plaque  Patient is currently on Praluent.  I will check a fasting lipid panel.  Given his history of coronary artery disease, his goal LDL cholesterol is less than 70.  Patient recently had peripheral arterial Dopplers performed that showed no significant occlusion.  He denies any claudication today.  Patient's blood pressure today is well controlled at 118/72.  He is due for prostate cancer screening so we will check a PSA.  I will also check a CBC given his dyspnea on exertion as well as a CMP to monitor his renal function as well as liver function test.  However given his dyspnea on exertion as well as orthopnea I do believe the patient benefits from an echocardiogram particularly given his previous history to evaluate for any evidence of systolic dysfunction.  I will schedule this as soon as possible.

## 2019-06-06 NOTE — Addendum Note (Signed)
Addended by: Shary Decamp B on: 06/06/2019 02:15 PM   Modules accepted: Orders

## 2019-06-08 LAB — TEST AUTHORIZATION

## 2019-06-08 LAB — COMPLETE METABOLIC PANEL WITH GFR
AG Ratio: 1.5 (calc) (ref 1.0–2.5)
ALT: 24 U/L (ref 9–46)
AST: 24 U/L (ref 10–35)
Albumin: 4.4 g/dL (ref 3.6–5.1)
Alkaline phosphatase (APISO): 93 U/L (ref 35–144)
BUN/Creatinine Ratio: 13 (calc) (ref 6–22)
BUN: 18 mg/dL (ref 7–25)
CO2: 27 mmol/L (ref 20–32)
Calcium: 9.7 mg/dL (ref 8.6–10.3)
Chloride: 102 mmol/L (ref 98–110)
Creat: 1.36 mg/dL — ABNORMAL HIGH (ref 0.70–1.33)
GFR, Est African American: 67 mL/min/{1.73_m2} (ref >=60)
GFR, Est Non African American: 58 mL/min/{1.73_m2} — ABNORMAL LOW (ref >=60)
Globulin: 2.9 g/dL (calc) (ref 1.9–3.7)
Glucose, Bld: 179 mg/dL — ABNORMAL HIGH (ref 65–99)
Potassium: 4.8 mmol/L (ref 3.5–5.3)
Sodium: 141 mmol/L (ref 135–146)
Total Bilirubin: 0.8 mg/dL (ref 0.2–1.2)
Total Protein: 7.3 g/dL (ref 6.1–8.1)

## 2019-06-08 LAB — CBC WITH DIFFERENTIAL/PLATELET
Absolute Monocytes: 575 cells/uL (ref 200–950)
Basophils Absolute: 57 cells/uL (ref 0–200)
Basophils Relative: 0.8 %
Eosinophils Absolute: 178 cells/uL (ref 15–500)
Eosinophils Relative: 2.5 %
HCT: 49 % (ref 38.5–50.0)
Hemoglobin: 16.1 g/dL (ref 13.2–17.1)
Lymphs Abs: 1243 cells/uL (ref 850–3900)
MCH: 28 pg (ref 27.0–33.0)
MCHC: 32.9 g/dL (ref 32.0–36.0)
MCV: 85.2 fL (ref 80.0–100.0)
MPV: 11.8 fL (ref 7.5–12.5)
Monocytes Relative: 8.1 %
Neutro Abs: 5048 cells/uL (ref 1500–7800)
Neutrophils Relative %: 71.1 %
Platelets: 236 10*3/uL (ref 140–400)
RBC: 5.75 10*6/uL (ref 4.20–5.80)
RDW: 12.6 % (ref 11.0–15.0)
Total Lymphocyte: 17.5 %
WBC: 7.1 10*3/uL (ref 3.8–10.8)

## 2019-06-08 LAB — LIPID PANEL
Cholesterol: 90 mg/dL (ref <200)
HDL: 39 mg/dL — ABNORMAL LOW (ref >=40)
LDL Cholesterol (Calc): 33 mg/dL (calc)
Non-HDL Cholesterol (Calc): 51 mg/dL (calc) (ref <130)
Total CHOL/HDL Ratio: 2.3 (calc) (ref <5.0)
Triglycerides: 93 mg/dL (ref <150)

## 2019-06-08 LAB — PSA: PSA: 0.4 ng/mL (ref <=4.0)

## 2019-06-08 LAB — HEMOGLOBIN A1C W/OUT EAG: Hgb A1c MFr Bld: 6.8 % of total Hgb — ABNORMAL HIGH (ref <5.7)

## 2019-06-09 DIAGNOSIS — K5730 Diverticulosis of large intestine without perforation or abscess without bleeding: Secondary | ICD-10-CM | POA: Diagnosis not present

## 2019-06-09 DIAGNOSIS — K6389 Other specified diseases of intestine: Secondary | ICD-10-CM | POA: Diagnosis not present

## 2019-06-09 DIAGNOSIS — K635 Polyp of colon: Secondary | ICD-10-CM | POA: Diagnosis not present

## 2019-06-09 DIAGNOSIS — Z86010 Personal history of colonic polyps: Secondary | ICD-10-CM | POA: Diagnosis not present

## 2019-06-09 DIAGNOSIS — D125 Benign neoplasm of sigmoid colon: Secondary | ICD-10-CM | POA: Diagnosis not present

## 2019-06-09 DIAGNOSIS — D122 Benign neoplasm of ascending colon: Secondary | ICD-10-CM | POA: Diagnosis not present

## 2019-06-10 ENCOUNTER — Other Ambulatory Visit: Payer: Self-pay | Admitting: Family Medicine

## 2019-06-10 MED ORDER — METFORMIN HCL 500 MG PO TABS: 1000.0000 mg | ORAL_TABLET | Freq: Two times a day (BID) | ORAL | 3 refills | Status: DC

## 2019-06-20 ENCOUNTER — Ambulatory Visit (HOSPITAL_COMMUNITY): Payer: PPO | Attending: Cardiovascular Disease

## 2019-06-20 ENCOUNTER — Other Ambulatory Visit: Payer: Self-pay

## 2019-06-20 DIAGNOSIS — I2510 Atherosclerotic heart disease of native coronary artery without angina pectoris: Secondary | ICD-10-CM | POA: Diagnosis not present

## 2019-06-20 DIAGNOSIS — R0609 Other forms of dyspnea: Secondary | ICD-10-CM

## 2019-06-20 DIAGNOSIS — R0600 Dyspnea, unspecified: Secondary | ICD-10-CM

## 2019-06-22 ENCOUNTER — Other Ambulatory Visit: Payer: Self-pay | Admitting: Family Medicine

## 2019-06-22 DIAGNOSIS — I10 Essential (primary) hypertension: Secondary | ICD-10-CM

## 2019-06-22 MED ORDER — AMLODIPINE BESYLATE 10 MG PO TABS: ORAL_TABLET | ORAL | 3 refills | Status: DC

## 2019-07-24 ENCOUNTER — Emergency Department (HOSPITAL_COMMUNITY): Payer: PPO

## 2019-07-24 ENCOUNTER — Other Ambulatory Visit: Payer: Self-pay

## 2019-07-24 ENCOUNTER — Encounter (HOSPITAL_COMMUNITY): Payer: Self-pay

## 2019-07-24 ENCOUNTER — Emergency Department (HOSPITAL_COMMUNITY)
Admission: EM | Admit: 2019-07-24 | Discharge: 2019-07-24 | Disposition: A | Discharge status: Home / Self Care | Payer: PPO | Attending: Emergency Medicine | Admitting: Emergency Medicine

## 2019-07-24 DIAGNOSIS — M25562 Pain in left knee: Secondary | ICD-10-CM | POA: Insufficient documentation

## 2019-07-24 DIAGNOSIS — J449 Chronic obstructive pulmonary disease, unspecified: Secondary | ICD-10-CM | POA: Insufficient documentation

## 2019-07-24 DIAGNOSIS — Z7901 Long term (current) use of anticoagulants: Secondary | ICD-10-CM | POA: Diagnosis not present

## 2019-07-24 DIAGNOSIS — Z7984 Long term (current) use of oral hypoglycemic drugs: Secondary | ICD-10-CM | POA: Diagnosis not present

## 2019-07-24 DIAGNOSIS — I252 Old myocardial infarction: Secondary | ICD-10-CM | POA: Diagnosis not present

## 2019-07-24 DIAGNOSIS — I10 Essential (primary) hypertension: Secondary | ICD-10-CM | POA: Insufficient documentation

## 2019-07-24 DIAGNOSIS — Z87891 Personal history of nicotine dependence: Secondary | ICD-10-CM | POA: Insufficient documentation

## 2019-07-24 DIAGNOSIS — Z7982 Long term (current) use of aspirin: Secondary | ICD-10-CM | POA: Diagnosis not present

## 2019-07-24 DIAGNOSIS — Z79899 Other long term (current) drug therapy: Secondary | ICD-10-CM | POA: Insufficient documentation

## 2019-07-24 DIAGNOSIS — M1712 Unilateral primary osteoarthritis, left knee: Secondary | ICD-10-CM | POA: Diagnosis not present

## 2019-07-24 MED ORDER — METHOCARBAMOL 500 MG PO TABS
750.0000 mg | ORAL_TABLET | Freq: Once | ORAL | Status: AC
  Administered 2019-07-24: 750 mg via ORAL

## 2019-07-24 MED ORDER — IBUPROFEN 800 MG PO TABS: 800.0000 mg | ORAL_TABLET | Freq: Three times a day (TID) | ORAL | 0 refills | Status: DC

## 2019-07-24 MED ORDER — IBUPROFEN 800 MG PO TABS
800.0000 mg | ORAL_TABLET | Freq: Once | ORAL | Status: DC
  Filled 2019-07-24: qty 1

## 2019-07-24 MED ORDER — IBUPROFEN 800 MG PO TABS
800.0000 mg | ORAL_TABLET | Freq: Once | ORAL | Status: AC
  Administered 2019-07-24: 800 mg via ORAL

## 2019-07-24 MED ORDER — METHOCARBAMOL 500 MG PO TABS
750.0000 mg | ORAL_TABLET | Freq: Four times a day (QID) | ORAL | Status: DC
  Filled 2019-07-24: qty 2

## 2019-07-24 MED ORDER — METHOCARBAMOL 500 MG PO TABS: 500.0000 mg | ORAL_TABLET | Freq: Two times a day (BID) | ORAL | 0 refills | Status: DC

## 2019-07-24 NOTE — ED Triage Notes (Signed)
Pt reports intermittent left knee pain for the past month.  Reports got worse since yesterday morning.  Pt says pain starts in left knee and radiates up into groin.

## 2019-07-24 NOTE — Discharge Instructions (Addendum)
You were seen in the ER for left knee pain.  Your x-rays did not show any acute fractures or life-threatening causes of your pain.  Your pain is likely due to inflammation of the muscles in your leg and your knees.  I will prescribe a muscle relaxer and anti-inflammatory to take home.  Please take as directed.  Please follow-up with your primary care provider if your pain does not improve.  Return to the ER if your symptoms worsen.

## 2019-07-24 NOTE — ED Provider Notes (Signed)
[facility] EMERGENCY DEPARTMENT Provider Note   CSN: [id number] Arrival date & time: 07/24/19  [id number]     History Chief Complaint  Patient presents with  . Knee Pain    [name] is a 59 y.o. male.  HPI 59 year old male with a history of CAD, hypertension, COPD, and chronic low back pain presents to the ER with left-sided knee pain that radiates into his thigh and into his groin occasionally which started 2 days ago.  He states he has had pain like this before but has never gotten this severe.  Currently rates his pain 8/10.  He does not recall any injury or inciting event that caused this pain.  He states he feels this pain at rest.  Patient has a history of chronic low back pain and has a spinal cord stimulator in his left lower back.  He states he has taken gabapentin and narcotics prescribed by his PCP but this has not alleviated his pain.He denies any weakness, numbness, bowel bladder incontinence, urinary retention.  He is able to ambulate but with a limp due to his pain.    Past Medical History:  Diagnosis Date  . Acute MI, inferior wall ([place]) 04/2009  . Chest pain   . COPD (chronic obstructive pulmonary disease) ([place])   . Coronary artery disease    STATUS POST CABG 01/2009  . Dyslipidemia   . Hyperlipidemia   . Hypertension   . Orthostatic hypotension   . Peripheral vascular disease ([place])   . Syncope and collapse     Patient Active Problem List   Diagnosis Date Noted  . Mixed hyperlipidemia 02/23/2017  . Peripheral vascular disease ([place]) 06/01/2016  . Nasolacrimal duct obstruction, acquired, right 03/11/2016  . Medication side effects 06/28/2014  . COPD (chronic obstructive pulmonary disease) ([place]) 05/24/2013  . Chronic, continuous use of opioids 06/14/2012  . Fecal incontinence 05/18/2012  . Umbilical hernia [id number]  . Increased frequency of urination 12/11/2011  . Prostatitis 11/13/2011  . Atherosclerosis of native arteries of the extremities with  intermittent claudication 10/03/2011  . Pain in limb 10/03/2011  . Fecal smearing 09/26/2011  . Acquired hindfoot varus 08/12/2011  . Instability of ankle joint 08/12/2011  . Painful sternal wire 06/30/2011  . Encounter for therapeutic drug monitoring 04/06/2011  . Sural neuritis 04/06/2011  . Ankle pain 01/31/2011  . Peripheral neuropathy 01/31/2011  . Leg cramps 11/12/2010  . CAD (coronary artery disease)   . Hx of Inferior MI   . Essential hypertension   . Dyspnea   . Orthostatic hypotension   . History of syncope     Past Surgical History:  Procedure Laterality Date  . ANKLE SURGERY     x 2  . COLONOSCOPY  April 2013  . CORONARY ANGIOPLASTY    . CORONARY ARTERY BYPASS GRAFT  2010  . FOOT SURGERY    . HERNIA REPAIR  01/23/12   umb hernia  . Kidney stimulator    . LEFT HEART CATH AND CORS/GRAFTS ANGIOGRAPHY N/A 06/02/2016   Procedure: Left Heart Cath and Cors/Grafts Angiography;  Surgeon: Lorretta Harp, MD;  Location: [place] [facility];  Service: Cardiovascular;  Laterality: N/A;  . LEFT HEART CATHETERIZATION WITH CORONARY/GRAFT ANGIOGRAM N/A 07/08/2012   Procedure: LEFT HEART CATHETERIZATION WITH Beatrix Fetters;  Surgeon: Thayer Headings, MD;  Location: [facility] CATH LAB;  Service: Cardiovascular;  Laterality: N/A;  . PR VEIN BYPASS GRAFT,AORTO-FEM-POP    . STERNAL WIRES REMOVAL  07/08/2011   Procedure: STERNAL  WIRES REMOVAL;  Surgeon: Grace Isaac, MD;  Location: [place];  Service: Open Heart Surgery;  Laterality: N/A;  removal of 4th wire       Family History  Problem Relation Age of Onset  . Coronary artery disease Father 57  . Diabetes Father   . Hyperlipidemia Father   . Heart disease Father        before age 20  . Hypertension Father   . Cancer Mother        uterine  . Hyperlipidemia Mother   . Diabetes Mother   . Hypertension Mother   . Hypertension Brother     Social History   Tobacco Use  . Smoking status: Former Smoker    Packs/day: 3.00     Years: 30.00    Pack years: 90.00    Types: Cigarettes, Cigars    Quit date: 01/26/2009    Years since quitting: 10.4  . Smokeless tobacco: Never Used  Substance Use Topics  . Alcohol use: No  . Drug use: No    Home Medications Prior to Admission medications   Medication Sig Start Date End Date Taking? Authorizing Provider  albuterol (VENTOLIN HFA) 108 (90 Base) MCG/ACT inhaler INHALE 2 PUFFS INTO THE LUNGS EVERY 4 HOURS AS NEEDED FOR WHEEZING OR SHORTNESS OF BREATH 08/23/18   Susy Frizzle, MD  Alirocumab (PRALUENT) 75 MG/ML SOAJ Inject 75 mg into the skin every 14 (fourteen) days. 12/22/18   Nahser, Wonda Cheng, MD  amLODipine (NORVASC) 10 MG tablet TAKE 1 TABLET(10 MG) BY MOUTH DAILY 06/22/19   Susy Frizzle, MD  aspirin EC 81 MG tablet Take 1 tablet (81 mg total) by mouth every morning. 11/07/16   Susy Frizzle, MD  atorvastatin (LIPITOR) 80 MG tablet TAKE 1 TABLET BY MOUTH EVERY NIGHT AT BEDTIME 05/23/19   Susy Frizzle, MD  cloNIDine (CATAPRES) 0.1 MG tablet Take 1 to 2 tabs daily as needed for anxiety for up to 10 days. 11/17/18   [provider]  clopidogrel (PLAVIX) 75 MG tablet TAKE 1 TABLET(75 MG) BY MOUTH DAILY 03/10/19   Nahser, Wonda Cheng, MD  furosemide (LASIX) 20 MG tablet TAKE 1 TABLET BY MOUTH 3 TIMES A WEEK AS NEEDED 03/10/19   Nahser, Wonda Cheng, MD  gabapentin (NEURONTIN) 400 MG capsule Take 2 capsules (800 mg total) by mouth 2 (two) times daily. 12/20/13   Elam Dutch, MD  hydrochlorothiazide (HYDRODIURIL) 25 MG tablet Take 1 tablet (25 mg total) by mouth daily. 12/22/18   Nahser, Wonda Cheng, MD  ibuprofen (ADVIL) 800 MG tablet Take 1 tablet (800 mg total) by mouth 3 (three) times daily. 07/24/19   Garald Balding, PA-C  losartan (COZAAR) 100 MG tablet TAKE 1 TABLET(100 MG) BY MOUTH DAILY 02/08/19   Nahser, Wonda Cheng, MD  metFORMIN (GLUCOPHAGE) 500 MG tablet Take 2 tablets (1,000 mg total) by mouth 2 (two) times daily with a meal. 06/10/19   Susy Frizzle, MD  methocarbamol (ROBAXIN) 500 MG tablet Take 1 tablet (500 mg total) by mouth 2 (two) times daily. 07/24/19   Garald Balding, PA-C    Allergies    Crestor [rosuvastatin]  Review of Systems   Review of Systems  Constitutional: Negative for chills and fever.  Musculoskeletal: Positive for arthralgias, back pain and gait problem. Negative for joint swelling and myalgias.  Skin: Negative for color change, rash and wound.  Neurological: Negative for weakness and numbness.  All other systems reviewed and  are negative.   Physical Exam Updated Vital Signs BP (!) 164/101 (BP Location: Right Arm)   Pulse 75   Temp (!) 97.5 F (36.4 C) (Oral)   Resp 20   Ht 6' (1.829 m)   Wt 117.9 kg   SpO2 100%   BMI 35.26 kg/m   Physical Exam Constitutional:      General: He is not in acute distress.    Appearance: Normal appearance. He is obese. He is not toxic-appearing.  Cardiovascular:     Rate and Rhythm: Normal rate and regular rhythm.  Pulmonary:     Effort: Pulmonary effort is normal.     Breath sounds: Normal breath sounds.  Musculoskeletal:        General: Tenderness present. No swelling, deformity or signs of injury. Normal range of motion.     Right lower leg: No edema.     Left lower leg: No edema.     Comments: Tenderness to palpation along medial aspect of left knee, mild tenderness to left quadricep.  No appreciable redness, warmth, swelling, masses.  Pain is not reproducible on palpation of SI joint.  Full range of motion of left knee and back, patient able to ambulate with a slight limp in room.  Neurological:     Mental Status: He is alert.     ED Results / Procedures / Treatments   Labs (all labs ordered are listed, but only abnormal results are displayed) Labs Reviewed - No data to display  EKG None  Radiology DG Knee Complete 4 Views Left  Result Date: 07/24/2019 CLINICAL DATA:  Intermittent left knee pain for multiple months. EXAM: LEFT KNEE -  COMPLETE 4+ VIEW COMPARISON:  None. FINDINGS: Normal anatomic alignment. No evidence for acute fracture or dislocation. Tricompartmental osteophytosis. Regional soft tissues unremarkable. IMPRESSION: Degenerative changes.  No acute osseous abnormality. Electronically Signed   By: Lovey Newcomer M.D.   On: 07/24/2019 10:48    Procedures Procedures (including critical care time)  Medications Ordered in ED Medications  ibuprofen (ADVIL) tablet 800 mg (800 mg Oral Given 07/24/19 1149)  methocarbamol (ROBAXIN) tablet 750 mg (750 mg Oral Given 07/24/19 1148)    ED Course  I have reviewed the triage vital signs and the nursing notes.  Pertinent labs & imaging results that were available during my care of the patient were reviewed by me and considered in my medical decision making (see chart for details).    MDM Rules/Calculators/A&P                     59 year old male presents presents to the ER with a 2-day history of left knee/quad pain. Patient hypertensive, afebrile, not suggestive of septic joint.  No erythema, swelling noted to the knee, patient is tender to palpation on the medial aspect of the knee and globally to right quad.  No swelling to left lower extremity, shortness of breath, recent travel.  Straight leg test negative.  Full range of motion to left knee and patient was able to ambulate in the room.  Plain films of left knee showed degenerative changes but no acute osseous abnormalities or fractures.  Discussed the x-ray findings with the patient, discussed discharging with anti-inflammatory and muscle relaxer for symptomatic management.  Encouraged follow-up with primary care provider if symptoms do not improve.  Patient voices understanding and agrees with this plan.  Discussed return precautions which includes increased swelling, heat, fever, discharge from the joint, groin numbness, sudden bowel or bladder incontinence  or urinary retention.  At this point the patient stable for  discharge.   Final Clinical Impression(s) / ED Diagnoses Final diagnoses:  Acute pain of left knee    Rx / DC Orders ED Discharge Orders         Ordered    ibuprofen (ADVIL) 800 MG tablet  3 times daily     07/24/19 1139    methocarbamol (ROBAXIN) 500 MG tablet  2 times daily     07/24/19 1139           Lyndel Safe 07/24/19 1150    Noemi Chapel, MD 07/25/19 1730

## 2019-08-10 DIAGNOSIS — G8921 Chronic pain due to trauma: Secondary | ICD-10-CM | POA: Diagnosis not present

## 2019-08-10 DIAGNOSIS — M79672 Pain in left foot: Secondary | ICD-10-CM | POA: Diagnosis not present

## 2019-08-10 DIAGNOSIS — Z79899 Other long term (current) drug therapy: Secondary | ICD-10-CM | POA: Diagnosis not present

## 2019-08-10 DIAGNOSIS — Z5181 Encounter for therapeutic drug level monitoring: Secondary | ICD-10-CM | POA: Diagnosis not present

## 2019-08-10 DIAGNOSIS — G894 Chronic pain syndrome: Secondary | ICD-10-CM | POA: Diagnosis not present

## 2019-08-10 DIAGNOSIS — M25572 Pain in left ankle and joints of left foot: Secondary | ICD-10-CM | POA: Diagnosis not present

## 2019-08-17 ENCOUNTER — Ambulatory Visit: Payer: PPO | Admitting: Physician Assistant

## 2019-11-09 DIAGNOSIS — G8921 Chronic pain due to trauma: Secondary | ICD-10-CM | POA: Diagnosis not present

## 2019-11-09 DIAGNOSIS — M79672 Pain in left foot: Secondary | ICD-10-CM | POA: Diagnosis not present

## 2019-11-09 DIAGNOSIS — G894 Chronic pain syndrome: Secondary | ICD-10-CM | POA: Diagnosis not present

## 2019-11-09 DIAGNOSIS — M25572 Pain in left ankle and joints of left foot: Secondary | ICD-10-CM | POA: Diagnosis not present

## 2019-12-12 ENCOUNTER — Encounter (HOSPITAL_COMMUNITY): Payer: Self-pay | Admitting: *Deleted

## 2019-12-12 ENCOUNTER — Emergency Department (HOSPITAL_COMMUNITY)
Admission: EM | Admit: 2019-12-12 | Discharge: 2019-12-12 | Disposition: A | Discharge status: Home / Self Care | Payer: PPO | Attending: Emergency Medicine | Admitting: Emergency Medicine

## 2019-12-12 ENCOUNTER — Other Ambulatory Visit: Payer: Self-pay

## 2019-12-12 DIAGNOSIS — I252 Old myocardial infarction: Secondary | ICD-10-CM | POA: Diagnosis not present

## 2019-12-12 DIAGNOSIS — Z7982 Long term (current) use of aspirin: Secondary | ICD-10-CM | POA: Diagnosis not present

## 2019-12-12 DIAGNOSIS — Z79899 Other long term (current) drug therapy: Secondary | ICD-10-CM | POA: Insufficient documentation

## 2019-12-12 DIAGNOSIS — Z87891 Personal history of nicotine dependence: Secondary | ICD-10-CM | POA: Insufficient documentation

## 2019-12-12 DIAGNOSIS — Z955 Presence of coronary angioplasty implant and graft: Secondary | ICD-10-CM | POA: Diagnosis not present

## 2019-12-12 DIAGNOSIS — W268XXA Contact with other sharp object(s), not elsewhere classified, initial encounter: Secondary | ICD-10-CM | POA: Diagnosis not present

## 2019-12-12 DIAGNOSIS — J449 Chronic obstructive pulmonary disease, unspecified: Secondary | ICD-10-CM | POA: Insufficient documentation

## 2019-12-12 DIAGNOSIS — Y929 Unspecified place or not applicable: Secondary | ICD-10-CM | POA: Diagnosis not present

## 2019-12-12 DIAGNOSIS — I2510 Atherosclerotic heart disease of native coronary artery without angina pectoris: Secondary | ICD-10-CM | POA: Insufficient documentation

## 2019-12-12 DIAGNOSIS — I10 Essential (primary) hypertension: Secondary | ICD-10-CM | POA: Diagnosis not present

## 2019-12-12 DIAGNOSIS — Y939 Activity, unspecified: Secondary | ICD-10-CM | POA: Insufficient documentation

## 2019-12-12 DIAGNOSIS — Y999 Unspecified external cause status: Secondary | ICD-10-CM | POA: Diagnosis not present

## 2019-12-12 DIAGNOSIS — S61011A Laceration without foreign body of right thumb without damage to nail, initial encounter: Secondary | ICD-10-CM | POA: Diagnosis not present

## 2019-12-12 DIAGNOSIS — Z23 Encounter for immunization: Secondary | ICD-10-CM | POA: Insufficient documentation

## 2019-12-12 MED ORDER — LIDOCAINE HCL (PF) 2 % IJ SOLN: 10.0000 mL | Freq: Once | INTRAMUSCULAR | Status: DC

## 2019-12-12 MED ORDER — TETANUS-DIPHTH-ACELL PERTUSSIS 5-2.5-18.5 LF-MCG/0.5 IM SUSP
0.5000 mL | Freq: Once | INTRAMUSCULAR | Status: AC
  Administered 2019-12-12: 0.5 mL via INTRAMUSCULAR
  Filled 2019-12-12: qty 0.5

## 2019-12-12 MED ORDER — LIDOCAINE HCL (PF) 2 % IJ SOLN
INTRAMUSCULAR | Status: AC
  Filled 2019-12-12: qty 10

## 2019-12-12 NOTE — ED Triage Notes (Signed)
Laceration to right thumb

## 2019-12-12 NOTE — Discharge Instructions (Addendum)
Local wound care with bacitracin and dressing changes twice daily.  Sutures are to be removed in 7 to 10 days.  Please follow-up with your primary doctor for this.  Return to the ER in the meantime if you develop increased redness, increased pain, pus draining from the wound, streaks extending up the wrist or arm, or other new and concerning symptoms.

## 2019-12-12 NOTE — ED Provider Notes (Signed)
[facility] EMERGENCY DEPARTMENT Provider Note   CSN: [id number] Arrival date & time: 12/12/19  1301     History Chief Complaint  Patient presents with  . Laceration    [name] is a 59 y.o. male.  Patient is a 59 year old male with past medical history of COPD, hypertension, hyperlipidemia.  He presents today for evaluation of a thumb laceration.  He was working in the driveway when he sliced his hand on a sharp piece of metal.  He has a laceration to the right thumb.  Last tetanus shot unknown.  The history is provided by the patient.  Laceration Location:  Hand Hand laceration location:  R hand Depth:  Through dermis Quality: avulsion   Bleeding: venous   Pain details:    Quality:  Throbbing   Severity:  Moderate   Timing:  Constant   Progression:  Unchanged Relieved by:  Nothing Worsened by:  Nothing Ineffective treatments:  None tried      Past Medical History:  Diagnosis Date  . Acute MI, inferior wall ([place]) 04/2009  . Chest pain   . COPD (chronic obstructive pulmonary disease) ([place])   . Coronary artery disease    STATUS POST CABG 01/2009  . Dyslipidemia   . Hyperlipidemia   . Hypertension   . Orthostatic hypotension   . Peripheral vascular disease ([place])   . Syncope and collapse     Patient Active Problem List   Diagnosis Date Noted  . Mixed hyperlipidemia 02/23/2017  . Peripheral vascular disease ([place]) 06/01/2016  . Nasolacrimal duct obstruction, acquired, right 03/11/2016  . Medication side effects 06/28/2014  . COPD (chronic obstructive pulmonary disease) ([place]) 05/24/2013  . Chronic, continuous use of opioids 06/14/2012  . Fecal incontinence 05/18/2012  . Umbilical hernia 22/97/9892  . Increased frequency of urination 12/11/2011  . Prostatitis 11/13/2011  . Atherosclerosis of native arteries of the extremities with intermittent claudication 10/03/2011  . Pain in limb 10/03/2011  . Fecal smearing 09/26/2011  . Acquired hindfoot varus  08/12/2011  . Instability of ankle joint 08/12/2011  . Painful sternal wire 06/30/2011  . Encounter for therapeutic drug monitoring 04/06/2011  . Sural neuritis 04/06/2011  . Ankle pain 01/31/2011  . Peripheral neuropathy 01/31/2011  . Leg cramps 11/12/2010  . CAD (coronary artery disease)   . Hx of Inferior MI   . Essential hypertension   . Dyspnea   . Orthostatic hypotension   . History of syncope     Past Surgical History:  Procedure Laterality Date  . ANKLE SURGERY     x 2  . COLONOSCOPY  April 2013  . CORONARY ANGIOPLASTY    . CORONARY ARTERY BYPASS GRAFT  2010  . FOOT SURGERY    . HERNIA REPAIR  01/23/12   umb hernia  . Kidney stimulator    . LEFT HEART CATH AND CORS/GRAFTS ANGIOGRAPHY N/A 06/02/2016   Procedure: Left Heart Cath and Cors/Grafts Angiography;  Surgeon: Lorretta Harp, MD;  Location: [place] CV LAB;  Service: Cardiovascular;  Laterality: N/A;  . LEFT HEART CATHETERIZATION WITH CORONARY/GRAFT ANGIOGRAM N/A 07/08/2012   Procedure: LEFT HEART CATHETERIZATION WITH Beatrix Fetters;  Surgeon: Thayer Headings, MD;  Location: [facility] CATH LAB;  Service: Cardiovascular;  Laterality: N/A;  . PR VEIN BYPASS GRAFT,AORTO-FEM-POP    . STERNAL WIRES REMOVAL  07/08/2011   Procedure: STERNAL WIRES REMOVAL;  Surgeon: Grace Isaac, MD;  Location: [place];  Service: Open Heart Surgery;  Laterality: N/A;  removal of 4th  wire       Family History  Problem Relation Age of Onset  . Coronary artery disease Father 24  . Diabetes Father   . Hyperlipidemia Father   . Heart disease Father        before age 26  . Hypertension Father   . Cancer Mother        uterine  . Hyperlipidemia Mother   . Diabetes Mother   . Hypertension Mother   . Hypertension Brother     Social History   Tobacco Use  . Smoking status: Former Smoker    Packs/day: 3.00    Years: 30.00    Pack years: 90.00    Types: Cigarettes, Cigars    Quit date: 01/26/2009    Years since quitting:  10.8  . Smokeless tobacco: Never Used  Vaping Use  . Vaping Use: Never used  Substance Use Topics  . Alcohol use: No  . Drug use: No    Home Medications Prior to Admission medications   Medication Sig Start Date End Date Taking? Authorizing Provider  albuterol (VENTOLIN HFA) 108 (90 Base) MCG/ACT inhaler INHALE 2 PUFFS INTO THE LUNGS EVERY 4 HOURS AS NEEDED FOR WHEEZING OR SHORTNESS OF BREATH 08/23/18   Susy Frizzle, MD  Alirocumab (PRALUENT) 75 MG/ML SOAJ Inject 75 mg into the skin every 14 (fourteen) days. 12/22/18   Nahser, Wonda Cheng, MD  amLODipine (NORVASC) 10 MG tablet TAKE 1 TABLET(10 MG) BY MOUTH DAILY 06/22/19   Susy Frizzle, MD  aspirin EC 81 MG tablet Take 1 tablet (81 mg total) by mouth every morning. 11/07/16   Susy Frizzle, MD  atorvastatin (LIPITOR) 80 MG tablet TAKE 1 TABLET BY MOUTH EVERY NIGHT AT BEDTIME 05/23/19   Susy Frizzle, MD  cloNIDine (CATAPRES) 0.1 MG tablet Take 1 to 2 tabs daily as needed for anxiety for up to 10 days. 11/17/18   [provider]  clopidogrel (PLAVIX) 75 MG tablet TAKE 1 TABLET(75 MG) BY MOUTH DAILY 03/10/19   Nahser, Wonda Cheng, MD  furosemide (LASIX) 20 MG tablet TAKE 1 TABLET BY MOUTH 3 TIMES A WEEK AS NEEDED 03/10/19   Nahser, Wonda Cheng, MD  gabapentin (NEURONTIN) 400 MG capsule Take 2 capsules (800 mg total) by mouth 2 (two) times daily. 12/20/13   Elam Dutch, MD  hydrochlorothiazide (HYDRODIURIL) 25 MG tablet Take 1 tablet (25 mg total) by mouth daily. 12/22/18   Nahser, Wonda Cheng, MD  ibuprofen (ADVIL) 800 MG tablet Take 1 tablet (800 mg total) by mouth 3 (three) times daily. 07/24/19   Garald Balding, PA-C  losartan (COZAAR) 100 MG tablet TAKE 1 TABLET(100 MG) BY MOUTH DAILY 02/08/19   Nahser, Wonda Cheng, MD  metFORMIN (GLUCOPHAGE) 500 MG tablet Take 2 tablets (1,000 mg total) by mouth 2 (two) times daily with a meal. 06/10/19   Susy Frizzle, MD  methocarbamol (ROBAXIN) 500 MG tablet Take 1 tablet (500 mg total) by  mouth 2 (two) times daily. 07/24/19   Garald Balding, PA-C    Allergies    Crestor [rosuvastatin]  Review of Systems   Review of Systems  All other systems reviewed and are negative.   Physical Exam Updated Vital Signs BP (!) 156/108 (BP Location: Left Arm)   Pulse 87   Temp 98.3 F (36.8 C) (Oral)   Resp 20   Ht 5\' 9"  (1.753 m)   Wt 113.4 kg   SpO2 97%   BMI 36.92 kg/m  Physical Exam Vitals and nursing note reviewed.  Constitutional:      General: He is not in acute distress.    Appearance: Normal appearance. He is not ill-appearing.  HENT:     Head: Normocephalic and atraumatic.  Pulmonary:     Effort: Pulmonary effort is normal.  Musculoskeletal:     Comments: The right thumb has a U-shaped, 2.5 cm, flapped laceration extending from the cuticle toward the IP joint and back toward the finger pad.  Skin:    General: Skin is warm and dry.  Neurological:     Mental Status: He is alert.     ED Results / Procedures / Treatments   Labs (all labs ordered are listed, but only abnormal results are displayed) Labs Reviewed - No data to display  EKG None  Radiology No results found.  Procedures Procedures (including critical care time)  Medications Ordered in ED Medications  lidocaine HCl (PF) (XYLOCAINE) 2 % injection 10 mL (has no administration in time range)  lidocaine HCl (PF) (XYLOCAINE) 2 % injection (has no administration in time range)  Tdap (BOOSTRIX) injection 0.5 mL (has no administration in time range)    ED Course  I have reviewed the triage vital signs and the nursing notes.  Pertinent labs & imaging results that were available during my care of the patient were reviewed by me and considered in my medical decision making (see chart for details).    MDM Rules/Calculators/A&P  The patient presents with a laceration to the palm as described in the physical exam.  A digital block was performed using 2% lidocaine.  The wound was cleaned and  irrigated copiously with normal saline, then closed with 4-0 Prolene as below.  Tetanus shot to be updated.  Patient to have sutures removed in 7 to 10 days, to return as needed for signs of infection.  LACERATION REPAIR Performed by: Veryl Speak Authorized by: Veryl Speak Consent: Verbal consent obtained. Risks and benefits: risks, benefits and alternatives were discussed Consent given by: patient Patient identity confirmed: provided demographic data Prepped and Draped in normal sterile fashion Wound explored  Laceration Location: right thumb  Laceration Length: 2.5cm  No Foreign Bodies seen or palpated  Anesthesia: local infiltration  Local anesthetic: lidocaine 2% without epinephrine  Anesthetic total: 5 ml  Irrigation method: syringe Amount of cleaning: standard  Skin closure: 4-0 prolene  Number of sutures: 6  Technique: simple interrupted  Patient tolerance: Patient tolerated the procedure well with no immediate complications.   Final Clinical Impression(s) / ED Diagnoses Final diagnoses:  None    Rx / DC Orders ED Discharge Orders    None       Veryl Speak, MD 12/12/19 1352

## 2019-12-12 NOTE — ED Notes (Signed)
Thumb cleaned and dressing applied.

## 2019-12-20 ENCOUNTER — Ambulatory Visit
Admission: EM | Admit: 2019-12-20 | Discharge: 2019-12-20 | Disposition: A | Discharge status: Home / Self Care | Payer: PPO | Attending: Emergency Medicine | Admitting: Emergency Medicine

## 2019-12-20 ENCOUNTER — Other Ambulatory Visit: Payer: Self-pay

## 2019-12-20 DIAGNOSIS — S61021A Laceration with foreign body of right thumb without damage to nail, initial encounter: Secondary | ICD-10-CM

## 2019-12-20 DIAGNOSIS — L089 Local infection of the skin and subcutaneous tissue, unspecified: Secondary | ICD-10-CM | POA: Diagnosis not present

## 2019-12-20 DIAGNOSIS — T148XXA Other injury of unspecified body region, initial encounter: Secondary | ICD-10-CM

## 2019-12-20 MED ORDER — CEPHALEXIN 500 MG PO CAPS: 500.0000 mg | ORAL_CAPSULE | Freq: Four times a day (QID) | ORAL | 0 refills | Status: DC

## 2019-12-20 NOTE — Discharge Instructions (Signed)
Suture was removed Clean with warm water and mild soap.  Avoid submerging wound in water. Apply a thin layer of neosporin.    Take OTC ibuprofen or tylenol as needed for pain releif Return sooner or go to the ED if you have any new or worsening symptoms such as increased pain, redness, swelling, drainage, discharge, decreased range of motion of extremity, etc..

## 2019-12-20 NOTE — ED Triage Notes (Addendum)
Pt presents with complaints of needing to have sutures removed. Area is red, swollen, warm, and tender to touch. Komlanvi at bedside to assess.

## 2019-12-20 NOTE — ED Provider Notes (Signed)
[place]   [id number] 12/20/19 Arrival Time: 0907  Chief Complaint  Patient presents with  . Laceration     SUBJECTIVE:  [name] is a 59 y.o. male who presented to the urgent care with a complaint of laceration to right thumb that occured days ago.  Stated he is here for suture removal. Currently not on blood thinners.  Denies similar symptoms in the past.  Denies fever, chills, nausea, vomiting, redness, swelling, purulent drainage, decrease strength or sensation.   Td UTD: Yes.  ROS: As per HPI.  All other pertinent ROS negative.     Past Medical History:  Diagnosis Date  . Acute MI, inferior wall ([place]) 04/2009  . Chest pain   . COPD (chronic obstructive pulmonary disease) ([place])   . Coronary artery disease    STATUS POST CABG 01/2009  . Dyslipidemia   . Hyperlipidemia   . Hypertension   . Orthostatic hypotension   . Peripheral vascular disease ([place])   . Syncope and collapse    Past Surgical History:  Procedure Laterality Date  . ANKLE SURGERY     x 2  . COLONOSCOPY  April 2013  . CORONARY ANGIOPLASTY    . CORONARY ARTERY BYPASS GRAFT  2010  . FOOT SURGERY    . HERNIA REPAIR  01/23/12   umb hernia  . Kidney stimulator    . LEFT HEART CATH AND CORS/GRAFTS ANGIOGRAPHY N/A 06/02/2016   Procedure: Left Heart Cath and Cors/Grafts Angiography;  Surgeon: Lorretta Harp, MD;  Location: [place] CV LAB;  Service: Cardiovascular;  Laterality: N/A;  . LEFT HEART CATHETERIZATION WITH CORONARY/GRAFT ANGIOGRAM N/A 07/08/2012   Procedure: LEFT HEART CATHETERIZATION WITH Beatrix Fetters;  Surgeon: Thayer Headings, MD;  [facility] C[facility] LAB;  Service: Cardiovascular;  Laterality: N/A;  . PR VEIN BYPASS GRAFT,AORTO-FEM-POP    . STERNAL WIRES REMOVAL  07/08/2011   Procedure: STERNAL WIRES REMOVAL;  Surgeon: Grace Isaac, MD;  Location: [place];  Service: Open Heart Surgery;  Laterality: N/A;  removal of 4th wire   Allergies  Allergen Reactions  .  Crestor [Rosuvastatin]     Muscle aches.   No current facility-administered medications on file prior to encounter.   Current Outpatient Medications on File Prior to Encounter  Medication Sig Dispense Refill  . albuterol (VENTOLIN HFA) 108 (90 Base) MCG/ACT inhaler INHALE 2 PUFFS INTO THE LUNGS EVERY 4 HOURS AS NEEDED FOR WHEEZING OR SHORTNESS OF BREATH 42.5 g 0  . Alirocumab (PRALUENT) 75 MG/ML SOAJ Inject 75 mg into the skin every 14 (fourteen) days. 6 pen 3  . amLODipine (NORVASC) 10 MG tablet TAKE 1 TABLET(10 MG) BY MOUTH DAILY 90 tablet 3  . aspirin EC 81 MG tablet Take 1 tablet (81 mg total) by mouth every morning. 90 tablet 3  . atorvastatin (LIPITOR) 80 MG tablet TAKE 1 TABLET BY MOUTH EVERY NIGHT AT BEDTIME 90 tablet 1  . cloNIDine (CATAPRES) 0.1 MG tablet Take 1 to 2 tabs daily as needed for anxiety for up to 10 days.    . clopidogrel (PLAVIX) 75 MG tablet TAKE 1 TABLET(75 MG) BY MOUTH DAILY 90 tablet 3  . furosemide (LASIX) 20 MG tablet TAKE 1 TABLET BY MOUTH 3 TIMES A WEEK AS NEEDED 12 tablet 11  . gabapentin (NEURONTIN) 400 MG capsule Take 2 capsules (800 mg total) by mouth 2 (two) times daily. 120 capsule 6  . hydrochlorothiazide (HYDRODIURIL) 25 MG tablet Take 1 tablet (25 mg total)  by mouth daily. 90 tablet 3  . ibuprofen (ADVIL) 800 MG tablet Take 1 tablet (800 mg total) by mouth 3 (three) times daily. 21 tablet 0  . losartan (COZAAR) 100 MG tablet TAKE 1 TABLET(100 MG) BY MOUTH DAILY 90 tablet 2  . metFORMIN (GLUCOPHAGE) 500 MG tablet Take 2 tablets (1,000 mg total) by mouth 2 (two) times daily with a meal. 120 tablet 3  . methocarbamol (ROBAXIN) 500 MG tablet Take 1 tablet (500 mg total) by mouth 2 (two) times daily. 20 tablet 0   Social History   Socioeconomic History  . Marital status: Married    Spouse name: Not on file  . Number of children: 2  . Years of education: Not on file  . Highest education level: Not on file  Occupational History  . Occupation:  Disability  Tobacco Use  . Smoking status: Former Smoker    Packs/day: 3.00    Years: 30.00    Pack years: 90.00    Types: Cigarettes, Cigars    Quit date: 01/26/2009    Years since quitting: 10.9  . Smokeless tobacco: Never Used  Vaping Use  . Vaping Use: Never used  Substance and Sexual Activity  . Alcohol use: No  . Drug use: No  . Sexual activity: Not Currently  Other Topics Concern  . Not on file  Social History Narrative  . Not on file   Social Determinants of Health   Financial Resource Strain:   . Difficulty of Paying Living Expenses: Not on file  Food Insecurity:   . Worried About Charity fundraiser in the Last Year: Not on file  . Ran Out of Food in the Last Year: Not on file  Transportation Needs:   . Lack of Transportation (Medical): Not on file  . Lack of Transportation (Non-Medical): Not on file  Physical Activity:   . Days of Exercise per Week: Not on file  . Minutes of Exercise per Session: Not on file  Stress:   . Feeling of Stress : Not on file  Social Connections:   . Frequency of Communication with Friends and Family: Not on file  . Frequency of Social Gatherings with Friends and Family: Not on file  . Attends Religious Services: Not on file  . Active Member of Clubs or Organizations: Not on file  . Attends Archivist Meetings: Not on file  . Marital Status: Not on file  Intimate Partner Violence:   . Fear of Current or Ex-Partner: Not on file  . Emotionally Abused: Not on file  . Physically Abused: Not on file  . Sexually Abused: Not on file   Family History  Problem Relation Age of Onset  . Coronary artery disease Father 7  . Diabetes Father   . Hyperlipidemia Father   . Heart disease Father        before age 39  . Hypertension Father   . Cancer Mother        uterine  . Hyperlipidemia Mother   . Diabetes Mother   . Hypertension Mother   . Hypertension Brother      OBJECTIVE:  There were no vitals filed for this  visit.   General appearance: alert; no distress Chest: CTA heart sounds normal Heart: RRR no murmur, gallop or rub Skin: laceration of right thumb; laceration is a U-shaped 2.5 cm tender to touch.  White purulent discharge with increased pain.  Psychological: alert and cooperative; normal mood and affect   Results  for orders placed or performed in visit on 06/06/19  CBC with Differential/Platelet  Result Value Ref Range   WBC 7.1 3.8 - 10.8 Thousand/uL   RBC 5.75 4.20 - 5.80 Million/uL   Hemoglobin 16.1 13.2 - 17.1 g/dL   HCT 49.0 38 - 50 %   MCV 85.2 80.0 - 100.0 fL   MCH 28.0 27.0 - 33.0 pg   MCHC 32.9 32.0 - 36.0 g/dL   RDW 12.6 11.0 - 15.0 %   Platelets 236 140 - 400 Thousand/uL   MPV 11.8 7.5 - 12.5 fL   Neutro Abs 5,048 1,500 - 7,800 cells/uL   Lymphs Abs 1,243 850 - 3,900 cells/uL   Absolute Monocytes 575 200 - 950 cells/uL   Eosinophils Absolute 178 15 - 500 cells/uL   Basophils Absolute 57 0 - 200 cells/uL   Neutrophils Relative % 71.1 %   Total Lymphocyte 17.5 %   Monocytes Relative 8.1 %   Eosinophils Relative 2.5 %   Basophils Relative 0.8 %  COMPLETE METABOLIC PANEL WITH GFR  Result Value Ref Range   Glucose, Bld 179 (H) 65 - 99 mg/dL   BUN 18 7 - 25 mg/dL   Creat 1.36 (H) 0.70 - 1.33 mg/dL   GFR, Est Non African American 58 (L) > OR = 60 mL/min/1.36m2   GFR, Est African American 67 > OR = 60 mL/min/1.74m2   BUN/Creatinine Ratio 13 6 - 22 (calc)   Sodium 141 135 - 146 mmol/L   Potassium 4.8 3.5 - 5.3 mmol/L   Chloride 102 98 - 110 mmol/L   CO2 27 20 - 32 mmol/L   Calcium 9.7 8.6 - 10.3 mg/dL   Total Protein 7.3 6.1 - 8.1 g/dL   Albumin 4.4 3.6 - 5.1 g/dL   Globulin 2.9 1.9 - 3.7 g/dL (calc)   AG Ratio 1.5 1.0 - 2.5 (calc)   Total Bilirubin 0.8 0.2 - 1.2 mg/dL   Alkaline phosphatase (APISO) 93 35 - 144 U/L   AST 24 10 - 35 U/L   ALT 24 9 - 46 U/L  Lipid panel  Result Value Ref Range   Cholesterol 90 <200 mg/dL   HDL 39 (L) > OR = 40 mg/dL    Triglycerides 93 <150 mg/dL   LDL Cholesterol (Calc) 33 mg/dL (calc)   Total CHOL/HDL Ratio 2.3 <5.0 (calc)   Non-HDL Cholesterol (Calc) 51 <130 mg/dL (calc)  PSA  Result Value Ref Range   PSA 0.4 < OR = 4.0 ng/mL  Hemoglobin A1C w/out eAG  Result Value Ref Range   Hgb A1c MFr Bld 6.8 (H) <5.7 % of total Hgb  TEST AUTHORIZATION  Result Value Ref Range   TEST NAME: HEMOGLOBIN A1c    TEST CODE: [id number]    CLIENT CONTACT: Learta Codding    REPORT ALWAYS MESSAGE SIGNATURE      Labs Reviewed - No data to display  No results found.   ASSESSMENT & PLAN:  1. Laceration of right thumb with foreign body without damage to nail, initial encounter   2. Infected laceration     Meds ordered this encounter  Medications  . cephALEXin (KEFLEX) 500 MG capsule    Sig: Take 1 capsule (500 mg total) by mouth 4 (four) times daily.    Dispense:  20 capsule    Refill:  0   Discharge instructions Suture was removed Clean with warm water and mild soap.  Avoid submerging wound in water. Apply a thin layer of neosporin.  Take OTC ibuprofen or tylenol as needed for pain releif Return sooner or go to the ED if you have any new or worsening symptoms such as increased pain, redness, swelling, drainage, discharge, decreased range of motion of extremity, etc..     Reviewed expectations re: course of current medical issues. Questions answered. Outlined signs and symptoms indicating need for more acute intervention. Patient verbalized understanding. After Visit Summary given.     Note: This document was prepared using Dragon voice recognition software and may include unintentional dictation errors.    Emerson Monte, [place] 12/20/19 [phone]

## 2019-12-21 ENCOUNTER — Other Ambulatory Visit: Payer: Self-pay

## 2019-12-21 ENCOUNTER — Other Ambulatory Visit: Payer: Self-pay | Admitting: Family Medicine

## 2019-12-21 MED ORDER — LOSARTAN POTASSIUM 100 MG PO TABS: ORAL_TABLET | ORAL | 0 refills | Status: DC

## 2020-02-01 DIAGNOSIS — Z79899 Other long term (current) drug therapy: Secondary | ICD-10-CM | POA: Diagnosis not present

## 2020-02-01 DIAGNOSIS — G8929 Other chronic pain: Secondary | ICD-10-CM | POA: Diagnosis not present

## 2020-02-01 DIAGNOSIS — G8921 Chronic pain due to trauma: Secondary | ICD-10-CM | POA: Diagnosis not present

## 2020-02-01 DIAGNOSIS — M25572 Pain in left ankle and joints of left foot: Secondary | ICD-10-CM | POA: Diagnosis not present

## 2020-02-01 DIAGNOSIS — Z5181 Encounter for therapeutic drug level monitoring: Secondary | ICD-10-CM | POA: Diagnosis not present

## 2020-02-01 DIAGNOSIS — M79672 Pain in left foot: Secondary | ICD-10-CM | POA: Diagnosis not present

## 2020-02-11 ENCOUNTER — Other Ambulatory Visit: Payer: Self-pay | Admitting: Cardiovascular Disease

## 2020-02-15 ENCOUNTER — Other Ambulatory Visit: Payer: PPO

## 2020-02-15 ENCOUNTER — Ambulatory Visit (INDEPENDENT_AMBULATORY_CARE_PROVIDER_SITE_OTHER): Payer: PPO

## 2020-02-15 ENCOUNTER — Other Ambulatory Visit: Payer: Self-pay

## 2020-02-15 DIAGNOSIS — I10 Essential (primary) hypertension: Secondary | ICD-10-CM | POA: Diagnosis not present

## 2020-02-15 DIAGNOSIS — I2583 Coronary atherosclerosis due to lipid rich plaque: Secondary | ICD-10-CM | POA: Diagnosis not present

## 2020-02-15 DIAGNOSIS — J449 Chronic obstructive pulmonary disease, unspecified: Secondary | ICD-10-CM

## 2020-02-15 DIAGNOSIS — Z23 Encounter for immunization: Secondary | ICD-10-CM | POA: Diagnosis not present

## 2020-02-15 DIAGNOSIS — N419 Inflammatory disease of prostate, unspecified: Secondary | ICD-10-CM

## 2020-02-15 DIAGNOSIS — I2510 Atherosclerotic heart disease of native coronary artery without angina pectoris: Secondary | ICD-10-CM

## 2020-02-16 ENCOUNTER — Other Ambulatory Visit: Payer: Self-pay

## 2020-02-16 DIAGNOSIS — M79606 Pain in leg, unspecified: Secondary | ICD-10-CM

## 2020-02-16 DIAGNOSIS — I10 Essential (primary) hypertension: Secondary | ICD-10-CM

## 2020-02-16 LAB — CBC WITH DIFFERENTIAL/PLATELET
Absolute Monocytes: 435 cells/uL (ref 200–950)
Basophils Absolute: 60 cells/uL (ref 0–200)
Basophils Relative: 1.2 %
Eosinophils Absolute: 100 cells/uL (ref 15–500)
Eosinophils Relative: 2 %
HCT: 48.4 % (ref 38.5–50.0)
Hemoglobin: 15.7 g/dL (ref 13.2–17.1)
Lymphs Abs: 1040 cells/uL (ref 850–3900)
MCH: 27.6 pg (ref 27.0–33.0)
MCHC: 32.4 g/dL (ref 32.0–36.0)
MCV: 85.2 fL (ref 80.0–100.0)
MPV: 11.6 fL (ref 7.5–12.5)
Monocytes Relative: 8.7 %
Neutro Abs: 3365 cells/uL (ref 1500–7800)
Neutrophils Relative %: 67.3 %
Platelets: 196 10*3/uL (ref 140–400)
RBC: 5.68 10*6/uL (ref 4.20–5.80)
RDW: 12.8 % (ref 11.0–15.0)
Total Lymphocyte: 20.8 %
WBC: 5 10*3/uL (ref 3.8–10.8)

## 2020-02-16 LAB — LIPID PANEL
Cholesterol: 119 mg/dL (ref <200)
HDL: 43 mg/dL (ref >=40)
LDL Cholesterol (Calc): 58 mg/dL (calc)
Non-HDL Cholesterol (Calc): 76 mg/dL (calc) (ref <130)
Total CHOL/HDL Ratio: 2.8 (calc) (ref <5.0)
Triglycerides: 92 mg/dL (ref <150)

## 2020-02-16 LAB — COMPLETE METABOLIC PANEL WITH GFR
AG Ratio: 1.6 (calc) (ref 1.0–2.5)
ALT: 21 U/L (ref 9–46)
AST: 23 U/L (ref 10–35)
Albumin: 4.5 g/dL (ref 3.6–5.1)
Alkaline phosphatase (APISO): 89 U/L (ref 35–144)
BUN/Creatinine Ratio: 10 (calc) (ref 6–22)
BUN: 14 mg/dL (ref 7–25)
CO2: 27 mmol/L (ref 20–32)
Calcium: 9.5 mg/dL (ref 8.6–10.3)
Chloride: 104 mmol/L (ref 98–110)
Creat: 1.34 mg/dL — ABNORMAL HIGH (ref 0.70–1.33)
GFR, Est African American: 68 mL/min/{1.73_m2} (ref >=60)
GFR, Est Non African American: 58 mL/min/{1.73_m2} — ABNORMAL LOW (ref >=60)
Globulin: 2.8 g/dL (calc) (ref 1.9–3.7)
Glucose, Bld: 135 mg/dL — ABNORMAL HIGH (ref 65–99)
Potassium: 4.2 mmol/L (ref 3.5–5.3)
Sodium: 141 mmol/L (ref 135–146)
Total Bilirubin: 0.9 mg/dL (ref 0.2–1.2)
Total Protein: 7.3 g/dL (ref 6.1–8.1)

## 2020-02-16 LAB — HEMOGLOBIN A1C
Hgb A1c MFr Bld: 5.9 % of total Hgb — ABNORMAL HIGH (ref <5.7)
Mean Plasma Glucose: 123 (calc)
eAG (mmol/L): 6.8 (calc)

## 2020-02-16 LAB — PSA: PSA: 0.36 ng/mL (ref <=4.0)

## 2020-02-16 MED ORDER — AMLODIPINE BESYLATE 10 MG PO TABS: ORAL_TABLET | ORAL | 3 refills | Status: DC

## 2020-02-16 MED ORDER — ALBUTEROL SULFATE HFA 108 (90 BASE) MCG/ACT IN AERS: INHALATION_SPRAY | RESPIRATORY_TRACT | 3 refills | Status: DC

## 2020-02-16 MED ORDER — METHOCARBAMOL 500 MG PO TABS: 500.0000 mg | ORAL_TABLET | Freq: Two times a day (BID) | ORAL | 4 refills | Status: DC

## 2020-02-16 MED ORDER — ATORVASTATIN CALCIUM 80 MG PO TABS: 80.0000 mg | ORAL_TABLET | Freq: Every day | ORAL | 3 refills | Status: DC

## 2020-02-16 MED ORDER — FUROSEMIDE 20 MG PO TABS: ORAL_TABLET | ORAL | 11 refills | Status: DC

## 2020-02-16 MED ORDER — HYDROCHLOROTHIAZIDE 25 MG PO TABS: 25.0000 mg | ORAL_TABLET | Freq: Every day | ORAL | 3 refills | Status: DC

## 2020-02-16 MED ORDER — LOSARTAN POTASSIUM 100 MG PO TABS: ORAL_TABLET | ORAL | 0 refills | Status: DC

## 2020-02-16 MED ORDER — GABAPENTIN 400 MG PO CAPS: 800.0000 mg | ORAL_CAPSULE | Freq: Two times a day (BID) | ORAL | 6 refills | Status: DC

## 2020-02-16 MED ORDER — CLOPIDOGREL BISULFATE 75 MG PO TABS: ORAL_TABLET | ORAL | 3 refills | Status: DC

## 2020-02-17 ENCOUNTER — Other Ambulatory Visit: Payer: Self-pay

## 2020-02-17 MED ORDER — PRALUENT 75 MG/ML ~~LOC~~ SOAJ: 75.0000 mg | SUBCUTANEOUS | 3 refills | Status: DC

## 2020-02-22 ENCOUNTER — Telehealth: Payer: Self-pay | Admitting: Physician Assistant

## 2020-02-22 NOTE — Telephone Encounter (Signed)
We need to know which medications pt is requesting to be refilled. Please address thanks

## 2020-02-22 NOTE — Telephone Encounter (Signed)
*  STAT* If patient is at the pharmacy, call can be transferred to refill team.   1. Which medications need to be refilled? (please list name of each medication and dose if known) patient need all medications filled   2. Which pharmacy/location (including street and city if local pharmacy) is medication to be sent to? [facility]  3. Do they need a 30 day or 90 day supply? [place]

## 2020-03-20 ENCOUNTER — Other Ambulatory Visit: Payer: Self-pay | Admitting: Cardiovascular Disease

## 2020-04-15 NOTE — Progress Notes (Signed)
Cardiology Office Note:    Date:  04/16/2020   ID:  BOOKERT GUZZI, DOB 17-Feb-1961, MRN [id number]  PCP:  Susy Frizzle, MD  [facility] Cardiologist:  Mertie Moores, MD  [place] Electrophysiologist:  None   Referring MD: Susy Frizzle, MD   Chief Complaint:  Follow-up (CAD)    Patient Profile:    [name] is a 59 y.o. male with:   Coronary artery disease  ? s/p CABG in 2010 ? s/p Inf MI in 04/2009 >>PCI:  DES to RCA  PAD  ? s/p Iliac stenting ? ABIs 01/2019: normal; no focal stenosis on [place]  Hypertension   Hyperlipidemia   OSA   Prior CV studies: Echocardiogram 06/20/2019 EF 55-60, no RWMA, mod LVH, Gr 1 DD, normal RVSF, mod LAE  ABIs/Arterial US 02/24/2019 Summary:  Right: Atherosclerosis in the common femoral, femoral, popliteal and  tibial arteries with no focal stenosis.  Left: Atherosclerosis in the common femoral, femoral, popliteal and tibial  arteries with no focal stenosis.  ABIs: R 1.12; L 1.11  Echocardiogram 09/14/17 Mild LVH, EF 55-60, Gr 1 DD, MAC, mild LAE  ABIs 07/01/2017 +-------+-----------+-----------+------------+------------+ ABI/TBIToday's ABIToday's TBIPrevious ABIPrevious TBI +-------+-----------+-----------+------------+------------+ Right 1.16 0.86 1.18 1.08  +-------+-----------+-----------+------------+------------+ Left 1.05 0.78 1.15 0.99  +-------+-----------+-----------+------------+------------+  Final Interpretation: Right: Resting right ankle-brachial index is within normal range. No evidence of significant right lower extremity arterial disease. The right toe-brachial index is normal. RT great toe pressure = 142 mmHg. Left: Resting left ankle-brachial index is within normal range. No evidence of significant left lower extremity arterial disease. The left toe-brachial index is normal. LT Great toe pressure = 129 mmHg.  Cardiac  catheterization 06/02/16 LAD mid 62 RI ost 100 RCA prox stent patent; RPDA ost 50 L-LAD patent S-RI patent S-OM3 patent  S-RCA 100 LVSF normal   Carotid US 05/23/13 - Bilateral - 1% to39% ICA stenosis. Vertebral artery flow  is antefgrade.   Myoview 06/22/2012 Low risk stress nuclear study with a small, moderate intensity, reversible inferobasal defect consistent with very mild inferior ischemia.   LV Ejection Fraction: 50%.  History of Present Illness:    Mr. Heiny was last seen in 01/2020.  He returns for f/u.  He is here alone.  Unfortunately, his wife has had 3 strokes this past year.  She is somewhat debilitated from her most recent stroke.  He has been walking 3 to 6 miles a day.  His breathing has improved since starting this.  He has not had chest discomfort, syncope, leg edema.      Past Medical History:  Diagnosis Date  . Acute MI, inferior wall ([place]) 04/2009  . Chest pain   . COPD (chronic obstructive pulmonary disease) ([place])   . Coronary artery disease    STATUS POST CABG 01/2009  . Dyslipidemia   . Hyperlipidemia   . Hypertension   . Orthostatic hypotension   . Peripheral vascular disease ([place])   . Syncope and collapse     Current Medications: Current Meds  Medication Sig  . albuterol (VENTOLIN HFA) 108 (90 Base) MCG/ACT inhaler INHALE 2 PUFFS INTO THE LUNGS EVERY 4 HOURS AS NEEDED FOR WHEEZING OR SHORTNESS OF BREATH  . aspirin EC 81 MG tablet Take 1 tablet (81 mg total) by mouth every morning.  Marland Kitchen atorvastatin (LIPITOR) 80 MG tablet Take 1 tablet (80 mg total) by mouth at bedtime.  . cephALEXin (KEFLEX) 500 MG capsule Take 1 capsule (500 mg total) by mouth 4 (four)  times daily.  . cloNIDine (CATAPRES) 0.1 MG tablet Take 1 to 2 tabs daily as needed for anxiety for up to 10 days.  Marland Kitchen gabapentin (NEURONTIN) 400 MG capsule Take 2 capsules (800 mg total) by mouth 2 (two) times daily.  . hydrochlorothiazide (HYDRODIURIL) 25 MG tablet Take 1 tablet (25 mg total) by  mouth daily. Pt past due for 6 month follow up; please call for appt  . ibuprofen (ADVIL) 800 MG tablet Take 1 tablet (800 mg total) by mouth 3 (three) times daily.  Marland Kitchen losartan (COZAAR) 100 MG tablet TAKE 1 TABLET BY MOUTH DAILY  . methocarbamol (ROBAXIN) 500 MG tablet Take 1 tablet (500 mg total) by mouth 2 (two) times daily.  . [DISCONTINUED] Alirocumab (PRALUENT) 75 MG/ML SOAJ Inject 75 mg into the skin every 14 (fourteen) days.  . [DISCONTINUED] amLODipine (NORVASC) 10 MG tablet TAKE 1 TABLET(10 MG) BY MOUTH DAILY  . [DISCONTINUED] clopidogrel (PLAVIX) 75 MG tablet TAKE 1 TABLET(75 MG) BY MOUTH DAILY  . [DISCONTINUED] furosemide (LASIX) 20 MG tablet TAKE 1 TABLET BY MOUTH 3 TIMES A WEEK AS NEEDED     Allergies:   Crestor [rosuvastatin]   Social History   Tobacco Use  . Smoking status: Former Smoker    Packs/day: 3.00    Years: 30.00    Pack years: 90.00    Types: Cigarettes, Cigars    Quit date: 01/26/2009    Years since quitting: 11.2  . Smokeless tobacco: Never Used  Vaping Use  . Vaping Use: Never used  Substance Use Topics  . Alcohol use: No  . Drug use: No     Family Hx: The patient's family history includes Cancer in his mother; Coronary artery disease (age of onset: 63) in his father; Diabetes in his father and mother; Heart disease in his father; Hyperlipidemia in his father and mother; Hypertension in his brother, father, and mother.  ROS   EKGs/Labs/Other Test Reviewed:    EKG:  EKG is   ordered today.  The ekg ordered today demonstrates sinus bradycardia, HR 56, inferior Q waves, nonspecific ST-T wave changes, QTC 445, no change from prior tracing  Recent Labs: 02/15/2020: ALT 21; BUN 14; Creat 1.34; Hemoglobin 15.7; Platelets 196; Potassium 4.2; Sodium 141   Recent Lipid Panel Lab Results  Component Value Date/Time   CHOL 119 02/15/2020 08:21 AM   CHOL 108 12/17/2018 07:46 AM   TRIG 92 02/15/2020 08:21 AM   HDL 43 02/15/2020 08:21 AM   HDL 36 (L)  12/17/2018 07:46 AM   CHOLHDL 2.8 02/15/2020 08:21 AM   LDLCALC 58 02/15/2020 08:21 AM   LDLDIRECT 117 (H) 02/23/2017 03:49 PM      Risk Assessment/Calculations:      Physical Exam:    VS:  BP 128/84   Pulse (!) 56   Ht _0  (1.753 m)   Wt 254 lb (115.2 kg)   BMI 37.51 kg/m     Wt Readings from Last 3 Encounters:  04/16/20 254 lb (115.2 kg)  12/12/19 250 lb (113.4 kg)  07/24/19 260 lb (117.9 kg)     Constitutional:      Appearance: Healthy appearance. Not in distress.  Neck:     Vascular: No carotid bruit. JVD normal.  Pulmonary:     Effort: Pulmonary effort is normal.     Breath sounds: No wheezing. No rales.  Cardiovascular:     Normal rate. Regular rhythm. Normal S1. Normal S2.     Murmurs: There is no  murmur.  Edema:    Peripheral edema absent.  Abdominal:     Palpations: Abdomen is soft.  Skin:    General: Skin is warm and dry.  Neurological:     General: No focal deficit present.     Mental Status: Alert and oriented to person, place and time.     Cranial Nerves: Cranial nerves are intact.      ASSESSMENT & PLAN:    1. Coronary artery disease involving native coronary artery of native heart without angina pectoris History of CABG in 2010 and subsequent myocardial infarction in 2011 treated with DES to the RCA.  Cardiac catheterization November 2018 demonstrated patent LIMA-LAD, SVG-OM 3 and SVG-RI.  SVG-RCA was occluded but the RCA stent was patent.  He is walking on a regular basis and has not had any anginal symptoms.  He has remained on long-term dual antiplatelet therapy and is tolerating this well.  Continue current dose of Alirocumab, aspirin, atorvastatin, clopidogrel.  Follow-up 1 year.  2. Essential hypertension The patient's blood pressure is controlled on his current regimen.  Continue current medications.   3. Mixed hyperlipidemia LDL optimal on most recent lab work.  Continue current medications.       Dispo:  Return in about 1 year  (around 04/16/2021) for Routine Follow Up, w/ Dr. Acie Fredrickson, or Richardson Dopp, PA-C, in person.   Medication Adjustments/Labs and Tests Ordered: Current medicines are reviewed at length with the patient today.  Concerns regarding medicines are outlined above.  Tests Ordered: Orders Placed This Encounter  Procedures  . EKG 12-Lead   Medication Changes: Meds ordered this encounter  Medications  . Alirocumab (PRALUENT) 75 MG/ML SOAJ    Sig: Inject 75 mg into the skin every 14 (fourteen) days.    Dispense:  6 mL    Refill:  3  . amLODipine (NORVASC) 10 MG tablet    Sig: TAKE 1 TABLET(10 MG) BY MOUTH DAILY    Dispense:  90 tablet    Refill:  3  . clopidogrel (PLAVIX) 75 MG tablet    Sig: TAKE 1 TABLET(75 MG) BY MOUTH DAILY    Dispense:  90 tablet    Refill:  3  . furosemide (LASIX) 20 MG tablet    Sig: TAKE 1 TABLET BY MOUTH 3 TIMES A WEEK AS NEEDED    Dispense:  45 tablet    Refill:  3    Signed, Richardson Dopp, PA-C  04/16/2020 8:57 AM    [facility] [address], [place], [place]  [address] Phone: [phone]; Fax: [phone]

## 2020-04-16 ENCOUNTER — Ambulatory Visit: Payer: PPO | Admitting: Physician Assistant

## 2020-04-16 ENCOUNTER — Other Ambulatory Visit: Payer: Self-pay

## 2020-04-16 ENCOUNTER — Encounter: Payer: Self-pay | Admitting: Physician Assistant

## 2020-04-16 VITALS — BP 128/84 | HR 56 | Ht 69.0 in | Wt 254.0 lb

## 2020-04-16 DIAGNOSIS — E782 Mixed hyperlipidemia: Secondary | ICD-10-CM | POA: Diagnosis not present

## 2020-04-16 DIAGNOSIS — I2510 Atherosclerotic heart disease of native coronary artery without angina pectoris: Secondary | ICD-10-CM | POA: Diagnosis not present

## 2020-04-16 DIAGNOSIS — I10 Essential (primary) hypertension: Secondary | ICD-10-CM | POA: Diagnosis not present

## 2020-04-16 MED ORDER — PRALUENT 75 MG/ML ~~LOC~~ SOAJ: 75.0000 mg | SUBCUTANEOUS | 3 refills | Status: DC

## 2020-04-16 MED ORDER — FUROSEMIDE 20 MG PO TABS
ORAL_TABLET | ORAL | 3 refills | Status: DC
Start: 2020-04-16 — End: 2021-04-25

## 2020-04-16 MED ORDER — CLOPIDOGREL BISULFATE 75 MG PO TABS
ORAL_TABLET | ORAL | 3 refills | Status: DC
Start: 2020-04-16 — End: 2021-04-17

## 2020-04-16 MED ORDER — AMLODIPINE BESYLATE 10 MG PO TABS: ORAL_TABLET | ORAL | 3 refills | Status: DC

## 2020-04-16 NOTE — Patient Instructions (Signed)
Medication Instructions:  Your physician recommends that you continue on your current medications as directed. Please refer to the Current Medication list given to you today.  *If you need a refill on your cardiac medications before your next appointment, please call your pharmacy*  Lab Work: None ordered today  Testing/Procedures: None ordered today  Follow-Up: At [facility], you and your health needs are our priority.  As part of our continuing mission to provide you with exceptional heart care, we have created designated Provider Care Teams.  These Care Teams include your primary Cardiologist (physician) and Advanced Practice Providers (APPs -  Physician Assistants and Nurse Practitioners) who all work together to provide you with the care you need, when you need it.  Your next appointment:   12 month(s)  The format for your next appointment:   In Person  Provider:   You may see Mertie Moores, MD or Richardson Dopp, PA-C

## 2020-04-25 DIAGNOSIS — G8921 Chronic pain due to trauma: Secondary | ICD-10-CM | POA: Diagnosis not present

## 2020-04-25 DIAGNOSIS — M79672 Pain in left foot: Secondary | ICD-10-CM | POA: Diagnosis not present

## 2020-04-25 DIAGNOSIS — M25572 Pain in left ankle and joints of left foot: Secondary | ICD-10-CM | POA: Diagnosis not present

## 2020-04-25 DIAGNOSIS — G8929 Other chronic pain: Secondary | ICD-10-CM | POA: Diagnosis not present

## 2020-05-26 ENCOUNTER — Other Ambulatory Visit: Payer: Self-pay | Admitting: Cardiovascular Disease

## 2020-06-18 ENCOUNTER — Other Ambulatory Visit: Payer: Self-pay | Admitting: Cardiovascular Disease

## 2020-06-18 ENCOUNTER — Other Ambulatory Visit: Payer: Self-pay | Admitting: Family Medicine

## 2020-07-02 ENCOUNTER — Other Ambulatory Visit: Payer: Self-pay

## 2020-07-02 ENCOUNTER — Telehealth (INDEPENDENT_AMBULATORY_CARE_PROVIDER_SITE_OTHER): Payer: PPO | Admitting: Family Medicine

## 2020-07-02 DIAGNOSIS — J310 Chronic rhinitis: Secondary | ICD-10-CM

## 2020-07-02 DIAGNOSIS — J329 Chronic sinusitis, unspecified: Secondary | ICD-10-CM

## 2020-07-02 MED ORDER — AMOXICILLIN 875 MG PO TABS: 875.0000 mg | ORAL_TABLET | Freq: Two times a day (BID) | ORAL | 0 refills | Status: DC

## 2020-07-02 MED ORDER — GUAIFENESIN-CODEINE 100-10 MG/5ML PO SOLN: 5.0000 mL | ORAL | 0 refills | Status: DC | PRN

## 2020-07-02 NOTE — Progress Notes (Signed)
Subjective:    Patient ID: [name], male    DOB: 1960/05/01, 60 y.o.   MRN: [id number]  HPI Patient is being seen as a telephone visit today.  He consents to be seen via telephone.  He is currently at home.  I am currently in my office.  Phone call began at 902.  Phone call concluded at 915.  The patient symptoms began Thursday.  Symptoms started with head congestion runny nose and headache.  He is having a lot of drainage causing sore throat.  He has a cough productive of green sputum.  He denies any fever or chest pain or shortness of breath.  He has had all three doses of his COVID shot.  Most of his symptoms include head congestion and drainage.  Patient is a high risk patient given his history of cardiovascular disease.  However he denies any shortness of breath or pleurisy or hemoptysis Past Medical History:  Diagnosis Date  . Acute MI, inferior wall ([place]) 04/2009  . Chest pain   . COPD (chronic obstructive pulmonary disease) ([place])   . Coronary artery disease    STATUS POST CABG 01/2009  . Dyslipidemia   . Hyperlipidemia   . Hypertension   . Orthostatic hypotension   . Peripheral vascular disease ([place])   . Syncope and collapse    Past Surgical History:  Procedure Laterality Date  . ANKLE SURGERY     x 2  . COLONOSCOPY  April 2013  . CORONARY ANGIOPLASTY    . CORONARY ARTERY BYPASS GRAFT  2010  . FOOT SURGERY    . HERNIA REPAIR  01/23/12   umb hernia  . Kidney stimulator    . LEFT HEART CATH AND CORS/GRAFTS ANGIOGRAPHY N/A 06/02/2016   Procedure: Left Heart Cath and Cors/Grafts Angiography;  Surgeon: Lorretta Harp, MD;  Location: [facility] CV LAB;  Service: Cardiovascular;  Laterality: N/A;  . LEFT HEART CATHETERIZATION WITH CORONARY/GRAFT ANGIOGRAM N/A 07/08/2012   Procedure: LEFT HEART CATHETERIZATION WITH Beatrix Fetters;  Surgeon: Thayer Headings, MD;  Location: [facility] CATH LAB;  Service: Cardiovascular;  Laterality: N/A;  . PR VEIN BYPASS GRAFT,AORTO-FEM-POP     . STERNAL WIRES REMOVAL  07/08/2011   Procedure: STERNAL WIRES REMOVAL;  Surgeon: Grace Isaac, MD;  Location: [facility];  Service: Open Heart Surgery;  Laterality: N/A;  removal of 4th wire   Current Outpatient Medications on File Prior to Visit  Medication Sig Dispense Refill  . albuterol (VENTOLIN HFA) 108 (90 Base) MCG/ACT inhaler INHALE 2 PUFFS INTO THE LUNGS EVERY 4 HOURS AS NEEDED FOR WHEEZING OR SHORTNESS OF BREATH 42.5 g 3  . Alirocumab (PRALUENT) 75 MG/ML SOAJ Inject 75 mg into the skin every 14 (fourteen) days. 6 mL 3  . amLODipine (NORVASC) 10 MG tablet TAKE 1 TABLET(10 MG) BY MOUTH DAILY 90 tablet 3  . aspirin EC 81 MG tablet Take 1 tablet (81 mg total) by mouth every morning. 90 tablet 3  . atorvastatin (LIPITOR) 80 MG tablet TAKE 1 TABLET BY MOUTH EVERY NIGHT AT BEDTIME 90 tablet 3  . cephALEXin (KEFLEX) 500 MG capsule Take 1 capsule (500 mg total) by mouth 4 (four) times daily. 20 capsule 0  . cloNIDine (CATAPRES) 0.1 MG tablet Take 1 to 2 tabs daily as needed for anxiety for up to 10 days.    . clopidogrel (PLAVIX) 75 MG tablet TAKE 1 TABLET(75 MG) BY MOUTH DAILY 90 tablet 3  . furosemide (LASIX) 20 MG tablet  TAKE 1 TABLET BY MOUTH 3 TIMES A WEEK AS NEEDED 45 tablet 3  . gabapentin (NEURONTIN) 400 MG capsule Take 2 capsules (800 mg total) by mouth 2 (two) times daily. 120 capsule 6  . hydrochlorothiazide (HYDRODIURIL) 25 MG tablet Take 1 tablet (25 mg total) by mouth daily. Pt past due for 6 month follow up; please call for appt 90 tablet 3  . ibuprofen (ADVIL) 800 MG tablet Take 1 tablet (800 mg total) by mouth 3 (three) times daily. 21 tablet 0  . losartan (COZAAR) 100 MG tablet TAKE 1 TABLET BY MOUTH DAILY 90 tablet 3  . methocarbamol (ROBAXIN) 500 MG tablet Take 1 tablet (500 mg total) by mouth 2 (two) times daily. 20 tablet 4   No current facility-administered medications on file prior to visit.   Allergies  Allergen Reactions  . Crestor [Rosuvastatin]     Muscle  aches.   Social History   Socioeconomic History  . Marital status: Married    Spouse name: Not on file  . Number of children: 2  . Years of education: Not on file  . Highest education level: Not on file  Occupational History  . Occupation: Disability  Tobacco Use  . Smoking status: Former Smoker    Packs/day: 3.00    Years: 30.00    Pack years: 90.00    Types: Cigarettes, Cigars    Quit date: 01/26/2009    Years since quitting: 11.4  . Smokeless tobacco: Never Used  Vaping Use  . Vaping Use: Never used  Substance and Sexual Activity  . Alcohol use: No  . Drug use: No  . Sexual activity: Not Currently  Other Topics Concern  . Not on file  Social History Narrative  . Not on file   Social Determinants of Health   Financial Resource Strain: Not on file  Food Insecurity: Not on file  Transportation Needs: Not on file  Physical Activity: Not on file  Stress: Not on file  Social Connections: Not on file  Intimate Partner Violence: Not on file      Review of Systems  All other systems reviewed and are negative.      Objective:   Physical Exam        Assessment & Plan:  Rhinosinusitis - Plan: SARS-COV-2 RNA,(COVID-19) QUAL NAAT  Symptoms are consistent with some type of viral upper respiratory infection versus sinus infection.  Most of his symptoms include head congestion and drainage.  I believe he has likely a sinus infection.  Is difficult to say without being able to examine him in person.  I would recommend that he come by so that we can screen him for Covid given the current situation.  Given his past medical history I will treat him aggressively with amoxicillin for possible sinus infection.  He will take 875 mg twice a day for 10 days.  If his Covid test returns positive he will need to stop that medicine.  I will also treat him symptomatically with Robitussin with codeine 100/10 per 5 mL, 1 teaspoon every 4-6 hours as needed for cough.

## 2020-07-03 LAB — SARS-COV-2 RNA,(COVID-19) QUALITATIVE NAAT: SARS CoV2 RNA: NOT DETECTED

## 2020-07-20 DIAGNOSIS — G8921 Chronic pain due to trauma: Secondary | ICD-10-CM | POA: Diagnosis not present

## 2020-07-20 DIAGNOSIS — M79672 Pain in left foot: Secondary | ICD-10-CM | POA: Diagnosis not present

## 2020-07-20 DIAGNOSIS — M25572 Pain in left ankle and joints of left foot: Secondary | ICD-10-CM | POA: Diagnosis not present

## 2020-07-20 DIAGNOSIS — Z79899 Other long term (current) drug therapy: Secondary | ICD-10-CM | POA: Diagnosis not present

## 2020-07-20 DIAGNOSIS — Z5181 Encounter for therapeutic drug level monitoring: Secondary | ICD-10-CM | POA: Diagnosis not present

## 2020-07-20 DIAGNOSIS — G8929 Other chronic pain: Secondary | ICD-10-CM | POA: Diagnosis not present

## 2020-10-11 DIAGNOSIS — M25572 Pain in left ankle and joints of left foot: Secondary | ICD-10-CM | POA: Diagnosis not present

## 2020-10-11 DIAGNOSIS — G8929 Other chronic pain: Secondary | ICD-10-CM | POA: Diagnosis not present

## 2020-10-11 DIAGNOSIS — M79672 Pain in left foot: Secondary | ICD-10-CM | POA: Diagnosis not present

## 2020-10-11 DIAGNOSIS — G8921 Chronic pain due to trauma: Secondary | ICD-10-CM | POA: Diagnosis not present

## 2021-01-03 DIAGNOSIS — G8929 Other chronic pain: Secondary | ICD-10-CM | POA: Diagnosis not present

## 2021-01-03 DIAGNOSIS — G8921 Chronic pain due to trauma: Secondary | ICD-10-CM | POA: Diagnosis not present

## 2021-01-03 DIAGNOSIS — M79672 Pain in left foot: Secondary | ICD-10-CM | POA: Diagnosis not present

## 2021-01-03 DIAGNOSIS — M25572 Pain in left ankle and joints of left foot: Secondary | ICD-10-CM | POA: Diagnosis not present

## 2021-01-03 DIAGNOSIS — G894 Chronic pain syndrome: Secondary | ICD-10-CM | POA: Diagnosis not present

## 2021-01-16 ENCOUNTER — Telehealth: Payer: Self-pay

## 2021-01-16 NOTE — Progress Notes (Signed)
Left message for patient to call back and schedule Medicare Annual Wellness Visit (AWVI)    Due as of 01/27/2015 per [facility]   Please schedule with [facility]-Nurse Health Advisor.       60 Minutes appointment    Any questions, please call me at  Noreene [address], [address], [place], [place] [address] Direct Dial: [phone] [email or web address]

## 2021-04-02 DIAGNOSIS — Z5181 Encounter for therapeutic drug level monitoring: Secondary | ICD-10-CM | POA: Diagnosis not present

## 2021-04-02 DIAGNOSIS — G8921 Chronic pain due to trauma: Secondary | ICD-10-CM | POA: Diagnosis not present

## 2021-04-02 DIAGNOSIS — M25572 Pain in left ankle and joints of left foot: Secondary | ICD-10-CM | POA: Diagnosis not present

## 2021-04-02 DIAGNOSIS — G8929 Other chronic pain: Secondary | ICD-10-CM | POA: Diagnosis not present

## 2021-04-02 DIAGNOSIS — M79672 Pain in left foot: Secondary | ICD-10-CM | POA: Diagnosis not present

## 2021-04-02 DIAGNOSIS — Z79899 Other long term (current) drug therapy: Secondary | ICD-10-CM | POA: Diagnosis not present

## 2021-04-15 ENCOUNTER — Encounter (HOSPITAL_COMMUNITY): Payer: Self-pay | Admitting: *Deleted

## 2021-04-15 ENCOUNTER — Emergency Department (HOSPITAL_COMMUNITY)
Admission: EM | Admit: 2021-04-15 | Discharge: 2021-04-15 | Disposition: A | Discharge status: Home / Self Care | Payer: PPO | Attending: Emergency Medicine | Admitting: Emergency Medicine

## 2021-04-15 ENCOUNTER — Emergency Department (HOSPITAL_COMMUNITY): Payer: PPO

## 2021-04-15 ENCOUNTER — Other Ambulatory Visit: Payer: Self-pay

## 2021-04-15 DIAGNOSIS — J449 Chronic obstructive pulmonary disease, unspecified: Secondary | ICD-10-CM | POA: Diagnosis not present

## 2021-04-15 DIAGNOSIS — Z7982 Long term (current) use of aspirin: Secondary | ICD-10-CM | POA: Diagnosis not present

## 2021-04-15 DIAGNOSIS — M25511 Pain in right shoulder: Secondary | ICD-10-CM | POA: Diagnosis not present

## 2021-04-15 DIAGNOSIS — Z7951 Long term (current) use of inhaled steroids: Secondary | ICD-10-CM | POA: Insufficient documentation

## 2021-04-15 DIAGNOSIS — Z87891 Personal history of nicotine dependence: Secondary | ICD-10-CM | POA: Diagnosis not present

## 2021-04-15 DIAGNOSIS — I10 Essential (primary) hypertension: Secondary | ICD-10-CM | POA: Diagnosis not present

## 2021-04-15 DIAGNOSIS — Z955 Presence of coronary angioplasty implant and graft: Secondary | ICD-10-CM | POA: Diagnosis not present

## 2021-04-15 DIAGNOSIS — Z79899 Other long term (current) drug therapy: Secondary | ICD-10-CM | POA: Diagnosis not present

## 2021-04-15 DIAGNOSIS — I2510 Atherosclerotic heart disease of native coronary artery without angina pectoris: Secondary | ICD-10-CM | POA: Diagnosis not present

## 2021-04-15 DIAGNOSIS — Z951 Presence of aortocoronary bypass graft: Secondary | ICD-10-CM | POA: Insufficient documentation

## 2021-04-15 DIAGNOSIS — M542 Cervicalgia: Secondary | ICD-10-CM | POA: Diagnosis not present

## 2021-04-15 DIAGNOSIS — M47812 Spondylosis without myelopathy or radiculopathy, cervical region: Secondary | ICD-10-CM | POA: Diagnosis not present

## 2021-04-15 LAB — CBC WITH DIFFERENTIAL/PLATELET
Abs Immature Granulocytes: 0.02 10*3/uL (ref 0.00–0.07)
Basophils Absolute: 0 10*3/uL (ref 0.0–0.1)
Basophils Relative: 1 %
Eosinophils Absolute: 0.1 10*3/uL (ref 0.0–0.5)
Eosinophils Relative: 1 %
HCT: 45.3 % (ref 39.0–52.0)
Hemoglobin: 14.8 g/dL (ref 13.0–17.0)
Immature Granulocytes: 0 %
Lymphocytes Relative: 14 %
Lymphs Abs: 0.9 10*3/uL (ref 0.7–4.0)
MCH: 28.1 pg (ref 26.0–34.0)
MCHC: 32.7 g/dL (ref 30.0–36.0)
MCV: 86.1 fL (ref 80.0–100.0)
Monocytes Absolute: 0.5 10*3/uL (ref 0.1–1.0)
Monocytes Relative: 8 %
Neutro Abs: 4.8 10*3/uL (ref 1.7–7.7)
Neutrophils Relative %: 76 %
Platelets: 176 10*3/uL (ref 150–400)
RBC: 5.26 MIL/uL (ref 4.22–5.81)
RDW: 12.6 % (ref 11.5–15.5)
WBC: 6.3 10*3/uL (ref 4.0–10.5)
nRBC: 0 % (ref 0.0–0.2)

## 2021-04-15 LAB — BASIC METABOLIC PANEL
Anion gap: 8 (ref 5–15)
BUN: 17 mg/dL (ref 6–20)
CO2: 26 mmol/L (ref 22–32)
Calcium: 8.7 mg/dL — ABNORMAL LOW (ref 8.9–10.3)
Chloride: 106 mmol/L (ref 98–111)
Creatinine, Ser: 1.1 mg/dL (ref 0.61–1.24)
GFR, Estimated: >60 mL/min (ref >60)
Glucose, Bld: 124 mg/dL — ABNORMAL HIGH (ref 70–99)
Potassium: 4.3 mmol/L (ref 3.5–5.1)
Sodium: 140 mmol/L (ref 135–145)

## 2021-04-15 LAB — TROPONIN I (HIGH SENSITIVITY)
Troponin I (High Sensitivity): 33 ng/L — ABNORMAL HIGH (ref <18)
Troponin I (High Sensitivity): 39 ng/L — ABNORMAL HIGH (ref <18)

## 2021-04-15 MED ORDER — KETOROLAC TROMETHAMINE 30 MG/ML IJ SOLN
30.0000 mg | Freq: Once | INTRAMUSCULAR | Status: AC
  Administered 2021-04-15: 12:00:00 30 mg via INTRAVENOUS
  Filled 2021-04-15: qty 1

## 2021-04-15 MED ORDER — IBUPROFEN 800 MG PO TABS: 800.0000 mg | ORAL_TABLET | Freq: Three times a day (TID) | ORAL | 0 refills | Status: DC

## 2021-04-15 MED ORDER — DEXAMETHASONE SODIUM PHOSPHATE 10 MG/ML IJ SOLN
10.0000 mg | Freq: Once | INTRAMUSCULAR | Status: AC
  Administered 2021-04-15: 14:00:00 10 mg via INTRAVENOUS
  Filled 2021-04-15: qty 1

## 2021-04-15 NOTE — ED Triage Notes (Signed)
Pt c/o right shoulder pain x 1 week. Denies injury. Pt reports the pain is worse with deep breathing, moving his arm and turning his neck. Describes the pain as "pins and needles". Denies any CP or new SOB.

## 2021-04-15 NOTE — Discharge Instructions (Signed)
You have  apply heat on and off to your shoulder.  Take the medication as directed.  Please call your cardiologist to arrange a follow-up appointment.  Return to the emergency department for any new or worsening symptoms

## 2021-04-15 NOTE — ED Provider Notes (Signed)
[facility] EMERGENCY DEPARTMENT Provider Note   CSN: [id number] Arrival date & time: 04/15/21  7322     History Chief Complaint  Patient presents with   Shoulder Pain    [name] is a 60 y.o. male.   Shoulder Pain Associated symptoms: neck pain   Associated symptoms: no back pain and no fever       [name] is a 60 y.o. male with past medical history significant for coronary artery disease, COPD, hypertension, and peripheral vascular disease who presents to the Emergency Department complaining of right shoulder pain x1 week.  He describes a sharp stabbing type pain to the back of the right shoulder that worsens with movement of his right arm and rotation of his neck.  Denies known injury.  States he woke 1 morning with pain to his shoulder that has been present ever since.  He takes Percocet for chronic pain of his foot and states the medication somewhat helps with his shoulder as well.  He also has intermittent pains that radiate from the back of the right shoulder down to the level of his elbow he also describes pain into the right upper chest area as well.  No shortness of breath, headache, dizziness, or visual changes.  He denies any numbness, weakness or tingling of his right hand or fingers.  Past Medical History:  Diagnosis Date   Acute MI, inferior wall ([place]) 04/2009   Chest pain    COPD (chronic obstructive pulmonary disease) (HCC)    Coronary artery disease    STATUS POST CABG 01/2009   Dyslipidemia    Hyperlipidemia    Hypertension    Orthostatic hypotension    Peripheral vascular disease (HCC)    Syncope and collapse     Patient Active Problem List   Diagnosis Date Noted   Mixed hyperlipidemia 02/23/2017   Peripheral vascular disease ([place]) 06/01/2016   Nasolacrimal duct obstruction, acquired, right 03/11/2016   Medication side effects 06/28/2014   COPD (chronic obstructive pulmonary disease) ([place]) 05/24/2013   Chronic, continuous use of  opioids 06/14/2012   Fecal incontinence 02/54/2706   Umbilical hernia 23/76/2831   Increased frequency of urination 12/11/2011   Prostatitis 11/13/2011   Atherosclerosis of native arteries of the extremities with intermittent claudication 10/03/2011   Pain in limb 10/03/2011   Fecal smearing 09/26/2011   Acquired hindfoot varus 08/12/2011   Instability of ankle joint 08/12/2011   Painful sternal wire 06/30/2011   Encounter for therapeutic drug monitoring 04/06/2011   Sural neuritis 04/06/2011   Ankle pain 01/31/2011   Peripheral neuropathy 01/31/2011   Leg cramps 11/12/2010   CAD (coronary artery disease)    Hx of Inferior MI    Essential hypertension    Dyspnea    Orthostatic hypotension    History of syncope     Past Surgical History:  Procedure Laterality Date   ANKLE SURGERY     x 2   COLONOSCOPY  April 2013   CORONARY ANGIOPLASTY     CORONARY ARTERY BYPASS GRAFT  2010   FOOT SURGERY     HERNIA REPAIR  01/23/12   umb hernia   Kidney stimulator     LEFT HEART CATH AND CORS/GRAFTS ANGIOGRAPHY N/A 06/02/2016   Procedure: Left Heart Cath and Cors/Grafts Angiography;  Surgeon: Lorretta Harp, MD;  Location: [place] [facility];  Service: Cardiovascular;  Laterality: N/A;   LEFT HEART CATHETERIZATION WITH CORONARY/GRAFT ANGIOGRAM N/A 07/08/2012   Procedure: LEFT HEART CATHETERIZATION WITH  Beatrix Fetters;  Surgeon: Thayer Headings, MD;  Location: [facility] CATH [facility];  Service: Cardiovascular;  Laterality: N/A;   PR VEIN BYPASS GRAFT,AORTO-FEM-POP     STERNAL WIRES REMOVAL  07/08/2011   Procedure: STERNAL WIRES REMOVAL;  Surgeon: Grace Isaac, MD;  Location: [place];  Service: Open Heart Surgery;  Laterality: N/A;  removal of 4th wire       Family History  Problem Relation Age of Onset   Coronary artery disease Father 63   Diabetes Father    Hyperlipidemia Father    Heart disease Father        before age 75   Hypertension Father    Cancer Mother        uterine    Hyperlipidemia Mother    Diabetes Mother    Hypertension Mother    Hypertension Brother     Social History   Tobacco Use   Smoking status: Former    Packs/day: 3.00    Years: 30.00    Pack years: 90.00    Types: Cigarettes, Cigars    Quit date: 01/26/2009    Years since quitting: 12.2   Smokeless tobacco: Never  Vaping Use   Vaping Use: Never used  Substance Use Topics   Alcohol use: No   Drug use: No    Home Medications Prior to Admission medications   Medication Sig Start Date End Date Taking? Authorizing Provider  albuterol (VENTOLIN HFA) 108 (90 Base) MCG/ACT inhaler INHALE 2 PUFFS INTO THE LUNGS EVERY 4 HOURS AS NEEDED FOR WHEEZING OR SHORTNESS OF BREATH 02/16/20   Susy Frizzle, MD  Alirocumab (PRALUENT) 75 MG/ML SOAJ Inject 75 mg into the skin every 14 (fourteen) days. 04/16/20   Richardson Dopp T, PA-C  amLODipine (NORVASC) 10 MG tablet TAKE 1 TABLET(10 MG) BY MOUTH DAILY 04/16/20   Richardson Dopp T, PA-C  amoxicillin (AMOXIL) 875 MG tablet Take 1 tablet (875 mg total) by mouth 2 (two) times daily. 07/02/20   Susy Frizzle, MD  aspirin EC 81 MG tablet Take 1 tablet (81 mg total) by mouth every morning. 11/07/16   Susy Frizzle, MD  atorvastatin (LIPITOR) 80 MG tablet TAKE 1 TABLET BY MOUTH EVERY NIGHT AT BEDTIME 06/19/20   Susy Frizzle, MD  cephALEXin (KEFLEX) 500 MG capsule Take 1 capsule (500 mg total) by mouth 4 (four) times daily. 12/20/19   Avegno, Darrelyn Hillock, FNP  cloNIDine (CATAPRES) 0.1 MG tablet Take 1 to 2 tabs daily as needed for anxiety for up to 10 days. 11/17/18   [provider]  clopidogrel (PLAVIX) 75 MG tablet TAKE 1 TABLET(75 MG) BY MOUTH DAILY 04/16/20   Richardson Dopp T, PA-C  furosemide (LASIX) 20 MG tablet TAKE 1 TABLET BY MOUTH 3 TIMES A WEEK AS NEEDED 04/16/20   Richardson Dopp T, PA-C  gabapentin (NEURONTIN) 400 MG capsule Take 2 capsules (800 mg total) by mouth 2 (two) times daily. 02/16/20   Susy Frizzle, MD   guaiFENesin-codeine 100-10 MG/5ML syrup Take 5 mLs by mouth every 4 (four) hours as needed for cough. 07/02/20   Susy Frizzle, MD  hydrochlorothiazide (HYDRODIURIL) 25 MG tablet Take 1 tablet (25 mg total) by mouth daily. Pt past due for 6 month follow up; please call for appt 02/16/20   Susy Frizzle, MD  ibuprofen (ADVIL) 800 MG tablet Take 1 tablet (800 mg total) by mouth 3 (three) times daily. 07/24/19   Garald Balding, PA-C  losartan (COZAAR) 100  MG tablet TAKE 1 TABLET BY MOUTH DAILY 06/19/20   Richardson Dopp T, PA-C  methocarbamol (ROBAXIN) 500 MG tablet Take 1 tablet (500 mg total) by mouth 2 (two) times daily. 02/16/20   Susy Frizzle, MD    Allergies    Rosuvastatin  Review of Systems   Review of Systems  Constitutional:  Negative for chills and fever.  HENT:  Negative for sore throat.   Respiratory:  Negative for cough, chest tightness, shortness of breath and wheezing.   Cardiovascular:  Positive for chest pain. Negative for palpitations.  Gastrointestinal:  Negative for abdominal pain, nausea and vomiting.  Genitourinary:  Negative for dysuria and flank pain.  Musculoskeletal:  Positive for arthralgias (right shoulder pain) and neck pain. Negative for back pain, myalgias and neck stiffness.  Skin:  Negative for rash.  Neurological:  Negative for dizziness, facial asymmetry, weakness, numbness and headaches.  Hematological:  Does not bruise/bleed easily.  Psychiatric/Behavioral:  Negative for confusion.   All other systems reviewed and are negative.  Physical Exam Updated Vital Signs BP (!) 166/93 (BP Location: Left Arm)    Pulse (!) 108    Temp (!) 97.5 F (36.4 C) (Oral)    Resp 18    Ht 5\' 9"  (1.753 m)    Wt 113.4 kg    SpO2 100%    BMI 36.92 kg/m   Physical Exam Vitals and nursing note reviewed.  Constitutional:      General: He is not in acute distress.    Appearance: Normal appearance.  HENT:     Head: Atraumatic.  Eyes:     Extraocular Movements:  Extraocular movements intact.     Conjunctiva/sclera: Conjunctivae normal.     Pupils: Pupils are equal, round, and reactive to light.  Neck:     Trachea: Phonation normal.     Comments: Pain with ROM of the neck, specifically with flexion.   Cardiovascular:     Rate and Rhythm: Normal rate and regular rhythm.     Pulses: Normal pulses.  Pulmonary:     Effort: Pulmonary effort is normal. No respiratory distress.     Breath sounds: Normal breath sounds.  Musculoskeletal:        General: Tenderness present. No swelling, deformity or signs of injury. Normal range of motion.     Right shoulder: Tenderness present. No swelling or crepitus. Normal range of motion. Normal strength. Normal pulse.     Cervical back: Tenderness present. Pain with movement and muscular tenderness present. No spinous process tenderness.     Comments: ttp of the posterior right shoulder and along the right scapula.  No bony deformity or decreased ROM.    Lymphadenopathy:     Cervical: No cervical adenopathy.  Skin:    General: Skin is warm.     Capillary Refill: Capillary refill takes less than 2 seconds.     Findings: No erythema or rash.  Neurological:     General: No focal deficit present.     Mental Status: He is alert.     Sensory: No sensory deficit.     Motor: No weakness.    ED Results / Procedures / Treatments   Labs (all labs ordered are listed, but only abnormal results are displayed) Labs Reviewed  BASIC METABOLIC PANEL - Abnormal; Notable for the following components:      Result Value   Glucose, Bld 124 (*)    Calcium 8.7 (*)    All other components within normal limits  TROPONIN I (HIGH SENSITIVITY) - Abnormal; Notable for the following components:   Troponin I (High Sensitivity) 33 (*)    All other components within normal limits  TROPONIN I (HIGH SENSITIVITY) - Abnormal; Notable for the following components:   Troponin I (High Sensitivity) 39 (*)    All other components within normal  limits  CBC WITH DIFFERENTIAL/PLATELET    EKG EKG Interpretation  Date/Time:  Monday April 15 2021 10:19:55 EST Ventricular Rate:  52 PR Interval:  141 QRS Duration: 102 QT Interval:  461 QTC Calculation: 429 R Axis:   61 Text Interpretation: Sinus rhythm Ventricular trigeminy Low voltage, precordial leads Abnormal T, consider ischemia, diffuse leads Confirmed by Octaviano Glow [phone]) on 04/15/2021 10:52:41 AM  Radiology DG Cervical Spine Complete  Result Date: 04/15/2021 CLINICAL DATA:  Right shoulder pain EXAM: CERVICAL SPINE - COMPLETE 4+ VIEW COMPARISON:  None. FINDINGS: No acute fracture or subluxation identified. Mild-to-moderate bilateral neural foraminal narrowing at C3-C4, C4-C5 and C5-C6. Intervertebral disc spaces are preserved. No prevertebral soft tissue swelling visualized. IMPRESSION: Degenerative changes with no acute osseous abnormality identified. Electronically Signed   By: Ofilia Neas M.D.   On: 04/15/2021 11:15   DG Shoulder Right  Result Date: 04/15/2021 CLINICAL DATA:  Right shoulder pain for 1 week.  No injury. EXAM: RIGHT SHOULDER - 2+ VIEW COMPARISON:  None. FINDINGS: There is no evidence of fracture or dislocation. Glenohumeral joint space narrowing with glenoid spurring. Soft tissues are unremarkable. IMPRESSION: 1.  No acute fracture or dislocation. 2.  Mild glenohumeral osteoarthritis. Electronically Signed   By: Keane Police D.O.   On: 04/15/2021 11:13    Procedures Procedures   Medications Ordered in ED Medications - No data to display  ED Course  I have reviewed the triage vital signs and the nursing notes.  Pertinent labs & imaging results that were available during my care of the patient were reviewed by me and considered in my medical decision making (see chart for details).  Clinical Course as of 04/15/21 1354  Mon Apr 15, 2021  1346 This is a 59 year old male with a history of hypertension, high cholesterol, obesity, prior MI,  presented emergency department with right shoulder pain.  He reports gradual onset of symptoms about 7 days ago, no inciting event.  He describes a soreness in his right shoulder that radiates down towards his right upper arm.  It is significantly worse with arm movement and overhead arm raise.  It has been constant for 7 days.  It improved somewhat with Tylenol.  He denies any lightheadedness, diaphoresis, nausea or vomiting.  He said he had similar symptoms 10 years ago when he had his MI, although he had additional symptoms at that time but she is lacking now, including vomiting and lightheadedness and diaphoresis.  On exam patient is mildly hypertensive, afebrile.  He is focally reproducible tenderness of the right suprascapular region, also significant reproducible pain with right overhead arm raise, with pain in shoulder.  He also has pain with spurling maneuver, and buffalo hump/kyphosis noted on exam.  Based on this physical exam and presentation, I am strongly suspicious that this is musculoskeletal pain, possible cervical impingement and referred cervical radiculopathy.  We given some Toradol and his pain significantly improved with that.  I will prescribe ibuprofen for a few days.  His troponins were unremarkable, flat, and his EKG also showed a sinus rhythm without acute ischemic findings.  He does have a cardiologist, and I stressed with him the importance  of outpatient follow-up in the office as soon as possible, and explained to him that a negative work-up in the ER does not rule out all coronary disease.  However I do think it is reasonable since his symptoms are controlled to discharge him home at this time.  Close return precautions were discussed.  He verbalized understanding. [MT]    Clinical Course User Index [MT] Trifan, Carola Rhine, MD   MDM Rules/Calculators/A&P                          Patient is a 60 year old male with prior history of cardiac disease with CABG here for evaluation of  right shoulder pain x1 week.  No known injury.  Pain radiates from the back of right shoulder to level of the elbow.  Pain reproduced with movement of the neck and shoulder.  Some tenderness of the right upper chest as well.  No dyspnea.  No reported skin changes  On exam, patient well-appearing, nontoxic.  Pain is reproduced with flexion of the neck and abduction of the right arm.  Some decreased muscle strength on right versus left.  No focal neurodeficits on exam.  Clinically I suspect musculoskeletal pain, but given patient's history of coronary artery disease will obtain EKG troponin and baseline labs  On recheck, patient resting comfortably no increased pain.  We will try IV Toradol and reassess.  EKG without acute ischemic change.  X-rays of the C-spine and shoulder showed degenerative changes and spurring at the glenohumeral joint.  Labs without leukocytosis or electrolyte derangement.  Troponins are flat. Symptoms could also represent ACS or infectious process, but felt to be less likely given that shoulder symptoms cannot be reproduced on exam and with movement.  He has no associated symptoms such as jaw pain, diaphoresis or nausea vomiting.  He does have a cardiologist.  I feel that he is appropriate for discharge charge home at this time, he is agreeable to close follow-up with his cardiologist this week.  We will also treat with NSAID for several days and follow-up information provided for orthopedics as well.  He is agreeable to plan, care plan seen and discussed with ED provider, Dr. Langston Masker.  Patient was also given strict ER return precautions.      Final Clinical Impression(s) / ED Diagnoses Final diagnoses:  Acute pain of right shoulder    Rx / DC Orders ED Discharge Orders     None        Kem Parkinson, PA-C 04/17/21 0902    Wyvonnia Dusky, MD 04/17/21 1029

## 2021-04-17 ENCOUNTER — Other Ambulatory Visit: Payer: Self-pay | Admitting: Physician Assistant

## 2021-04-25 ENCOUNTER — Other Ambulatory Visit: Payer: Self-pay | Admitting: Physician Assistant

## 2021-04-26 ENCOUNTER — Other Ambulatory Visit: Payer: Self-pay | Admitting: Physician Assistant

## 2021-04-30 ENCOUNTER — Other Ambulatory Visit: Payer: Self-pay | Admitting: Pharmacist

## 2021-05-01 ENCOUNTER — Telehealth: Payer: Self-pay | Admitting: Cardiovascular Disease

## 2021-05-01 NOTE — Telephone Encounter (Signed)
Pt has been on Praluent since 2018. No contraindications to Repatha noted, ok to change.

## 2021-05-01 NOTE — Telephone Encounter (Signed)
Repatha pa submitted [name] (Key: [id number]). Once approved I will contact the pt and send rx Copay card:  RxBin: [id number] [place]: CN RxGrp: [id number] ID: [id number]

## 2021-05-01 NOTE — Telephone Encounter (Signed)
*  STAT* If patient is at the pharmacy, call can be transferred to refill team.   1. Which medications need to be refilled? (please list name of each medication and dose if known)  Repatha   2. Which pharmacy/location (including street and city if local pharmacy) is medication to be sent to? [place], [place]  3. Do they need a 30 day or 90 day supply?   90 day supply  Per Judson Roch, with [employer], patient's new insurance prefers to cover Repatha and she is requesting to have Praluent Rx cancelled if possible.

## 2021-05-02 ENCOUNTER — Ambulatory Visit (INDEPENDENT_AMBULATORY_CARE_PROVIDER_SITE_OTHER): Payer: Medicare HMO

## 2021-05-02 ENCOUNTER — Telehealth: Payer: Self-pay

## 2021-05-02 ENCOUNTER — Other Ambulatory Visit: Payer: Self-pay

## 2021-05-02 VITALS — Ht 69.0 in | Wt 254.0 lb

## 2021-05-02 DIAGNOSIS — Z638 Other specified problems related to primary support group: Secondary | ICD-10-CM

## 2021-05-02 DIAGNOSIS — Z0000 Encounter for general adult medical examination without abnormal findings: Secondary | ICD-10-CM

## 2021-05-02 DIAGNOSIS — Z860100 Personal history of colon polyps, unspecified: Secondary | ICD-10-CM | POA: Insufficient documentation

## 2021-05-02 DIAGNOSIS — R319 Hematuria, unspecified: Secondary | ICD-10-CM | POA: Insufficient documentation

## 2021-05-02 DIAGNOSIS — K6289 Other specified diseases of anus and rectum: Secondary | ICD-10-CM | POA: Insufficient documentation

## 2021-05-02 DIAGNOSIS — E782 Mixed hyperlipidemia: Secondary | ICD-10-CM

## 2021-05-02 DIAGNOSIS — K219 Gastro-esophageal reflux disease without esophagitis: Secondary | ICD-10-CM | POA: Insufficient documentation

## 2021-05-02 DIAGNOSIS — K5730 Diverticulosis of large intestine without perforation or abscess without bleeding: Secondary | ICD-10-CM | POA: Insufficient documentation

## 2021-05-02 MED ORDER — REPATHA SURECLICK 140 MG/ML ~~LOC~~ SOAJ: 140.0000 mg | SUBCUTANEOUS | 3 refills | Status: DC

## 2021-05-02 NOTE — Addendum Note (Signed)
Addended by: Randal Buba K on: 05/02/2021 10:26 AM   Modules accepted: Orders

## 2021-05-02 NOTE — Patient Instructions (Signed)
[name] , Thank you for taking time to come for your Medicare Wellness Visit. I appreciate your ongoing commitment to your health goals. Please review the following plan we discussed and let me know if I can assist you in the future.   Screening recommendations/referrals: Colonoscopy: Done 11/07/2016. Repeat every 7 years.  Recommended yearly ophthalmology/optometry visit for glaucoma screening and checkup Recommended yearly dental visit for hygiene and checkup  Vaccinations: Influenza vaccine: Done 01/26/2021 Repeat annually  Pneumococcal vaccine: Done 11/07/2016 and 06/06/2019 Tdap vaccine: Done 12/12/2019 Repeat in 10 years  Shingles vaccine: Shingrix discussed. Please contact your pharmacy for coverage information.     Covid-19: Please bring copy of card by office for documentation, at your convenience.   Advanced directives: Advance directive discussed with you today. Even though you declined this today, please call our office should you change your mind, and we can give you the proper paperwork for you to fill out.   Conditions/risks identified: Aim for 30 minutes of exercise or brisk walking each day, drink 6-8 glasses of water and eat lots of fruits and vegetables.   Next appointment: Follow up in one year for your annual wellness visit 2024.  Preventive Care 40-64 Years, Male Preventive care refers to lifestyle choices and visits with your health care provider that can promote health and wellness. What does preventive care include? A yearly physical exam. This is also called an annual well check. Dental exams once or twice a year. Routine eye exams. Ask your health care provider how often you should have your eyes checked. Personal lifestyle choices, including: Daily care of your teeth and gums. Regular physical activity. Eating a healthy diet. Avoiding tobacco and drug use. Limiting alcohol use. Practicing safe sex. Taking low-dose aspirin every day starting at age  59. What happens during an annual well check? The services and screenings done by your health care provider during your annual well check will depend on your age, overall health, lifestyle risk factors, and family history of disease. Counseling  Your health care provider may ask you questions about your: Alcohol use. Tobacco use. Drug use. Emotional well-being. Home and relationship well-being. Sexual activity. Eating habits. Work and work Statistician. Screening  You may have the following tests or measurements: Height, weight, and BMI. Blood pressure. Lipid and cholesterol levels. These may be checked every 5 years, or more frequently if you are over 63 years old. Skin check. Lung cancer screening. You may have this screening every year starting at age 17 if you have a 30-pack-year history of smoking and currently smoke or have quit within the past 15 years. Fecal occult blood test (FOBT) of the stool. You may have this test every year starting at age 17. Flexible sigmoidoscopy or colonoscopy. You may have a sigmoidoscopy every 5 years or a colonoscopy every 10 years starting at age 70. Prostate cancer screening. Recommendations will vary depending on your family history and other risks. Hepatitis C blood test. Hepatitis B blood test. Sexually transmitted disease (STD) testing. Diabetes screening. This is done by checking your blood sugar (glucose) after you have not eaten for a while (fasting). You may have this done every 1-3 years. Discuss your test results, treatment options, and if necessary, the need for more tests with your health care provider. Vaccines  Your health care provider may recommend certain vaccines, such as: Influenza vaccine. This is recommended every year. Tetanus, diphtheria, and acellular pertussis (Tdap, Td) vaccine. You may need a Td booster every 10 years.  Zoster vaccine. You may need this after age 18. Pneumococcal 13-valent conjugate (PCV13) vaccine. You  may need this if you have certain conditions and have not been vaccinated. Pneumococcal polysaccharide (PPSV23) vaccine. You may need one or two doses if you smoke cigarettes or if you have certain conditions. Talk to your health care provider about which screenings and vaccines you need and how often you need them. This information is not intended to replace advice given to you by your health care provider. Make sure you discuss any questions you have with your health care provider. Document Released: 05/11/2015 Document Revised: 01/02/2016 Document Reviewed: 02/13/2015 Elsevier Interactive Patient Education  2017 [place] Prevention in the Home Falls can cause injuries. They can happen to people of all ages. There are many things you can do to make your home safe and to help prevent falls. What can I do on the outside of my home? Regularly fix the edges of walkways and driveways and fix any cracks. Remove anything that might make you trip as you walk through a door, such as a raised step or threshold. Trim any bushes or trees on the path to your home. Use bright outdoor lighting. Clear any walking paths of anything that might make someone trip, such as rocks or tools. Regularly check to see if handrails are loose or broken. Make sure that both sides of any steps have handrails. Any raised decks and porches should have guardrails on the edges. Have any leaves, snow, or ice cleared regularly. Use sand or salt on walking paths during winter. Clean up any spills in your garage right away. This includes oil or grease spills. What can I do in the bathroom? Use night lights. Install grab bars by the toilet and in the tub and shower. Do not use towel bars as grab bars. Use non-skid mats or decals in the tub or shower. If you need to sit down in the shower, use a plastic, non-slip stool. Keep the floor dry. Clean up any water that spills on the floor as soon as it happens. Remove soap  buildup in the tub or shower regularly. Attach bath mats securely with double-sided non-slip rug tape. Do not have throw rugs and other things on the floor that can make you trip. What can I do in the bedroom? Use night lights. Make sure that you have a light by your bed that is easy to reach. Do not use any sheets or blankets that are too big for your bed. They should not hang down onto the floor. Have a firm chair that has side arms. You can use this for support while you get dressed. Do not have throw rugs and other things on the floor that can make you trip. What can I do in the kitchen? Clean up any spills right away. Avoid walking on wet floors. Keep items that you use a lot in easy-to-reach places. If you need to reach something above you, use a strong step stool that has a grab bar. Keep electrical cords out of the way. Do not use floor polish or wax that makes floors slippery. If you must use wax, use non-skid floor wax. Do not have throw rugs and other things on the floor that can make you trip. What can I do with my stairs? Do not leave any items on the stairs. Make sure that there are handrails on both sides of the stairs and use them. Fix handrails that are broken or loose.  Make sure that handrails are as long as the stairways. Check any carpeting to make sure that it is firmly attached to the stairs. Fix any carpet that is loose or worn. Avoid having throw rugs at the top or bottom of the stairs. If you do have throw rugs, attach them to the floor with carpet tape. Make sure that you have a light switch at the top of the stairs and the bottom of the stairs. If you do not have them, ask someone to add them for you. What else can I do to help prevent falls? Wear shoes that: Do not have high heels. Have rubber bottoms. Are comfortable and fit you well. Are closed at the toe. Do not wear sandals. If you use a stepladder: Make sure that it is fully opened. Do not climb a closed  stepladder. Make sure that both sides of the stepladder are locked into place. Ask someone to hold it for you, if possible. Clearly mark and make sure that you can see: Any grab bars or handrails. First and last steps. Where the edge of each step is. Use tools that help you move around (mobility aids) if they are needed. These include: Canes. Walkers. Scooters. Crutches. Turn on the lights when you go into a dark area. Replace any light bulbs as soon as they burn out. Set up your furniture so you have a clear path. Avoid moving your furniture around. If any of your floors are uneven, fix them. If there are any pets around you, be aware of where they are. Review your medicines with your doctor. Some medicines can make you feel dizzy. This can increase your chance of falling. Ask your doctor what other things that you can do to help prevent falls. This information is not intended to replace advice given to you by your health care provider. Make sure you discuss any questions you have with your health care provider. Document Released: 02/08/2009 Document Revised: 09/20/2015 Document Reviewed: 05/19/2014 Elsevier Interactive Patient Education  2017 [employer].

## 2021-05-02 NOTE — Telephone Encounter (Signed)
Called and spoke w/pt regarding the approval of the repatha, rxsent, labs ordered/scheduled, healthwell approval provided to pt. Pt voiced understanding.  CARD NO. [id number]   CARD STATUS Active   BIN [id number]   PCN [id number]   PC GROUP [id number]

## 2021-05-02 NOTE — Progress Notes (Addendum)
Subjective:   [name] is a 61 y.o. male who presents for an Initial Medicare Annual Wellness Visit. Virtual Visit via Telephone Note  I connected with  [name] on 05/02/21 at  9:00 AM EST by telephone and verified that I am speaking with the correct person using two identifiers.  Location: Patient: Home Provider: [facility] Persons participating in the virtual visit: patient/Nurse Health Advisor   I discussed the limitations, risks, security and privacy concerns of performing an evaluation and management service by telephone and the availability of in person appointments. The patient expressed understanding and agreed to proceed.  Interactive audio and video telecommunications were attempted between this nurse and patient, however failed, due to patient having technical difficulties OR patient did not have access to video capability.  We continued and completed visit with audio only.  Some vital signs may be absent or patient reported.   [name], LPN  Review of Systems     Cardiac Risk Factors include: advanced age (>66men, >58 women);hypertension;male gender;sedentary lifestyle;obesity (BMI >30kg/m2)   PHONE VISIT. PT AT HOME. NURSE AT [facility]. Objective:    Today's Vitals   05/02/21 0904 05/02/21 0906  Weight: 254 lb (115.2 kg)   Height: 5\' 9"  (1.753 m)   PainSc:  10-Worst pain ever   Body mass index is 37.51 kg/m.  Advanced Directives 05/02/2021 04/15/2021 12/12/2019 07/24/2019 10/06/2018 08/06/2017 07/12/2017  Does Patient Have a Medical Advance Directive? No No No No No No No  Would patient like information on creating a medical advance directive? No - Patient declined No - Patient declined - - - No - Patient declined No - Patient declined  Pre-existing out of facility DNR order (yellow form or pink MOST form) - - - - - - -    Current Medications (verified) Outpatient Encounter Medications as of 05/02/2021  Medication Sig   albuterol (VENTOLIN HFA) 108  (90 Base) MCG/ACT inhaler INHALE 2 PUFFS INTO THE LUNGS EVERY 4 HOURS AS NEEDED FOR WHEEZING OR SHORTNESS OF BREATH   amLODipine (NORVASC) 10 MG tablet TAKE 1 TABLET(10 MG) BY MOUTH DAILY   amoxicillin (AMOXIL) 875 MG tablet Take 1 tablet (875 mg total) by mouth 2 (two) times daily.   aspirin EC 81 MG tablet Take 1 tablet (81 mg total) by mouth every morning.   atorvastatin (LIPITOR) 80 MG tablet TAKE 1 TABLET BY MOUTH EVERY NIGHT AT BEDTIME   cephALEXin (KEFLEX) 500 MG capsule Take 1 capsule (500 mg total) by mouth 4 (four) times daily.   cloNIDine (CATAPRES) 0.1 MG tablet    clopidogrel (PLAVIX) 75 MG tablet TAKE 1 TABLET(75 MG) BY MOUTH DAILY   furosemide (LASIX) 20 MG tablet TAKE 1 TABLET BY MOUTH THREE TIMES A WEEK AS NEEDED   gabapentin (NEURONTIN) 800 MG tablet Take 800 mg by mouth 3 (three) times daily.   guaiFENesin-codeine 100-10 MG/5ML syrup Take 5 mLs by mouth every 4 (four) hours as needed for cough.   hydrochlorothiazide (HYDRODIURIL) 25 MG tablet Take 1 tablet (25 mg total) by mouth daily. Pt past due for 6 month follow up; please call for appt   ibuprofen (ADVIL) 800 MG tablet Take 1 tablet (800 mg total) by mouth 3 (three) times daily. Take with food   losartan (COZAAR) 100 MG tablet TAKE 1 TABLET BY MOUTH DAILY   methocarbamol (ROBAXIN) 500 MG tablet Take 1 tablet (500 mg total) by mouth 2 (two) times daily.   omeprazole (PRILOSEC) 40 MG capsule  Take 1 capsule by mouth daily.   oxyCODONE-acetaminophen (PERCOCET) 10-325 MG tablet Take 1 tablet by mouth every 6 (six) hours as needed.   OXYCONTIN 20 MG 12 hr tablet Take 20 mg by mouth 2 (two) times daily.   PRALUENT 75 MG/ML SOAJ INJECT 75 MG INTO THE SKIN EVERY 14 DAYS   No facility-administered encounter medications on file as of 05/02/2021.    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   Chest pain    COPD (chronic obstructive pulmonary  disease) (HCC)    Coronary artery disease    STATUS POST CABG 01/2009   Dyslipidemia    Hyperlipidemia    Hypertension    Orthostatic hypotension    Peripheral vascular disease (HCC)    Syncope and collapse    Past Surgical History:  Procedure Laterality Date   ANKLE SURGERY     x 2   COLONOSCOPY  April 2013   CORONARY ANGIOPLASTY     CORONARY ARTERY BYPASS GRAFT  2010   FOOT SURGERY     HERNIA REPAIR  01/23/12   umb hernia   Kidney stimulator     LEFT HEART CATH AND CORS/GRAFTS ANGIOGRAPHY N/A 06/02/2016   Procedure: Left Heart Cath and Cors/Grafts Angiography;  Surgeon: Lorretta Harp, MD;  Location: [facility];  Service: Cardiovascular;  Laterality: N/A;   LEFT HEART CATHETERIZATION WITH CORONARY/GRAFT ANGIOGRAM N/A 07/08/2012   Procedure: LEFT HEART CATHETERIZATION WITH Beatrix Fetters;  Surgeon: Thayer Headings, MD;  Location: [facility];  Service: Cardiovascular;  Laterality: N/A;   PR VEIN BYPASS GRAFT,AORTO-FEM-POP     STERNAL WIRES REMOVAL  07/08/2011   Procedure: STERNAL WIRES REMOVAL;  Surgeon: Grace Isaac, MD;  Location: [facility];  Service: Open Heart Surgery;  Laterality: N/A;  removal of 4th wire   Family History  Problem Relation Age of Onset   Coronary artery disease Father 10   Diabetes Father    Hyperlipidemia Father    Heart disease Father        before age 2   Hypertension Father    Cancer Mother        uterine   Hyperlipidemia Mother    Diabetes Mother    Hypertension Mother    Hypertension Brother    Social History   Socioeconomic History   Marital status: Married    Spouse name: [name]   Number of children: 2   Years of education: Not on file   Highest education level: Not on file  Occupational History   Occupation: Disability  Tobacco Use   Smoking status: Former    Packs/day: 3.00    Years: 30.00    Pack years: 90.00    Types: Cigarettes, Cigars    Quit date: 01/26/2009    Years since quitting: 12.2   Smokeless  tobacco: Never  Vaping Use   Vaping Use: Never used  Substance and Sexual Activity   Alcohol use: No   Drug use: No   Sexual activity: Not Currently  Other Topics Concern   Not on file  Social History Narrative   Married x 37 years.   Social Determinants of Health   Financial Resource Strain: Not on file  Food Insecurity: No Food Insecurity   Worried About Charity fundraiser in the Last Year: Never true   Ran Out of Food in the Last Year: Never true  Transportation Needs: No  Transportation Needs   Lack of Transportation (Medical): No   Lack of Transportation (Non-Medical): No  Physical Activity: Insufficiently Active   Days of Exercise per Week: 5 days   Minutes of Exercise per Session: 20 min  Stress: No Stress Concern Present   Feeling of Stress : Not at all  Social Connections: Moderately Isolated   Frequency of Communication with Friends and Family: More than three times a week   Frequency of Social Gatherings with Friends and Family: More than three times a week   Attends Religious Services: Never   Marine scientist or Organizations: No   Attends Music therapist: Never   Marital Status: Married    Tobacco Counseling Counseling given: Not Answered   Clinical Intake:  Pre-visit preparation completed: Yes  Pain : 0-10 Pain Score: 10-Worst pain ever Pain Type: Acute pain (Pt fell last week and injured shoulder.) Pain Location: Shoulder Pain Descriptors / Indicators: Aching Pain Onset: In the past 7 days Pain Frequency: Intermittent     BMI - recorded: 37.51 Nutritional Status: BMI > 30  Obese Nutritional Risks: None Diabetes: No  How often do you need to have someone help you when you read instructions, pamphlets, or other written materials from your doctor or pharmacy?: 1 - Never  Diabetic?No  Interpreter Needed?: No  Information entered by :: MJ Maribelle Hopple, LPN   Activities of Daily Living In your present state of health, do you  have any difficulty performing the following activities: 05/02/2021  Hearing? N  Vision? N  Difficulty concentrating or making decisions? N  Walking or climbing stairs? N  Dressing or bathing? N  Doing errands, shopping? N  Preparing Food and eating ? N  Using the Toilet? N  In the past six months, have you accidently leaked urine? N  Do you have problems with loss of bowel control? Y  Comment Does have issues with leakage of stool.  Managing your Medications? N  Managing your Finances? N  Housekeeping or managing your Housekeeping? N  Some recent data might be hidden    Patient Care Team: Susy Frizzle, MD as PCP - General (Family Medicine) Nahser, Wonda Cheng, MD as PCP - Cardiology (Cardiology) Grace Isaac, MD (Inactive) as Consulting Physician (Cardiothoracic Surgery)  Indicate any recent Medical Services you may have received from other than [facility] providers in the past year (date may be approximate).     Assessment:   This is a routine wellness examination for Arel.  Hearing/Vision screen Hearing Screening - Comments:: No hearing issues. Vision Screening - Comments:: No glasses. 2021. [facility] in [place].  Dietary issues and exercise activities discussed: Current Exercise Habits: Home exercise routine, Type of exercise: walking, Time (Minutes): 20, Frequency (Times/Week): 5, Weekly Exercise (Minutes/Week): 100, Intensity: Mild, Exercise limited by: cardiac condition(s);orthopedic condition(s)   Goals Addressed             This Visit's Progress    Exercise 3x per week (30 min per time)       Pt states he would like to exercise more.        Depression Screen PHQ 2/9 Scores 05/02/2021 06/06/2019  PHQ - 2 Score 0 0    Fall Risk Fall Risk  05/02/2021  Falls in the past year? 0  Number falls in past yr: 0  Injury with Fall? 0  Risk for fall due to : No Fall Risks  Follow up Falls prevention discussed    FALL RISK  PREVENTION PERTAINING TO THE  HOME:  Any stairs in or around the home? Yes  If so, are there any without handrails? No  Home free of loose throw rugs in walkways, pet beds, electrical cords, etc? Yes  Adequate lighting in your home to reduce risk of falls? Yes   ASSISTIVE DEVICES UTILIZED TO PREVENT FALLS:  Life alert? No  Use of a cane, walker or w/c? Yes  Grab bars in the bathroom? Yes  Shower chair or bench in shower? Yes  Elevated toilet seat or a handicapped toilet? Yes   TIMED UP AND GO:  Was the test performed? No .  Phone visit.  Cognitive Function:     6CIT Screen 05/02/2021  What Year? 0 points  What month? 0 points  What time? 0 points  Count back from 20 0 points  Months in reverse 4 points  Repeat phrase 0 points  Total Score 4    Immunizations Immunization History  Administered Date(s) Administered   Influenza Split 12/30/2010   Influenza,inj,Quad PF,6+ Mos 01/13/2013, 05/23/2013, 01/12/2014, 04/27/2015, 01/08/2016, 06/02/2016, 01/06/2017, 01/26/2018, 01/26/2019, 02/15/2020   Pneumococcal Conjugate-13 11/07/2016   Pneumococcal Polysaccharide-23 05/23/2013, 06/06/2019   Td 10/04/2010   Tdap 10/04/2010, 12/12/2019    TDAP status: Up to date  Flu Vaccine status: Up to date  Pneumococcal vaccine status: Up to date  Covid-19 vaccine status: Completed vaccines  Qualifies for Shingles Vaccine? Yes   Zostavax completed No   Shingrix Completed?: No.    Education has been provided regarding the importance of this vaccine. Patient has been advised to call insurance company to determine out of pocket expense if they have not yet received this vaccine. Advised may also receive vaccine at local pharmacy or Health Dept. Verbalized acceptance and understanding.  Screening Tests Health Maintenance  Topic Date Due   COVID-19 Vaccine (1) Never done   Zoster Vaccines- Shingrix (1 of 2) Never done   INFLUENZA VACCINE  11/26/2020   COLONOSCOPY (Pts 45-38yrs Insurance coverage will need to be  confirmed)  11/08/2026   Pneumococcal Vaccine 100-76 Years old (4 - PPSV23 if available, else PCV20) 08/02/2027   TETANUS/TDAP  12/11/2029   Hepatitis C Screening  Completed   HIV Screening  Completed   HPV VACCINES  Aged Out    Health Maintenance  Health Maintenance Due  Topic Date Due   COVID-19 Vaccine (1) Never done   Zoster Vaccines- Shingrix (1 of 2) Never done   INFLUENZA VACCINE  11/26/2020    Colorectal cancer screening: Type of screening: Colonoscopy. Completed 11/07/2016. Repeat every 7 years  Lung Cancer Screening: (Low Dose CT Chest recommended if Age 18-80 years, 30 pack-year currently smoking OR have quit w/in 15years.) does not qualify.    Additional Screening:  Hepatitis C Screening: does qualify; Completed 11/07/2016  Vision Screening: Recommended annual ophthalmology exams for early detection of glaucoma and other disorders of the eye. Is the patient up to date with their annual eye exam?  Yes  Who is the provider or what is the name of the office in which the patient attends annual eye exams? [facility] in [place] per pt. If pt is not established with a provider, would they like to be referred to a provider to establish care? No .   Dental Screening: Recommended annual dental exams for proper oral hygiene  Community Resource Referral / Chronic Care Management: CRR required this visit?  Yes   CCM required this visit?  No  Plan:     I have personally reviewed and noted the following in the patients chart:   Medical and social history Use of alcohol, tobacco or illicit drugs  Current medications and supplements including opioid prescriptions. Patient is currently taking opioid prescriptions. Information provided to patient regarding non-opioid alternatives. Patient advised to discuss non-opioid treatment plan with their provider. Functional ability and status Nutritional status Physical activity Advanced directives List of other  physicians Hospitalizations, surgeries, and ER visits in previous 12 months Vitals Screenings to include cognitive, depression, and falls Referrals and appointments  In addition, I have reviewed and discussed with patient certain preventive protocols, quality metrics, and best practice recommendations. A written personalized care plan for preventive services as well as general preventive health recommendations were provided to patient.     [name], LPN   11/04/3966   Nurse Notes: Pt is up to date on all age appropriate health maintenance. Discussed Shingles vaccine and how to obtain. Pt states he will bring a copy of his Covid card by for documentation in chart. Pt states his Colonsocopy has been moved to every 7 years instead of every 10. Pt is currently having to care for wife, who has had multiple strokes, on own. Advised pt we can do CRR referral to see if he qualifies for caregiver assistance. Pt is agreeable and referral made.

## 2021-05-07 ENCOUNTER — Encounter: Payer: Self-pay | Admitting: Orthopedic Surgery

## 2021-05-07 ENCOUNTER — Ambulatory Visit (INDEPENDENT_AMBULATORY_CARE_PROVIDER_SITE_OTHER): Payer: Medicare HMO | Admitting: Orthopedic Surgery

## 2021-05-07 ENCOUNTER — Other Ambulatory Visit: Payer: Self-pay

## 2021-05-07 VITALS — BP 160/99 | HR 62 | Ht 69.0 in | Wt 254.0 lb

## 2021-05-07 DIAGNOSIS — M25511 Pain in right shoulder: Secondary | ICD-10-CM

## 2021-05-07 DIAGNOSIS — M542 Cervicalgia: Secondary | ICD-10-CM | POA: Diagnosis not present

## 2021-05-07 MED ORDER — CYCLOBENZAPRINE HCL 10 MG PO TABS: 10.0000 mg | ORAL_TABLET | Freq: Two times a day (BID) | ORAL | 0 refills | Status: DC | PRN

## 2021-05-07 MED ORDER — PREDNISONE 10 MG (21) PO TBPK: ORAL_TABLET | ORAL | 0 refills | Status: DC

## 2021-05-07 NOTE — Patient Instructions (Addendum)
Take medications as discussed  Work with PT  If still struggling, we can consider a cervical spine MRI   Please call the office to schedule an earlier follow up appointment if medications and therapy are not helping.

## 2021-05-07 NOTE — Progress Notes (Signed)
New Patient Visit  Assessment: [name] is a 61 y.o. male with the following: 1. Acute pain of right shoulder 2. Neck pain  Plan: Patient has severe pain in the posterior aspect of the scapula, which radiates distally into his shoulder into his index and long finger.  He has difficulty with range of motion as a result.  Based on his current presentation, I am concerned that his pain is emanating from his neck.  He is only been taking ibuprofen at this point, with limited resolution in his symptoms.  As such I am recommending a prednisone Dosepak, as well as some Flexeril.  We will also initiate some physical therapy.  If he continues to have difficulty following these treatment options, we can consider obtaining an MRI of his cervical spine.  This was discussed with the patient, and he is agreeable to this plan.   Follow-up: Return in 4 weeks (on 06/04/2021), or if symptoms worsen or fail to improve.  Subjective:  Chief Complaint  Patient presents with   Shoulder Pain    Rt shoulder     History of Present Illness: [name] is a 62 y.o. LHD male who presents for evaluation of right shoulder pain.  Pain started approximately 2 months ago.  He stated he woke up and started to have pain.  The pain is in the medial aspect of his right shoulder blade, radiates across the top of his shoulder and distally into his hand.  He notes occasional numbness and tingling to his index and long fingers.  The pain is severe.  He has difficulty with any range of motion.  He is taking gabapentin, with no change in his symptoms.  Ibuprofen is not improving his symptoms.  He states he had some pain medications associated with a procedure on his left foot, and these are not helping with his symptoms.  Are injuries to his right shoulder or his neck.   Review of Systems: No fevers or chills + numbness and tingling No chest pain No shortness of breath No bowel or bladder dysfunction No GI  distress No headaches   Medical History:  Past Medical History:  Diagnosis Date   Acute MI, inferior wall ([place]) 04/2009   Chest pain    COPD (chronic obstructive pulmonary disease) ([place])    Coronary artery disease    STATUS POST CABG 01/2009   Dyslipidemia    Hyperlipidemia    Hypertension    Orthostatic hypotension    Peripheral vascular disease ([place])    Syncope and collapse     Past Surgical History:  Procedure Laterality Date   ANKLE SURGERY     x 2   COLONOSCOPY  April 2013   CORONARY ANGIOPLASTY     CORONARY ARTERY BYPASS GRAFT  2010   FOOT SURGERY     HERNIA REPAIR  01/23/12   umb hernia   Kidney stimulator     LEFT HEART CATH AND CORS/GRAFTS ANGIOGRAPHY N/A 06/02/2016   Procedure: Left Heart Cath and Cors/Grafts Angiography;  Surgeon: Lorretta Harp, MD;  Location: [facility];  Service: Cardiovascular;  Laterality: N/A;   LEFT HEART CATHETERIZATION WITH CORONARY/GRAFT ANGIOGRAM N/A 07/08/2012   Procedure: LEFT HEART CATHETERIZATION WITH Beatrix Fetters;  Surgeon: Thayer Headings, MD;  Location: [facility];  Service: Cardiovascular;  Laterality: N/A;   PR VEIN BYPASS GRAFT,AORTO-FEM-POP     STERNAL WIRES REMOVAL  07/08/2011   Procedure: STERNAL WIRES REMOVAL;  Surgeon: Grace Isaac,  MD;  Location: [facility];  Service: Open Heart Surgery;  Laterality: N/A;  removal of 4th wire    Family History  Problem Relation Age of Onset   Coronary artery disease Father 58   Diabetes Father    Hyperlipidemia Father    Heart disease Father        before age 47   Hypertension Father    Cancer Mother        uterine   Hyperlipidemia Mother    Diabetes Mother    Hypertension Mother    Hypertension Brother    Social History   Tobacco Use   Smoking status: Former    Packs/day: 3.00    Years: 30.00    Pack years: 90.00    Types: Cigarettes, Cigars    Quit date: 01/26/2009    Years since quitting: 12.2   Smokeless tobacco: Never  Vaping Use   Vaping  Use: Never used  Substance Use Topics   Alcohol use: No   Drug use: No    Allergies  Allergen Reactions   Rosuvastatin     Muscle aches. Other reaction(s): leg pain, Other Muscle aches. Other reaction(s): leg pain Other reaction(s): leg pain     Current Meds  Medication Sig   cyclobenzaprine (FLEXERIL) 10 MG tablet Take 1 tablet (10 mg total) by mouth 2 (two) times daily as needed for muscle spasms.   predniSONE (STERAPRED UNI-PAK 21 TAB) 10 MG (21) TBPK tablet 10 mg DS 12 as directed    Objective: BP (!) 160/99    Pulse 62    Ht 5\' 9"  (1.753 m)    Wt 254 lb (115.2 kg)    BMI 37.51 kg/m   Physical Exam:  General: Alert and oriented. and No acute distress. Gait: Normal gait.  Evaluation of his neck and shoulder demonstrates no deformity.  No atrophy is appreciated.  Restricted range of motion of his neck due to pain.  Restricted range of motion due to pain.  Tenderness to palpation over the medial border of the scapula, and into the trapezius.  Sensation is intact currently throughout his right hand.  2+ radial pulse.  Tolerates forward flexion to 90 degrees, with excruciating pain beyond this range.  Abduction to 80 degrees.  IMAGING: I personally reviewed images previously obtained from the ED  X-ray of the right shoulder was obtained in the ED.  No acute injuries are noted.  Glenohumeral joint is reduced.  X-ray of the cervical spine demonstrates slight loss of normal curvature.  No anterolisthesis.  Disc heights are maintained.  Evidence of degenerative changes, including some anterior based osteophytes.   New Medications:  Meds ordered this encounter  Medications   predniSONE (STERAPRED UNI-PAK 21 TAB) 10 MG (21) TBPK tablet    Sig: 10 mg DS 12 as directed    Dispense:  48 tablet    Refill:  0   cyclobenzaprine (FLEXERIL) 10 MG tablet    Sig: Take 1 tablet (10 mg total) by mouth 2 (two) times daily as needed for muscle spasms.    Dispense:  20 tablet    Refill:   0      Mordecai Rasmussen, MD  05/07/2021 9:56 AM

## 2021-05-09 ENCOUNTER — Encounter: Payer: Self-pay | Admitting: Cardiovascular Disease

## 2021-05-09 NOTE — Progress Notes (Signed)
Cardiology Office Note   Date:  05/10/2021   ID:  [name], DOB Jul 05, 1960, MRN [id number]  PCP:  Susy Frizzle, MD  Cardiologist:   Mertie Moores, MD   Chief Complaint  Patient presents with   Coronary Artery Disease        Hyperlipidemia   Hypertension   1. Coronary artery disease: He status post CABG in October , 2010. He status post inferior wall myocardial infarction in January, 2011 with multiple stents to the native right coronary artery Dr. Burt Knack 2. Peripheral vascular disease-status post iliac stenting 3. Dyslipidemia 4.. Hypertension  Notes prior to 2014:   [name] is a 61 yo with CAD and PVD.  He has had problems with his left foot and has not been able to exercise.  He continues to have lots of sternal wound pain whenever he moves his arm or lifts anything heavy. 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   [name] has had some muscle aches which resolved when he stopped his Crestor.  He has hx of severe CAD and PVD.    He has had a nagging left ankle injury that keeps him from walking much.  May 12 ,2014: He continues to have occasional CP and frequent episodes of dyspnea. He has been told that he has COPD.   He is still in a left leg splint.  August 24, 2013:  [name] was admitted to the hospital in January with chest discomfort. A Lexiscan  Myoview study was negative for ischemia. His left systolic function is normal. He's feeling a bit better.  He has been in a boot for a chronic left ankle injury for the past several years.    07/07/2014   [name] is a 61 y.o. male who presents for HTN Has been sticking to his diet.  Walks every day.  Has lost 8-9 lbs over the past several weeks.  He is very lightheaded when he first gets up in the AM.  Has been getting worse over the past month or so.  Also has frequent episodes of orthostatic hypotension.  BP has been low on  occasion. Today's BP is normal  - he is off his BP meds.  Avoids salty foods.  No CP or dyspnea. Was told to stay off his BP meds until he saw me. He is feeling better off the BP meds    Nov. 21, 2016:  Doing well.   BP was normal when he left his house this am  A bit higher here.  Exercising a bit.  Still having left leg issus.    Sep 20, 2015:  Is having some episodes of chest tightness Occurs anytime - rest or activity . Last 30 seconds.  Has not tried an NTG.  Has more foot problems ,  Not able to exercise much .  Has lots of heartburn  Issues.   Seems different than angina   Sep 07, 2017  [name] is seen for follow up of his OSA  Was last seen by Pecolia Ades in February, 2019.  He is not had any episodes of chest pain or shortness of breath.  He is been diagnosed with obstructive sleep apnea.  He has a CPAP mask but is not tolerating it all that well.  He typically can tolerate it for about 4 hours at night.  Limited by an ankle injury,   Fell 4 years  ago , had a compund fracture of left ankle Sees the eorthopedist at [facility]    BP at home is normal .   Has significant DOE.  No angina   Jan. 13, 2023 [name] is seen for follow up of his HTN, CAD, PVD  Has a right shoulder pain  Symptoms are not similar to his angina in 2010 .  Is on prednisone  He was seen up at [facility].  He had troponin levels drawn which were mildly elevated.  We will recheck troponin levels today.  EKG is unremarkable.  His symptoms are not at all similar to his previous episodes of angina.  I do not think that his right neck and shoulder pain are due to a cardiac etiology. Has COPD, CAD , HLD     Past Medical History:  Diagnosis Date   Acute MI, inferior wall ([place]) 04/2009   Chest pain    COPD (chronic obstructive pulmonary disease) ([place])    Coronary artery disease    STATUS POST CABG 01/2009   Dyslipidemia    Hyperlipidemia    Hypertension    Orthostatic hypotension     Peripheral vascular disease ([facility])    Syncope and collapse     Past Surgical History:  Procedure Laterality Date   ANKLE SURGERY     x 2   COLONOSCOPY  April 2013   CORONARY ANGIOPLASTY     CORONARY ARTERY BYPASS GRAFT  2010   FOOT SURGERY     HERNIA REPAIR  01/23/12   umb hernia   Kidney stimulator     LEFT HEART CATH AND CORS/GRAFTS ANGIOGRAPHY N/A 06/02/2016   Procedure: Left Heart Cath and Cors/Grafts Angiography;  Surgeon: Lorretta Harp, MD;  Location: [facility];  Service: Cardiovascular;  Laterality: N/A;   LEFT HEART CATHETERIZATION WITH CORONARY/GRAFT ANGIOGRAM N/A 07/08/2012   Procedure: LEFT HEART CATHETERIZATION WITH Beatrix Fetters;  Surgeon: Thayer Headings, MD;  Location: [facility] CATH LAB;  Service: Cardiovascular;  Laterality: N/A;   PR VEIN BYPASS GRAFT,AORTO-FEM-POP     STERNAL WIRES REMOVAL  07/08/2011   Procedure: STERNAL WIRES REMOVAL;  Surgeon: Grace Isaac, MD;  Location: [facility];  Service: Open Heart Surgery;  Laterality: N/A;  removal of 4th wire     Current Outpatient Medications  Medication Sig Dispense Refill   albuterol (VENTOLIN HFA) 108 (90 Base) MCG/ACT inhaler INHALE 2 PUFFS INTO THE LUNGS EVERY 4 HOURS AS NEEDED FOR WHEEZING OR SHORTNESS OF BREATH 42.5 g 3   amLODipine (NORVASC) 10 MG tablet TAKE 1 TABLET(10 MG) BY MOUTH DAILY 90 tablet 3   amoxicillin (AMOXIL) 875 MG tablet Take 1 tablet (875 mg total) by mouth 2 (two) times daily. 20 tablet 0   aspirin EC 81 MG tablet Take 1 tablet (81 mg total) by mouth every morning. 90 tablet 3   atorvastatin (LIPITOR) 80 MG tablet TAKE 1 TABLET BY MOUTH EVERY NIGHT AT BEDTIME 90 tablet 3   cephALEXin (KEFLEX) 500 MG capsule Take 1 capsule (500 mg total) by mouth 4 (four) times daily. 20 capsule 0   cloNIDine (CATAPRES) 0.1 MG tablet      clopidogrel (PLAVIX) 75 MG tablet TAKE 1 TABLET(75 MG) BY MOUTH DAILY 30 tablet 0   cyclobenzaprine (FLEXERIL) 10 MG tablet Take 1 tablet (10 mg total) by mouth 2  (two) times daily as needed for muscle spasms. 20 tablet 0   Evolocumab (REPATHA SURECLICK) 161 MG/ML SOAJ Inject 140 mg into the skin  every 14 (fourteen) days. 6 mL 3   furosemide (LASIX) 20 MG tablet TAKE 1 TABLET BY MOUTH THREE TIMES A WEEK AS NEEDED 30 tablet 0   gabapentin (NEURONTIN) 800 MG tablet Take 800 mg by mouth 3 (three) times daily.     hydrochlorothiazide (HYDRODIURIL) 25 MG tablet Take 1 tablet (25 mg total) by mouth daily. Pt past due for 6 month follow up; please call for appt 90 tablet 3   ibuprofen (ADVIL) 800 MG tablet Take 1 tablet (800 mg total) by mouth 3 (three) times daily. Take with food 21 tablet 0   losartan (COZAAR) 100 MG tablet TAKE 1 TABLET BY MOUTH DAILY 90 tablet 3   methocarbamol (ROBAXIN) 500 MG tablet Take 1 tablet (500 mg total) by mouth 2 (two) times daily. 20 tablet 4   omeprazole (PRILOSEC) 40 MG capsule Take 1 capsule by mouth daily.     oxyCODONE-acetaminophen (PERCOCET) 10-325 MG tablet Take 1 tablet by mouth every 6 (six) hours as needed.     OXYCONTIN 20 MG 12 hr tablet Take 20 mg by mouth 2 (two) times daily.     predniSONE (STERAPRED UNI-PAK 21 TAB) 10 MG (21) TBPK tablet 10 mg DS 12 as directed 48 tablet 0   No current facility-administered medications for this visit.    Allergies:   Rosuvastatin    Social History:  The patient  reports that he quit smoking about 12 years ago. His smoking use included cigarettes and cigars. He has a 90.00 pack-year smoking history. He has never used smokeless tobacco. He reports that he does not drink alcohol and does not use drugs.   Family History:  The patient's family history includes Cancer in his mother; Coronary artery disease (age of onset: 13) in his father; Diabetes in his father and mother; Heart disease in his father; Hyperlipidemia in his father and mother; Hypertension in his brother, father, and mother.    ROS:  Noted in current hx ,. Otherwise negativ e   Physical Exam: Blood pressure (!)  154/94, pulse 60, height 5\' 9"  (1.753 m), weight 254 lb 3.2 oz (115.3 kg), SpO2 98 %.  GEN:  Well nourished, well developed in no acute distress HEENT: Normal NECK: No JVD; No carotid bruits LYMPHATICS: No lymphadenopathy CARDIAC: RRR , no murmurs, rubs, gallops RESPIRATORY:  Clear to auscultation without rales, wheezing or rhonchi  ABDOMEN: Soft, non-tender, non-distended MUSCULOSKELETAL:  No edema; No deformity  SKIN: Warm and dry NEUROLOGIC:  Alert and oriented x 3  EKG: May 10, 2021: Normal sinus rhythm at 60.  No ST or T wave abnormalities.  Recent Labs: 04/15/2021: BUN 17; Creatinine, Ser 1.10; Hemoglobin 14.8; Platelets 176; Potassium 4.3; Sodium 140    Lipid Panel    Component Value Date/Time   CHOL 119 02/15/2020 0821   CHOL 108 12/17/2018 0746   TRIG 92 02/15/2020 0821   HDL 43 02/15/2020 0821   HDL 36 (L) 12/17/2018 0746   CHOLHDL 2.8 02/15/2020 0821   VLDL 24 11/06/2016 0849   LDLCALC 58 02/15/2020 0821   LDLDIRECT 117 (H) 02/23/2017 1549      Wt Readings from Last 3 Encounters:  05/10/21 254 lb 3.2 oz (115.3 kg)  05/07/21 254 lb (115.2 kg)  05/02/21 254 lb (115.2 kg)      Other studies Reviewed: Additional studies/ records that were reviewed today include: . Review of the above records demonstrates:    ASSESSMENT AND PLAN:  1. Coronary artery disease: Status post coronary  artery bypass grafting in 2010.  His current symptoms of right shoulder pain are not similar to his angina  He was up in the [facility], ER and had troponin levels drawn.  Found to be mildly elevated.  We will recheck troponins now  2. Peripheral vascular disease- stable   3. Dyslipidemia-   check labs today.  4.. Hypertension-      5.  Obstructive sleep [place]:     Current medicines are reviewed at length with the patient today.  The patient does not have concerns regarding medicines.  The following changes have been made:  no change  Labs/ tests ordered today include:    Orders Placed This Encounter  Procedures   Troponin T   EKG 12-Lead    Disposition:   to see Richardson Dopp, PA in 1 year     Signed, Mertie Moores, MD  05/10/2021 5:21 PM    [facility] [place], [place], [place]  [address] Phone: [phone]; Fax: [phone]

## 2021-05-10 ENCOUNTER — Ambulatory Visit (INDEPENDENT_AMBULATORY_CARE_PROVIDER_SITE_OTHER): Payer: Medicare HMO | Admitting: Cardiovascular Disease

## 2021-05-10 ENCOUNTER — Other Ambulatory Visit: Payer: Medicare HMO | Admitting: *Deleted

## 2021-05-10 ENCOUNTER — Encounter: Payer: Self-pay | Admitting: Cardiovascular Disease

## 2021-05-10 ENCOUNTER — Other Ambulatory Visit: Payer: Self-pay

## 2021-05-10 VITALS — BP 154/94 | HR 60 | Ht 69.0 in | Wt 254.2 lb

## 2021-05-10 DIAGNOSIS — E782 Mixed hyperlipidemia: Secondary | ICD-10-CM

## 2021-05-10 DIAGNOSIS — I2510 Atherosclerotic heart disease of native coronary artery without angina pectoris: Secondary | ICD-10-CM | POA: Diagnosis not present

## 2021-05-10 LAB — HEPATIC FUNCTION PANEL
ALT: 28 IU/L (ref 0–44)
AST: 26 IU/L (ref 0–40)
Albumin: 4.5 g/dL (ref 3.8–4.9)
Alkaline Phosphatase: 91 IU/L (ref 44–121)
Bilirubin Total: 0.6 mg/dL (ref 0.0–1.2)
Bilirubin, Direct: 0.16 mg/dL (ref 0.00–0.40)
Total Protein: 7.1 g/dL (ref 6.0–8.5)

## 2021-05-10 LAB — LIPID PANEL
Chol/HDL Ratio: 2.6 ratio (ref 0.0–5.0)
Cholesterol, Total: 110 mg/dL (ref 100–199)
HDL: 43 mg/dL (ref >39)
LDL Chol Calc (NIH): 50 mg/dL (ref 0–99)
Triglycerides: 89 mg/dL (ref 0–149)
VLDL Cholesterol Cal: 17 mg/dL (ref 5–40)

## 2021-05-10 LAB — TROPONIN T: Troponin T (Highly Sensitive): 23 ng/L (ref 0–22)

## 2021-05-10 NOTE — Patient Instructions (Signed)
Medication Instructions:  Your physician recommends that you continue on your current medications as directed. Please refer to the Current Medication list given to you today.  *If you need a refill on your cardiac medications before your next appointment, please call your pharmacy*  Lab Work: Your physician recommends that you have lab work today- Troponin  If you have labs (blood work) drawn today and your tests are completely normal, you will receive your results only by: MyChart Message (if you have MyChart) OR A paper copy in the mail If you have any lab test that is abnormal or we need to change your treatment, we will call you to review the results.  Testing/Procedures: None ordered today.  Follow-Up: At [facility], you and your health needs are our priority.  As part of our continuing mission to provide you with exceptional heart care, we have created designated Provider Care Teams.  These Care Teams include your primary Cardiologist (physician) and Advanced Practice Providers (APPs -  Physician Assistants and Nurse Practitioners) who all work together to provide you with the care you need, when you need it.  We recommend signing up for the patient portal called "MyChart".  Sign up information is provided on this After Visit Summary.  MyChart is used to connect with patients for Virtual Visits (Telemedicine).  Patients are able to view lab/test results, encounter notes, upcoming appointments, etc.  Non-urgent messages can be sent to your provider as well.   To learn more about what you can do with MyChart, go to [email or web address].    Your next appointment:   1 year(s)  The format for your next appointment:   In Person

## 2021-05-13 ENCOUNTER — Telehealth: Payer: Self-pay

## 2021-05-13 NOTE — Telephone Encounter (Signed)
° °  Telephone encounter was:  Successful.  05/13/2021 Name: [name] MRN: [id number] DOB: [birth year]  [name] is a 61 y.o. year old male who is a primary care patient of Pickard, Cammie Mcgee, MD . The community resource team was consulted for assistance with  [place] guide performed the following interventions: Patient provided with information about care guide support team and interviewed to confirm resource needs.Patient stated he was in need for care for his wife if insurance would pay for the care. She needs a CNA to come give a bath and things of that nature she also had dementia. He has [employer] and she has [place] insurance  Follow Up Plan:  Care guide will follow up with patient by phone over the next day    [place], Care Management  [phone] [address], [place], [place] [address] Phone: [phone] Email: [email or web address]

## 2021-05-14 ENCOUNTER — Telehealth: Payer: Self-pay

## 2021-05-14 NOTE — Telephone Encounter (Signed)
° °  Telephone encounter was:  Successful.  05/14/2021 Name: [name] MRN: [id number] DOB: 02-25-1961  [name] is a 61 y.o. year old male who is a primary care patient of Pickard, Cammie Mcgee, MD . The community resource team was consulted for assistance with Caregiver Stress  Care guide performed the following interventions: Patient provided with information about care guide support team and interviewed to confirm resource needs.Pt stated he didnt need resources mailed to him if the insuranced is not going to cover the whole cost. Becouse he does not have the money pay any of the cost. He refused any resources  Follow Up Plan:  No further follow up planned at this time. The patient has been provided with needed resources.but refused additional information to be sent to him.    [facility], Care Management  [phone] [address], [place], [place] [address] Phone: [phone] Email: [email or web address]

## 2021-05-20 ENCOUNTER — Telehealth: Payer: Self-pay | Admitting: Orthopedic Surgery

## 2021-05-20 NOTE — Telephone Encounter (Signed)
Patient called and wants a refill on his medicine   predniSONE (STERAPRED UNI-PAK 21 TAB) 10 MG (21) TBPK tablet  Pharmacy : [facility] on [address]   Patient has COVID and is unable to come in and be seen at this time.   Please call him back at [phone]

## 2021-05-21 MED ORDER — PREDNISONE 10 MG (21) PO TBPK: ORAL_TABLET | ORAL | 0 refills | Status: DC

## 2021-05-21 NOTE — Telephone Encounter (Signed)
Patient called back, relays he has not heard back about the request for refill of predniSONE (STERAPRED UNI-PAK 21 TAB) 10 MG (21) TBPK tablet. As noted, patient has COVID.

## 2021-05-22 ENCOUNTER — Ambulatory Visit (HOSPITAL_COMMUNITY): Payer: Medicare HMO | Admitting: Occupational Therapy

## 2021-05-29 ENCOUNTER — Ambulatory Visit: Payer: PPO | Admitting: Cardiovascular Disease

## 2021-05-30 ENCOUNTER — Other Ambulatory Visit: Payer: Self-pay

## 2021-05-30 ENCOUNTER — Ambulatory Visit (HOSPITAL_COMMUNITY): Payer: Medicare HMO | Attending: Orthopedic Surgery | Admitting: Occupational Therapy

## 2021-05-30 ENCOUNTER — Encounter (HOSPITAL_COMMUNITY): Payer: Self-pay | Admitting: Occupational Therapy

## 2021-05-30 DIAGNOSIS — M25511 Pain in right shoulder: Secondary | ICD-10-CM | POA: Insufficient documentation

## 2021-05-30 DIAGNOSIS — M542 Cervicalgia: Secondary | ICD-10-CM | POA: Diagnosis not present

## 2021-05-30 DIAGNOSIS — R29898 Other symptoms and signs involving the musculoskeletal system: Secondary | ICD-10-CM | POA: Diagnosis not present

## 2021-05-30 NOTE — Therapy (Signed)
[facility] [address] [place], [place], [address] Phone: [phone]   Fax:  [phone]  Occupational Therapy Evaluation  Patient Details  Name: [name] MRN: [id number] Date of Birth: 1960/07/01 Referring Provider (OT): Dr. Larena Glassman   Encounter Date: 05/30/2021   OT End of Session - 05/30/21 0928     Visit Number 1    Number of Visits 1    Date for OT Re-Evaluation 05/31/21    Authorization Type [employer], $20 copay    Progress Note Due on Visit 10    OT Start Time 0900    OT Stop Time 0924    OT Time Calculation (min) 24 min    Activity Tolerance Patient tolerated treatment well    Behavior During Therapy [facility] for tasks assessed/performed             Past Medical History:  Diagnosis Date   Acute MI, inferior wall ([place]) 04/2009   Chest pain    COPD (chronic obstructive pulmonary disease) ([place])    Coronary artery disease    STATUS POST CABG 01/2009   Dyslipidemia    Hyperlipidemia    Hypertension    Orthostatic hypotension    Peripheral vascular disease ([place])    Syncope and collapse     Past Surgical History:  Procedure Laterality Date   ANKLE SURGERY     x 2   COLONOSCOPY  April 2013   CORONARY ANGIOPLASTY     CORONARY ARTERY BYPASS GRAFT  2010   FOOT SURGERY     HERNIA REPAIR  01/23/12   umb hernia   Kidney stimulator     LEFT HEART CATH AND CORS/GRAFTS ANGIOGRAPHY N/A 06/02/2016   Procedure: Left Heart Cath and Cors/Grafts Angiography;  Surgeon: Lorretta Harp, MD;  Location: [facility];  Service: Cardiovascular;  Laterality: N/A;   LEFT HEART CATHETERIZATION WITH CORONARY/GRAFT ANGIOGRAM N/A 07/08/2012   Procedure: LEFT HEART CATHETERIZATION WITH Beatrix Fetters;  Surgeon: Thayer Headings, MD;  Location: [facility] CATH LAB;  Service: Cardiovascular;  Laterality: N/A;   PR VEIN BYPASS GRAFT,AORTO-FEM-POP     STERNAL WIRES REMOVAL  07/08/2011   Procedure: STERNAL WIRES REMOVAL;   Surgeon: Grace Isaac, MD;  Location: [facility];  Service: Open Heart Surgery;  Laterality: N/A;  removal of 4th wire    There were no vitals filed for this visit.   Subjective Assessment - 05/30/21 0924     Subjective  S: I have this knife like pain anytime I move my arm.    Pertinent History Pt is a 61 y/o male presenting with right shoulder and cervical neck pain, present for approximately 3 months. Pt reports he had a x-ray in December, MRI is needed but insurance will not cover until therapy has been completed. Pt was referred to occupational therapy for evaluation and treatment by Dr. Larena Glassman.    Special Tests FOTO: 46/100    Patient Stated Goals To have less pain    Currently in Pain? Yes    Pain Score 9     Pain Location Neck    Pain Orientation Right    Pain Descriptors / Indicators Stabbing    Pain Type Acute pain    Pain Radiating Towards shoulder and down arm    Pain Onset More than a month ago    Pain Frequency Constant    Aggravating Factors  any movement    Pain Relieving Factors nothing  Effect of Pain on Daily Activities max effect on use of RUE during ADLs    Multiple Pain Sites No               [facility] OT Assessment - 05/30/21 0858       Assessment   Medical Diagnosis right shoulder and neck pain    Referring Provider (OT) Dr. Larena Glassman    Onset Date/Surgical Date 02/26/21    Hand Dominance Left    Next MD Visit 06/04/21    Prior Therapy None      Precautions   Precautions None      Restrictions   Weight Bearing Restrictions No      Balance Screen   Has the patient fallen in the past 6 months No      Prior Function   Level of Independence Independent    Vocation Unemployed    Leisure mechanical tasks-cars, transmissions, etc.      ADL   ADL comments Pt is having difficulty with all ADLs-dressing, bathing, reaching overhead and behind back. Pts gets very little sleep at night, sleeps in recliner as he is unable to sleep in the bed.       Written Expression   Dominant Hand Left      Cognition   Overall Cognitive Status Within Functional Limits for tasks assessed      Observation/Other Assessments   Focus on Therapeutic Outcomes (FOTO)  46/100      ROM / Strength   AROM / PROM / Strength AROM;PROM;Strength      Palpation   Palpation comment mod fascial restrictions along right shoulder and cervical neck regions      AROM   Overall AROM Comments Assessed seated, er/IR adducted    AROM Assessment Site Shoulder;Cervical    Right/Left Shoulder Right    Right Shoulder Flexion 35 Degrees    Right Shoulder ABduction 37 Degrees    Right Shoulder Internal Rotation 90 Degrees    Right Shoulder External Rotation 10 Degrees    Cervical Flexion 10    Cervical Extension 10    Cervical - Right Side Bend 10    Cervical - Left Side Bend 16    Cervical - Right Rotation 8    Cervical - Left Rotation 7      PROM   Overall PROM  Unable to assess;Due to pain    PROM Assessment Site Shoulder    Right/Left Shoulder Right      Strength   Overall Strength Unable to assess;Due to pain    Strength Assessment Site Shoulder    Right/Left Shoulder Right                              OT Education - 05/30/21 0915     Education Details attempted table slides-terminated due to severity of pain    Person(s) Educated Patient    Methods Explanation;Demonstration;Handout    Comprehension Verbalized understanding;Returned demonstration                        Plan - 05/30/21 0929     Clinical Impression Statement A: Pt is a 61 y/o male presenting with severe cervical neck and right shoulder pain that began approximately 3 months ago with unknown origin. Pt reports stabbing pain with any movements, is significantly limited during evaluation and unable to tolerate simple passive exercises, active exercises, or ROM/strength measurements. Pt with severely  limited ROM in both cervical neck and shoulder, has  stabbing sensation with any movement and is unable to continue the movement. All testing and attempts at exercises increased pain and caused stabbing sensation to radiate down arm and into forearm. Pt ambulating with arm resting in jacket pocket due to pain with hanging by side or keeping tucked into side. Pt is unable to participate in therapy services at this time due to severity of pain and poor activity tolerance. Recommend further imaging studies and potential referral to neurosurgeon for consultation if warranted.    OT Occupational Profile and History Problem Focused Assessment - Including review of records relating to presenting problem    Occupational performance deficits (Please refer to evaluation for details): ADL's;IADL's;Rest and Sleep;Work;Leisure    Body Structure / Function / Physical Skills Pain;ROM;Muscle spasms;UE functional use;Endurance;ADL;Fascial restriction;IADL;Strength    Rehab Potential Poor    Clinical Decision Making Limited treatment options, no task modification necessary    Comorbidities Affecting Occupational Performance: None    Modification or Assistance to Complete Evaluation  No modification of tasks or assist necessary to complete eval    OT Frequency One time visit    OT Treatment/Interventions Patient/family education    Plan P: Pt is not recommended for further occupational therapy services until additional imaging has been completed and pain is more controlled. All attempts at exercise have caused immediate severe stabbing pain and were terminated. Additional therapy is contraindicated until definitive diagnosis and medical intervention have been initiated.    OT Home Exercise Plan eval: attempted table slides, pt unable and HEP was terminated.    Consulted and Agree with Plan of Care Patient             Patient will benefit from skilled therapeutic intervention in order to improve the following deficits and impairments:   Body Structure / Function /  Physical Skills: Pain, ROM, Muscle spasms, UE functional use, Endurance, ADL, Fascial restriction, IADL, Strength       Visit Diagnosis: Acute pain of right shoulder - Plan: Ot plan of care cert/re-cert  Cervicalgia - Plan: Ot plan of care cert/re-cert  Other symptoms and signs involving the musculoskeletal system - Plan: Ot plan of care cert/re-cert    Problem List Patient Active Problem List   Diagnosis Date Noted   Diverticular disease of colon 05/02/2021   Gastro-esophageal reflux disease without esophagitis 05/02/2021   Hematuria 05/02/2021   Personal history of colonic polyps 05/02/2021   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   Mixed hyperlipidemia 02/23/2017   Peripheral vascular disease ([place]) 06/01/2016   Nasolacrimal duct obstruction, acquired, right 03/11/2016   Medication side effects 06/28/2014   COPD (chronic obstructive pulmonary disease) ([place]) 05/24/2013   Coronary artery disease 06/14/2012   Chronic, continuous use of opioids 06/14/2012   Fecal incontinence 79/48/0165   Umbilical hernia 53/74/8270   Increased frequency of urination 12/11/2011   Prostatitis 11/13/2011   Atherosclerosis of native arteries of the extremities with intermittent claudication 10/03/2011   Pain in limb 10/03/2011   Fecal smearing 09/26/2011   Acquired hindfoot varus 08/12/2011   Instability of ankle joint 08/12/2011   Painful sternal wire 06/30/2011   Encounter for therapeutic drug monitoring 04/06/2011   Sural neuritis 04/06/2011   Ankle pain 01/31/2011   Peripheral neuropathy 01/31/2011   Chronic pain syndrome 01/30/2011   Leg cramps 11/12/2010   Hx of Inferior MI    Essential hypertension    Dyspnea    Orthostatic hypotension    History of syncope  Guadelupe Sabin, OTR/L  [phone] 05/30/2021, 9:38 AM  [facility] [address] [place], [place], [address] Phone: [phone]   Fax:  [phone]  Name: [name] MRN:  [id number] Date of Birth: 1960-09-01

## 2021-05-30 NOTE — Patient Instructions (Signed)
1) SHOULDER: Flexion On Table   Place hands on towel placed on table, elbows straight. Lean forward with you upper body, pushing towel away from body.  _10__ reps per set, __2-3_ sets per day  2) Abduction (Passive)   With arm out to side, resting on towel placed on table with palm DOWN, keeping trunk away from table, lean to the side while pushing towel away from body.  Repeat __10__ times. Do __2-3__ sessions per day.  Copyright  VHI. All rights reserved.     3) Internal Rotation (Assistive)   Seated with elbow bent at right angle and held against side, slide arm on table surface in an inward arc keeping elbow anchored in place. Repeat __10__ times. Do __2-3__ sessions per day. Activity: Use this motion to brush crumbs off the table.  Copyright  VHI. All rights reserved.

## 2021-06-01 ENCOUNTER — Other Ambulatory Visit: Payer: Self-pay | Admitting: Cardiovascular Disease

## 2021-06-03 ENCOUNTER — Other Ambulatory Visit: Payer: Self-pay | Admitting: *Deleted

## 2021-06-03 MED ORDER — CLOPIDOGREL BISULFATE 75 MG PO TABS: ORAL_TABLET | ORAL | 3 refills | Status: DC

## 2021-06-04 ENCOUNTER — Ambulatory Visit: Payer: Medicare HMO | Admitting: Orthopedic Surgery

## 2021-06-04 ENCOUNTER — Encounter: Payer: Self-pay | Admitting: Orthopedic Surgery

## 2021-06-04 ENCOUNTER — Other Ambulatory Visit: Payer: Self-pay

## 2021-06-04 VITALS — BP 171/101 | HR 80 | Ht 69.0 in | Wt 253.0 lb

## 2021-06-04 DIAGNOSIS — M542 Cervicalgia: Secondary | ICD-10-CM

## 2021-06-04 DIAGNOSIS — M25511 Pain in right shoulder: Secondary | ICD-10-CM | POA: Diagnosis not present

## 2021-06-04 MED ORDER — CYCLOBENZAPRINE HCL 10 MG PO TABS: 10.0000 mg | ORAL_TABLET | Freq: Two times a day (BID) | ORAL | 0 refills | Status: DC | PRN

## 2021-06-04 NOTE — Progress Notes (Signed)
New Patient Visit  Assessment: [name] is a 61 y.o. male with the following: Cervical radiculopathy  Plan: Patient continues to have severe pain in the posterior aspect of the right shoulder, which is tender to palpation.  Pain radiates distally through the shoulder, all the way to his hand.  He has occasional numbness and tingling.  His symptoms worsen with rotation of his head to the right.  Prednisone provided limited relief of his symptoms.  He could not tolerate physical therapy.  He has no tenderness to palpation in the right shoulder.  I am concerned that he has pathology within the cervical spine.  I have recommended an urgent cervical spine MRI.  Once the scan has been completed, we will discuss the results.  He is amenable to this plan.  Follow-up: Return for After MRI.  Subjective:  Chief Complaint  Patient presents with   Shoulder Pain    Rt shoulder pain.    History of Present Illness: [name] is a 61 y.o. LHD male who returns for evaluation of right shoulder and neck pain.  Pain started approximately 3 months ago.  At the last visit, I gave him a prednisone Dosepak.  He states that this helped with his pain initially, but was primarily ineffective.  We did try second prescription for the prednisone Dosepak, once again with minimal improvement in his pain.  Pain is in the posterior aspect of the right shoulder.  It radiates distally through the shoulder, all the way to his hands.  Pain worsens when he rotates his head to the right.  He has no tenderness to palpation around the right shoulder.  He has difficulty with overhead motion due to the pain in the back of his shoulder.  He went to physical therapy, and was unable to tolerate exercises.  As a result, physical therapy recommended he stop.   Review of Systems: No fevers or chills + numbness and tingling No chest pain No shortness of breath No bowel or bladder dysfunction No GI distress No  headaches   Objective: BP (!) 171/101    Pulse 80    Ht 5\' 9"  (1.753 m)    Wt 253 lb (114.8 kg)    BMI 37.36 kg/m   Physical Exam:  General: Alert and oriented. and No acute distress. Gait: Normal gait.  Evaluation of his neck and shoulder demonstrates no deformity.  No atrophy is appreciated.  Restricted range of motion of his neck due to pain.  Tenderness to palpation over the medial border of the scapula, and into the trapezius.  Sensation is intact currently throughout his right hand.  2+ radial pulse.  Tolerates forward flexion to 90 degrees, with excruciating pain beyond this range.  Abduction to 80 degrees.  His pain worsens with rotation of his head to the right.  IMAGING: I personally reviewed images previously obtained from the ED  No new imaging obtained today.   New Medications:  Meds ordered this encounter  Medications   cyclobenzaprine (FLEXERIL) 10 MG tablet    Sig: Take 1 tablet (10 mg total) by mouth 2 (two) times daily as needed for muscle spasms.    Dispense:  20 tablet    Refill:  0      Allen Rasmussen, MD  06/04/2021 9:03 AM

## 2021-06-04 NOTE — Patient Instructions (Signed)
We will place an order for a cervical spine MRI.  Once you have a date for the MRI, please call the clinic to schedule a follow up appointment to review the results.

## 2021-06-16 ENCOUNTER — Other Ambulatory Visit: Payer: Self-pay | Admitting: Physician Assistant

## 2021-06-16 ENCOUNTER — Other Ambulatory Visit: Payer: Self-pay | Admitting: Family Medicine

## 2021-06-16 DIAGNOSIS — I10 Essential (primary) hypertension: Secondary | ICD-10-CM

## 2021-06-17 ENCOUNTER — Telehealth: Payer: Self-pay

## 2021-06-17 DIAGNOSIS — M542 Cervicalgia: Secondary | ICD-10-CM

## 2021-06-17 NOTE — Telephone Encounter (Signed)
Patient called stating that [facility] told him that they could not do his MRI due to the stimulator in his back.  He stated they told him that the hospital may be able to do, but that we would have to call to see and change the order.  Patient states that his fingers on his right hand is getting numb. He is wanting to know what the next step is for him. Please  call and advise  [phone]

## 2021-06-18 ENCOUNTER — Other Ambulatory Visit: Payer: Self-pay

## 2021-06-18 NOTE — Telephone Encounter (Signed)
Called and left VM with Tasha at scheduling, to see if pt is able to have MRI at [facility], awaiting return call.

## 2021-06-19 ENCOUNTER — Other Ambulatory Visit: Payer: Self-pay | Admitting: Family Medicine

## 2021-06-19 NOTE — Telephone Encounter (Signed)
I called patient LM advising, asked him to call with any questions and also call to let us know he got the message/info.

## 2021-06-19 NOTE — Telephone Encounter (Signed)
Per MRI scheduler, he can not have scan at all. Per patient his device has not worked in 10 years. Device has to be working and remote has to be working to put in MR mode if safe to scan. But since his is not working it is not safe to scan at all.

## 2021-06-24 DIAGNOSIS — M5412 Radiculopathy, cervical region: Secondary | ICD-10-CM | POA: Diagnosis not present

## 2021-06-24 DIAGNOSIS — I10 Essential (primary) hypertension: Secondary | ICD-10-CM | POA: Diagnosis not present

## 2021-06-24 DIAGNOSIS — Z6838 Body mass index (BMI) 38.0-38.9, adult: Secondary | ICD-10-CM | POA: Diagnosis not present

## 2021-06-25 DIAGNOSIS — M79672 Pain in left foot: Secondary | ICD-10-CM | POA: Diagnosis not present

## 2021-06-25 DIAGNOSIS — G8921 Chronic pain due to trauma: Secondary | ICD-10-CM | POA: Diagnosis not present

## 2021-06-25 DIAGNOSIS — M25572 Pain in left ankle and joints of left foot: Secondary | ICD-10-CM | POA: Diagnosis not present

## 2021-06-26 ENCOUNTER — Other Ambulatory Visit: Payer: Medicare HMO

## 2021-06-26 ENCOUNTER — Telehealth: Payer: Self-pay | Admitting: Cardiovascular Disease

## 2021-06-26 NOTE — Telephone Encounter (Signed)
Patient states he received a notification on his phone stating he needs to have lab work, but he recently had lab work so he is unsure whether he needs repeat labs or additional panels. No current orders placed. Please advise. ?

## 2021-06-26 NOTE — Telephone Encounter (Signed)
Sent [employer]. ? ?No lab work needed for [employer]. ?

## 2021-06-27 ENCOUNTER — Other Ambulatory Visit: Payer: Self-pay | Admitting: Neurosurgery

## 2021-06-27 DIAGNOSIS — M5412 Radiculopathy, cervical region: Secondary | ICD-10-CM

## 2021-06-28 ENCOUNTER — Other Ambulatory Visit: Payer: Self-pay

## 2021-06-28 ENCOUNTER — Ambulatory Visit
Admission: RE | Admit: 2021-06-28 | Discharge: 2021-06-28 | Disposition: A | Discharge status: Home / Self Care | Payer: Medicare HMO | Source: Ambulatory Visit | Attending: Neurosurgery | Admitting: Neurosurgery

## 2021-06-28 DIAGNOSIS — M50122 Cervical disc disorder at C5-C6 level with radiculopathy: Secondary | ICD-10-CM | POA: Diagnosis not present

## 2021-06-28 DIAGNOSIS — M5412 Radiculopathy, cervical region: Secondary | ICD-10-CM

## 2021-06-28 DIAGNOSIS — M50121 Cervical disc disorder at C4-C5 level with radiculopathy: Secondary | ICD-10-CM | POA: Diagnosis not present

## 2021-06-28 MED ORDER — IOPAMIDOL (ISOVUE-M 300) INJECTION 61%
10.0000 mL | Freq: Once | INTRAMUSCULAR | Status: AC | PRN
  Administered 2021-06-28: 10 mL via INTRA_ARTICULAR

## 2021-06-28 MED ORDER — ONDANSETRON HCL 4 MG/2ML IJ SOLN: 4.0000 mg | Freq: Once | INTRAMUSCULAR | Status: DC | PRN

## 2021-06-28 MED ORDER — DIAZEPAM 5 MG PO TABS
10.0000 mg | ORAL_TABLET | Freq: Once | ORAL | Status: AC
  Administered 2021-06-28: 10 mg via ORAL

## 2021-06-28 MED ORDER — MEPERIDINE HCL 50 MG/ML IJ SOLN: 50.0000 mg | Freq: Once | INTRAMUSCULAR | Status: DC | PRN

## 2021-06-28 NOTE — Discharge Instructions (Signed)

## 2021-07-03 DIAGNOSIS — G8929 Other chronic pain: Secondary | ICD-10-CM | POA: Diagnosis not present

## 2021-07-03 DIAGNOSIS — M25511 Pain in right shoulder: Secondary | ICD-10-CM | POA: Diagnosis not present

## 2021-07-03 DIAGNOSIS — Z6838 Body mass index (BMI) 38.0-38.9, adult: Secondary | ICD-10-CM | POA: Diagnosis not present

## 2021-07-03 DIAGNOSIS — I10 Essential (primary) hypertension: Secondary | ICD-10-CM | POA: Diagnosis not present

## 2021-07-08 DIAGNOSIS — Z960 Presence of urogenital implants: Secondary | ICD-10-CM | POA: Diagnosis not present

## 2021-07-08 DIAGNOSIS — R159 Full incontinence of feces: Secondary | ICD-10-CM | POA: Diagnosis not present

## 2021-07-12 ENCOUNTER — Other Ambulatory Visit: Payer: Self-pay | Admitting: Physician Assistant

## 2021-07-16 ENCOUNTER — Other Ambulatory Visit: Payer: Self-pay

## 2021-07-16 ENCOUNTER — Ambulatory Visit: Payer: Medicare HMO | Admitting: Orthopaedic Surgery

## 2021-07-16 ENCOUNTER — Encounter: Payer: Self-pay | Admitting: Orthopaedic Surgery

## 2021-07-16 DIAGNOSIS — M542 Cervicalgia: Secondary | ICD-10-CM

## 2021-07-16 DIAGNOSIS — G8929 Other chronic pain: Secondary | ICD-10-CM

## 2021-07-16 DIAGNOSIS — M7918 Myalgia, other site: Secondary | ICD-10-CM

## 2021-07-16 DIAGNOSIS — M25511 Pain in right shoulder: Secondary | ICD-10-CM

## 2021-07-16 MED ORDER — BUPIVACAINE HCL 0.5 % IJ SOLN
2.0000 mL | INTRAMUSCULAR | Status: AC | PRN
  Administered 2021-07-16: 2 mL

## 2021-07-16 MED ORDER — METHYLPREDNISOLONE ACETATE 40 MG/ML IJ SUSP
40.0000 mg | INTRAMUSCULAR | Status: AC | PRN
  Administered 2021-07-16: 40 mg via INTRAMUSCULAR

## 2021-07-16 MED ORDER — LIDOCAINE HCL 1 % IJ SOLN
2.0000 mL | INTRAMUSCULAR | Status: AC | PRN
  Administered 2021-07-16: 2 mL

## 2021-07-16 NOTE — Progress Notes (Signed)
? ?Office Visit Note ?  ?Patient: [name]           ?Date of Birth: 09-13-1960           ?MRN: [id number] ?Visit Date: 07/16/2021 ?             ?Requested by: Allen Frizzle, [name] ?[address] [place],  [place] [address] ?PCP: Allen Frizzle, [name] ? ? ?Assessment & Plan: ?Visit Diagnoses:  ?1. Chronic right shoulder pain   ? ? ?Plan: Based on findings I feel that he has scapular trigger points but also component of cervical radiculopathy.  I recommended physical therapy for dry needling and soft tissue mobilization.  I performed a scapular trigger point injection today.  I will make a referral to [name] for cervical spine ESI.  Follow-up as needed. ? ?Follow-Up Instructions: No follow-ups on file.  ? ?Orders:  ?Orders Placed This Encounter  ?Procedures  ? Ambulatory referral to Physical Therapy  ? ?No orders of the defined types were placed in this encounter. ? ? ? ? Procedures: ?Trigger Point Inj ? ?Date/Time: 07/16/2021 12:52 PM ?Performed by: [name] ?Authorized by: [name]  ? ?Consent Given by:  Patient ?Indications:  Muscle spasm and pain ?Total # of Trigger Points:  1 ?Location: shoulder   ?Needle Size:  25 G ?Approach:  Dorsal ?Medications #1:  40 mg methylPREDNISolone acetate 40 MG/ML; 2 mL bupivacaine 0.5 %; 2 mL lidocaine 1 % ?Patient tolerance:  Patient tolerated the procedure well with no immediate complications ? ? ?Clinical Data: ?No additional findings. ? ? ?Subjective: ?Chief Complaint  ?Patient presents with  ? Right Shoulder - Pain  ? ? ?HPI ? ?[name] is a 61 year old gentleman referral from Dr. Kathyrn [name] for chronic right shoulder pain.  The pain is in the posterior region of the shoulder girdle medial to the scapular border.  He has had pain for about 4 months.  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  He has been taking a lot of Excedrin to help with the pain.  He feels shooting pain through the shoulder and down the arm.  He feels that  his fingers are numb.  He had a CT myelogram recently which showed some herniated disks.  He has not done any physical therapy. ? ?Review of Systems  ?Constitutional: Negative.   ?All other systems reviewed and are negative. ? ? ?Objective: ?Vital Signs: There were no vitals taken for this visit. ? ?Physical Exam ?Vitals and nursing note reviewed.  ?Constitutional:   ?   Appearance: He is well-developed.  ?HENT:  ?   Head: Normocephalic and atraumatic.  ?Eyes:  ?   Pupils: Pupils are equal, round, and reactive to light.  ?Pulmonary:  ?   Effort: Pulmonary effort is normal.  ?Abdominal:  ?   Palpations: Abdomen is soft.  ?Musculoskeletal:     ?   General: Normal range of motion.  ?   Cervical back: Neck supple.  ?Skin: ?   General: Skin is warm.  ?Neurological:  ?   Mental Status: He is alert and oriented to person, place, and time.  ?Psychiatric:     ?   Behavior: Behavior normal.     ?   Thought Content: Thought content normal.     ?   Judgment: Judgment normal.  ? ? ?Ortho Exam ? ?Examination of the right shoulder girdle does not show any asymmetry  or masses or lesions.  He has full range of motion.  His pain localizes to the medial border of the scapula and in the rhomboid muscle group.  Manual muscle testing is normal.  No pain with testing of impingement or rotator cuff.  Positive Spurling sign. ? ?Specialty Comments:  ?No specialty comments available. ? ?Imaging: ?No results found. ? ? ?PMFS History: ?Patient Active Problem List  ? Diagnosis Date Noted  ? Diverticular disease of colon 05/02/2021  ? Gastro-esophageal reflux disease without esophagitis 05/02/2021  ? Hematuria 05/02/2021  ? Personal history of colonic polyps 05/02/2021  ? 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  ? Mixed hyperlipidemia 02/23/2017  ? Peripheral vascular disease ([place]) 06/01/2016  ? Nasolacrimal duct obstruction, acquired, right 03/11/2016  ? Medication side effects 06/28/2014  ? COPD (chronic obstructive pulmonary disease) ([place]) 05/24/2013  ?  Coronary artery disease 06/14/2012  ? Chronic, continuous use of opioids 06/14/2012  ? Fecal incontinence 05/18/2012  ? Umbilical hernia 62/69/4854  ? Increased frequency of urination 12/11/2011  ? Prostatitis 11/13/2011  ? Atherosclerosis of native arteries of the extremities with intermittent claudication 10/03/2011  ? Pain in limb 10/03/2011  ? Fecal smearing 09/26/2011  ? Acquired hindfoot varus 08/12/2011  ? Instability of ankle joint 08/12/2011  ? Painful sternal wire 06/30/2011  ? Encounter for therapeutic drug monitoring 04/06/2011  ? Sural neuritis 04/06/2011  ? Ankle pain 01/31/2011  ? Peripheral neuropathy 01/31/2011  ? Chronic pain syndrome 01/30/2011  ? Leg cramps 11/12/2010  ? Hx of Inferior MI   ? Essential hypertension   ? Dyspnea   ? Orthostatic hypotension   ? History of syncope   ? ?Past Medical History:  ?Diagnosis Date  ? Acute MI, inferior wall ([place]) 04/2009  ? Chest pain   ? COPD (chronic obstructive pulmonary disease) ([place])   ? Coronary artery disease   ? STATUS POST CABG 01/2009  ? Dyslipidemia   ? Hyperlipidemia   ? Hypertension   ? Orthostatic hypotension   ? Peripheral vascular disease ([place])   ? Syncope and collapse   ?  ?Family History  ?Problem Relation Age of Onset  ? Coronary artery disease Father 40  ? Diabetes Father   ? Hyperlipidemia Father   ? Heart disease Father   ?     before age 41  ? Hypertension Father   ? Cancer Mother   ?     uterine  ? Hyperlipidemia Mother   ? Diabetes Mother   ? Hypertension Mother   ? Hypertension Brother   ?  ?Past Surgical History:  ?Procedure Laterality Date  ? ANKLE SURGERY    ? x 2  ? COLONOSCOPY  April 2013  ? CORONARY ANGIOPLASTY    ? CORONARY ARTERY BYPASS GRAFT  2010  ? FOOT SURGERY    ? HERNIA REPAIR  01/23/12  ? umb hernia  ? Kidney stimulator    ? LEFT HEART CATH AND CORS/GRAFTS ANGIOGRAPHY N/A 06/02/2016  ? Procedure: Left Heart Cath and Cors/Grafts Angiography;  Surgeon: Lorretta Harp, [name];  Location: [facility];  Service:  Cardiovascular;  Laterality: N/A;  ? LEFT HEART CATHETERIZATION WITH CORONARY/GRAFT ANGIOGRAM N/A 07/08/2012  ? Procedure: LEFT HEART CATHETERIZATION WITH Beatrix Fetters;  Surgeon: Thayer Headings, [name];  Location: [facility] CATH LAB;  Service: Cardiovascular;  Laterality: N/A;  ? PR VEIN BYPASS GRAFT,AORTO-FEM-POP    ? STERNAL WIRES REMOVAL  07/08/2011  ? Procedure: STERNAL WIRES REMOVAL;  Surgeon: Grace Isaac, [name];  Location: [facility];  Service: Open Heart Surgery;  Laterality: N/A;  removal of 4th wire  ? ?Social History  ? ?Occupational History  ? Occupation: Disability  ?Tobacco Use  ? Smoking status: Former  ?  Packs/day: 3.00  ?  Years: 30.00  ?  Pack years: 90.00  ?  Types: Cigarettes, Cigars  ?  Quit date: 01/26/2009  ?  Years since quitting: 12.4  ? Smokeless tobacco: Never  ?Vaping Use  ? Vaping Use: Never used  ?Substance and Sexual Activity  ? Alcohol use: No  ? Drug use: No  ? Sexual activity: Not Currently  ? ? ? ? ? ? ?

## 2021-07-17 ENCOUNTER — Other Ambulatory Visit: Payer: Self-pay | Admitting: Physical Medicine and Rehabilitation

## 2021-07-20 ENCOUNTER — Other Ambulatory Visit: Payer: Self-pay | Admitting: Family Medicine

## 2021-07-21 ENCOUNTER — Encounter: Payer: Self-pay | Admitting: Orthopaedic Surgery

## 2021-07-29 ENCOUNTER — Ambulatory Visit: Payer: Medicare HMO | Admitting: Physical Medicine and Rehabilitation

## 2021-07-29 ENCOUNTER — Encounter: Payer: Self-pay | Admitting: Physical Medicine and Rehabilitation

## 2021-07-29 DIAGNOSIS — M7918 Myalgia, other site: Secondary | ICD-10-CM

## 2021-07-29 DIAGNOSIS — M5412 Radiculopathy, cervical region: Secondary | ICD-10-CM

## 2021-07-29 DIAGNOSIS — R202 Paresthesia of skin: Secondary | ICD-10-CM

## 2021-07-29 DIAGNOSIS — M542 Cervicalgia: Secondary | ICD-10-CM

## 2021-07-29 DIAGNOSIS — M4802 Spinal stenosis, cervical region: Secondary | ICD-10-CM

## 2021-07-29 NOTE — Progress Notes (Signed)
Pt state neck pain that travels to his right shoulder blade than to his right forearm. Pt state he has numbness in his right hand and fingers. Pt state bending makes the pain worse. Pt state he takes over the counter pain meds to help ease. ? ?Numeric Pain Rating Scale and Functional Assessment ?Average Pain 10 ?Pain Right Now 10 ?My pain is constant, sharp, burning, stabbing, tingling, and aching ?Pain is worse with: bending ?Pain improves with: medication ? ? ?In the last MONTH (on 0-10 scale) has pain interfered with the following? ? ?1. General activity like being  able to carry out your everyday physical activities such as walking, climbing stairs, carrying groceries, or moving a chair?  ?Rating(6) ? ?2. Relation with others like being able to carry out your usual social activities and roles such as  activities at home, at work and in your community. ?Rating(7) ? ?3. Enjoyment of life such that you have  been bothered by emotional problems such as feeling anxious, depressed or irritable?  ?Rating(8) ? ?

## 2021-07-29 NOTE — Progress Notes (Signed)
? ?KHUP SAPIA - 61 y.o. male MRN [id number]  Date of birth: 12/10/1960 ? ?Office Visit Note: ?Visit Date: 07/29/2021 ?PCP: Susy Frizzle, MD ?Referred by: Susy Frizzle, MD ? ?Subjective: ?Chief Complaint  ?Patient presents with  ? Neck - Pain  ? Right Shoulder - Pain  ? Right Elbow - Pain  ? Right Hand - Pain  ? ?HPI: [name] is a 61 y.o. male who comes in today at the request of Dr. Eduard Roux for evaluation of chronic, worsening and severe midline neck pain radiating to right scapular region and down right arm to hands.  He describes the pain as starting around the scapular border and referring into the arm and not so much from the neck to the arm.  Patient reports pain has been ongoing for several months and is exacerbated by bending over, movement and activity. He describes pain as sore, aching and numbness sensation, currently rates as 8 out of 10.  Pain and paresthesia distribution somewhat nondermatomal.  Could be more C6.  Patient reports some relief of pain with rest and use of medications.  He was originally seen by Dr. Larena Glassman. Patient states he recently attended occupational therapy at [facility] and reports no relief of pain with these treatments. Patient is scheduled to begin formal physical therapy on 07/30/2021 at same facility. Patient currently taking 800 mg of Gabapentin three times a day. Patients cervical CT myelogram exhibits small uncovertebral spurs minimally encroaching upon the neural foramina at the level of C3-C4, and biforaminal stenosis, worse on the left at C6-C7. No high grade spinal canal stenosis noted.  Patient recently had consult with Dr. Consuella Lose at [facility], per his notes did not recommend any surgical intervention at this time, he was concerned this could be an intrinsic right shoulder issue.  He was then referred to Dr. Erlinda Hong.  Dr. Erlinda Hong did perform scapular trigger point injection on 07/16/2021 and  patient reports no relief of pain with this procedure. Patient did have InterStim placed in 2013 by Dr. Alona Bene due to issues with nocturia and fecal incontinence, however he is having device removed soon because he does not feel it is beneficial at this point. Patient states he is very frustrated and tired of seeing multiple providers for this chronic neck issues, patient states he feels that treatments aren't helping him and would like to try cervical injection. Patient states he has been off Plavix for approximately 10 days. Patient denies focal weakness, numbness and tingling. Patient denies recent trauma or falls.  ? ?Review of Systems  ?Musculoskeletal:  Positive for myalgias and neck pain.  ?Neurological:  Positive for tingling. Negative for sensory change, focal weakness and weakness.  ?All other systems reviewed and are negative. Otherwise per HPI. ? ?Assessment & Plan: ?Visit Diagnoses:  ?  ICD-10-CM   ?1. Radiculopathy, cervical region  M54.12 DG INJECT DIAG/THERA/INC NEEDLE/CATH/PLC EPI/CERV/THOR W/IMG  ?  ?2. Cervicalgia  M54.2 DG INJECT DIAG/THERA/INC NEEDLE/CATH/PLC EPI/CERV/THOR W/IMG  ?  ?3. Myofascial pain syndrome  M79.18 Trigger Point Inj  ?  ?4. Foraminal stenosis of cervical region  M48.02   ?  ?5. Paresthesia of skin  R20.2   ?  ?   ?Plan: Findings:  ?Chronic, worsening and severe midline neck pain radiating to right scapular region and down right arm to hands.  Patient continues to have severe pain despite good conservative therapies such as occupational therapy, rest and use of  medications.  Patient's clinical presentation and exam are consistent with a combination of myofascial trigger point and C5/C6 nerve pattern.   He does have a palpable trigger point noted to right rhomboid region.  This did seem to elicit fairly concordant symptoms.  We did perform trigger point injection in the office today, needling technique used, patient tolerated without difficulty.  We believe the next step  is to also perform a diagnostic and hopefully therapeutic right C7-T1 interlaminar epidural steroid injection, patient has currently discontinued Plavix.  Patient voiced concerns that he would like to get in quickly for his injection, I did place an order for his injection to be performed at [facility] and informed patient that this facility will call him to schedule.  Patient instructed to let us know if his pain does subside following trigger point injection, we would then cancel referral for cervical epidural injection.  If a lot of his neck and shoulder pain are relieved with the trigger point injection but he still has paresthesia in the hand I would consider electrodiagnostic study to look at carpal tunnel syndrome.  Patient encouraged to attend physical therapy as tolerated.  No red flag symptoms noted upon exam today.  ? ?Meds & Orders: No orders of the defined types were placed in this encounter. ?  ?Orders Placed This Encounter  ?Procedures  ? Trigger Point Inj  ? DG INJECT DIAG/THERA/INC NEEDLE/CATH/PLC EPI/CERV/THOR W/IMG  ?  ?Follow-up: Return for Right C7-T1 interlaminar epidural steroid injection.  ? ?Procedures: ?Trigger Point Inj ? ?Date/Time: 07/29/2021 4:59 PM ?Performed by: Magnus Sinning, MD ?Authorized by: Magnus Sinning, MD  ? ?Consent Given by:  Patient ?Site marked: the procedure site was marked   ?Timeout: prior to procedure the correct patient, procedure, and site was verified   ?Indications:  Muscle spasm and pain ?Total # of Trigger Points:  1 ?Location: shoulder   ?Location comment:  Right rhomboid ?Needle Size:  22 G ?Medications #1:  5 mL lidocaine 1 %; 40 mg triamcinolone acetonide 40 MG/ML; 30 mg ketorolac 30 MG/ML ?Comments: Needling technique utilized, patient tolerated without difficulty.  ?   ? ?Clinical History: ?CERVICAL MYELOGRAM ?  ?CT CERVICAL SPINE WITH INTRATHECAL CONTRAST ?  ?TECHNIQUE: ?An appropriate entry site was determined under fluoroscopy.  Operator ?donned sterile gloves and mask. Skin site was marked,prepped with ?Betadine, and draped in usual sterile fashion, and infiltrated ?locally with 1% lidocaine. A 22 gauge spinal needle was advanced ?into the thecal sac at L3 from a left parasagittal approach. Clear ?colorless CSF returned. 10 ml Omnipaque 300 were administered ?intrathecally for cervical myelography, followed by axial CT ?scanning of the cervical spine. I personally performed the lumbar ?puncture and administered the intrathecal contrast. I also ?personally supervised acquisition of the myelogram images. Coronal ?and sagittal reconstructions were generated. ?  ?RADIATION DOSE REDUCTION: This exam was performed according to the ?departmental dose-optimization program which includes automated ?exposure control, adjustment of the mA and/or kV according to ?patient size and/or use of iterative reconstruction technique. ?  ?FINDINGS: ?Normal alignment.  No fracture or worrisome bone lesion. ?  ?C2-3: Interspace unremarkable.  Central canal and foramina patent. ?  ?C3-4: Mild disc bulge. Small uncovertebral spurs minimally encroach ?upon the neural foramina. No spinal stenosis. ?  ?C4-5: Mild broad posterior disc bulge with associated endplate ?osteophytes. No cord flattening or distortion. No significant spinal ?stenosis. Foramina patent. ?  ?C5-6: No significant disc bulge or protrusion. No spinal stenosis. ?Anterior endplate spurring. Foramina patent. ?  ?  C6-7: Mild narrowing of the interspace. Mild circumferential disc ?bulge with small endplate osteophytes. Uncovertebral spurs encroach ?upon neural foramina, left worse than right. ?  ?C7-T1: Minimal disc bulge. No cord distortion. No spinal stenosis. ?Foramina patent. ?  ?Aortic, carotid, and vertebral calcified atheromatous plaque. ?Visualized lung apices clear. No prevertebral soft tissue swelling ?or gas. Remainder of paraspinal soft tissues unremarkable. ?  ?IMPRESSION: ?1. Early  foraminal encroachment bilaterally C3-4 secondary to ?uncovertebral spurring. ?2. Foraminal stenosis left greater than right C6-7 secondary to disc ?bulge and uncovertebral spurring. ?3. Disc bulges C4-5, C5-6, C7-T1 wit

## 2021-07-30 ENCOUNTER — Ambulatory Visit (HOSPITAL_COMMUNITY): Payer: Medicare HMO | Attending: Orthopedic Surgery | Admitting: Physical Therapy

## 2021-07-30 ENCOUNTER — Encounter (HOSPITAL_COMMUNITY): Payer: Self-pay | Admitting: Physical Therapy

## 2021-07-30 DIAGNOSIS — M25511 Pain in right shoulder: Secondary | ICD-10-CM | POA: Diagnosis not present

## 2021-07-30 DIAGNOSIS — G8929 Other chronic pain: Secondary | ICD-10-CM | POA: Diagnosis not present

## 2021-07-30 DIAGNOSIS — R29898 Other symptoms and signs involving the musculoskeletal system: Secondary | ICD-10-CM | POA: Diagnosis not present

## 2021-07-30 DIAGNOSIS — M542 Cervicalgia: Secondary | ICD-10-CM | POA: Insufficient documentation

## 2021-07-30 MED ORDER — KETOROLAC TROMETHAMINE 30 MG/ML IJ SOLN
30.0000 mg | INTRAMUSCULAR | Status: AC | PRN
  Administered 2021-07-29: 30 mg via INTRA_ARTICULAR

## 2021-07-30 MED ORDER — LIDOCAINE HCL 1 % IJ SOLN
5.0000 mL | INTRAMUSCULAR | Status: AC | PRN
  Administered 2021-07-29: 5 mL

## 2021-07-30 MED ORDER — TRIAMCINOLONE ACETONIDE 40 MG/ML IJ SUSP
40.0000 mg | INTRAMUSCULAR | Status: AC | PRN
  Administered 2021-07-29: 40 mg via INTRAMUSCULAR

## 2021-07-30 NOTE — Therapy (Signed)
?OUTPATIENT PHYSICAL THERAPY SHOULDER EVALUATION ? ? ?Patient Name: [name] ?MRN: [id number] ?DOB:09-22-1960, 61 y.o., male ?Today's Date: 07/30/2021 ? ? PT End of Session - 07/30/21 0739   ? ? Visit Number 1   ? Number of Visits 12   ? Date for PT Re-Evaluation 09/10/21   ? Authorization Type [employer]   ? Authorization Time Period 12 visits requested 4/4-5/16 - check auth   ? Progress Note Due on Visit 10   ? PT Start Time 0745   ? PT Stop Time [phone]   ? PT Time Calculation (min) 43 min   ? Activity Tolerance Patient tolerated treatment well   ? Behavior During Therapy [facility] for tasks assessed/performed   ? ?  ?  ? ?  ? ? ?Past Medical History:  ?Diagnosis Date  ? Acute MI, inferior wall ([place]) 04/2009  ? Chest pain   ? COPD (chronic obstructive pulmonary disease) ([place])   ? Coronary artery disease   ? STATUS POST CABG 01/2009  ? Dyslipidemia   ? Hyperlipidemia   ? Hypertension   ? Orthostatic hypotension   ? Peripheral vascular disease ([place])   ? Syncope and collapse   ? ?Past Surgical History:  ?Procedure Laterality Date  ? ANKLE SURGERY    ? x 2  ? COLONOSCOPY  April 2013  ? CORONARY ANGIOPLASTY    ? CORONARY ARTERY BYPASS GRAFT  2010  ? FOOT SURGERY    ? HERNIA REPAIR  01/23/12  ? umb hernia  ? Kidney stimulator    ? LEFT HEART CATH AND CORS/GRAFTS ANGIOGRAPHY N/A 06/02/2016  ? Procedure: Left Heart Cath and Cors/Grafts Angiography;  Surgeon: Lorretta Harp, MD;  Location: [place] [facility];  Service: Cardiovascular;  Laterality: N/A;  ? LEFT HEART CATHETERIZATION WITH CORONARY/GRAFT ANGIOGRAM N/A 07/08/2012  ? Procedure: LEFT HEART CATHETERIZATION WITH Beatrix Fetters;  Surgeon: Thayer Headings, MD;  Location: [facility];  Service: Cardiovascular;  Laterality: N/A;  ? PR VEIN BYPASS GRAFT,AORTO-FEM-POP    ? STERNAL WIRES REMOVAL  07/08/2011  ? Procedure: STERNAL WIRES REMOVAL;  Surgeon: Grace Isaac, MD;  Location: [place];  Service: Open Heart Surgery;  Laterality: N/A;  removal of 4th wire   ? ?Patient Active Problem List  ? Diagnosis Date Noted  ? Diverticular disease of colon 05/02/2021  ? Gastro-esophageal reflux disease without esophagitis 05/02/2021  ? Hematuria 05/02/2021  ? Personal history of colonic polyps 05/02/2021  ? 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  ? Mixed hyperlipidemia 02/23/2017  ? Peripheral vascular disease ([place]) 06/01/2016  ? Nasolacrimal duct obstruction, acquired, right 03/11/2016  ? Medication side effects 06/28/2014  ? COPD (chronic obstructive pulmonary disease) ([place]) 05/24/2013  ? Coronary artery disease 06/14/2012  ? Chronic, continuous use of opioids 06/14/2012  ? Fecal incontinence 05/18/2012  ? Umbilical hernia 93/73/4287  ? Increased frequency of urination 12/11/2011  ? Prostatitis 11/13/2011  ? Atherosclerosis of native arteries of the extremities with intermittent claudication 10/03/2011  ? Pain in limb 10/03/2011  ? Fecal smearing 09/26/2011  ? Acquired hindfoot varus 08/12/2011  ? Instability of ankle joint 08/12/2011  ? Painful sternal wire 06/30/2011  ? Encounter for therapeutic drug monitoring 04/06/2011  ? Sural neuritis 04/06/2011  ? Ankle pain 01/31/2011  ? Peripheral neuropathy 01/31/2011  ? Chronic pain syndrome 01/30/2011  ? Leg cramps 11/12/2010  ? Hx of Inferior MI   ? Essential hypertension   ? Dyspnea   ? Orthostatic hypotension   ? History of syncope   ? ? ?  PCP: Susy Frizzle, MD ? ?REFERRING PROVIDER: Leandrew Koyanagi, MD ? ?REFERRING DIAG: M25.511,G89.29 (ICD-10-CM) - Chronic right shoulder pain  ? ?THERAPY DIAG:  ?Chronic right shoulder pain ? ?Cervicalgia ? ?Other symptoms and signs involving the musculoskeletal system ? ? ?ONSET DATE: about 4 months ago ? ?SUBJECTIVE:                                                                                                                                                                                     ? ?SUBJECTIVE STATEMENT: ?He had an injection in his shoulder last night and it helped last night but its starting  to come back. He has shoulder pain in the shoulder blade all the time. Has not found anything that decreases pain. States pain starts in shoulder blade and runs down R arm to fingers. States increased pain with turning head to right.  ? ?PERTINENT HISTORY: ?Neck pain ? ?PAIN:  ?Are you having pain? Yes: NPRS scale: 8/10 ?Pain location: R shoulder blade  ?Pain description: sharp, stabbing ?Aggravating factors: movement, constant ?Relieving factors: none ? ?PRECAUTIONS: None ? ?WEIGHT BEARING RESTRICTIONS No ? ?FALLS:  ?Has patient fallen in last 6 months? No ? ?LIVING ENVIRONMENT: ?Lives with: lives with their spouse ?Lives in: House/apartment ?Stairs: No ?Has following equipment at home: Single point cane, Walker - 2 wheeled, Wheelchair (manual), and Ramped entry ? ?OCCUPATION: ?unemployed ? ?PLOF: Independent ? ?PATIENT GOALS Decrease pain ? ?OBJECTIVE:  ? ?DIAGNOSTIC FINDINGS:  ?CT 06/28/21 IMPRESSION: ?1. Early foraminal encroachment bilaterally C3-4 secondary to ?uncovertebral spurring. ?2. Foraminal stenosis left greater than right C6-7 secondary to disc ?bulge and uncovertebral spurring. ?3. Disc bulges C4-5, C5-6, C7-T1 without compressive pathology. ?4.  Aortic Atherosclerosis (ICD10-170.0). ? ?PATIENT SURVEYS:  ?FOTO 55% function ? ?COGNITION: ? Overall cognitive status: Within functional limits for tasks assessed ?    ?SENSATION: ?WFL ? ?POSTURE: ?Slouched in seated  ? ? Cervical AROM:  ?  07/30/21    ? Flexion  25% limited * symptoms to elbow    ? Extension  25% limited * symptoms to shoulder    ? R ROT  25% limited * symptoms to shoulder    ? L ROT  0% limited    ? R SB  25% limited * symptoms to shoulder blade    ?L SB 25% limited   ?  * Pain   ?(Blank rows = not tested) ? ?UPPER EXTREMITY ROM:  ? ?Active ROM Right ?07/30/2021 Left ?07/30/2021  ?Shoulder flexion 105 128  ?Shoulder extension    ?Shoulder abduction 105 125  ?Shoulder adduction    ?Shoulder internal rotation    ?Shoulder external rotation     ?  Elbow flexion    ?Elbow extension    ?Wrist flexion    ?Wrist extension    ?Wrist ulnar deviation    ?Wrist radial deviation    ?Wrist pronation    ?Wrist supination    ?(Blank rows = not tested) ? ?UPPER EXTREMITY MMT: ? ?MMT Right ?07/30/2021 Left ?07/30/2021  ?Shoulder flexion 4+/5 5/5  ?Shoulder extension    ?Shoulder abduction 4+/5 5/5  ?Shoulder adduction    ?Shoulder internal rotation 4+/5 5/5  ?Shoulder external rotation 4+/5 4+/5  ?Middle trapezius    ?Lower trapezius    ?Elbow flexion 5/5 5/5  ?Elbow extension 5/5 5/5  ?Wrist flexion    ?Wrist extension    ?Wrist ulnar deviation    ?Wrist radial deviation    ?Wrist pronation    ?Wrist supination    ?Grip strength (lbs)    ?(Blank rows = not tested) ? ?Special test: ?Cervical Distraction: decreases radicular symptoms ? ?JOINT MOBILITY TESTING:  ?Hypomobile cervical spine- no reproduction of symptoms but tender ?R 1st rib: WFL - numbness in hand ? ?PALPATION:  ?TTP R infraspinatus, supraspinatus, rhomboid major/minor ?  ?TODAY'S TREATMENT:  ?07/30/21 ?Cervical retractions in supine 2x 10  ?Self STM with tennis ball 5 minutes in standing ? ? ?PATIENT EDUCATION: ?Education details: Patient educated on exam findings, POC, scope of PT, HEP, and DN. ?Person educated: Patient ?Education method: Explanation, Demonstration, and Handouts ?Education comprehension: verbalized understanding, returned demonstration, verbal cues required, and tactile cues required ? ? ? ?HOME EXERCISE PROGRAM: ?Cervical retractions in supine 2x 10  ?Self STM with tennis ball 5 minutes in standing ? ?ASSESSMENT: ? ?CLINICAL IMPRESSION: ?Patient a 61 y.o. y.o. male who was seen today for physical therapy evaluation and treatment for R shoulder pain and neck pain. Patient with peripherilization with cervical ROM but also with palpation of R shoulder musculature. Patient will continue to benefit from physical therapy in order to improve function and reduce impairment. ?   ? ? ?OBJECTIVE  IMPAIRMENTS decreased activity tolerance, decreased mobility, decreased ROM, decreased strength, increased muscle spasms, impaired flexibility, impaired UE functional use, improper body mechanics, postural dysfunction,

## 2021-07-30 NOTE — Patient Instructions (Addendum)
Trigger Point Dry Needling ? ?What is Trigger Point Dry Needling (DN)? ?DN is a physical therapy technique used to treat muscle pain and dysfunction. Specifically, DN helps deactivate muscle trigger points (muscle knots).  ?A thin filiform needle is used to penetrate the skin and stimulate the underlying trigger point. The goal is for a local twitch response (LTR) to occur and for the trigger point to relax. No medication of any kind is injected during the procedure.  ? ?What Does Trigger Point Dry Needling Feel Like?  ?The procedure feels different for each individual patient. Some patients report that they do not actually feel the needle enter the skin and overall the process is not painful. Very mild bleeding may occur. However, many patients feel a deep cramping in the muscle in which the needle was inserted. This is the local twitch response.  ? ?How Will I feel after the treatment? ?Soreness is normal, and the onset of soreness may not occur for a few hours. Typically this soreness does not last longer than two days.  ?Bruising is uncommon, however; ice can be used to decrease any possible bruising.  ?In rare cases feeling tired or nauseous after the treatment is normal. In addition, your symptoms may get worse before they get better, this period will typically not last longer than 24 hours.  ? ?What Can I do After My Treatment? ?Increase your hydration by drinking more water for the next 24 hours. ?You may place ice or heat on the areas treated that have become sore, however, do not use heat on inflamed or bruised areas. Heat often brings more relief post needling. ?You can continue your regular activities, but vigorous activity is not recommended initially after the treatment for 24 hours. ?DN is best combined with other physical therapy such as strengthening, stretching, and other therapies.   ? ?Access Code: [email or web address] ?URL: [email or web address] ?Date: 07/30/2021 ?Prepared by: Mitzi Hansen  Pilar Westergaard ? ?Exercises ?- Supine Chin Tuck  - 5 x daily - 7 x weekly - 2 sets - 10 reps - 2-3 second hold ?

## 2021-08-01 ENCOUNTER — Telehealth: Payer: Self-pay | Admitting: *Deleted

## 2021-08-01 ENCOUNTER — Encounter (HOSPITAL_COMMUNITY): Payer: Self-pay | Admitting: Physical Therapy

## 2021-08-01 ENCOUNTER — Ambulatory Visit (HOSPITAL_COMMUNITY): Payer: Medicare HMO | Admitting: Physical Therapy

## 2021-08-01 DIAGNOSIS — G8929 Other chronic pain: Secondary | ICD-10-CM | POA: Diagnosis not present

## 2021-08-01 DIAGNOSIS — R29898 Other symptoms and signs involving the musculoskeletal system: Secondary | ICD-10-CM | POA: Diagnosis not present

## 2021-08-01 DIAGNOSIS — M542 Cervicalgia: Secondary | ICD-10-CM | POA: Diagnosis not present

## 2021-08-01 DIAGNOSIS — M25511 Pain in right shoulder: Secondary | ICD-10-CM | POA: Diagnosis not present

## 2021-08-01 NOTE — Telephone Encounter (Signed)
Pt had the Cervical ESI with [facility] on 08/02/21.  ?

## 2021-08-01 NOTE — Therapy (Signed)
?OUTPATIENT PHYSICAL THERAPY TREATMENT NOTE ? ? ?Patient Name: [name] ?MRN: [id number] ?DOB:1960/05/03, 61 y.o., male ?Today's Date: 08/01/2021 ? ?PCP: Susy Frizzle, MD ?REFERRING PROVIDER: Susy Frizzle, MD ? ?END OF SESSION:  ? PT End of Session - 08/01/21 1433   ? ? Visit Number 2   ? Number of Visits 12   ? Date for PT Re-Evaluation 09/10/21   ? Authorization Type [employer]   ? Authorization Time Period 12 visits requested 4/4-5/16 - check auth (pending)   ? Progress Note Due on Visit 10   ? PT Start Time 1435   ? PT Stop Time 1520   ? PT Time Calculation (min) 45 min   ? Activity Tolerance Patient limited by pain   ? Behavior During Therapy [facility] for tasks assessed/performed   ? ?  ?  ? ?  ? ? ?Past Medical History:  ?Diagnosis Date  ? Acute MI, inferior wall ([place]) 04/2009  ? Chest pain   ? COPD (chronic obstructive pulmonary disease) ([place])   ? Coronary artery disease   ? STATUS POST CABG 01/2009  ? Dyslipidemia   ? Hyperlipidemia   ? Hypertension   ? Orthostatic hypotension   ? Peripheral vascular disease ([place])   ? Syncope and collapse   ? ?Past Surgical History:  ?Procedure Laterality Date  ? ANKLE SURGERY    ? x 2  ? COLONOSCOPY  April 2013  ? CORONARY ANGIOPLASTY    ? CORONARY ARTERY BYPASS GRAFT  2010  ? FOOT SURGERY    ? HERNIA REPAIR  01/23/12  ? umb hernia  ? Kidney stimulator    ? LEFT HEART CATH AND CORS/GRAFTS ANGIOGRAPHY N/A 06/02/2016  ? Procedure: Left Heart Cath and Cors/Grafts Angiography;  Surgeon: Lorretta Harp, MD;  Location: [place] [facility];  Service: Cardiovascular;  Laterality: N/A;  ? LEFT HEART CATHETERIZATION WITH CORONARY/GRAFT ANGIOGRAM N/A 07/08/2012  ? Procedure: LEFT HEART CATHETERIZATION WITH Beatrix Fetters;  Surgeon: Thayer Headings, MD;  Location: [facility] CATH LAB;  Service: Cardiovascular;  Laterality: N/A;  ? PR VEIN BYPASS GRAFT,AORTO-FEM-POP    ? STERNAL WIRES REMOVAL  07/08/2011  ? Procedure: STERNAL WIRES REMOVAL;  Surgeon: Grace Isaac, MD;   Location: [place];  Service: Open Heart Surgery;  Laterality: N/A;  removal of 4th wire  ? ?Patient Active Problem List  ? Diagnosis Date Noted  ? Diverticular disease of colon 05/02/2021  ? Gastro-esophageal reflux disease without esophagitis 05/02/2021  ? Hematuria 05/02/2021  ? Personal history of colonic polyps 05/02/2021  ? 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  ? Mixed hyperlipidemia 02/23/2017  ? Peripheral vascular disease ([place]) 06/01/2016  ? Nasolacrimal duct obstruction, acquired, right 03/11/2016  ? Medication side effects 06/28/2014  ? COPD (chronic obstructive pulmonary disease) ([place]) 05/24/2013  ? Coronary artery disease 06/14/2012  ? Chronic, continuous use of opioids 06/14/2012  ? Fecal incontinence 05/18/2012  ? Umbilical hernia 78/67/6720  ? Increased frequency of urination 12/11/2011  ? Prostatitis 11/13/2011  ? Atherosclerosis of native arteries of the extremities with intermittent claudication 10/03/2011  ? Pain in limb 10/03/2011  ? Fecal smearing 09/26/2011  ? Acquired hindfoot varus 08/12/2011  ? Instability of ankle joint 08/12/2011  ? Painful sternal wire 06/30/2011  ? Encounter for therapeutic drug monitoring 04/06/2011  ? Sural neuritis 04/06/2011  ? Ankle pain 01/31/2011  ? Peripheral neuropathy 01/31/2011  ? Chronic pain syndrome 01/30/2011  ? Leg cramps 11/12/2010  ? Hx of Inferior MI   ? Essential  hypertension   ? Dyspnea   ? Orthostatic hypotension   ? History of syncope   ? ? ?REFERRING DIAG: M25.511,G89.29 (ICD-10-CM) - Chronic right shoulder pain  ? ?THERAPY DIAG:  ?Chronic right shoulder pain ? ?Cervicalgia ? ?Other symptoms and signs involving the musculoskeletal system ? ?PERTINENT HISTORY: Neck pain ? ?PRECAUTIONS: None ? ?SUBJECTIVE:  Patient reports compliance with HEP. "They ain't doing no good". He is ready for dry needling today.  ? ?PAIN:  ?Are you having pain? Yes: NPRS scale: 9/10 ?Pain location: RT scapular pain  ?Pain description: sharp, stabbing  ?Aggravating factors: "just there"   ?Relieving factors: holding hand overhead  ? ?OBJECTIVE:  ?  ?DIAGNOSTIC FINDINGS:  ?CT 06/28/21 IMPRESSION: ?1. Early foraminal encroachment bilaterally C3-4 secondary to ?uncovertebral spurring. ?2. Foraminal stenosis left greater than right C6-7 secondary to disc ?bulge and uncovertebral spurring. ?3. Disc bulges C4-5, C5-6, C7-T1 without compressive pathology. ?4.  Aortic Atherosclerosis (ICD10-170.0). ?  ?PATIENT SURVEYS:  ?FOTO 55% function ?  ?COGNITION: ?          Overall cognitive status: Within functional limits for tasks assessed ?                                 ?SENSATION: ?WFL ?  ?POSTURE: ?Slouched in seated  ?  ? Cervical AROM:  ?  07/30/21    ? Flexion  25% limited * symptoms to elbow    ? Extension  25% limited * symptoms to shoulder    ? R ROT  25% limited * symptoms to shoulder    ? L ROT  0% limited    ? R SB  25% limited * symptoms to shoulder blade    ?L SB 25% limited    ?                      * Pain              ?(Blank rows = not tested) ?  ?UPPER EXTREMITY ROM:  ?  ?Active ROM Right ?07/30/2021 Left ?07/30/2021  ?Shoulder flexion 105 128  ?Shoulder extension      ?Shoulder abduction 105 125  ?Shoulder adduction      ?Shoulder internal rotation      ?Shoulder external rotation      ?Elbow flexion      ?Elbow extension      ?Wrist flexion      ?Wrist extension      ?Wrist ulnar deviation      ?Wrist radial deviation      ?Wrist pronation      ?Wrist supination      ?(Blank rows = not tested) ?  ?UPPER EXTREMITY MMT: ?  ?MMT Right ?07/30/2021 Left ?07/30/2021  ?Shoulder flexion 4+/5 5/5  ?Shoulder extension      ?Shoulder abduction 4+/5 5/5  ?Shoulder adduction      ?Shoulder internal rotation 4+/5 5/5  ?Shoulder external rotation 4+/5 4+/5  ?Middle trapezius      ?Lower trapezius      ?Elbow flexion 5/5 5/5  ?Elbow extension 5/5 5/5  ?Wrist flexion      ?Wrist extension      ?Wrist ulnar deviation      ?Wrist radial deviation      ?Wrist pronation      ?Wrist supination      ?Grip strength (lbs)       ?(Blank  rows = not tested) ?  ?Special test: ?Cervical Distraction: decreases radicular symptoms ?  ?JOINT MOBILITY TESTING:  ?Hypomobile cervical spine- no reproduction of symptoms but tender ?R 1st rib: WFL - numbness in hand ?  ?PALPATION:  ?TTP R infraspinatus, supraspinatus, rhomboid major/minor ?            ?TODAY'S TREATMENT:  ?08/01/21 ?DRY NEEDLING: ?Dry needling consent given? yes ?Educational handouts provided? yes ?Muscles treated: RT rhomboid, levator,upper trapezius ?Response from dry needling: good tolerance, no twitches, notes reduction of pain from 9/10 to 5/10 ? ?Manual STM to RT rhomboid, upper trap and levator pre and post dry needling for trigger point identification and surface area preparation  ? ?Chin tuck x10 ? Chin tuck with extension x10 (worsened) ?Chin tuck with RT rotation x10 (worsened) ?Chin tuck with flexion (no effect)  ?Chin tuck at wall x 10 (no effect) ? ?07/30/21 ?Cervical retractions in supine 2x 10  ?Self STM with tennis ball 5 minutes in standing ?  ?  ?PATIENT EDUCATION: ?Education details: Patient educated on exam findings, POC, scope of PT, HEP, and DN. ?Person educated: Patient ?Education method: Explanation, Demonstration, and Handouts ?Education comprehension: verbalized understanding, returned demonstration, verbal cues required, and tactile cues required ?  ?  ?  ?HOME EXERCISE PROGRAM: ?Access Code: [email or web address] ?URL: [email or web address] ?Date: 08/01/2021 ?Prepared by: Josue Hector ? ?Exercises ?- Seated Scapular Retraction  - 3 x daily - 7 x weekly - 2 sets - 10 reps - 3-5 second hold ?- Standing Isometric Cervical Retraction with Chin Tucks and Ball at [employer]  - 3 x daily - 7 x weekly - 2 sets - 10 reps - 3-5 second holdCervical retractions in supine 2x 10  ?Self STM with tennis ball 5 minutes in standing ?  ?ASSESSMENT: ?  ?CLINICAL IMPRESSION: ?Assessed patient and performed dry needling. Was well tolerated and noted temporarily relief of pain to 5/10.  Pain immediately returns when patient is seated. Made worse with cervical extension, no effect with chin tuck and chin tucks against wall. Educated patient on likelihood that this is a cervical pathology as

## 2021-08-01 NOTE — Telephone Encounter (Signed)
I called Cathy with [facility] left message informing her of order. She will review and contact pt and scheduled. ?

## 2021-08-02 ENCOUNTER — Ambulatory Visit
Admission: RE | Admit: 2021-08-02 | Discharge: 2021-08-02 | Disposition: A | Discharge status: Home / Self Care | Payer: Medicare HMO | Source: Ambulatory Visit | Attending: Physical Medicine and Rehabilitation | Admitting: Physical Medicine and Rehabilitation

## 2021-08-02 DIAGNOSIS — M4727 Other spondylosis with radiculopathy, lumbosacral region: Secondary | ICD-10-CM | POA: Diagnosis not present

## 2021-08-02 MED ORDER — TRIAMCINOLONE ACETONIDE 40 MG/ML IJ SUSP (RADIOLOGY)
60.0000 mg | Freq: Once | INTRAMUSCULAR | Status: AC
  Administered 2021-08-02: 60 mg via EPIDURAL

## 2021-08-02 MED ORDER — IOPAMIDOL (ISOVUE-M 300) INJECTION 61%
1.0000 mL | Freq: Once | INTRAMUSCULAR | Status: AC | PRN
  Administered 2021-08-02: 1 mL via EPIDURAL

## 2021-08-02 NOTE — Discharge Instructions (Signed)
Post Procedure Spinal Discharge Instruction Sheet ? ?You may resume a regular diet and any medications that you routinely take (including pain medications) unless otherwise noted by MD. ? ?No driving day of procedure. ? ?Light activity throughout the rest of the day.  Do not do any strenuous work, exercise, bending or lifting.  The day following the procedure, you can resume normal physical activity but you should refrain from exercising or physical therapy for at least three days thereafter. ? ?You may apply ice to the injection site, 20 minutes on, 20 minutes off, as needed. Do not apply ice directly to skin.  ? ? ?Common Side Effects: ? ?Headaches- take your usual medications as directed by your physician.  Increase your fluid intake.  Caffeinated beverages may be helpful.  Lie flat in bed until your headache resolves. ? ?Restlessness or inability to sleep- you may have trouble sleeping for the next few days.  Ask your referring physician if you need any medication for sleep. ? ?Facial flushing or redness- should subside within a few days. ? ?Increased pain- a temporary increase in pain a day or two following your procedure is not unusual.  Take your pain medication as prescribed by your referring physician. ? ?Leg cramps ? ?Please contact our office at [phone] for the following symptoms: ?Fever greater than 100 degrees. ?Headaches unresolved with medication after 2-3 days. ?Increased swelling, pain, or redness at injection site. ? ? ?Thank you for visiting [facility] today. ? ?MAY RESUME ASPIRING AFTER THE PROCEDURE!   ?

## 2021-08-07 ENCOUNTER — Encounter (HOSPITAL_COMMUNITY): Payer: Self-pay | Admitting: Physical Therapy

## 2021-08-07 ENCOUNTER — Ambulatory Visit (HOSPITAL_COMMUNITY): Payer: Medicare HMO | Admitting: Physical Therapy

## 2021-08-07 DIAGNOSIS — R29898 Other symptoms and signs involving the musculoskeletal system: Secondary | ICD-10-CM

## 2021-08-07 DIAGNOSIS — M542 Cervicalgia: Secondary | ICD-10-CM | POA: Diagnosis not present

## 2021-08-07 DIAGNOSIS — G8929 Other chronic pain: Secondary | ICD-10-CM | POA: Diagnosis not present

## 2021-08-07 DIAGNOSIS — M25511 Pain in right shoulder: Secondary | ICD-10-CM | POA: Diagnosis not present

## 2021-08-07 NOTE — Therapy (Signed)
?OUTPATIENT PHYSICAL THERAPY TREATMENT NOTE ? ? ?Patient Name: [name] ?MRN: [id number] ?DOB:February 27, 1961, 61 y.o., male ?Today's Date: 08/07/2021 ? ?PCP: Susy Frizzle, MD ?REFERRING PROVIDER: Susy Frizzle, MD ? ?END OF SESSION:  ? PT End of Session - 08/07/21 0857   ? ? Visit Number 3   ? Number of Visits 12   ? Date for PT Re-Evaluation 09/10/21   ? Authorization Type [employer]   ? Authorization Time Period 12 visits requested 4/4-5/16 - check auth (pending)   ? Progress Note Due on Visit 10   ? PT Start Time 0902   ? PT Stop Time [phone]   ? PT Time Calculation (min) 41 min   ? Activity Tolerance Patient tolerated treatment well   ? Behavior During Therapy [facility] for tasks assessed/performed   ? ?  ?  ? ?  ? ? ?Past Medical History:  ?Diagnosis Date  ? Acute MI, inferior wall ([place]) 04/2009  ? Chest pain   ? COPD (chronic obstructive pulmonary disease) ([place])   ? Coronary artery disease   ? STATUS POST CABG 01/2009  ? Dyslipidemia   ? Hyperlipidemia   ? Hypertension   ? Orthostatic hypotension   ? Peripheral vascular disease ([place])   ? Syncope and collapse   ? ?Past Surgical History:  ?Procedure Laterality Date  ? ANKLE SURGERY    ? x 2  ? COLONOSCOPY  April 2013  ? CORONARY ANGIOPLASTY    ? CORONARY ARTERY BYPASS GRAFT  2010  ? FOOT SURGERY    ? HERNIA REPAIR  01/23/12  ? umb hernia  ? Kidney stimulator    ? LEFT HEART CATH AND CORS/GRAFTS ANGIOGRAPHY N/A 06/02/2016  ? Procedure: Left Heart Cath and Cors/Grafts Angiography;  Surgeon: Lorretta Harp, MD;  Location: [place] [facility];  Service: Cardiovascular;  Laterality: N/A;  ? LEFT HEART CATHETERIZATION WITH CORONARY/GRAFT ANGIOGRAM N/A 07/08/2012  ? Procedure: LEFT HEART CATHETERIZATION WITH Beatrix Fetters;  Surgeon: Thayer Headings, MD;  Location: [facility] CATH LAB;  Service: Cardiovascular;  Laterality: N/A;  ? PR VEIN BYPASS GRAFT,AORTO-FEM-POP    ? STERNAL WIRES REMOVAL  07/08/2011  ? Procedure: STERNAL WIRES REMOVAL;  Surgeon: Grace Isaac, MD;  Location: [place];  Service: Open Heart Surgery;  Laterality: N/A;  removal of 4th wire  ? ?Patient Active Problem List  ? Diagnosis Date Noted  ? Diverticular disease of colon 05/02/2021  ? Gastro-esophageal reflux disease without esophagitis 05/02/2021  ? Hematuria 05/02/2021  ? Personal history of colonic polyps 05/02/2021  ? 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  ? Mixed hyperlipidemia 02/23/2017  ? Peripheral vascular disease ([place]) 06/01/2016  ? Nasolacrimal duct obstruction, acquired, right 03/11/2016  ? Medication side effects 06/28/2014  ? COPD (chronic obstructive pulmonary disease) ([place]) 05/24/2013  ? Coronary artery disease 06/14/2012  ? Chronic, continuous use of opioids 06/14/2012  ? Fecal incontinence 05/18/2012  ? Umbilical hernia 00/76/2263  ? Increased frequency of urination 12/11/2011  ? Prostatitis 11/13/2011  ? Atherosclerosis of native arteries of the extremities with intermittent claudication 10/03/2011  ? Pain in limb 10/03/2011  ? Fecal smearing 09/26/2011  ? Acquired hindfoot varus 08/12/2011  ? Instability of ankle joint 08/12/2011  ? Painful sternal wire 06/30/2011  ? Encounter for therapeutic drug monitoring 04/06/2011  ? Sural neuritis 04/06/2011  ? Ankle pain 01/31/2011  ? Peripheral neuropathy 01/31/2011  ? Chronic pain syndrome 01/30/2011  ? Leg cramps 11/12/2010  ? Hx of Inferior MI   ? Essential  hypertension   ? Dyspnea   ? Orthostatic hypotension   ? History of syncope   ? ? ?REFERRING DIAG: M25.511,G89.29 (ICD-10-CM) - Chronic right shoulder pain  ? ?THERAPY DIAG:  ?Chronic right shoulder pain ? ?Cervicalgia ? ?Other symptoms and signs involving the musculoskeletal system ? ?Acute pain of right shoulder ? ?PERTINENT HISTORY: Neck pain ? ?PRECAUTIONS: None ? ?SUBJECTIVE:  Patient had injection last week. He says this helped some, about 30% better but pain is still there. He says the pain feels the same, just a little less.  ? ?PAIN:  ?Are you having pain? Yes: NPRS scale: 7/10 ?Pain  location: RT scapular pain  ?Pain description: sharp, stabbing  ?Aggravating factors: "just there"  ?Relieving factors: holding hand overhead  ? ?OBJECTIVE:  ?  ?DIAGNOSTIC FINDINGS:  ?CT 06/28/21 IMPRESSION: ?1. Early foraminal encroachment bilaterally C3-4 secondary to ?uncovertebral spurring. ?2. Foraminal stenosis left greater than right C6-7 secondary to disc ?bulge and uncovertebral spurring. ?3. Disc bulges C4-5, C5-6, C7-T1 without compressive pathology. ?4.  Aortic Atherosclerosis (ICD10-170.0). ?  ?PATIENT SURVEYS:  ?FOTO 55% function ?  ?COGNITION: ?          Overall cognitive status: Within functional limits for tasks assessed ?                                 ?SENSATION: ?WFL ?  ?POSTURE: ?Slouched in seated  ? ?            ?TODAY'S TREATMENT:  ?08/07/21 ?DRY NEEDLING: ?Dry needling consent given? yes ?Educational handouts provided? yes ?Muscles treated: RT cervical paraspinals (C5-7) ?Response from dry needling: good tolerance, no twitch, pain reduction to 4/10 following  ?  ?Manual STM to RT cervical paraspinals  pre and post dry needling for trigger point identification and surface area preparation  ?Manual cervical traction 4 x 30"  ? ?Supine chin tucks 20 x 3" ?Supine scapular retraction 20 x 3" ?Supine shoulder ER with RTB 20 x 5"  ?Supine cervical rotation AROM x 20  ? ?08/01/21 ?DRY NEEDLING: ?Dry needling consent given? yes ?Educational handouts provided? yes ?Muscles treated: RT rhomboid, levator,upper trapezius ?Response from dry needling: good tolerance, no twitches, notes reduction of pain from 9/10 to 5/10 ? ?Manual STM to RT rhomboid, upper trap and levator pre and post dry needling for trigger point identification and surface area preparation  ? ?Chin tuck x10 ? Chin tuck with extension x10 (worsened) ?Chin tuck with RT rotation x10 (worsened) ?Chin tuck with flexion (no effect)  ?Chin tuck at wall x 10 (no effect) ? ?07/30/21 ?Cervical retractions in supine 2x 10  ?Self STM with tennis ball 5  minutes in standing ?  ?  ?PATIENT EDUCATION: ?Education details: Patient educated on exam findings, POC, scope of PT, HEP, and DN. ?Person educated: Patient ?Education method: Explanation, Demonstration, and Handouts ?Education comprehension: verbalized understanding, returned demonstration, verbal cues required, and tactile cues required ?  ?  ?  ?HOME EXERCISE PROGRAM: ?Access Code: [email or web address] ?URL: [email or web address] ?Date: 08/01/2021 ?Prepared by: Josue Hector ? ?Exercises ?- Seated Scapular Retraction  - 3 x daily - 7 x weekly - 2 sets - 10 reps - 3-5 second hold ?- Standing Isometric Cervical Retraction with Chin Tucks and Ball at [employer]  - 3 x daily - 7 x weekly - 2 sets - 10 reps - 3-5 second holdCervical retractions in supine 2x 10  ?Self STM with tennis ball 5  minutes in standing ?  ?ASSESSMENT: ?  ?CLINICAL IMPRESSION: ?Patient with improved tolerance today. Still with ongoing radicular symptoms into RT shoulder and arm. Some beneift with dry needling, noting pain reduction following. Also performed manual traction with improved RT cervical rotation following. Educated patient on purpose and function of traction and showed self traction devices online. Patient will continue to benefit from skilled therapy services to reduce remaining deficits and improve functional ability.  ? ?   ?  ?  ?OBJECTIVE IMPAIRMENTS decreased activity tolerance, decreased mobility, decreased ROM, decreased strength, increased muscle spasms, impaired flexibility, impaired UE functional use, improper body mechanics, postural dysfunction, and pain.  ?  ?ACTIVITY LIMITATIONS cleaning, community activity, driving, occupation, laundry, yard work, and shopping.  ?  ?PERSONAL FACTORS Behavior pattern, Fitness, Time since onset of injury/illness/exacerbation, and 1-2 comorbidities: neck pain, increased BMI  are also affecting patient's functional outcome.  ?  ?  ?REHAB POTENTIAL: Good ?  ?CLINICAL DECISION MAKING:  Evolving/moderate complexity ?  ?EVALUATION COMPLEXITY: Moderate ?  ?  ?GOALS: ?Goals reviewed with patient? Yes ?  ?SHORT TERM GOALS: Target date: 08/20/2021 ?  ?Patient will be independent with HEP in order to imp

## 2021-08-09 ENCOUNTER — Encounter (HOSPITAL_COMMUNITY): Payer: Self-pay | Admitting: Physical Therapy

## 2021-08-09 ENCOUNTER — Ambulatory Visit (HOSPITAL_COMMUNITY): Payer: Medicare HMO | Admitting: Physical Therapy

## 2021-08-09 DIAGNOSIS — M25511 Pain in right shoulder: Secondary | ICD-10-CM

## 2021-08-09 DIAGNOSIS — R29898 Other symptoms and signs involving the musculoskeletal system: Secondary | ICD-10-CM

## 2021-08-09 DIAGNOSIS — G8929 Other chronic pain: Secondary | ICD-10-CM | POA: Diagnosis not present

## 2021-08-09 DIAGNOSIS — M542 Cervicalgia: Secondary | ICD-10-CM | POA: Diagnosis not present

## 2021-08-09 NOTE — Therapy (Signed)
?OUTPATIENT PHYSICAL THERAPY TREATMENT NOTE ? ? ?Patient Name: [name] ?MRN: [id number] ?DOB:12-18-1960, 61 y.o., male ?Today's Date: 08/09/2021 ? ?PCP: Susy Frizzle, MD ?REFERRING PROVIDER: Susy Frizzle, MD ? ?END OF SESSION:  ? PT End of Session - 08/09/21 0857   ? ? Visit Number 4   ? Number of Visits 12   ? Date for PT Re-Evaluation 09/10/21   ? Authorization Type [employer]   ? Authorization Time Period 12 approved 4/4-5/16/23   ? Authorization - Visit Number 3   ? Authorization - Number of Visits 12   ? Progress Note Due on Visit 10   ? PT Start Time 0908   ? PT Stop Time 0946   ? PT Time Calculation (min) 38 min   ? Activity Tolerance Patient tolerated treatment well   ? Behavior During Therapy [facility] for tasks assessed/performed   ? ?  ?  ? ?  ? ? ?Past Medical History:  ?Diagnosis Date  ? Acute MI, inferior wall ([place]) 04/2009  ? Chest pain   ? COPD (chronic obstructive pulmonary disease) ([place])   ? Coronary artery disease   ? STATUS POST CABG 01/2009  ? Dyslipidemia   ? Hyperlipidemia   ? Hypertension   ? Orthostatic hypotension   ? Peripheral vascular disease ([place])   ? Syncope and collapse   ? ?Past Surgical History:  ?Procedure Laterality Date  ? ANKLE SURGERY    ? x 2  ? COLONOSCOPY  April 2013  ? CORONARY ANGIOPLASTY    ? CORONARY ARTERY BYPASS GRAFT  2010  ? FOOT SURGERY    ? HERNIA REPAIR  01/23/12  ? umb hernia  ? Kidney stimulator    ? LEFT HEART CATH AND CORS/GRAFTS ANGIOGRAPHY N/A 06/02/2016  ? Procedure: Left Heart Cath and Cors/Grafts Angiography;  Surgeon: Lorretta Harp, MD;  Location: [place] [facility];  Service: Cardiovascular;  Laterality: N/A;  ? LEFT HEART CATHETERIZATION WITH CORONARY/GRAFT ANGIOGRAM N/A 07/08/2012  ? Procedure: LEFT HEART CATHETERIZATION WITH Beatrix Fetters;  Surgeon: Thayer Headings, MD;  Location: [facility] CATH LAB;  Service: Cardiovascular;  Laterality: N/A;  ? PR VEIN BYPASS GRAFT,AORTO-FEM-POP    ? STERNAL WIRES REMOVAL  07/08/2011  ? Procedure:  STERNAL WIRES REMOVAL;  Surgeon: Grace Isaac, MD;  Location: [place];  Service: Open Heart Surgery;  Laterality: N/A;  removal of 4th wire  ? ?Patient Active Problem List  ? Diagnosis Date Noted  ? Diverticular disease of colon 05/02/2021  ? Gastro-esophageal reflux disease without esophagitis 05/02/2021  ? Hematuria 05/02/2021  ? Personal history of colonic polyps 05/02/2021  ? 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  ? Mixed hyperlipidemia 02/23/2017  ? Peripheral vascular disease ([place]) 06/01/2016  ? Nasolacrimal duct obstruction, acquired, right 03/11/2016  ? Medication side effects 06/28/2014  ? COPD (chronic obstructive pulmonary disease) ([place]) 05/24/2013  ? Coronary artery disease 06/14/2012  ? Chronic, continuous use of opioids 06/14/2012  ? Fecal incontinence 05/18/2012  ? Umbilical hernia 79/05/4095  ? Increased frequency of urination 12/11/2011  ? Prostatitis 11/13/2011  ? Atherosclerosis of native arteries of the extremities with intermittent claudication 10/03/2011  ? Pain in limb 10/03/2011  ? Fecal smearing 09/26/2011  ? Acquired hindfoot varus 08/12/2011  ? Instability of ankle joint 08/12/2011  ? Painful sternal wire 06/30/2011  ? Encounter for therapeutic drug monitoring 04/06/2011  ? Sural neuritis 04/06/2011  ? Ankle pain 01/31/2011  ? Peripheral neuropathy 01/31/2011  ? Chronic pain syndrome 01/30/2011  ? Leg  cramps 11/12/2010  ? Hx of Inferior MI   ? Essential hypertension   ? Dyspnea   ? Orthostatic hypotension   ? History of syncope   ? ? ?REFERRING DIAG: M25.511,G89.29 (ICD-10-CM) - Chronic right shoulder pain  ? ?THERAPY DIAG:  ?Chronic right shoulder pain ? ?Cervicalgia ? ?Other symptoms and signs involving the musculoskeletal system ? ?Acute pain of right shoulder ? ?PERTINENT HISTORY: Neck pain ? ?PRECAUTIONS: None ? ?SUBJECTIVE:  Patient reports he is doing better. He did not have pain until the night following last session. He feels the needling has been helpful.  ? ?PAIN:  ?Are you having pain? Yes:  NPRS scale: 4.5/10 ?Pain location: RT scapular pain  ?Pain description: sharp, stabbing  ?Aggravating factors: "just there"  ?Relieving factors: holding hand overhead  ? ?OBJECTIVE:  ?  ?DIAGNOSTIC FINDINGS:  ?CT 06/28/21 IMPRESSION: ?1. Early foraminal encroachment bilaterally C3-4 secondary to ?uncovertebral spurring. ?2. Foraminal stenosis left greater than right C6-7 secondary to disc ?bulge and uncovertebral spurring. ?3. Disc bulges C4-5, C5-6, C7-T1 without compressive pathology. ?4.  Aortic Atherosclerosis (ICD10-170.0). ?  ?PATIENT SURVEYS:  ?FOTO 55% function ?  ?COGNITION: ?          Overall cognitive status: Within functional limits for tasks assessed ?                                 ?SENSATION: ?WFL ?  ?POSTURE: ?Slouched in seated  ? ?            ?TODAY'S TREATMENT:  ?08/09/21 ?DRY NEEDLING: ?Dry needling consent given? yes ?Educational handouts provided? yes ?Muscles treated: RT cervical paraspinals, RT upper trap  ?Response from dry needling: Good tolerance, no twitch noted ? ?Manual STM to Rt upper trap and cervical paraspinals pre and post dry needling for trigger point identification and surface area preparation  ? ?Supine chin tuck 10 x5" ?Supine scap retraction 10 x 5"  ?Supine shoulder ER RTB 10 x 5" ?Shoulder rows RTB x20  ?  ? ?08/07/21 ?DRY NEEDLING: ?Dry needling consent given? yes ?Educational handouts provided? yes ?Muscles treated: RT cervical paraspinals (C5-7) ?Response from dry needling: good tolerance, no twitch, pain reduction to 4/10 following  ?  ?Manual STM to RT cervical paraspinals  pre and post dry needling for trigger point identification and surface area preparation  ?Manual cervical traction 4 x 30"  ? ?Supine chin tucks 20 x 3" ?Supine scapular retraction 20 x 3" ?Supine shoulder ER with RTB 20 x 5"  ?Supine cervical rotation AROM x 20  ? ?08/01/21 ?DRY NEEDLING: ?Dry needling consent given? yes ?Educational handouts provided? yes ?Muscles treated: RT rhomboid, levator,upper  trapezius ?Response from dry needling: good tolerance, no twitches, notes reduction of pain from 9/10 to 5/10 ? ?Manual STM to RT rhomboid, upper trap and levator pre and post dry needling for trigger point identification and surface area preparation  ? ?Chin tuck x10 ? Chin tuck with extension x10 (worsened) ?Chin tuck with RT rotation x10 (worsened) ?Chin tuck with flexion (no effect)  ?Chin tuck at wall x 10 (no effect) ? ?07/30/21 ?Cervical retractions in supine 2x 10  ?Self STM with tennis ball 5 minutes in standing ?  ?  ?PATIENT EDUCATION: ?Education details: Patient educated on exam findings, POC, scope of PT, HEP, and DN. ?Person educated: Patient ?Education method: Explanation, Demonstration, and Handouts ?Education comprehension: verbalized understanding, returned demonstration, verbal cues required, and tactile cues  required ?  ?  ?  ?HOME EXERCISE PROGRAM: ?Access Code: [email or web address] ?URL: [email or web address] ?Date: 08/09/2021 ?Prepared by: Josue Hector ? ?Exercises ?- Supine Shoulder External Rotation with Resistance  - 2-3 x daily - 7 x weekly - 2 sets - 10 reps - 3 second hold ?- Standing Shoulder Row with Anchored Resistance  - 2-3 x daily - 7 x weekly - 2 sets - 10 reps - 3 second holdAccess Code: [email or web address] ?URL: [email or web address] ?Date: 08/01/2021 ?Prepared by: Josue Hector ? ?Exercises ?- Seated Scapular Retraction  - 3 x daily - 7 x weekly - 2 sets - 10 reps - 3-5 second hold ?- Standing Isometric Cervical Retraction with Chin Tucks and Ball at [employer]  - 3 x daily - 7 x weekly - 2 sets - 10 reps - 3-5 second holdCervical retractions in supine 2x 10  ?Self STM with tennis ball 5 minutes in standing ?  ?ASSESSMENT: ?  ?CLINICAL IMPRESSION: ? Patient tolerates session well today and noting decreased pain level overall. Good tolerance to dry needling. Progressed scapular and postural strengthening with added band rows. Patient cued on proper form an hand position for  target muscle activation. Added band rows and shoulder ER to HEP and issued handout. Patient will continue to benefit from skilled therapy services to reduce remaining deficits and improve functional abili

## 2021-08-14 ENCOUNTER — Encounter (HOSPITAL_COMMUNITY): Payer: Medicare HMO | Admitting: Physical Therapy

## 2021-08-15 ENCOUNTER — Other Ambulatory Visit: Payer: Self-pay | Admitting: Physical Medicine and Rehabilitation

## 2021-08-15 ENCOUNTER — Encounter: Payer: Self-pay | Admitting: Physical Medicine and Rehabilitation

## 2021-08-15 DIAGNOSIS — M5412 Radiculopathy, cervical region: Secondary | ICD-10-CM

## 2021-08-16 ENCOUNTER — Other Ambulatory Visit: Payer: Self-pay | Admitting: Cardiovascular Disease

## 2021-08-16 ENCOUNTER — Telehealth: Payer: Self-pay | Admitting: *Deleted

## 2021-08-16 NOTE — Telephone Encounter (Signed)
? ?  Pre-operative Risk Assessment  ?  ?Patient Name: [name]  ?DOB: 1961-04-02 ?MRN: [id number]  ? ?  ? ?Request for Surgical Clearance   ? ?Procedure:   LUMBAR EPIDURAL ? ?Date of Surgery:  Clearance TBD                              ?   ?Surgeon:  MD NOT LISTED ?Surgeon's Group or Practice Name:  [facility] ?Phone number:  [phone] ?Fax number:  [phone] ATTN: CATHY C ?  ?Type of Clearance Requested:   ?- Medical  ?- Pharmacy:  Hold Clopidogrel (Plavix) x 5 DAYS PRIOR ?  ?Type of Anesthesia:  Local  ?  ?Additional requests/questions:   ? ?Signed, ?Julaine Hua   ?08/16/2021, 4:56 PM  ? ?

## 2021-08-19 NOTE — Telephone Encounter (Signed)
Dr. Acie Fredrickson, ?We are asked to hold plavix in this patient with history of remote CABG, last heart cath in 2018, no recent ischemic evaluation.  ? ?OK to hold plavix for lumbar epidural? ? ? ? ?Will require telephone visit for clearance.  ?

## 2021-08-20 ENCOUNTER — Ambulatory Visit (HOSPITAL_COMMUNITY): Payer: Medicare HMO | Admitting: Physical Therapy

## 2021-08-20 ENCOUNTER — Encounter (HOSPITAL_COMMUNITY): Payer: Self-pay | Admitting: Physical Therapy

## 2021-08-20 DIAGNOSIS — R29898 Other symptoms and signs involving the musculoskeletal system: Secondary | ICD-10-CM | POA: Diagnosis not present

## 2021-08-20 DIAGNOSIS — G8929 Other chronic pain: Secondary | ICD-10-CM

## 2021-08-20 DIAGNOSIS — M25511 Pain in right shoulder: Secondary | ICD-10-CM | POA: Diagnosis not present

## 2021-08-20 DIAGNOSIS — M542 Cervicalgia: Secondary | ICD-10-CM | POA: Diagnosis not present

## 2021-08-20 NOTE — Telephone Encounter (Signed)
Per Dr. Acie Fredrickson "[facility] to hold Plavix for 5 days prior to procedure" ?

## 2021-08-20 NOTE — Therapy (Signed)
?OUTPATIENT PHYSICAL THERAPY TREATMENT NOTE ? ? ?Patient Name: [name] ?MRN: [id number] ?DOB:1960-07-05, 61 y.o., male ?Today's Date: 08/20/2021 ? ?PCP: Susy Frizzle, MD ?REFERRING PROVIDER: Leandrew Koyanagi, MD ? ?END OF SESSION:  ? PT End of Session - 08/20/21 1008   ? ? Visit Number 5   ? Number of Visits 12   ? Date for PT Re-Evaluation 09/10/21   ? Authorization Type [employer]   ? Authorization Time Period 12 approved 4/4-5/16/23   ? Authorization - Visit Number 4   ? Authorization - Number of Visits 12   ? Progress Note Due on Visit 10   ? PT Start Time 1008   ? PT Stop Time 7672   ? PT Time Calculation (min) 32 min   ? Activity Tolerance Patient tolerated treatment well   ? Behavior During Therapy [facility] for tasks assessed/performed   ? ?  ?  ? ?  ? ? ?Past Medical History:  ?Diagnosis Date  ? Acute MI, inferior wall ([place]) 04/2009  ? Chest pain   ? COPD (chronic obstructive pulmonary disease) ([place])   ? Coronary artery disease   ? STATUS POST CABG 01/2009  ? Dyslipidemia   ? Hyperlipidemia   ? Hypertension   ? Orthostatic hypotension   ? Peripheral vascular disease ([place])   ? Syncope and collapse   ? ?Past Surgical History:  ?Procedure Laterality Date  ? ANKLE SURGERY    ? x 2  ? COLONOSCOPY  April 2013  ? CORONARY ANGIOPLASTY    ? CORONARY ARTERY BYPASS GRAFT  2010  ? FOOT SURGERY    ? HERNIA REPAIR  01/23/12  ? umb hernia  ? Kidney stimulator    ? LEFT HEART CATH AND CORS/GRAFTS ANGIOGRAPHY N/A 06/02/2016  ? Procedure: Left Heart Cath and Cors/Grafts Angiography;  Surgeon: Lorretta Harp, MD;  Location: [facility];  Service: Cardiovascular;  Laterality: N/A;  ? LEFT HEART CATHETERIZATION WITH CORONARY/GRAFT ANGIOGRAM N/A 07/08/2012  ? Procedure: LEFT HEART CATHETERIZATION WITH Beatrix Fetters;  Surgeon: Thayer Headings, MD;  Location: [facility] CATH LAB;  Service: Cardiovascular;  Laterality: N/A;  ? PR VEIN BYPASS GRAFT,AORTO-FEM-POP    ? STERNAL WIRES REMOVAL  07/08/2011  ? Procedure:  STERNAL WIRES REMOVAL;  Surgeon: Grace Isaac, MD;  Location: [place];  Service: Open Heart Surgery;  Laterality: N/A;  removal of 4th wire  ? ?Patient Active Problem List  ? Diagnosis Date Noted  ? Diverticular disease of colon 05/02/2021  ? Gastro-esophageal reflux disease without esophagitis 05/02/2021  ? Hematuria 05/02/2021  ? Personal history of colonic polyps 05/02/2021  ? 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  ? Mixed hyperlipidemia 02/23/2017  ? Peripheral vascular disease ([place]) 06/01/2016  ? Nasolacrimal duct obstruction, acquired, right 03/11/2016  ? Medication side effects 06/28/2014  ? COPD (chronic obstructive pulmonary disease) ([place]) 05/24/2013  ? Coronary artery disease 06/14/2012  ? Chronic, continuous use of opioids 06/14/2012  ? Fecal incontinence 05/18/2012  ? Umbilical hernia 09/47/0962  ? Increased frequency of urination 12/11/2011  ? Prostatitis 11/13/2011  ? Atherosclerosis of native arteries of the extremities with intermittent claudication 10/03/2011  ? Pain in limb 10/03/2011  ? Fecal smearing 09/26/2011  ? Acquired hindfoot varus 08/12/2011  ? Instability of ankle joint 08/12/2011  ? Painful sternal wire 06/30/2011  ? Encounter for therapeutic drug monitoring 04/06/2011  ? Sural neuritis 04/06/2011  ? Ankle pain 01/31/2011  ? Peripheral neuropathy 01/31/2011  ? Chronic pain syndrome 01/30/2011  ? Leg  cramps 11/12/2010  ? Hx of Inferior MI   ? Essential hypertension   ? Dyspnea   ? Orthostatic hypotension   ? History of syncope   ? ? ?REFERRING DIAG: M25.511,G89.29 (ICD-10-CM) - Chronic right shoulder pain  ? ?THERAPY DIAG:  ?Chronic right shoulder pain ? ?Cervicalgia ? ?Other symptoms and signs involving the musculoskeletal system ? ?PERTINENT HISTORY: Neck pain ? ?PRECAUTIONS: None ? ?SUBJECTIVE:  Has been having neck injections. They told him to lay low for a while to see if their injections are working. He states injection helped about 60%. DN helps while in session but gets to hurting shortly  after leaving. Is wanting to go on hold for a few weeks while he figures out what's going on with injections. ? ?PAIN:  ?Are you having pain? Yes: NPRS scale: 4.5/10 ?Pain location: RT scapular pain  ?Pain description: sharp, stabbing  ?Aggravating factors: "just there"  ?Relieving factors: holding hand overhead  ? ?OBJECTIVE:  ?  ?DIAGNOSTIC FINDINGS:  ?CT 06/28/21 IMPRESSION: ?1. Early foraminal encroachment bilaterally C3-4 secondary to ?uncovertebral spurring. ?2. Foraminal stenosis left greater than right C6-7 secondary to disc ?bulge and uncovertebral spurring. ?3. Disc bulges C4-5, C5-6, C7-T1 without compressive pathology. ?4.  Aortic Atherosclerosis (ICD10-170.0). ?  ?PATIENT SURVEYS:  ?FOTO 55% function ?  ?COGNITION: ?          Overall cognitive status: Within functional limits for tasks assessed ?                                 ?SENSATION: ?WFL ?  ?POSTURE: ?Slouched in seated  ? ?            Cervical AROM:  ?  07/30/21  08/20/21  ? Flexion  25% limited * symptoms to elbow  0% limited * symptoms to elbow  ? Extension  25% limited * symptoms to shoulder  25% limited  ? R ROT  25% limited * symptoms to shoulder  50% limited * symptoms to chest/ anterior R shoulder  ? L ROT  0% limited   0% limited  ? R SB  25% limited * symptoms to shoulder blade  25% limited * symptoms to chest/ anterior R shoulder  ?L SB 25% limited  0% limited  ?                      * Pain              ?(Blank rows = not tested) ?  ?UPPER EXTREMITY ROM: intially was able to lift overhead but decreased ROM when measuring ?  ?Active ROM Right ?07/30/2021 Left ?07/30/2021 Right  ?08/20/21 Left ?4/25  ?Shoulder flexion 105 128 120 150  ?Shoulder extension        ?Shoulder abduction 105 125  135  ?Shoulder adduction        ?Shoulder internal rotation        ?Shoulder external rotation        ?Elbow flexion        ?Elbow extension        ?Wrist flexion        ?Wrist extension        ?Wrist ulnar deviation        ?Wrist radial deviation        ?Wrist  pronation        ?Wrist supination        ?(Blank  rows = not tested) ?  ?UPPER EXTREMITY MMT: ?  ?MMT Right ?07/30/2021 Left ?07/30/2021 Right ?08/20/21 Left  ?08/20/21  ?Shoulder flexion 4+/5 5/5 5/5 5/5  ?Shoulder extension        ?Shoulder abduction 4+/5 5/5 5/5 5/5  ?Shoulder adduction        ?Shoulder internal rotation 4+/5 5/5 5/5 5/5  ?Shoulder external rotation 4+/5 4+/5 4+/5 4+/5  ?Middle trapezius        ?Lower trapezius        ?Elbow flexion 5/5 5/5 5/5 5/5  ?Elbow extension 5/5 5/5 5/5 5/5  ?Wrist flexion        ?Wrist extension        ?Wrist ulnar deviation        ?Wrist radial deviation        ?Wrist pronation        ?Wrist supination        ?Grip strength (lbs)        ?(Blank rows = not tested) ?  ? ?  ?PALPATION:  ?TTP R levator scapulae with concordant anterior chest/shoulder symptoms with palpation ? ?TODAY'S TREATMENT:  ?08/20/21 ?Reassessment ?Standing scap retraction with GH ER GTB 2x 15 ?Standing horizontal abd GTB 2x 15 ? ?08/09/21 ?DRY NEEDLING: ?Dry needling consent given? yes ?Educational handouts provided? yes ?Muscles treated: RT cervical paraspinals, RT upper trap  ?Response from dry needling: Good tolerance, no twitch noted ? ?Manual STM to Rt upper trap and cervical paraspinals pre and post dry needling for trigger point identification and surface area preparation  ? ?Supine chin tuck 10 x5" ?Supine scap retraction 10 x 5"  ?Supine shoulder ER RTB 10 x 5" ?Shoulder rows RTB x20  ?  ? ?08/07/21 ?DRY NEEDLING: ?Dry needling consent given? yes ?Educational handouts provided? yes ?Muscles treated: RT cervical paraspinals (C5-7) ?Response from dry needling: good tolerance, no twitch, pain reduction to 4/10 following  ?  ?Manual STM to RT cervical paraspinals  pre and post dry needling for trigger point identification and surface area preparation  ?Manual cervical traction 4 x 30"  ? ?Supine chin tucks 20 x 3" ?Supine scapular retraction 20 x 3" ?Supine shoulder ER with RTB 20 x 5"  ?Supine cervical  rotation AROM x 20  ? ?08/01/21 ?DRY NEEDLING: ?Dry needling consent given? yes ?Educational handouts provided? yes ?Muscles treated: RT rhomboid, levator,upper trapezius ?Response from dry needling: good toleran

## 2021-08-21 ENCOUNTER — Ambulatory Visit (HOSPITAL_COMMUNITY): Payer: Medicare HMO | Admitting: Physical Therapy

## 2021-08-27 ENCOUNTER — Ambulatory Visit
Admission: RE | Admit: 2021-08-27 | Discharge: 2021-08-27 | Disposition: A | Discharge status: Home / Self Care | Payer: Medicare HMO | Source: Ambulatory Visit | Attending: Physical Medicine and Rehabilitation | Admitting: Physical Medicine and Rehabilitation

## 2021-08-27 ENCOUNTER — Encounter (HOSPITAL_COMMUNITY): Payer: Medicare HMO | Admitting: Physical Therapy

## 2021-08-27 DIAGNOSIS — M4722 Other spondylosis with radiculopathy, cervical region: Secondary | ICD-10-CM | POA: Diagnosis not present

## 2021-08-27 MED ORDER — TRIAMCINOLONE ACETONIDE 40 MG/ML IJ SUSP (RADIOLOGY)
60.0000 mg | Freq: Once | INTRAMUSCULAR | Status: AC
  Administered 2021-08-27: 60 mg via EPIDURAL

## 2021-08-27 MED ORDER — IOPAMIDOL (ISOVUE-M 300) INJECTION 61%
1.0000 mL | Freq: Once | INTRAMUSCULAR | Status: AC
  Administered 2021-08-27: 1 mL via EPIDURAL

## 2021-08-27 NOTE — Discharge Instructions (Signed)

## 2021-08-29 ENCOUNTER — Encounter (HOSPITAL_COMMUNITY): Payer: Medicare HMO | Admitting: Physical Therapy

## 2021-08-29 ENCOUNTER — Ambulatory Visit (HOSPITAL_COMMUNITY): Payer: Medicare HMO | Admitting: Physical Therapy

## 2021-09-03 ENCOUNTER — Encounter (HOSPITAL_COMMUNITY): Payer: Medicare HMO | Admitting: Physical Therapy

## 2021-09-05 ENCOUNTER — Encounter (HOSPITAL_COMMUNITY): Payer: Medicare HMO | Admitting: Physical Therapy

## 2021-09-05 DIAGNOSIS — G629 Polyneuropathy, unspecified: Secondary | ICD-10-CM | POA: Diagnosis not present

## 2021-09-05 DIAGNOSIS — R159 Full incontinence of feces: Secondary | ICD-10-CM | POA: Diagnosis not present

## 2021-09-05 DIAGNOSIS — Z462 Encounter for fitting and adjustment of other devices related to nervous system and special senses: Secondary | ICD-10-CM | POA: Diagnosis not present

## 2021-09-05 DIAGNOSIS — G894 Chronic pain syndrome: Secondary | ICD-10-CM | POA: Diagnosis not present

## 2021-09-05 DIAGNOSIS — R350 Frequency of micturition: Secondary | ICD-10-CM | POA: Diagnosis not present

## 2021-09-05 DIAGNOSIS — I10 Essential (primary) hypertension: Secondary | ICD-10-CM | POA: Diagnosis not present

## 2021-09-05 DIAGNOSIS — R3915 Urgency of urination: Secondary | ICD-10-CM | POA: Diagnosis not present

## 2021-09-05 DIAGNOSIS — Z87891 Personal history of nicotine dependence: Secondary | ICD-10-CM | POA: Diagnosis not present

## 2021-09-05 DIAGNOSIS — J449 Chronic obstructive pulmonary disease, unspecified: Secondary | ICD-10-CM | POA: Diagnosis not present

## 2021-09-05 DIAGNOSIS — T83110A Breakdown (mechanical) of urinary electronic stimulator device, initial encounter: Secondary | ICD-10-CM | POA: Diagnosis not present

## 2021-09-05 DIAGNOSIS — I2510 Atherosclerotic heart disease of native coronary artery without angina pectoris: Secondary | ICD-10-CM | POA: Diagnosis not present

## 2021-09-09 ENCOUNTER — Encounter (HOSPITAL_COMMUNITY): Payer: Medicare HMO | Admitting: Physical Therapy

## 2021-09-10 ENCOUNTER — Encounter (HOSPITAL_COMMUNITY): Payer: Medicare HMO | Admitting: Physical Therapy

## 2021-09-12 ENCOUNTER — Encounter (HOSPITAL_COMMUNITY): Payer: Medicare HMO | Admitting: Physical Therapy

## 2021-09-12 DIAGNOSIS — I10 Essential (primary) hypertension: Secondary | ICD-10-CM | POA: Insufficient documentation

## 2021-09-12 DIAGNOSIS — M5412 Radiculopathy, cervical region: Secondary | ICD-10-CM | POA: Insufficient documentation

## 2021-09-12 DIAGNOSIS — I252 Old myocardial infarction: Secondary | ICD-10-CM | POA: Insufficient documentation

## 2021-09-17 DIAGNOSIS — M25572 Pain in left ankle and joints of left foot: Secondary | ICD-10-CM | POA: Diagnosis not present

## 2021-09-17 DIAGNOSIS — Z79899 Other long term (current) drug therapy: Secondary | ICD-10-CM | POA: Diagnosis not present

## 2021-09-17 DIAGNOSIS — M79672 Pain in left foot: Secondary | ICD-10-CM | POA: Diagnosis not present

## 2021-09-17 DIAGNOSIS — G8921 Chronic pain due to trauma: Secondary | ICD-10-CM | POA: Diagnosis not present

## 2021-09-17 DIAGNOSIS — Z5181 Encounter for therapeutic drug level monitoring: Secondary | ICD-10-CM | POA: Diagnosis not present

## 2021-10-04 ENCOUNTER — Telehealth: Payer: Self-pay | Admitting: Orthopaedic Surgery

## 2021-10-04 NOTE — Telephone Encounter (Signed)
Pt called and was wondering if he could repeat the back steroid injection at [facility]

## 2021-10-09 ENCOUNTER — Encounter (HOSPITAL_COMMUNITY): Payer: Self-pay | Admitting: Physical Therapy

## 2021-10-09 NOTE — Therapy (Signed)
[place], [place], [address] Phone: [phone]   Fax:  [phone]  Patient Details  Name: [name] MRN: [id number] Date of Birth: 1960-09-24 Referring Provider:  No ref. provider found  Encounter Date: 10/09/2021  PHYSICAL THERAPY DISCHARGE SUMMARY  Visits from Start of Care: 5  Current functional level related to goals / functional outcomes: Patient has not returned   Remaining deficits: Patient has not returned.   Education / Equipment: HEP   Patient agrees to discharge. Patient goals were not met. Patient is being discharged due to not returning since the last visit.   Vianne Bulls Tallie Dodds, PT 10/09/2021, 10:19 AM  [place], [place], [address] Phone: [phone]   Fax:  [phone]

## 2021-12-03 ENCOUNTER — Other Ambulatory Visit: Payer: Self-pay | Admitting: Pharmacist

## 2021-12-03 MED ORDER — REPATHA SURECLICK 140 MG/ML ~~LOC~~ SOAJ: 140.0000 mg | SUBCUTANEOUS | 3 refills | Status: DC

## 2021-12-04 ENCOUNTER — Telehealth: Payer: Self-pay | Admitting: Cardiovascular Disease

## 2021-12-04 NOTE — Telephone Encounter (Signed)
Pt doesn't take Praluent, he takes Repatha and has been on this since January of 2023. Not sure why pharmacy is trying to fill wrong rx. They already have rx for Repatha on file to fill. Faxed pharmacy this phone message.

## 2021-12-04 NOTE — Telephone Encounter (Signed)
Pt c/o medication issue:  1. Name of Medication: Praluent  2. How are you currently taking this medication (dosage and times per day)?   3. Are you having a reaction (difficulty breathing--STAT)? No  4. What is your medication issue? Calling to make sure that Prior Auth that was sent yesterday was received. Please advise

## 2021-12-10 DIAGNOSIS — M25572 Pain in left ankle and joints of left foot: Secondary | ICD-10-CM | POA: Diagnosis not present

## 2021-12-18 ENCOUNTER — Encounter: Payer: Self-pay | Admitting: *Deleted

## 2021-12-18 ENCOUNTER — Other Ambulatory Visit: Payer: Self-pay | Admitting: *Deleted

## 2021-12-18 NOTE — Patient Instructions (Addendum)
Visit Information  Thank you for taking time to visit with me today. Please don't hesitate to contact me if I can be of assistance to you.   Following are the goals we discussed today:   Goals Addressed               This Visit's Progress     Patient Stated     manage COPD/upper respiratory symptoms (pt-stated)   Not on track     Care Coordination Interventions: Advised patient to self assesses COPD action plan zone and make appointment with provider if in the yellow zone for 48 hours without improvement Discussed the importance of adequate rest and management of fatigue with COPD Screening for signs and symptoms of depression related to chronic disease state  Assessed social determinant of health barriers Sent his pcp a message via EPIC related to worsening symptoms of nasal & chest congestion with green secretions for potential outreach or prescription called in to his local pharmacy Encouraged precautions to prevent spread of symptoms to his wife Education sent on COPD action plan, COPD exacerbation plus diabetes meal planning and carbohydrate counting information for his wife via my chart         Our next appointment is by telephone on 12/25/21  at 2:45 pm   Please call the care guide team at [phone] if you need to cancel or reschedule your appointment.   If you are experiencing a Mental Health or [place] or need someone to talk to, please call the Suicide and Crisis Lifeline: 988 call the [place] National Suicide Prevention Lifeline: (404) 586-6626 or TTY: 6404963433 TTY 914-700-8003) to talk to a trained counselor call 1-800-273-TALK (toll free, 24 hour hotline) go to [facility] [address] [place] [phone]) call 911   Patient verbalizes understanding of instructions and care plan provided today and agrees to view in [place]. Active MyChart status and patient understanding of how to access instructions  and care plan via MyChart confirmed with patient.     The patient has been provided with contact information for the care management team and has been advised to call with any health related questions or concerns.   [place] Lavina Hamman, RN, BSN, [place] Coordinator Office number [phone]

## 2021-12-18 NOTE — Patient Outreach (Signed)
  Care Coordination   Initial Visit Note   12/18/2021 Name: [name] MRN: [id number] DOB: November 22, 1960  [name] is a 61 y.o. year old male who sees Pickard, Cammie Mcgee, MD for primary care. I spoke with  [name] by phone today  What matters to the patients health and wellness today?  Cold symptoms to include chills, green sputum, chest & nasal congestion with history of COPD    Goals Addressed               This Visit's Progress     Patient Stated     manage COPD/upper respiratory symptoms (pt-stated)   Not on track     Care Coordination Interventions: Advised patient to self assesses COPD action plan zone and make appointment with provider if in the yellow zone for 48 hours without improvement Discussed the importance of adequate rest and management of fatigue with COPD Screening for signs and symptoms of depression related to chronic disease state  Assessed social determinant of health barriers Sent his pcp a message via EPIC related to worsening symptoms of nasal & chest congestion with green secretions for potential outreach or prescription called in to his local pharmacy Encouraged precautions to prevent spread of symptoms to his wife Education sent on COPD action plan, COPD exacerbation plus diabetes meal planning and carbohydrate counting information for his wife via my chart         SDOH assessments and interventions completed:  Yes  SDOH Interventions Today    Flowsheet Row Most Recent Value  SDOH Interventions   Food Insecurity Interventions Intervention Not Indicated  Stress Interventions Other (Comment)  [care giver to disabled wife]  Transportation Interventions Intervention Not Indicated        Care Coordination Interventions Activated:  Yes  Care Coordination Interventions:  Yes, provided   Follow up plan: Follow up call scheduled for 12/25/21     Encounter Outcome:  Pt. Visit Completed   Cia Garretson L. Lavina Hamman, RN, BSN, [place]  Coordinator Office number [phone]

## 2021-12-19 ENCOUNTER — Other Ambulatory Visit: Payer: Self-pay | Admitting: Family Medicine

## 2021-12-19 MED ORDER — AZITHROMYCIN 250 MG PO TABS: ORAL_TABLET | ORAL | 0 refills | Status: DC

## 2021-12-20 ENCOUNTER — Ambulatory Visit (INDEPENDENT_AMBULATORY_CARE_PROVIDER_SITE_OTHER): Payer: Medicare HMO | Admitting: Family Medicine

## 2021-12-20 VITALS — BP 120/70 | HR 78 | Temp 97.8°F | Ht 69.0 in | Wt 253.0 lb

## 2021-12-20 DIAGNOSIS — I739 Peripheral vascular disease, unspecified: Secondary | ICD-10-CM | POA: Diagnosis not present

## 2021-12-20 DIAGNOSIS — J441 Chronic obstructive pulmonary disease with (acute) exacerbation: Secondary | ICD-10-CM

## 2021-12-20 DIAGNOSIS — I252 Old myocardial infarction: Secondary | ICD-10-CM

## 2021-12-20 DIAGNOSIS — E782 Mixed hyperlipidemia: Secondary | ICD-10-CM | POA: Diagnosis not present

## 2021-12-20 MED ORDER — PREDNISONE 20 MG PO TABS: ORAL_TABLET | ORAL | 0 refills | Status: DC

## 2021-12-20 NOTE — Progress Notes (Signed)
Subjective:    Patient ID: [name], male    DOB: June 16, 1960, 61 y.o.   MRN: [id number] Patient states that he had a bad chest cold for more than a week and a half.  He reports purulent sputum.  He has a history of COPD.  He has not smoked in 13 years.  However today on examination, he has diffuse expiratory wheezing in all 4 lung fields.  He also has rhonchorous breath sounds.  He denies any fevers or chills at home however the cough is not getting any better.  I received a message about this yesterday and I sent in a Z-Pak that he has not been started that.  He does have a history of coronary artery disease and is currently on a PK S9 inhibitor he is due to recheck his cholesterol along with his kidney and liver test.  His blood pressure today is excellent at 120/70. Past Medical History:  Diagnosis Date   Acute MI, inferior wall ([place]) 04/2009   Chest pain    COPD (chronic obstructive pulmonary disease) (HCC)    Coronary artery disease    STATUS POST CABG 01/2009   Dyslipidemia    Hyperlipidemia    Hypertension    Orthostatic hypotension    Peripheral vascular disease (HCC)    Syncope and collapse    Past Surgical History:  Procedure Laterality Date   ANKLE SURGERY     x 2   COLONOSCOPY  April 2013   CORONARY ANGIOPLASTY     CORONARY ARTERY BYPASS GRAFT  2010   FOOT SURGERY     HERNIA REPAIR  01/23/12   umb hernia   Kidney stimulator     LEFT HEART CATH AND CORS/GRAFTS ANGIOGRAPHY N/A 06/02/2016   Procedure: Left Heart Cath and Cors/Grafts Angiography;  Surgeon: Lorretta Harp, MD;  Location: [place] [facility];  Service: Cardiovascular;  Laterality: N/A;   LEFT HEART CATHETERIZATION WITH CORONARY/GRAFT ANGIOGRAM N/A 07/08/2012   Procedure: LEFT HEART CATHETERIZATION WITH Beatrix Fetters;  Surgeon: Thayer Headings, MD;  Location: [facility] CATH [facility];  Service: Cardiovascular;  Laterality: N/A;   PR VEIN BYPASS GRAFT,AORTO-FEM-POP     STERNAL WIRES REMOVAL  07/08/2011    Procedure: STERNAL WIRES REMOVAL;  Surgeon: Grace Isaac, MD;  Location: [place];  Service: Open Heart Surgery;  Laterality: N/A;  removal of 4th wire   Current Outpatient Medications on File Prior to Visit  Medication Sig Dispense Refill   albuterol (VENTOLIN HFA) 108 (90 Base) MCG/ACT inhaler INHALE 2 PUFFS INTO THE LUNGS EVERY 4 HOURS AS NEEDED FOR WHEEZING OR SHORTNESS OF BREATH 42.5 g 3   amLODipine (NORVASC) 10 MG tablet TAKE 1 TABLET(10 MG) BY MOUTH DAILY 90 tablet 3   aspirin EC 81 MG tablet Take 1 tablet (81 mg total) by mouth every morning. 90 tablet 3   atorvastatin (LIPITOR) 80 MG tablet Take 1 tablet (80 mg total) by mouth at bedtime. PT NEEDS OV FOR FURTHER REFILLS, NOT SEEN SINCE 2021 30 tablet 0   azithromycin (ZITHROMAX) 250 MG tablet 2 tabs poqday1, 1 tab poqday 2-5 6 tablet 0   cloNIDine (CATAPRES) 0.1 MG tablet      clopidogrel (PLAVIX) 75 MG tablet TAKE 1 TABLET(75 MG) BY MOUTH DAILY 90 tablet 3   cyclobenzaprine (FLEXERIL) 10 MG tablet Take 1 tablet (10 mg total) by mouth 2 (two) times daily as needed for muscle spasms. 20 tablet 0   Evolocumab (REPATHA SURECLICK) 811 MG/ML SOAJ  Inject 140 mg into the skin every 14 (fourteen) days. 6 mL 3   folic acid (FOLVITE) 1 MG tablet Take by mouth.     furosemide (LASIX) 20 MG tablet TAKE 1 TABLET BY MOUTH 3 TIMES A WEEK AS NEEDED 45 tablet 3   gabapentin (NEURONTIN) 800 MG tablet Take 800 mg by mouth 3 (three) times daily.     hydrochlorothiazide (HYDRODIURIL) 25 MG tablet Take 1 tablet (25 mg total) by mouth daily. Pt past due for 6 month follow up; please call for appt 90 tablet 3   hydroxyurea (HYDREA) 500 MG capsule Take by mouth.     ibuprofen (ADVIL) 800 MG tablet Take 1 tablet (800 mg total) by mouth 3 (three) times daily. Take with food 21 tablet 0   losartan (COZAAR) 100 MG tablet TAKE 1 TABLET BY MOUTH DAILY 90 tablet 3   methocarbamol (ROBAXIN) 500 MG tablet Take 1 tablet (500 mg total) by mouth 2 (two) times daily. 20  tablet 4   omeprazole (PRILOSEC) 40 MG capsule Take 1 capsule by mouth daily.     oxyCODONE-acetaminophen (PERCOCET) 10-325 MG tablet Take 1 tablet by mouth every 6 (six) hours as needed.     OXYCONTIN 20 MG 12 hr tablet Take 20 mg by mouth 2 (two) times daily.     XTAMPZA ER 18 MG C12A Take by mouth.     No current facility-administered medications on file prior to visit.   Allergies  Allergen Reactions   Rosuvastatin     Muscle aches. Other reaction(s): leg pain, Other Muscle aches. Other reaction(s): leg pain Other reaction(s): leg pain    Social History   Socioeconomic History   Marital status: Married    Spouse name: [name]   Number of children: 2   Years of education: Not on file   Highest education level: Not on file  Occupational History   Occupation: Disability  Tobacco Use   Smoking status: Former    Packs/day: 3.00    Years: 30.00    Total pack years: 90.00    Types: Cigarettes, Cigars    Quit date: 01/26/2009    Years since quitting: 12.9   Smokeless tobacco: Never  Vaping Use   Vaping Use: Never used  Substance and Sexual Activity   Alcohol use: No   Drug use: No   Sexual activity: Not Currently  Other Topics Concern   Not on file  Social History Narrative   Married x 37 years.   Social Determinants of Health   Financial Resource Strain: Not on file  Food Insecurity: No Food Insecurity (12/18/2021)   Hunger Vital Sign    Worried About Running Out of Food in the Last Year: Never true    Ran Out of Food in the Last Year: Never true  Transportation Needs: No Transportation Needs (12/18/2021)   PRAPARE - Hydrologist (Medical): No    Lack of Transportation (Non-Medical): No  Physical Activity: Insufficiently Active (05/02/2021)   Exercise Vital Sign    Days of Exercise per Week: 5 days    Minutes of Exercise per Session: 20 min  Stress: No Stress Concern Present (05/02/2021)   [place]    Feeling of Stress : Not at all  Social Connections: Moderately Isolated (05/02/2021)   Social Connection and Isolation Panel [NHANES]    Frequency of Communication with Friends and Family: More than three times a week  Frequency of Social Gatherings with Friends and Family: More than three times a week    Attends Religious Services: Never    Marine scientist or Organizations: No    Attends Archivist Meetings: Never    Marital Status: Married  Human resources officer Violence: Not At Risk (05/02/2021)   Humiliation, Afraid, Rape, and Kick questionnaire    Fear of Current or Ex-Partner: No    Emotionally Abused: No    Physically Abused: No    Sexually Abused: No   Family History  Problem Relation Age of Onset   Coronary artery disease Father 16   Diabetes Father    Hyperlipidemia Father    Heart disease Father        before age 15   Hypertension Father    Cancer Mother        uterine   Hyperlipidemia Mother    Diabetes Mother    Hypertension Mother    Hypertension Brother      Review of Systems  All other systems reviewed and are negative.      Objective:   Physical Exam Vitals reviewed.  Constitutional:      General: He is not in acute distress.    Appearance: He is well-developed. He is not diaphoretic.  HENT:     Head: Normocephalic and atraumatic.     Right Ear: External ear normal.     Left Ear: External ear normal.     Nose: Nose normal. No congestion.     Mouth/Throat:     Pharynx: No oropharyngeal exudate.  Neck:     Thyroid: No thyromegaly.     Vascular: No JVD.     Trachea: No tracheal deviation.  Cardiovascular:     Rate and Rhythm: Normal rate and regular rhythm.     Heart sounds: Normal heart sounds. No murmur heard.    No friction rub. No gallop.  Pulmonary:     Effort: Pulmonary effort is normal. No respiratory distress.     Breath sounds: No stridor. Wheezing and rhonchi present. No rales.   Chest:     Chest wall: No tenderness.  Musculoskeletal:        General: No tenderness or deformity. Normal range of motion.     Cervical back: Normal range of motion and neck supple.  Lymphadenopathy:     Cervical: No cervical adenopathy.  Skin:    General: Skin is warm.     Coloration: Skin is not pale.     Findings: No erythema or rash.  Neurological:     Mental Status: He is alert and oriented to person, place, and time.     Cranial Nerves: No cranial nerve deficit.     Motor: No abnormal muscle tone.     Coordination: Coordination normal.     Deep Tendon Reflexes: Reflexes are normal and symmetric.  Psychiatric:        Behavior: Behavior normal.        Thought Content: Thought content normal.        Judgment: Judgment normal.           Assessment & Plan:  Mixed hyperlipidemia - Plan: CBC with Differential/Platelet, Lipid panel, COMPLETE METABOLIC PANEL WITH GFR  Peripheral vascular disease ([place]) - Plan: CBC with Differential/Platelet, Lipid panel, COMPLETE METABOLIC PANEL WITH GFR  Hx of Inferior MI - Plan: CBC with Differential/Platelet, Lipid panel, COMPLETE METABOLIC PANEL WITH GFR  COPD exacerbation (HCC) Patient is having a COPD exacerbation.  Begin prednisone  taper pack and use Z-Pak given the purulent sputum.  Blood pressure today is excellent however I would like to check a fasting lipid panel.  Given his history of peripheral vascular disease and coronary artery disease I like his LDL cholesterol to be below 70.  Await the results of his lab work.

## 2021-12-21 LAB — COMPLETE METABOLIC PANEL WITH GFR
AG Ratio: 1.7 (calc) (ref 1.0–2.5)
ALT: 10 U/L (ref 9–46)
AST: 18 U/L (ref 10–35)
Albumin: 4.4 g/dL (ref 3.6–5.1)
Alkaline phosphatase (APISO): 87 U/L (ref 35–144)
BUN/Creatinine Ratio: 11 (calc) (ref 6–22)
BUN: 17 mg/dL (ref 7–25)
CO2: 23 mmol/L (ref 20–32)
Calcium: 9.1 mg/dL (ref 8.6–10.3)
Chloride: 107 mmol/L (ref 98–110)
Creat: 1.55 mg/dL — ABNORMAL HIGH (ref 0.70–1.30)
Globulin: 2.6 g/dL (calc) (ref 1.9–3.7)
Glucose, Bld: 108 mg/dL — ABNORMAL HIGH (ref 65–99)
Potassium: 4.1 mmol/L (ref 3.5–5.3)
Sodium: 142 mmol/L (ref 135–146)
Total Bilirubin: 0.9 mg/dL (ref 0.2–1.2)
Total Protein: 7 g/dL (ref 6.1–8.1)
eGFR: 51 mL/min/{1.73_m2} — ABNORMAL LOW (ref >=60)

## 2021-12-21 LAB — LIPID PANEL
Cholesterol: 70 mg/dL (ref <200)
HDL: 35 mg/dL — ABNORMAL LOW (ref >=40)
LDL Cholesterol (Calc): 17 mg/dL (calc)
Non-HDL Cholesterol (Calc): 35 mg/dL (calc) (ref <130)
Total CHOL/HDL Ratio: 2 (calc) (ref <5.0)
Triglycerides: 93 mg/dL (ref <150)

## 2021-12-21 LAB — CBC WITH DIFFERENTIAL/PLATELET
Absolute Monocytes: 700 cells/uL (ref 200–950)
Basophils Absolute: 42 cells/uL (ref 0–200)
Basophils Relative: 0.6 %
Eosinophils Absolute: 301 cells/uL (ref 15–500)
Eosinophils Relative: 4.3 %
HCT: 43.5 % (ref 38.5–50.0)
Hemoglobin: 14.5 g/dL (ref 13.2–17.1)
Lymphs Abs: 1183 cells/uL (ref 850–3900)
MCH: 27.8 pg (ref 27.0–33.0)
MCHC: 33.3 g/dL (ref 32.0–36.0)
MCV: 83.5 fL (ref 80.0–100.0)
MPV: 11.5 fL (ref 7.5–12.5)
Monocytes Relative: 10 %
Neutro Abs: 4774 cells/uL (ref 1500–7800)
Neutrophils Relative %: 68.2 %
Platelets: 187 10*3/uL (ref 140–400)
RBC: 5.21 10*6/uL (ref 4.20–5.80)
RDW: 12.4 % (ref 11.0–15.0)
Total Lymphocyte: 16.9 %
WBC: 7 10*3/uL (ref 3.8–10.8)

## 2021-12-25 ENCOUNTER — Ambulatory Visit: Payer: Self-pay | Admitting: *Deleted

## 2021-12-25 NOTE — Patient Outreach (Signed)
  Care Coordination   12/25/2021 Name: [name] MRN: [id number] DOB: 1960-08-19   Care Coordination Outreach Attempts:  An unsuccessful telephone outreach was attempted today to offer the patient information about available care coordination services as a benefit of their health plan.   Follow Up Plan:  Additional outreach attempts will be made to offer the patient care coordination information and services.   Encounter Outcome:  No Answer  Care Coordination Interventions Activated:  No   Care Coordination Interventions:  No, not indicated    SIG Letasha Kershaw L. Lavina Hamman, RN, BSN, [place] Coordinator Office number [phone]

## 2021-12-25 NOTE — Patient Instructions (Signed)
Visit Information  Thank you for taking time to visit with me today. Please don't hesitate to contact me if I can be of assistance to you.   Following are the goals we discussed today:   Goals Addressed               This Visit's Progress     Patient Stated     manage COPD/upper respiratory symptoms (pt-stated)   On track     Care Coordination Interventions: Discussed the importance of adequate rest and management of fatigue with COPD Assessed social determinant of health barriers  Encouraged handwashing and virus precautions  Confirmed he received the antibiotics from the pcp office that was called in to his pharmacy Assessed for any respiratory improvements- improved congestion Encouragement provided         Our next appointment is by telephone on 01/03/22 at 3 pm  Please call the care guide team at [phone] if you need to cancel or reschedule your appointment.   If you are experiencing a Mental Health or [place] or need someone to talk to, please call the Suicide and Crisis Lifeline: 988 call the [place] National Suicide Prevention Lifeline: 256-044-5410 or TTY: (509)268-3044 TTY 540-756-3191) to talk to a trained counselor call 1-800-273-TALK (toll free, 24 hour hotline) call the [facility]: (503)363-3079 call 911   Patient verbalizes understanding of instructions and care plan provided today and agrees to view in [place]. Active MyChart status and patient understanding of how to access instructions and care plan via MyChart confirmed with patient.     The patient has been provided with contact information for the care management team and has been advised to call with any health related questions or concerns.   [place] Lavina Hamman, RN, BSN, [place] Coordinator Office number [phone]

## 2021-12-25 NOTE — Patient Outreach (Signed)
  Care Coordination   Follow Up Visit Note   12/25/2021 Name: [name] MRN: [id number] DOB: Aug 15, 1960  [name] is a 61 y.o. year old male who sees Pickard, Cammie Mcgee, MD for primary care. I spoke with  [name] by phone today.  What matters to the patients health and wellness today?  Follow up care coordination for prescription call in for antibiotic for worsening respiratory symptoms.     Goals Addressed               This Visit's Progress     Patient Stated     manage COPD/upper respiratory symptoms (pt-stated)   On track     Care Coordination Interventions: Discussed the importance of adequate rest and management of fatigue with COPD Assessed social determinant of health barriers  Encouraged handwashing and virus precautions  Confirmed he received the antibiotics from the pcp office that was called in to his pharmacy Assessed for any respiratory improvements- improved congestion Encouragement provided         SDOH assessments and interventions completed:  Yes  SDOH Interventions Today    Flowsheet Row Most Recent Value  SDOH Interventions   Food Insecurity Interventions Intervention Not Indicated  Financial Strain Interventions Intervention Not Indicated  Transportation Interventions Intervention Not Indicated        Care Coordination Interventions Activated:  Yes  Care Coordination Interventions:  Yes, provided   Follow up plan: Follow up call scheduled for 01/03/22    Encounter Outcome:  Pt. Visit Completed   [name] L. Lavina Hamman, RN, BSN, [place] Coordinator Office number [phone]

## 2022-01-03 ENCOUNTER — Ambulatory Visit: Payer: Self-pay | Admitting: *Deleted

## 2022-01-03 NOTE — Patient Instructions (Addendum)
Visit Information  Thank you for taking time to visit with me today. Please don't hesitate to contact me if I can be of assistance to you.   Following are the goals we discussed today:   Goals Addressed               This Visit's Progress     Patient Stated     improve kidney functioning labs [facility]) (pt-stated)        Care Coordination Interventions: Reviewed scheduled/upcoming provider appointments including    Assessed for hydration, BPH symptoms. Educated how kidney functioning labs may become elevated. Encouraged hydration Sent education information on hydration, BPH and creatinine labs       manage COPD/upper respiratory symptoms ([facility]) (pt-stated)   On track     Care Coordination Interventions: Discussed the importance of adequate rest and management of fatigue with COPD Assessed social determinant of health barriers   Assessed for any respiratory improvements-  Encouragement provided         Our next appointment is by telephone on 01/31/22 at 2:30 pm  Please call the care guide team at [phone] if you need to cancel or reschedule your appointment.   If you are experiencing a Mental Health or [place] or need someone to talk to, please call the Suicide and Crisis Lifeline: 988 call the [place] National Suicide Prevention Lifeline: (228) 006-6277 or TTY: 608-203-5305 TTY 3372816821) to talk to a trained counselor call 1-800-273-TALK (toll free, 24 hour hotline) call the [facility]: 401-714-2589 call 911   Patient verbalizes understanding of instructions and care plan provided today and agrees to view in [place]. Active MyChart status and patient understanding of how to access instructions and care plan via MyChart confirmed with patient.     The patient has been provided with contact information for the care management team and has been advised to call with any health related questions or concerns.   [place] Lavina Hamman,  RN, BSN, [place] Coordinator Office number [phone]

## 2022-01-03 NOTE — Patient Outreach (Addendum)
  Care Coordination   Follow Up Visit Note   01/03/2022 Name: REESE STOCKMAN MRN: [id number] DOB: 31-May-1960  DELAN KSIAZEK is a 61 y.o. year old male who sees Pickard, Cammie Mcgee, MD for primary care. I spoke with  [name] by phone today.  What matters to the patients health and wellness today?  Elevation in kidney lab/urinary labs, Creatinine listed as 1.55  He confirms small amounts of urinary stream with voiding He discusses dehydration related to being outside for long periods of time He does have a lab follow up visit on 01/06/22  He reports he will work on remaining hydrated   Goals Addressed               This Visit's Progress     Patient Stated     improve kidney functioning labs [facility]) (pt-stated)        Care Coordination Interventions: Reviewed scheduled/upcoming provider appointments including    Assessed for hydration, BPH symptoms. Educated how kidney functioning labs may become elevated. Encouraged hydration Sent education information on hydration, BPH and creatinine labs       manage COPD/upper respiratory symptoms ([facility]) (pt-stated)   On track     Care Coordination Interventions: Discussed the importance of adequate rest and management of fatigue with COPD Assessed social determinant of health barriers   Assessed for any respiratory improvements-  Encouragement provided         SDOH assessments and interventions completed:  Yes     Care Coordination Interventions Activated:  Yes  Care Coordination Interventions:  Yes, provided   Follow up plan: Follow up call scheduled for 01/31/22 2:30 pm     Encounter Outcome:  Pt. Visit Completed   [place]amil[place] Jean L. Lavina Hamman, RN, BSN, [place] Coordinator Office number [phone]

## 2022-01-06 ENCOUNTER — Other Ambulatory Visit: Payer: Medicare HMO

## 2022-01-06 DIAGNOSIS — S37009A Unspecified injury of unspecified kidney, initial encounter: Secondary | ICD-10-CM | POA: Diagnosis not present

## 2022-01-07 ENCOUNTER — Other Ambulatory Visit: Payer: Self-pay

## 2022-01-07 DIAGNOSIS — N289 Disorder of kidney and ureter, unspecified: Secondary | ICD-10-CM

## 2022-01-07 LAB — BASIC METABOLIC PANEL
BUN/Creatinine Ratio: 10 (calc) (ref 6–22)
BUN: 16 mg/dL (ref 7–25)
CO2: 25 mmol/L (ref 20–32)
Calcium: 8.8 mg/dL (ref 8.6–10.3)
Chloride: 106 mmol/L (ref 98–110)
Creat: 1.53 mg/dL — ABNORMAL HIGH (ref 0.70–1.30)
Glucose, Bld: 136 mg/dL — ABNORMAL HIGH (ref 65–99)
Potassium: 4.9 mmol/L (ref 3.5–5.3)
Sodium: 142 mmol/L (ref 135–146)

## 2022-01-20 ENCOUNTER — Ambulatory Visit (HOSPITAL_COMMUNITY)
Admission: RE | Admit: 2022-01-20 | Discharge: 2022-01-20 | Disposition: A | Discharge status: Home / Self Care | Payer: Medicare HMO | Source: Ambulatory Visit | Attending: Family Medicine | Admitting: Family Medicine

## 2022-01-20 DIAGNOSIS — R944 Abnormal results of kidney function studies: Secondary | ICD-10-CM | POA: Diagnosis not present

## 2022-01-20 DIAGNOSIS — N289 Disorder of kidney and ureter, unspecified: Secondary | ICD-10-CM | POA: Diagnosis not present

## 2022-01-31 ENCOUNTER — Ambulatory Visit: Payer: Self-pay | Admitting: *Deleted

## 2022-01-31 NOTE — Patient Instructions (Addendum)
Visit Information  Thank you for taking time to visit with me today. Please don't hesitate to contact me if I can be of assistance to you.   Following are the goals we discussed today:   Goals Addressed               This Visit's Progress     Patient Stated     improve kidney functioning labs [facility]) (pt-stated)   Not on track     Care Coordination Interventions: Reviewed scheduled/upcoming provider appointments including    Assessed for any improvements in renal functioning. Reviewed 01/07/22 labs. NO improvements Noted further diagnostic testing ordered        manage COPD/upper respiratory symptoms ([facility]) (pt-stated)   On track     Care Coordination Interventions: Discussed the importance of adequate rest and management of fatigue with COPD Assessed social determinant of health barriers  Assessed for any respiratory improvements since last outreach- improvements  Encouragement provided       Manage right  shoulder pain ([facility]) (pt-stated)   Not on track     Care Coordination Interventions: Reviewed provider established plan for pain management Discussed importance of adherence to all scheduled medical appointments Discussed use of relaxation techniques and/or diversional activities to assist with pain reduction (distraction, imagery, relaxation, massage, acupressure, TENS, heat, and cold application Reviewed with patient prescribed pharmacological and nonpharmacological pain relief strategies        Our next appointment is by telephone on 03/03/22  at 2:30 pm  Please call the care guide team at [phone] if you need to cancel or reschedule your appointment.   If you are experiencing a Mental Health or [place] or need someone to talk to, please call the Suicide and Crisis Lifeline: 988 call the [place] National Suicide Prevention Lifeline: 930 135 3772 or TTY: 406 123 6585 TTY 501-103-7735) to talk to a trained counselor call 1-800-273-TALK (toll free, 24  hour hotline) call the [facility]: ([phone] call 911   Patient verbalizes understanding of instructions and care plan provided today and agrees to view in [place]. Active MyChart status and patient understanding of how to access instructions and care plan via MyChart confirmed with patient.     The patient has been provided with contact information for the care management team and has been advised to call with any health related questions or concerns.   [place] Lavina Hamman, RN, BSN, [place] Coordinator Office number [phone]

## 2022-01-31 NOTE — Patient Outreach (Signed)
  Care Coordination   Follow Up Visit Note   01/31/2022 Name: [name] MRN: [id number] DOB: 08/19/1960  [name] is a 61 y.o. year old male who sees Pickard, Cammie Mcgee, MD for primary care. I spoke with  [name] by phone today.  What matters to the patients health and wellness today?  Right Shoulder pain radiates from the back of his shoulder through to the frontal shoulder, exacerbated by active shoulder abduction when he also has radiation of pain to the arm to about the elbow and sometimes down into the hand.  Constant pain that causes poor sleep.  He had various second opinions He has tried lots of interventions like acupuncture, Ibuprofen until he noticed kidney function changes, PT  He voices he is unsure of how he injured his right shoulder  Renal functioning not improved- lab values remain the same  COPD managed  Cares for wife with dementia, continues having difficulty getting her to medical appointments She "acts out"  He is disabled and can not be lifting on her    Goals Addressed               This Visit's Progress     Patient Stated     improve kidney functioning labs [name]) (pt-stated)   Not on track     Care Coordination Interventions: Reviewed scheduled/upcoming provider appointments including    Assessed for any improvements in renal functioning. Reviewed 01/07/22 labs. NO improvements Noted further diagnostic testing ordered        manage COPD/upper respiratory symptoms ([facility]) (pt-stated)   On track     Care Coordination Interventions: Discussed the importance of adequate rest and management of fatigue with COPD Assessed social determinant of health barriers  Assessed for any respiratory improvements since last outreach- improvements  Encouragement provided       Manage right  shoulder pain ([facility]) (pt-stated)   Not on track     Care Coordination Interventions: Reviewed provider established plan for pain management Discussed importance of  adherence to all scheduled medical appointments Discussed use of relaxation techniques and/or diversional activities to assist with pain reduction (distraction, imagery, relaxation, massage, acupressure, TENS, heat, and cold application Reviewed with patient prescribed pharmacological and nonpharmacological pain relief strategies        SDOH assessments and interventions completed:  No     Care Coordination Interventions Activated:  Yes  Care Coordination Interventions:  Yes, provided   Follow up plan: Follow up call scheduled for 03/03/22    Encounter Outcome:  Pt. Visit Completed   Allen Tadesse L. Lavina Hamman, RN, BSN, [place] Coordinator Office number [phone]

## 2022-02-07 ENCOUNTER — Ambulatory Visit (INDEPENDENT_AMBULATORY_CARE_PROVIDER_SITE_OTHER): Payer: Medicare HMO

## 2022-02-07 VITALS — BP 142/74 | HR 60

## 2022-02-07 DIAGNOSIS — Z23 Encounter for immunization: Secondary | ICD-10-CM

## 2022-02-07 DIAGNOSIS — E782 Mixed hyperlipidemia: Secondary | ICD-10-CM

## 2022-02-07 DIAGNOSIS — N289 Disorder of kidney and ureter, unspecified: Secondary | ICD-10-CM

## 2022-02-21 ENCOUNTER — Ambulatory Visit
Admission: RE | Admit: 2022-02-21 | Discharge: 2022-02-21 | Disposition: A | Discharge status: Home / Self Care | Payer: Medicare HMO | Source: Ambulatory Visit | Attending: Nurse Practitioner | Admitting: Nurse Practitioner

## 2022-02-21 ENCOUNTER — Ambulatory Visit (INDEPENDENT_AMBULATORY_CARE_PROVIDER_SITE_OTHER): Payer: Medicare HMO

## 2022-02-21 VITALS — BP 130/84 | HR 55 | Temp 97.9°F | Resp 18

## 2022-02-21 DIAGNOSIS — M79641 Pain in right hand: Secondary | ICD-10-CM

## 2022-02-21 DIAGNOSIS — M19041 Primary osteoarthritis, right hand: Secondary | ICD-10-CM | POA: Diagnosis not present

## 2022-02-21 NOTE — Discharge Instructions (Addendum)
Hand x-ray today shows arthritis.  Please take Tylenol [phone] mg every 8 hours as needed for pain.  Also recommend ACE wrap or hand compression sleeve to help with swelling and stabilize the joint.  Follow up with PCP with no improvement in symptoms.

## 2022-02-21 NOTE — ED Triage Notes (Signed)
Pt reports hand pain for 2 weeks now. Didn't take any meds for relief. Didn't cause no physical harm to it just woke up like that.  Pain shooting down from his right arm

## 2022-02-21 NOTE — ED Provider Notes (Signed)
[facility]    CSN: [id number] Arrival date & time: 02/21/22  0858      History   Chief Complaint Chief Complaint  Patient presents with   Hand Problem    Entered by patient    HPI [name] is a 61 y.o. male.   Patient presents for right hand pain that has been ongoing for the past couple of weeks.  Denies recent accident, fall, injury, or trauma to the hand.  Reports the hand hurts around his first finger and feels like pins-and-needles down into the tip of his finger.  Pain is worse with movement or with touching it.  No weakness, numbness, redness, bruising, or fevers, nausea/vomiting since the pain started.  He does endorse swelling around the joint that is painful.  Has not taken anything for the pain so far.  Patient was recently treated for COPD exacerbation in August 2023 with prednisone and azithromycin.      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   Mixed hyperlipidemia 02/23/2017   Peripheral vascular disease ([place]) 06/01/2016   Nasolacrimal duct obstruction, acquired, right 03/11/2016   Medication side effects 06/28/2014   Chronic obstructive pulmonary disease ([place]) 05/24/2013   Coronary artery disease 06/14/2012   Chronic, continuous use of opioids 06/14/2012   Bowel incontinence  19/37/9024   Umbilical hernia 09/73/5329   Increased frequency of urination 12/11/2011   Prostatitis 11/13/2011   Atherosclerosis of native arteries of the extremities with intermittent claudication 10/03/2011   Atherosclerosis of native artery of extremity with intermittent claudication ([place]) 10/03/2011   Fecal smearing 09/26/2011   Acquired hindfoot varus 08/12/2011   Instability of ankle joint 08/12/2011   Painful sternal wire 06/30/2011   Encounter for therapeutic drug monitoring 04/06/2011   Sural neuritis 04/06/2011   Ankle pain 01/31/2011   Peripheral neuropathy 01/31/2011   Chronic right shoulder pain 01/30/2011   Leg cramps 11/12/2010   Dyspnea    Orthostatic hypotension    History of syncope     Past Surgical History:  Procedure Laterality Date   ANKLE SURGERY     x 2   COLONOSCOPY  April 2013   CORONARY ANGIOPLASTY     CORONARY ARTERY BYPASS GRAFT  2010   FOOT SURGERY     HERNIA REPAIR  01/23/12   umb hernia   Kidney stimulator     LEFT HEART CATH AND CORS/GRAFTS ANGIOGRAPHY N/A 06/02/2016   Procedure: Left Heart Cath and Cors/Grafts Angiography;  Surgeon: Lorretta Harp, MD;  Location: [facility];  Service: Cardiovascular;  Laterality: N/A;   LEFT HEART CATHETERIZATION WITH CORONARY/GRAFT ANGIOGRAM N/A 07/08/2012   Procedure: LEFT HEART CATHETERIZATION WITH Beatrix Fetters;  Surgeon: Thayer Headings, MD;  Location[facility];  Service: Cardiovascular;  Laterality:  N/A;   PR VEIN BYPASS GRAFT,AORTO-FEM-POP     STERNAL WIRES REMOVAL  07/08/2011   Procedure: STERNAL WIRES REMOVAL;  Surgeon: Grace Isaac, MD;  Location: [facility];  Service: Open Heart Surgery;  Laterality: N/A;  removal of 4th wire       Home Medications    Prior to Admission medications   Medication Sig Start Date End Date Taking? Authorizing Provider  albuterol (VENTOLIN HFA) 108 (90 Base) MCG/ACT inhaler INHALE 2 PUFFS INTO THE LUNGS EVERY 4 HOURS AS NEEDED FOR WHEEZING OR  SHORTNESS OF BREATH 02/16/20   Susy Frizzle, MD  amLODipine (NORVASC) 10 MG tablet TAKE 1 TABLET(10 MG) BY MOUTH DAILY 06/17/21   Nahser, Wonda Cheng, MD  aspirin EC 81 MG tablet Take 1 tablet (81 mg total) by mouth every morning. 11/07/16   Susy Frizzle, MD  atorvastatin (LIPITOR) 80 MG tablet Take 1 tablet (80 mg total) by mouth at bedtime. PT NEEDS OV FOR FURTHER REFILLS, NOT SEEN SINCE 2021 06/18/21   Susy Frizzle, MD  cloNIDine (CATAPRES) 0.1 MG tablet  11/17/18   [provider]  clopidogrel (PLAVIX) 75 MG tablet TAKE 1 TABLET(75 MG) BY MOUTH DAILY 06/03/21   Nahser, Wonda Cheng, MD  cyclobenzaprine (FLEXERIL) 10 MG tablet Take 1 tablet (10 mg total) by mouth 2 (two) times daily as needed for muscle spasms. 06/04/21   Mordecai Rasmussen, MD  Evolocumab (REPATHA SURECLICK) 440 MG/ML SOAJ Inject 140 mg into the skin every 14 (fourteen) days. 12/03/21   Nahser, Wonda Cheng, MD  folic acid (FOLVITE) 1 MG tablet Take by mouth. 05/19/21   [provider]  furosemide (LASIX) 20 MG tablet TAKE 1 TABLET BY MOUTH 3 TIMES A WEEK AS NEEDED 07/12/21   Nahser, Wonda Cheng, MD  gabapentin (NEURONTIN) 800 MG tablet Take 800 mg by mouth 3 (three) times daily. 03/22/21   [provider]  hydrochlorothiazide (HYDRODIURIL) 25 MG tablet Take 1 tablet (25 mg total) by mouth daily. Pt past due for 6 month follow up; please call for appt 02/16/20   Susy Frizzle, MD  hydroxyurea (HYDREA) 500 MG capsule Take by mouth. 05/19/21   [provider]  ibuprofen (ADVIL) 800 MG tablet Take 1 tablet (800 mg total) by mouth 3 (three) times daily. Take with food 04/15/21   Triplett, Tammy, PA-C  losartan (COZAAR) 100 MG tablet TAKE 1 TABLET BY MOUTH DAILY 06/17/21   Nahser, Wonda Cheng, MD  losartan (COZAAR) 100 MG tablet 100 mg.    [provider]  methocarbamol (ROBAXIN) 500 MG tablet Take 1 tablet (500 mg total) by mouth 2 (two) times daily. 02/16/20   Susy Frizzle, MD  omeprazole  (PRILOSEC) 40 MG capsule Take 1 capsule by mouth daily.    [provider]  oxyCODONE-acetaminophen (PERCOCET) 10-325 MG tablet Take 1 tablet by mouth every 6 (six) hours as needed. 04/25/21   [provider]  OXYCONTIN 20 MG 12 hr tablet Take 20 mg by mouth 2 (two) times daily. 03/26/21   [provider]  XTAMPZA ER 18 MG C12A Take by mouth. 06/11/21   [provider]    Family History Family History  Problem Relation Age of Onset   Coronary artery disease Father 6   Diabetes Father    Hyperlipidemia Father    Heart disease Father        before age 73   Hypertension Father    Cancer Mother  uterine   Hyperlipidemia Mother    Diabetes Mother    Hypertension Mother    Hypertension Brother     Social History Social History   Tobacco Use   Smoking status: Former    Packs/day: 3.00    Years: 30.00    Total pack years: 90.00    Types: Cigarettes, Cigars    Quit date: 01/26/2009    Years since quitting: 13.0   Smokeless tobacco: Never  Vaping Use   Vaping Use: Never used  Substance Use Topics   Alcohol use: No   Drug use: No     Allergies   Rosuvastatin   Review of Systems Review of Systems Per HPI  Physical Exam Triage Vital Signs ED Triage Vitals  Enc Vitals Group     BP 02/21/22 0918 130/84     Pulse Rate 02/21/22 0918 (!) 55     Resp 02/21/22 0918 18     Temp 02/21/22 0918 97.9 F (36.6 C)     Temp Source 02/21/22 0918 Oral     SpO2 02/21/22 0918 98 %     Weight --      Height --      Head Circumference --      Peak Flow --      Pain Score 02/21/22 0925 9     Pain Loc --      Pain Edu? --      Excl. in [place]? --    No data found.  Updated Vital Signs BP 130/84 (BP Location: Right Arm)   Pulse (!) 55   Temp 97.9 F (36.6 C) (Oral)   Resp 18   SpO2 98%   Visual Acuity Right Eye Distance:   Left Eye Distance:   Bilateral Distance:    Right Eye Near:   Left Eye Near:    Bilateral Near:      Physical Exam Vitals and nursing note reviewed.  Constitutional:      General: He is not in acute distress.    Appearance: Normal appearance. He is not toxic-appearing.  Pulmonary:     Effort: Pulmonary effort is normal. No respiratory distress.  Musculoskeletal:       Hands:     Right lower leg: No edema.     Left lower leg: No edema.     Comments: Inspection: Mild swelling to the right second MCP joint; no obvious deformity or redness Palpation: Right second MCP joint tender to palpation; no obvious deformities palpated ROM: Full ROM to all 5 right digits, wrist, elbow Strength: 5/5 left extremity Neurovascular: neurovascularly intact in right upper extremity    Skin:    General: Skin is warm and dry.     Capillary Refill: Capillary refill takes less than 2 seconds.     Coloration: Skin is not jaundiced or pale.     Findings: No erythema.  Neurological:     Mental Status: He is alert and oriented to person, place, and time.  Psychiatric:        Behavior: Behavior is cooperative.      UC Treatments / Results  Labs (all labs ordered are listed, but only abnormal results are displayed) Labs Reviewed - No data to display  EKG   Radiology DG Hand Complete Right  Result Date: 02/21/2022 CLINICAL DATA:  61 year old male with pain for 2 weeks, no known injury. First MCP joint pain. EXAM: RIGHT HAND - COMPLETE 3+ VIEW COMPARISON:  None Available. FINDINGS: Distal radius and ulna appear intact.  Carpal bone and CMC joint spaces and alignment maintained. Bulky chronic IP joint degeneration at the 1st IP joint, 2nd D IP. Less pronounced metacarpal joint degeneration at the thumb and 3rd finger. No acute osseous abnormality identified. No discrete soft tissue abnormality. IMPRESSION: Osteoarthritis, including at the thumb MCP joint. No acute osseous abnormality identified. Electronically Signed   By: Genevie Ann M.D.   On: 02/21/2022 09:55    Procedures Procedures (including critical  care time)  Medications Ordered in UC Medications - No data to display  Initial Impression / Assessment and Plan / UC Course  I have reviewed the triage vital signs and the nursing notes.  Pertinent labs & imaging results that were available during my care of the patient were reviewed by me and considered in my medical decision making (see chart for details).   Patient is well-appearing, normotensive, afebrile, not tachycardic, not tachypneic, oxygenating well on room air.    Right hand pain Osteoarthritis of right hand, unspecified osteoarthritis type Hand x-ray today shows osteoarthritis of multiple joints of the hand We will treat pain with Tylenol 500 to 1000 mg every 8 hours as needed for pain Patient is unable to take NSAIDs secondary to kidney function Ace wrap applied to help with swelling and provide compression Follow-up with PCP with no improvement in symptoms  The patient was given the opportunity to ask questions.  All questions answered to their satisfaction.  The patient is in agreement to this plan.    Final Clinical Impressions(s) / UC Diagnoses   Final diagnoses:  Right hand pain  Osteoarthritis of right hand, unspecified osteoarthritis type     Discharge Instructions      Hand x-ray today shows arthritis.  Please take Tylenol [phone] mg every 8 hours as needed for pain.  Also recommend ACE wrap or hand compression sleeve to help with swelling and stabilize the joint.  Follow up with PCP with no improvement in symptoms.     ED Prescriptions   None    PDMP not reviewed this encounter.   Eulogio Bear, NP 02/21/22 1007

## 2022-03-03 ENCOUNTER — Ambulatory Visit: Payer: Self-pay | Admitting: *Deleted

## 2022-03-03 NOTE — Patient Outreach (Signed)
  Care Coordination   03/03/2022 Name: TRANELL WOJTKIEWICZ MRN: [id number] DOB: [birth year]   Care Coordination Outreach Attempts:  An unsuccessful telephone outreach was attempted today to offer the patient information about available care coordination services as a benefit of their health plan.   Follow Up Plan:  Additional outreach attempts will be made to offer the patient care coordination information and services.   Encounter Outcome:  No Answer  Care Coordination Interventions Activated:  No   Care Coordination Interventions:  No, not indicated     Rondalyn Belford L. Lavina Hamman, RN, BSN, [place] Coordinator Office number [phone]

## 2022-03-04 DIAGNOSIS — M25572 Pain in left ankle and joints of left foot: Secondary | ICD-10-CM | POA: Diagnosis not present

## 2022-03-04 DIAGNOSIS — M79672 Pain in left foot: Secondary | ICD-10-CM | POA: Diagnosis not present

## 2022-03-06 ENCOUNTER — Telehealth: Payer: Self-pay | Admitting: *Deleted

## 2022-03-06 NOTE — Patient Outreach (Signed)
  Care Coordination   Return call from patient  Visit Note   03/06/2022 Name: [name] MRN: [id number] DOB: 1960/08/16  [name] is a 61 y.o. year old male who sees Pickard, Cammie Mcgee, MD for primary care. I spoke with  [name] by phone today.  What matters to the patients health and wellness today?  [name] returned a call to RN CM. 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 He reports he had been asked previously to have a pain stimulator removed prior to any further request for MRIs. He reports Dr Amalia Hailey removed this device and now he would like to get a MRI of the right shoulder   Goals Addressed               This Visit's Progress     Patient Stated     Manage right  shoulder pain ([facility]) (pt-stated)        Care Coordination Interventions: Reviewed provider established plan for pain management Discussed importance of adherence to all scheduled medical appointments Discussed use of relaxation techniques and/or diversional activities to assist with pain reduction (distraction, imagery, relaxation, massage, acupressure, TENS, heat, and cold application Reviewed with patient prescribed pharmacological and nonpharmacological pain relief strategies Reviewed recent ED visit  Outreach to [place] to Dr Amalia Hailey office spoke with [name] to request a referral for patient         SDOH assessments and interventions completed:  No     Care Coordination Interventions Activated:  Yes  Care Coordination Interventions:  Yes, provided   Follow up plan: Follow up call scheduled for 03/12/22 3 pm    Encounter Outcome:  Pt. Visit Completed   Allen Tupou L. Lavina Hamman, RN, BSN, [place] Coordinator Office number [phone]

## 2022-03-06 NOTE — Patient Outreach (Signed)
  Care Coordination   Primary care office collaboration  Visit Note   03/06/2022 Name: [name] MRN: [id number] DOB: [birth year]  [name] is a 61 y.o. year old male who sees [name], Allen Mcgee, MD for primary care. I  left a message for the covering nurse for Allen [name] & spoke with [name] to update him briefly  What matters to the patients health and wellness today?  [name] left 2 messages to report he received a call from [name] office. He reports they are not able to assist with ordering a MRI of the right shoulder and refers [name] back to his pcp, [name]  [name] was updated    Goals Addressed               This Visit's Progress     Patient Stated     Manage right  shoulder pain [facility]) (pt-stated)   Not on track     Care Coordination Interventions: Outreach to pcp office after messages from patient. Left message for Allen [name] covering nurse [name] Updated [name] of the pcp office collaboration. Encouraged an outreach from him if he received a call and RN CM will call him if further updates arise before scheduled follow call         SDOH assessments and interventions completed:  No     Care Coordination Interventions Activated:  Yes  Care Coordination Interventions:  Yes, provided   Follow up plan: Follow up call scheduled for 03/12/22    Encounter Outcome:  Pt. Visit Completed   [name] L. Lavina Hamman, RN, BSN, [place] Coordinator Office number [phone]

## 2022-03-06 NOTE — Patient Instructions (Signed)
Visit Information  Thank you for taking time to visit with me today. Please don't hesitate to contact me if I can be of assistance to you.   Following are the goals we discussed today:   Goals Addressed               This Visit's Progress     Patient Stated     Manage right  shoulder pain ([facility]) (pt-stated)   Not on track     Care Coordination Interventions: Outreach to pcp office after messages from patient. Left message for Dr Samella Parr covering nurse Lyman Bishop Updated Mr Moody of the pcp office collaboration. Encouraged an outreach from him if he received a call and RN CM will call him if further updates arise before scheduled follow call         Our next appointment is by telephone on 03/12/22 at 3 pm  Please call the care guide team at [phone] if you need to cancel or reschedule your appointment.   If you are experiencing a Mental Health or [place] or need someone to talk to, please call the Suicide and Crisis Lifeline: 988 call the [place] National Suicide Prevention Lifeline: 972-093-7884 or TTY: 231 816 0685 TTY (548)570-1948) to talk to a trained counselor call 1-800-273-TALK (toll free, 24 hour hotline) go to [facility] [address] [place] [phone]) call 911   Patient verbalizes understanding of instructions and care plan provided today and agrees to view in [place]. Active MyChart status and patient understanding of how to access instructions and care plan via MyChart confirmed with patient.     The patient has been provided with contact information for the care management team and has been advised to call with any health related questions or concerns.   [name] L. Lavina Hamman, RN, BSN, [place] Coordinator Office number [phone]

## 2022-03-06 NOTE — Patient Instructions (Signed)
Visit Information  Thank you for taking time to visit with me today. Please don't hesitate to contact me if I can be of assistance to you.   Following are the goals we discussed today:   Goals Addressed               This Visit's Progress     Patient Stated     Manage right  shoulder pain ([facility]) (pt-stated)        Care Coordination Interventions: Reviewed provider established plan for pain management Discussed importance of adherence to all scheduled medical appointments Discussed use of relaxation techniques and/or diversional activities to assist with pain reduction (distraction, imagery, relaxation, massage, acupressure, TENS, heat, and cold application Reviewed with patient prescribed pharmacological and nonpharmacological pain relief strategies Reviewed recent ED visit  Outreach to [place] to Dr Amalia Hailey office spoke with Janeal Holmes to request a referral for patient         Our next appointment is by telephone on 03/12/22 at 3 pm  Please call the care guide team at [phone] if you need to cancel or reschedule your appointment.   If you are experiencing a Mental Health or [place] or need someone to talk to, please call the Suicide and Crisis Lifeline: 988 call the [place] National Suicide Prevention Lifeline: 610-555-5927 or TTY: (618)120-8721 TTY (276)825-0283) to talk to a trained counselor call 1-800-273-TALK (toll free, 24 hour hotline) go to [facility] [address], [place] [phone]) call 911   Patient verbalizes understanding of instructions and care plan provided today and agrees to view in [place]. Active MyChart status and patient understanding of how to access instructions and care plan via MyChart confirmed with patient.     The patient has been provided with contact information for the care management team and has been advised to call with any health related questions or  concerns.   Mirren Gest L. Lavina Hamman, RN, BSN, [place] Coordinator Office number [phone]

## 2022-03-12 ENCOUNTER — Ambulatory Visit: Payer: Self-pay | Admitting: *Deleted

## 2022-03-12 ENCOUNTER — Telehealth: Payer: Self-pay

## 2022-03-12 NOTE — Telephone Encounter (Signed)
Jackelyn Poling RN Case Manager called in for pt to request a referral for an MRI of pt's shoulder. Please advise..  Cb#: [phone]

## 2022-03-12 NOTE — Patient Instructions (Signed)
Visit Information  Thank you for taking time to visit with me today. Please don't hesitate to contact me if I can be of assistance to you.   Following are the goals we discussed today:   Goals Addressed               This Visit's Progress     Patient Stated     Manage right  shoulder pain ([facility]) (pt-stated)   Not on track     Care Coordination Interventions: Outreach to patient to check on possible out reach from his pcp office -none  pcp office collaboration- outreach to [place] to request a referral for patient to have a MRI for his right shoulder. Discussed the previous message left for Aleeya. Staff was not able to find a message left  Routed 01/31/22 note to pcp         Our next appointment is by telephone on 03/18/22 at 1100  Please call the care guide team at [phone] if you need to cancel or reschedule your appointment.   If you are experiencing a Mental Health or [place] or need someone to talk to, please call the Suicide and Crisis Lifeline: 988 call the [place] National Suicide Prevention Lifeline: (815)145-0929 or TTY: 8130779736 TTY 613 409 0666) to talk to a trained counselor call 1-800-273-TALK (toll free, 24 hour hotline) call the [facility]: [phone] call 911   Patient verbalizes understanding of instructions and care plan provided today and agrees to view in [place]. Active MyChart status and patient understanding of how to access instructions and care plan via MyChart confirmed with patient.     The patient has been provided with contact information for the care management team and has been advised to call with any health related questions or concerns.   Shailee Foots L. Lavina Hamman, RN, BSN, [place] Coordinator Office number [phone]

## 2022-03-12 NOTE — Patient Outreach (Signed)
  Care Coordination   Follow Up Visit Note   03/12/2022 Name: [name] MRN: [id number] DOB: Apr 10, 1961  [name] is a 61 y.o. year old male who sees Pickard, Cammie Mcgee, MD for primary care. I spoke with  [name] by phone today.  What matters to the patients health and wellness today?  Continues with shoulder pain  No response per patient about a referral for MRI of his right shoulder   Goals Addressed               This Visit's Progress     Patient Stated     Manage right  shoulder pain ([facility]N) (pt-stated)   Not on track     Care Coordination Interventions: Outreach to patient to check on possible out reach from his pcp office -none  pcp office collaboration- outreach to [place] to request a referral for patient to have a MRI for his right shoulder. Discussed the previous message left for Aleeya. Staff was not able to find a message left  Routed 01/31/22 note to pcp         SDOH assessments and interventions completed:  No     Care Coordination Interventions Activated:  Yes  Care Coordination Interventions:  Yes, provided   Follow up plan: Follow up call scheduled for 03/18/22    Encounter Outcome:  Pt. Visit Completed    Ariane Ditullio L. Lavina Hamman, RN, BSN, [place] Coordinator Office number [phone]

## 2022-03-18 ENCOUNTER — Ambulatory Visit: Payer: Self-pay | Admitting: *Deleted

## 2022-03-18 ENCOUNTER — Encounter: Payer: Self-pay | Admitting: *Deleted

## 2022-03-18 NOTE — Patient Outreach (Signed)
  Care Coordination   Follow Up Visit Note   03/18/2022 Name: [name] MRN: [id number] DOB: January 26, 1961  [name] is a 61 y.o. year old male who sees Pickard, Cammie Mcgee, MD for primary care. I spoke with  [name] by phone today.  What matters to the patients health and wellness today?  Shoulder pain update  He reports he will outreach to the office to get an appointment to have his shoulder evaluated    Goals Addressed               This Visit's Progress     Patient Stated     Manage right  shoulder pain ([facility]N) (pt-stated)   Not on track     Care Coordination Interventions:   Updated patient on the pcp office collaboration- outreach to [place] to request a referral for patient to have a MRI for his right shoulder. Encouraged patient to outreach to the office to make an earlier appointment for evaluation of his shoulder        SDOH assessments and interventions completed:  No     Care Coordination Interventions Activated:  Yes  Care Coordination Interventions:  Yes, provided   Follow up plan: Follow up call scheduled for 04/17/22 1100    Encounter Outcome:  Pt. Visit Completed   Orel Cooler L. Lavina Hamman, RN, BSN, [place] Coordinator Office number [phone]

## 2022-03-18 NOTE — Patient Instructions (Signed)
Visit Information  Thank you for taking time to visit with me today. Please don't hesitate to contact me if I can be of assistance to you.   Following are the goals we discussed today:   Goals Addressed               This Visit's Progress     Patient Stated     Manage right  shoulder pain (THN) (pt-stated)   Not on track     Care Coordination Interventions:   Updated patient on the pcp office collaboration- outreach to [facility] to request a referral for patient to have a MRI for his right shoulder. Encouraged patient to outreach to the office to make an earlier appointment for evaluation of his shoulder        Our next appointment is by telephone on 04/17/22 at 1100  Please call the care guide team at [phone] if you need to cancel or reschedule your appointment.   If you are experiencing a Mental Health or [place] or need someone to talk to, please call the Suicide and Crisis Lifeline: 988 call the [place] National Suicide Prevention Lifeline: 928-387-5356 or TTY: 8592921922 TTY (907)629-8245) to talk to a trained counselor call 1-800-273-TALK (toll free, 24 hour hotline) go to [facility] [address], [place] [phone]) call 911   Patient verbalizes understanding of instructions and care plan provided today and agrees to view in [place]. Active MyChart status and patient understanding of how to access instructions and care plan via MyChart confirmed with patient.     The patient has been provided with contact information for the care management team and has been advised to call with any health related questions or concerns.   [name] L. Lavina Hamman, RN, BSN, [place] Coordinator Office number [phone]

## 2022-03-31 ENCOUNTER — Encounter: Payer: Self-pay | Admitting: Family Medicine

## 2022-03-31 ENCOUNTER — Ambulatory Visit (INDEPENDENT_AMBULATORY_CARE_PROVIDER_SITE_OTHER): Payer: Medicare HMO | Admitting: Family Medicine

## 2022-03-31 VITALS — BP 126/78 | HR 52 | Ht 69.0 in | Wt 260.0 lb

## 2022-03-31 DIAGNOSIS — R29898 Other symptoms and signs involving the musculoskeletal system: Secondary | ICD-10-CM

## 2022-03-31 DIAGNOSIS — M4802 Spinal stenosis, cervical region: Secondary | ICD-10-CM | POA: Diagnosis not present

## 2022-03-31 DIAGNOSIS — M5412 Radiculopathy, cervical region: Secondary | ICD-10-CM | POA: Diagnosis not present

## 2022-03-31 NOTE — Progress Notes (Signed)
Subjective:    Patient ID: [name], male    DOB: Apr 08, 1961, 60 y.o.   MRN: [id number]  Patient went to the emergency room in December 2022 for right shoulder pain and pain in his right hand.  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  At that point he was referred to orthopedic surgery.  Repeated surgery.  Injection in his right shoulder for possible supplement bursitis and saw no benefit.  He was then referred to an orthopedist in [place] who felt the majority of his symptoms were due to cervical radiculopathy.  This was in March 2023.  They recommended epidural steroid injections in his cervical spine however the patient had what sounds like trigger point injections performed and then was lost to follow-up.  Today he complains of pain radiating from his right shoulder all the way down his right arm into his right tricep, right forearm and numbness in his ring finger.  He has decreased grip strength in his right hand..  However wrist dorsiflexion and wrist volar flexion are not affected.  The intrinsic muscles of the hand do not seem to be infected.  He does complain of decreased sensation in his right index finger.  He also has significant pain with abduction of his right shoulder and internal rotation of his right shoulder however is negative he can his pain abduct greater than degree that he has tried physical therapy for his shoulder and his neck with no benefit.  He is here today requesting that I "cut his arm off" because the pain is so severe.  He states he has been taking a "boatload" of ibuprofen due to the pain and that is affecting his kidney function Past Medical History:  Diagnosis Date   Acute MI, inferior wall ([place]) 04/2009   Chest pain    COPD (chronic obstructive pulmonary disease) ([place])    Coronary artery disease    STATUS POST CABG 01/2009   Dyslipidemia    Hyperlipidemia    Hypertension    Orthostatic  hypotension    Peripheral vascular disease (HCC)    Syncope and collapse    Past Surgical History:  Procedure Laterality Date   ANKLE SURGERY     x 2   COLONOSCOPY  April 2013   CORONARY ANGIOPLASTY     CORONARY ARTERY BYPASS GRAFT  2010   FOOT SURGERY     HERNIA REPAIR  01/23/12   umb hernia   Kidney stimulator     LEFT HEART CATH AND CORS/GRAFTS ANGIOGRAPHY N/A 06/02/2016   Procedure: Left Heart Cath and Cors/Grafts Angiography;  Surgeon: Lorretta Harp, MD;  Location: [facility];  Service: Cardiovascular;  Laterality: N/A;   LEFT HEART CATHETERIZATION WITH CORONARY/GRAFT ANGIOGRAM N/A 07/08/2012   Procedure: LEFT HEART CATHETERIZATION WITH Beatrix Fetters;  Surgeon: Thayer Headings, MD;  Location: [facility];  Service: Cardiovascular;  Laterality: N/A;   PR VEIN BYPASS GRAFT,AORTO-FEM-POP     STERNAL WIRES REMOVAL  07/08/2011   Procedure: STERNAL WIRES REMOVAL;  Surgeon: Grace Isaac, MD;  Location: [facility];  Service: Open Heart Surgery;  Laterality: N/A;  removal of 4th wire   Current Outpatient Medications on File Prior to Visit  Medication Sig Dispense Refill   albuterol (VENTOLIN HFA) 108 (90 Base) MCG/ACT inhaler INHALE 2 PUFFS INTO THE LUNGS EVERY 4 HOURS AS NEEDED FOR WHEEZING OR SHORTNESS OF BREATH 42.5 g 3   amLODipine (NORVASC)  10 MG tablet TAKE 1 TABLET(10 MG) BY MOUTH DAILY 90 tablet 3   aspirin EC 81 MG tablet Take 1 tablet (81 mg total) by mouth every morning. 90 tablet 3   atorvastatin (LIPITOR) 80 MG tablet Take 1 tablet (80 mg total) by mouth at bedtime. PT NEEDS OV FOR FURTHER REFILLS, NOT SEEN SINCE 2021 30 tablet 0   cloNIDine (CATAPRES) 0.1 MG tablet      clopidogrel (PLAVIX) 75 MG tablet TAKE 1 TABLET(75 MG) BY MOUTH DAILY 90 tablet 3   cyclobenzaprine (FLEXERIL) 10 MG tablet Take 1 tablet (10 mg total) by mouth 2 (two) times daily as needed for muscle spasms. 20 tablet 0   Evolocumab (REPATHA SURECLICK) 956 MG/ML SOAJ Inject 140 mg into the  skin every 14 (fourteen) days. 6 mL 3   folic acid (FOLVITE) 1 MG tablet Take by mouth.     furosemide (LASIX) 20 MG tablet TAKE 1 TABLET BY MOUTH 3 TIMES A WEEK AS NEEDED 45 tablet 3   gabapentin (NEURONTIN) 800 MG tablet Take 800 mg by mouth 3 (three) times daily.     hydrochlorothiazide (HYDRODIURIL) 25 MG tablet Take 1 tablet (25 mg total) by mouth daily. Pt past due for 6 month follow up; please call for appt 90 tablet 3   hydroxyurea (HYDREA) 500 MG capsule Take by mouth.     ibuprofen (ADVIL) 800 MG tablet Take 1 tablet (800 mg total) by mouth 3 (three) times daily. Take with food 21 tablet 0   losartan (COZAAR) 100 MG tablet TAKE 1 TABLET BY MOUTH DAILY 90 tablet 3   losartan (COZAAR) 100 MG tablet 100 mg.     methocarbamol (ROBAXIN) 500 MG tablet Take 1 tablet (500 mg total) by mouth 2 (two) times daily. 20 tablet 4   omeprazole (PRILOSEC) 40 MG capsule Take 1 capsule by mouth daily.     oxyCODONE-acetaminophen (PERCOCET) 10-325 MG tablet Take 1 tablet by mouth every 6 (six) hours as needed.     OXYCONTIN 20 MG 12 hr tablet Take 20 mg by mouth 2 (two) times daily.     XTAMPZA ER 18 MG C12A Take by mouth.     No current facility-administered medications on file prior to visit.   Allergies  Allergen Reactions   Rosuvastatin     Muscle aches. Other reaction(s): leg pain, Other Muscle aches. Other reaction(s): leg pain Other reaction(s): leg pain    Social History   Socioeconomic History   Marital status: Married    Spouse name: [name]   Number of children: 2   Years of education: Not on file   Highest education level: Not on file  Occupational History   Occupation: Disability  Tobacco Use   Smoking status: Former    Packs/day: 3.00    Years: 30.00    Total pack years: 90.00    Types: Cigarettes, Cigars    Quit date: 01/26/2009    Years since quitting: 13.1   Smokeless tobacco: Never  Vaping Use   Vaping Use: Never used  Substance and Sexual Activity   Alcohol use:  No   Drug use: No   Sexual activity: Not Currently  Other Topics Concern   Not on file  Social History Narrative   Married x 37 years.   Social Determinants of Health   Financial Resource Strain: Low Risk  (12/25/2021)   Overall Financial Resource Strain (CARDIA)    Difficulty of Paying Living Expenses: Not hard at all  Food Insecurity:  No Food Insecurity (12/25/2021)   Hunger Vital Sign    Worried About Running Out of Food in the Last Year: Never true    Ran Out of Food in the Last Year: Never true  Transportation Needs: No Transportation Needs (12/25/2021)   PRAPARE - Hydrologist (Medical): No    Lack of Transportation (Non-Medical): No  Physical Activity: Insufficiently Active (05/02/2021)   Exercise Vital Sign    Days of Exercise per Week: 5 days    Minutes of Exercise per Session: 20 min  Stress: No Stress Concern Present (05/02/2021)   [place]    Feeling of Stress : Not at all  Social Connections: Moderately Isolated (05/02/2021)   Social Connection and Isolation Panel [NHANES]    Frequency of Communication with Friends and Family: More than three times a week    Frequency of Social Gatherings with Friends and Family: More than three times a week    Attends Religious Services: Never    Marine scientist or Organizations: No    Attends Archivist Meetings: Never    Marital Status: Married  Human resources officer Violence: Not At Risk (05/02/2021)   Humiliation, Afraid, Rape, and Kick questionnaire    Fear of Current or Ex-Partner: No    Emotionally Abused: No    Physically Abused: No    Sexually Abused: No   Family History  Problem Relation Age of Onset   Coronary artery disease Father 29   Diabetes Father    Hyperlipidemia Father    Heart disease Father        before age 28   Hypertension Father    Cancer Mother        uterine   Hyperlipidemia Mother    Diabetes  Mother    Hypertension Mother    Hypertension Brother      Review of Systems  All other systems reviewed and are negative.      Objective:   Physical Exam Vitals reviewed.  Constitutional:      General: He is not in acute distress.    Appearance: He is well-developed. He is not diaphoretic.  HENT:     Head: Normocephalic and atraumatic.     Right Ear: External ear normal.     Left Ear: External ear normal.     Nose: Nose normal. No congestion.     Mouth/Throat:     Pharynx: No oropharyngeal exudate.  Neck:     Thyroid: No thyromegaly.     Vascular: No JVD.     Trachea: No tracheal deviation.  Cardiovascular:     Rate and Rhythm: Normal rate and regular rhythm.     Heart sounds: Normal heart sounds. No murmur heard.    No friction rub. No gallop.  Pulmonary:     Effort: Pulmonary effort is normal. No respiratory distress.     Breath sounds: No stridor. Wheezing and rhonchi present. No rales.  Chest:     Chest wall: No tenderness.  Musculoskeletal:        General: No tenderness or deformity.     Right shoulder: No tenderness or bony tenderness. Decreased range of motion.     Cervical back: Neck supple. No bony tenderness. No pain with movement. Decreased range of motion.       Back:  Lymphadenopathy:     Cervical: No cervical adenopathy.  Skin:    General: Skin is warm.  Coloration: Skin is not pale.     Findings: No erythema or rash.  Neurological:     Mental Status: He is alert and oriented to person, place, and time.     Cranial Nerves: No cranial nerve deficit.     Motor: No abnormal muscle tone.     Coordination: Coordination normal.     Deep Tendon Reflexes: Reflexes are normal and symmetric.  Psychiatric:        Behavior: Behavior normal.        Thought Content: Thought content normal.        Judgment: Judgment normal.           Assessment & Plan:  Cervical radiculopathy - Plan: MR Cervical Spine Wo Contrast  Foraminal stenosis of cervical  region - Plan: MR Cervical Spine Wo Contrast  Right arm weakness - Plan: MR Cervical Spine Wo Contrast Patient has a clinical presentation that it is somewhat confusing.  He had numerous right shoulder injections without benefit.  He tried physical therapy.  He has seen 2 different orthopedist 1 of which thought he had cervical radiculopathy.  He has foraminal stenosis of the cervical spine seen on x-rays and he has cervical radicular pain pattern with weakness and numbness in his right hand.  Therefore I believe the majority of his pain likely is cervical radiculopathy.  For this reason and for the fact that he has tried conservative therapy including physical therapy for now more than a year I have recommended an MRI of the cervical spine to evaluate further

## 2022-04-10 ENCOUNTER — Other Ambulatory Visit: Payer: Self-pay

## 2022-04-10 MED ORDER — LOSARTAN POTASSIUM 100 MG PO TABS: 100.0000 mg | ORAL_TABLET | Freq: Every day | ORAL | 0 refills | Status: DC

## 2022-04-17 ENCOUNTER — Ambulatory Visit: Payer: Self-pay | Admitting: *Deleted

## 2022-04-17 NOTE — Patient Outreach (Signed)
  Care Coordination   Follow Up Visit Note   04/17/2022 Name: [name] MRN: [id number] DOB: July 19, 1960  [name] is a 61 y.o. year old male who sees Pickard, Cammie Mcgee, MD for primary care. I spoke with  [name] by phone today.  What matters to the patients health and wellness today?  MRI  - went to see his pcp on 03/31/22 to discuss his concerns and to get an order for MRI   Caregiver- He is the complete primary caregiver for [name] as now her daughter is limited to assist [name] is disabled  [name] is having elevated blood pressures & confusion. It is tasking for [name] to get her to medical appointments He confirmed interest in alternative care for her   Goals Addressed               This Visit's Progress     Patient Stated     improve kidney functioning labs [name]) (pt-stated)   On track     Care Coordination Interventions: Reviewed scheduled/upcoming provider appointments including    Assessed for any improvements in renal functioning. Confirmed a pcp visit and  further diagnostic testing has ordered        manage COPD/upper respiratory symptoms (THN) (pt-stated)   On track     Care Coordination Interventions: Discussed the importance of adequate rest and management of fatigue with COPD Assessed social determinant of health barriers  Assessed for any respiratory improvements since last outreach- improvements  Encouragement provided       Manage right  shoulder pain (THN) (pt-stated)   On track     Care Coordination Interventions:  Confirmed patient had a pcp visit and now has a referral to have a MRI for his right shoulder. Encouraged patient         SDOH assessments and interventions completed:  No     Care Coordination Interventions:  Yes, provided   Follow up plan: Follow up call scheduled for 05/08/22    Encounter Outcome:  Pt. Visit Completed   Furman Trentman L. Lavina Hamman, RN, BSN, [place] Coordinator Office number [phone]

## 2022-04-17 NOTE — Patient Instructions (Addendum)
Visit Information  Thank you for taking time to visit with me today. Please don't hesitate to contact me if I can be of assistance to you.   Following are the goals we discussed today:    [name] I called & Sent an e-mail to [email or web address]  [facility] [address], [place] [phone] Left a message for them to call you  Sent online information to [facility], [facility], [facility] visit You may get calls from any of these companies    Goals Addressed               This Visit's Progress     Patient Stated     improve kidney functioning labs [facility]) (pt-stated)   On track     Care Coordination Interventions: Reviewed scheduled/upcoming provider appointments including    Assessed for any improvements in renal functioning. Confirmed a pcp visit and  further diagnostic testing has ordered        manage COPD/upper respiratory symptoms (THN) (pt-stated)   On track     Care Coordination Interventions: Discussed the importance of adequate rest and management of fatigue with COPD Assessed social determinant of health barriers  Assessed for any respiratory improvements since last outreach- improvements  Encouragement provided       Manage right  shoulder pain (THN) (pt-stated)   On track     Care Coordination Interventions:  Confirmed patient had a pcp visit and now has a referral to have a MRI for his right shoulder. Encouraged patient         Our next appointment is by telephone on 05/08/22 at 1030  Please call the care guide team at [phone] if you need to cancel or reschedule your appointment.   If you are experiencing a Mental Health or [place] or need someone to talk to, please call the Suicide and Crisis Lifeline: 988 call the [place] National Suicide Prevention Lifeline: 774-757-7026 or TTY: 7758226572 TTY 313-382-5145) to talk to a trained counselor call  1-800-273-TALK (toll free, 24 hour hotline) go to [facility] [address], [place] [phone]) call 911   Patient verbalizes understanding of instructions and care plan provided today and agrees to view in [place]. Active MyChart status and patient understanding of how to access instructions and care plan via MyChart confirmed with patient.     The patient has been provided with contact information for the care management team and has been advised to call with any health related questions or concerns.    Verdon Ferrante L. Lavina Hamman, RN, BSN, [place] Coordinator Office number [phone]

## 2022-04-18 ENCOUNTER — Ambulatory Visit (HOSPITAL_COMMUNITY)
Admission: RE | Admit: 2022-04-18 | Discharge: 2022-04-18 | Disposition: A | Discharge status: Home / Self Care | Payer: Medicare HMO | Source: Ambulatory Visit | Attending: Family Medicine | Admitting: Family Medicine

## 2022-04-18 DIAGNOSIS — M4802 Spinal stenosis, cervical region: Secondary | ICD-10-CM | POA: Diagnosis not present

## 2022-04-18 DIAGNOSIS — M47812 Spondylosis without myelopathy or radiculopathy, cervical region: Secondary | ICD-10-CM | POA: Diagnosis not present

## 2022-04-18 DIAGNOSIS — M5412 Radiculopathy, cervical region: Secondary | ICD-10-CM | POA: Insufficient documentation

## 2022-04-18 DIAGNOSIS — R29898 Other symptoms and signs involving the musculoskeletal system: Secondary | ICD-10-CM | POA: Insufficient documentation

## 2022-04-22 ENCOUNTER — Other Ambulatory Visit: Payer: Self-pay

## 2022-04-22 DIAGNOSIS — M5412 Radiculopathy, cervical region: Secondary | ICD-10-CM

## 2022-04-30 ENCOUNTER — Encounter: Payer: Self-pay | Admitting: Physical Medicine and Rehabilitation

## 2022-04-30 ENCOUNTER — Ambulatory Visit: Payer: Medicare HMO | Admitting: Physical Medicine and Rehabilitation

## 2022-04-30 DIAGNOSIS — M7918 Myalgia, other site: Secondary | ICD-10-CM

## 2022-04-30 DIAGNOSIS — M5412 Radiculopathy, cervical region: Secondary | ICD-10-CM

## 2022-04-30 DIAGNOSIS — G894 Chronic pain syndrome: Secondary | ICD-10-CM | POA: Diagnosis not present

## 2022-04-30 DIAGNOSIS — M898X1 Other specified disorders of bone, shoulder: Secondary | ICD-10-CM | POA: Diagnosis not present

## 2022-04-30 DIAGNOSIS — G8929 Other chronic pain: Secondary | ICD-10-CM

## 2022-04-30 NOTE — Progress Notes (Signed)
Functional Pain Scale - descriptive words and definitions   Severe (9)  Cannot do any ADL's even with assistance can barely talk/unable to sleep and unable to use distraction. Severe range order  Average Pain 9     Neck pain on right side that radiates into the right shoulder and down the right arm. Having weakness in right hand

## 2022-04-30 NOTE — Progress Notes (Signed)
[name] - 62 y.o. male MRN [id number]  Date of birth: Aug 29, 1960  Office Visit Note: Visit Date: 04/30/2022 PCP: Susy Frizzle, MD Referred by: Susy Frizzle, MD  Subjective: Chief Complaint  Patient presents with   Neck - Pain   HPI: [name] is a 62 y.o. male who comes in today for evaluation of chronic, worsening and severe right sided scapular pain radiating down right arm to hand. Pain ongoing for several months and is exacerbated by movement and activity. He describes pain as sore and aching, currently rates as 8 out of 10. Some relief of pain with home exercise regimen, rest and use of medications. History of formal physical therapy and dry needling at [facility], he reports no relief of pain with these treatments. He is currently being treated for chronic pain by Dr. Red Christians at [facility], he is currently taking Gabapentin 800 mg TID and 10-325 mg Percocet every 6 hours. He reports minimal relief of pain with medications. Recent cervical MRI imaging exhibits multilevel cervical spondylosis with resultant mild spinal stenosis at C4-C5 and C6-C7. There is mild bilateral foraminal narrowing at C4, moderate on the left and mild on the right at C5 and moderate bilaterally at C7. No high grade spinal canal stenosis. History of multiple right C7-T1 interlaminar epidural steroid injections at [facility], he reports some relief of pain for 1 week with these procedures. Patient did undergo right scapular trigger point injections both in our office and with Dr. Eduard Roux, no relief of pain with these injections. History of surgical consult with Dr. Consuella Lose in March of 2023 at [facility], per his notes did not recommend any surgical intervention at this time. Patient underwent InterStim placement in 2013 with Dr. Alona Bene due to issues with nocturia and fecal incontinence, however this device has since been  removed as he did not feel it was beneficial in alleviating his symptoms.   Patients course is complicated by coronary artery disease, MI, peripheral vascular disease, and COPD. He is currently on long term anticoagulant therapy with Plavix.      Review of Systems  Musculoskeletal:  Positive for back pain.  Neurological:  Positive for tingling. Negative for focal weakness and weakness.  All other systems reviewed and are negative.  Otherwise per HPI.  Assessment & Plan: Visit Diagnoses:    ICD-10-CM   1. Chronic scapular pain  M89.8X1    G89.29     2. Radiculopathy, cervical region  M54.12     3. Myofascial pain  M79.18     4. Chronic pain syndrome  G89.4        Plan: Findings:  Chronic, worsening and severe right scapular pain radiating down right arm to hand.  Patient continues to have severe pain despite good conservative therapy such as formal physical therapy/dry needling, home exercise regimen, rest and use of medications.  Patient's clinical presentation and exam are complex, his symptoms do fit more of a C5/C6 distribution, however he does have a palpable trigger point noted to right rhomboid region. I also feel there could be a type of central sensitization syndrome such as fibromyalgia contributing to his symptoms. History of short lived relief of pain with previous cervical epidural steroid injections at [place]. Next step is to perform diagnostic and hopefully therapeutic right C7-T1 interlaminar epidural steroid injection under fluoroscopic guidance.  He is currently taking Plavix, we will contact prescribing physician for permission  for him to come off of this medication for injection.  At this point, I would not repeat myofascial trigger point injections as this procedure did not seem to be beneficial in alleviating patient's pain.  I did speak with him about regrouping with our in-house physical therapy team for reevaluation and possible dry needling, patient is not  interested in pursuing these treatments at this time.  If good relief of pain with cervical epidural steroid injection we can repeat this procedure infrequently as needed.  If his pain persists we did discuss medication management, could look at trial of Cymbalta. He is unsure if he has tried this medication in the past. We also discussed possibility obtaining thoracic MRI imaging.  Ultimately we feel it would be best for patient to follow-up with chronic pain management team if interventional spine procedures fail to provide lasting relief of pain.  No red flag symptoms noted upon exam today.    Meds & Orders: No orders of the defined types were placed in this encounter.  No orders of the defined types were placed in this encounter.   Follow-up: Return for Right C7-T1 interlaminar epidural steroid injection.   Procedures: No procedures performed      Clinical History: EXAM: MRI CERVICAL SPINE WITHOUT CONTRAST   TECHNIQUE: Multiplanar, multisequence MR imaging of the cervical spine was performed. No intravenous contrast was administered.   COMPARISON:  Prior CT from 06/28/2021.   FINDINGS: Alignment: Straightening of the normal cervical lordosis. Trace degenerative retrolisthesis of C4 on C5.   Vertebrae: Vertebral body height maintained without acute or chronic fracture. Bone marrow signal intensity within normal limits. No discrete or worrisome osseous lesions. Minor discogenic reactive endplate changes present about the C6-7 interspace. No other abnormal marrow edema.   Cord: Normal signal and morphology.   Posterior Fossa, vertebral arteries, paraspinal tissues: Patchy signal abnormality noted involving the pons, most characteristic of chronic microvascular ischemic disease. Craniocervical junction normal. Paraspinous soft tissues within normal limits. Normal flow voids seen within the vertebral arteries bilaterally.   Disc levels:   C2-C3: Unremarkable.   C3-C4: Mild  disc bulge with bilateral uncovertebral spurring. Flattening of the ventral thecal sac without significant spinal stenosis. Mild bilateral C4 foraminal stenosis.   C4-C5: Disc bulge with bilateral uncovertebral spurring, greater on the left. Flattening and partial effacement of the ventral thecal sac with resultant mild spinal stenosis. No frank cord impingement. Moderate left with mild right C5 foraminal narrowing.   C5-C6: Disc bulge with mild uncovertebral spurring. Mild left-sided facet hypertrophy. No spinal stenosis. Mild left C6 foraminal narrowing. Right neural foramina is patent.   C6-C7: Mild degenerative intervertebral disc space narrowing with diffuse disc osteophyte complex. Flattening and partial effacement of the ventral thecal sac with resultant mild spinal stenosis. Moderate bilateral C7 foraminal narrowing.   C7-T1: Minimal annular disc bulge. Left-sided facet hypertrophy. No spinal stenosis. Foramina remain patent.   IMPRESSION: 1. Multilevel cervical spondylosis with resultant mild spinal stenosis at C4-5 and C6-7. 2. Multifactorial degenerative changes with resultant multilevel foraminal narrowing as above. Notable findings include mild bilateral C4 foraminal stenosis, moderate left with mild right C5 foraminal narrowing, with moderate bilateral C7 foraminal stenosis.     Electronically Signed   By: Jeannine Boga M.D.   On: 04/21/2022 02:13   He reports that he quit smoking about 13 years ago. His smoking use included cigarettes and cigars. He has a 90.00 pack-year smoking history. He has never used smokeless tobacco. No results for input(s): "HGBA1C", "  LABURIC" in the last 8760 hours.  Objective:  VS:  HT:    WT:   BMI:     BP:   HR: bpm  TEMP: ( )  RESP:  Physical Exam Vitals and nursing note reviewed.  HENT:     Head: Normocephalic and atraumatic.     Right Ear: External ear normal.     Left Ear: External ear normal.     Nose: Nose  normal.     Mouth/Throat:     Mouth: Mucous membranes are moist.  Eyes:     Extraocular Movements: Extraocular movements intact.  Cardiovascular:     Rate and Rhythm: Normal rate.     Pulses: Normal pulses.  Pulmonary:     Effort: Pulmonary effort is normal.  Abdominal:     General: Abdomen is flat. There is no distension.  Musculoskeletal:        General: Tenderness present.     Cervical back: Tenderness present.     Comments: No discomfort noted with flexion, extension and side-to-side rotation. Patient has good strength in the upper extremities including 5 out of 5 strength in wrist extension, long finger flexion and APB.  There is no atrophy of the hands intrinsically.  Sensation intact bilaterally. Negative Hoffman's sign. Palpable trigger point noted to right rhomboid region. Good range of motion noted to right shoulder.   Skin:    General: Skin is warm and dry.     Capillary Refill: Capillary refill takes less than 2 seconds.  Neurological:     General: No focal deficit present.     Mental Status: He is alert and oriented to person, place, and time.  Psychiatric:        Mood and Affect: Mood normal.        Behavior: Behavior normal.     Ortho Exam  Imaging: No results found.  Past Medical/Family/Surgical/Social History: Medications & Allergies reviewed per EMR, new medications updated. 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   Mixed hyperlipidemia 02/23/2017   Peripheral vascular disease ([place]) 06/01/2016   Nasolacrimal duct obstruction, acquired, right 03/11/2016   Medication side effects 06/28/2014   Chronic obstructive pulmonary disease ([place])  05/24/2013   Coronary artery disease 06/14/2012   Chronic, continuous use of opioids 06/14/2012   Bowel incontinence 87/56/4332   Umbilical hernia 95/18/8416   Increased frequency of urination 12/11/2011   Prostatitis 11/13/2011   Atherosclerosis of native arteries of the extremities with intermittent claudication 10/03/2011   Atherosclerosis of native artery of extremity with intermittent claudication ([place]) 10/03/2011   Fecal smearing 09/26/2011   Acquired hindfoot varus 08/12/2011   Instability of ankle joint 08/12/2011   Painful sternal wire 06/30/2011   Encounter for therapeutic drug monitoring 04/06/2011   Sural neuritis 04/06/2011   Ankle pain 01/31/2011   Peripheral neuropathy 01/31/2011   Chronic right shoulder pain 01/30/2011   Leg cramps 11/12/2010   Dyspnea    Orthostatic hypotension    History of syncope    Past Medical History:  Diagnosis Date   Acute MI, inferior wall ([place]) 04/2009   Chest pain    COPD (chronic obstructive pulmonary disease) ([place])    Coronary artery disease    STATUS  POST CABG 01/2009   Dyslipidemia    Hyperlipidemia    Hypertension    Orthostatic hypotension    Peripheral vascular disease ([facility])    Syncope and collapse    Family History  Problem Relation Age of Onset   Coronary artery disease Father 13   Diabetes Father    Hyperlipidemia Father    Heart disease Father        before age 22   Hypertension Father    Cancer Mother        uterine   Hyperlipidemia Mother    Diabetes Mother    Hypertension Mother    Hypertension Brother    Past Surgical History:  Procedure Laterality Date   ANKLE SURGERY     x 2   COLONOSCOPY  April 2013   CORONARY ANGIOPLASTY     CORONARY ARTERY BYPASS GRAFT  2010   FOOT SURGERY     HERNIA REPAIR  01/23/12   umb hernia   Kidney stimulator     LEFT HEART CATH AND CORS/GRAFTS ANGIOGRAPHY N/A 06/02/2016   Procedure: Left Heart Cath and Cors/Grafts Angiography;  Surgeon: Lorretta Harp, MD;  Location:  [facility];  Service: Cardiovascular;  Laterality: N/A;   LEFT HEART CATHETERIZATION WITH CORONARY/GRAFT ANGIOGRAM N/A 07/08/2012   Procedure: LEFT HEART CATHETERIZATION WITH Beatrix Fetters;  Surgeon: Thayer Headings, MD;  Location: [facility];  Service: Cardiovascular;  Laterality: N/A;   PR VEIN BYPASS GRAFT,AORTO-FEM-POP     STERNAL WIRES REMOVAL  07/08/2011   Procedure: STERNAL WIRES REMOVAL;  Surgeon: Grace Isaac, MD;  Location: [facility];  Service: Open Heart Surgery;  Laterality: N/A;  removal of 4th wire   Social History   Occupational History   Occupation: Disability  Tobacco Use   Smoking status: Former    Packs/day: 3.00    Years: 30.00    Total pack years: 90.00    Types: Cigarettes, Cigars    Quit date: 01/26/2009    Years since quitting: 13.2   Smokeless tobacco: Never  Vaping Use   Vaping Use: Never used  Substance and Sexual Activity   Alcohol use: No   Drug use: No   Sexual activity: Not Currently

## 2022-05-07 ENCOUNTER — Other Ambulatory Visit (HOSPITAL_COMMUNITY): Payer: Self-pay

## 2022-05-07 NOTE — Progress Notes (Unsigned)
Subjective:   [name] is a 62 y.o. male who presents for Medicare Annual/Subsequent preventive examination.  I connected with  [name] on 05/08/22 by a audio enabled telemedicine application and verified that I am speaking with the correct person using two identifiers.  Patient Location: Home  Provider Location: Office/Clinic  I discussed the limitations of evaluation and management by telemedicine. The patient expressed understanding and agreed to proceed.  Review of Systems     Cardiac Risk Factors include: advanced age (>70mn, >>31women);male gender;hypertension     Objective:    Today's Vitals   05/08/22 0847  Weight: 260 lb (117.9 kg)  Height: '5\' 9"'$  (1.753 m)   Body mass index is 38.4 kg/m.     05/08/2022    9:06 AM 07/30/2021    7:53 AM 05/30/2021    9:28 AM 05/02/2021    9:13 AM 04/15/2021    9:26 AM 12/12/2019    1:18 PM 07/24/2019   10:12 AM  Advanced Directives  Does Patient Have a Medical Advance Directive? No No No No No No No  Would patient like information on creating a medical advance directive? No - Patient declined No - Patient declined No - Patient declined No - Patient declined No - Patient declined      Current Medications (verified) Outpatient Encounter Medications as of 05/08/2022  Medication Sig   albuterol (VENTOLIN HFA) 108 (90 Base) MCG/ACT inhaler INHALE 2 PUFFS INTO THE LUNGS EVERY 4 HOURS AS NEEDED FOR WHEEZING OR SHORTNESS OF BREATH   amLODipine (NORVASC) 10 MG tablet TAKE 1 TABLET(10 MG) BY MOUTH DAILY   aspirin EC 81 MG tablet Take 1 tablet (81 mg total) by mouth every morning.   atorvastatin (LIPITOR) 80 MG tablet Take 1 tablet (80 mg total) by mouth at bedtime. PT NEEDS OV FOR FURTHER REFILLS, NOT SEEN SINCE 2021   cloNIDine (CATAPRES) 0.1 MG tablet    clopidogrel (PLAVIX) 75 MG tablet TAKE 1 TABLET(75 MG) BY MOUTH DAILY   cyclobenzaprine (FLEXERIL) 10 MG tablet Take 1 tablet (10 mg total) by mouth 2 (two) times daily as  needed for muscle spasms.   Evolocumab (REPATHA SURECLICK) 1951MG/ML SOAJ Inject 140 mg into the skin every 14 (fourteen) days.   folic acid (FOLVITE) 1 MG tablet Take by mouth.   furosemide (LASIX) 20 MG tablet TAKE 1 TABLET BY MOUTH 3 TIMES A WEEK AS NEEDED   gabapentin (NEURONTIN) 800 MG tablet Take 800 mg by mouth 3 (three) times daily.   hydrochlorothiazide (HYDRODIURIL) 25 MG tablet Take 1 tablet (25 mg total) by mouth daily. Pt past due for 6 month follow up; please call for appt   hydroxyurea (HYDREA) 500 MG capsule Take by mouth.   ibuprofen (ADVIL) 800 MG tablet Take 1 tablet (800 mg total) by mouth 3 (three) times daily. Take with food   losartan (COZAAR) 100 MG tablet Take 1 tablet (100 mg total) by mouth daily.   methocarbamol (ROBAXIN) 500 MG tablet Take 1 tablet (500 mg total) by mouth 2 (two) times daily.   omeprazole (PRILOSEC) 40 MG capsule Take 1 capsule by mouth daily.   oxyCODONE-acetaminophen (PERCOCET) 10-325 MG tablet Take 1 tablet by mouth every 6 (six) hours as needed.   No facility-administered encounter medications on file as of 05/08/2022.    Allergies (verified) Rosuvastatin   History: Past Medical History:  Diagnosis Date   Acute MI, inferior wall ([place] 04/2009   Chest pain  COPD (chronic obstructive pulmonary disease) (HCC)    Coronary artery disease    STATUS POST CABG 01/2009   Dyslipidemia    Hyperlipidemia    Hypertension    Orthostatic hypotension    Peripheral vascular disease (HCC)    Syncope and collapse    Past Surgical History:  Procedure Laterality Date   ANKLE SURGERY     x 2   COLONOSCOPY  April 2013   CORONARY ANGIOPLASTY     CORONARY ARTERY BYPASS GRAFT  2010   FOOT SURGERY     HERNIA REPAIR  01/23/12   umb hernia   Kidney stimulator     LEFT HEART CATH AND CORS/GRAFTS ANGIOGRAPHY N/A 06/02/2016   Procedure: Left Heart Cath and Cors/Grafts Angiography;  Surgeon: Lorretta Harp, MD;  Location: [facility];  Service:  Cardiovascular;  Laterality: N/A;   LEFT HEART CATHETERIZATION WITH CORONARY/GRAFT ANGIOGRAM N/A 07/08/2012   Procedure: LEFT HEART CATHETERIZATION WITH Beatrix Fetters;  Surgeon: Thayer Headings, MD;  Location: [facility];  Service: Cardiovascular;  Laterality: N/A;   PR VEIN BYPASS GRAFT,AORTO-FEM-POP     STERNAL WIRES REMOVAL  07/08/2011   Procedure: STERNAL WIRES REMOVAL;  Surgeon: Grace Isaac, MD;  Location: [facility];  Service: Open Heart Surgery;  Laterality: N/A;  removal of 4th wire   Family History  Problem Relation Age of Onset   Coronary artery disease Father 64   Diabetes Father    Hyperlipidemia Father    Heart disease Father        before age 14   Hypertension Father    Cancer Mother        uterine   Hyperlipidemia Mother    Diabetes Mother    Hypertension Mother    Hypertension Brother    Social History   Socioeconomic History   Marital status: Married    Spouse name: [name]   Number of children: 2   Years of education: Not on file   Highest education level: Not on file  Occupational History   Occupation: Disability  Tobacco Use   Smoking status: Former    Packs/day: 3.00    Years: 30.00    Total pack years: 90.00    Types: Cigarettes, Cigars    Quit date: 01/26/2009    Years since quitting: 13.2   Smokeless tobacco: Never  Vaping Use   Vaping Use: Never used  Substance and Sexual Activity   Alcohol use: No   Drug use: No   Sexual activity: Not Currently  Other Topics Concern   Not on file  Social History Narrative   Married x 37 years. [name])    Social Determinants of Health   Financial Resource Strain: Low Risk  (05/08/2022)   Overall Financial Resource Strain (CARDIA)    Difficulty of Paying Living Expenses: Not hard at all  Food Insecurity: No Food Insecurity (05/08/2022)   Hunger Vital Sign    Worried About Running Out of Food in the Last Year: Never true    Ran Out of Food in the Last Year: Never true  Transportation Needs: No  Transportation Needs (05/08/2022)   PRAPARE - Hydrologist (Medical): No    Lack of Transportation (Non-Medical): No  Physical Activity: Sufficiently Active (05/08/2022)   Exercise Vital Sign    Days of Exercise per Week: 5 days    Minutes of Exercise per Session: 30 min  Stress: No Stress Concern Present (05/08/2022)   [employer]  of Occupational [employer] - Occupational Stress Questionnaire    Feeling of Stress : Not at all  Social Connections: Moderately Isolated (05/08/2022)   Social Connection and Isolation Panel [NHANES]    Frequency of Communication with Friends and Family: More than three times a week    Frequency of Social Gatherings with Friends and Family: Three times a week    Attends Religious Services: Never    Active Member of Clubs or Organizations: No    Attends Music therapist: Never    Marital Status: Married    Tobacco Counseling Counseling given: Not Answered   Clinical Intake:  Pre-visit preparation completed: Yes  Pain : No/denies pain  Diabetes: No  How often do you need to have someone help you when you read instructions, pamphlets, or other written materials from your doctor or pharmacy?: 1 - Never  Diabetic?No   Interpreter Needed?: No  Information entered by :: Denman George LPN   Activities of Daily Living    05/08/2022    9:06 AM  In your present state of health, do you have any difficulty performing the following activities:  Hearing? 0  Vision? 0  Difficulty concentrating or making decisions? 0  Walking or climbing stairs? 0  Dressing or bathing? 0  Doing errands, shopping? 0  Preparing Food and eating ? N  Using the Toilet? N  In the past six months, have you accidently leaked urine? N  Do you have problems with loss of bowel control? N  Managing your Medications? N  Managing your Finances? N  Housekeeping or managing your Housekeeping? N    Patient Care Team: Susy Frizzle, MD  as PCP - General (Family Medicine) Nahser, Wonda Cheng, MD as PCP - Cardiology (Cardiology) Barbaraann Faster, RN as [place] Management Lorine Bears, NP as Nurse Practitioner (Physical Medicine and Rehabilitation) Domingo Pulse, MD (Urology)  Indicate any recent Medical Services you may have received from other than [facility] providers in the past year (date may be approximate).     Assessment:   This is a routine wellness examination for [place].  Hearing/Vision screen Hearing Screening - Comments:: Denies hearing difficulties  Vision Screening - Comments::  up to date with routine eye exams  Dietary issues and exercise activities discussed: Current Exercise Habits: Home exercise routine, Type of exercise: walking, Time (Minutes): 30, Frequency (Times/Week): 5, Weekly Exercise (Minutes/Week): 150, Intensity: Mild   Goals Addressed   None    Depression Screen    05/08/2022    9:05 AM 03/31/2022   10:54 AM 12/18/2021    9:18 PM 05/02/2021    9:09 AM 06/06/2019    8:53 AM  PHQ 2/9 Scores  PHQ - 2 Score 0 0 0 0 0    Fall Risk    03/31/2022   10:54 AM 05/02/2021    9:13 AM  [place] in the past year? 0 0  Number falls in past yr: 0 0  Injury with Fall? 0 0  Risk for fall due to : No Fall Risks No Fall Risks  Follow up Falls prevention discussed Falls prevention discussed    FALL RISK PREVENTION PERTAINING TO THE HOME:  Any stairs in or around the home? Yes  If so, are there any without handrails? No  Home free of loose throw rugs in walkways, pet beds, electrical cords, etc? Yes  Adequate lighting in your home to reduce risk of falls? Yes   ASSISTIVE  DEVICES UTILIZED TO PREVENT FALLS:  Life alert? No  Use of a cane, walker or w/c? No  Grab bars in the bathroom? Yes  Shower chair or bench in shower? No  Elevated toilet seat or a handicapped toilet? Yes   TIMED UP AND GO:  Was the test performed? No . Telephonic visit   Cognitive  Function:        05/08/2022    9:06 AM 05/02/2021    9:17 AM  6CIT Screen  What Year? 0 points 0 points  What month? 0 points 0 points  What time? 0 points 0 points  Count back from 20 0 points 0 points  Months in reverse 0 points 4 points  Repeat phrase 0 points 0 points  Total Score 0 points 4 points    Immunizations Immunization History  Administered Date(s) Administered   Influenza Split 12/30/2010   Influenza,inj,Quad PF,6+ Mos 01/13/2013, 05/23/2013, 01/12/2014, 04/27/2015, 01/08/2016, 06/02/2016, 01/06/2017, 01/26/2018, 01/26/2019, 02/15/2020, 02/07/2022   PFIZER(Purple Top)SARS-COV-2 Vaccination 07/28/2019, 08/20/2019   Pfizer Covid-19 Vaccine Bivalent Booster 50yr & up 04/11/2020, 02/20/2021   Pneumococcal Conjugate-13 11/07/2016   Pneumococcal Polysaccharide-23 05/23/2013, 06/06/2019   Td 10/04/2010   Tdap 10/04/2010, 12/12/2019    TDAP status: Up to date  Flu Vaccine status: Up to date  Pneumococcal vaccine status: Up to date  Covid-19 vaccine status: Information provided on how to obtain vaccines.   Qualifies for Shingles Vaccine? Yes   Zostavax completed No   Shingrix Completed?: No.    Education has been provided regarding the importance of this vaccine. Patient has been advised to call insurance company to determine out of pocket expense if they have not yet received this vaccine. Advised may also receive vaccine at local pharmacy or Health Dept. Verbalized acceptance and understanding.  Screening Tests Health Maintenance  Topic Date Due   COVID-19 Vaccine (5 - 2023-24 season) 12/27/2021   Zoster Vaccines- Shingrix (1 of 2) 06/30/2022 (Originally 08/01/1979)   Lung Cancer Screening  04/01/2023 (Originally 05/22/2014)   Medicare Annual Wellness (AWV)  05/09/2023   COLONOSCOPY (Pts 45-486yrInsurance coverage will need to be confirmed)  11/08/2026   DTaP/Tdap/Td (4 - Td or Tdap) 12/11/2029   INFLUENZA VACCINE  Completed   Hepatitis C Screening  Completed    HIV Screening  Completed   HPV VACCINES  Aged Out    Health Maintenance  Health Maintenance Due  Topic Date Due   COVID-19 Vaccine (5 - 2023-24 season) 12/27/2021    Colorectal cancer screening: Type of screening: Colonoscopy. Completed 11/07/16. Repeat every 10 years  Lung Cancer Screening: (Low Dose CT Chest recommended if Age 450-80ears, 30 pack-year currently smoking OR have quit w/in 15years.) does qualify.   Lung Cancer Screening Referral: will discuss with provider at next appointment   Additional Screening:  Hepatitis C Screening: does qualify; Completed 11/07/16  Vision Screening: Recommended annual ophthalmology exams for early detection of glaucoma and other disorders of the eye. Is the patient up to date with their annual eye exam?  Yes  Who is the provider or what is the name of the office in which the patient attends annual eye exams? Unable to provide name  If pt is not established with a provider, would they like to be referred to a provider to establish care? No .   Dental Screening: Recommended annual dental exams for proper oral hygiene  Community Resource Referral / Chronic Care Management: CRR required this visit?  No   CCM required this visit?  Currently receiving services      Plan:     I have personally reviewed and noted the following in the patient's chart:   Medical and social history Use of alcohol, tobacco or illicit drugs  Current medications and supplements including opioid prescriptions. Patient is currently taking opioid prescriptions. Information provided to patient regarding non-opioid alternatives. Patient advised to discuss non-opioid treatment plan with their provider. Functional ability and status Nutritional status Physical activity Advanced directives List of other physicians Hospitalizations, surgeries, and ER visits in previous 12 months Vitals Screenings to include cognitive, depression, and falls Referrals and  appointments  In addition, I have reviewed and discussed with patient certain preventive protocols, quality metrics, and best practice recommendations. A written personalized care plan for preventive services as well as general preventive health recommendations were provided to patient.     Vanetta Mulders, [place]   9/83/3825   Due to this being a virtual visit, the after visit summary with patients personalized plan was offered to patient via mail or my-chart. Patient would like to access on my-chart  Nurse Notes: Patient would like for [place] to check to see if he will have a copay to receive Shingrix in the office.

## 2022-05-07 NOTE — Patient Instructions (Signed)
[name] , Thank you for taking time to come for your Medicare Wellness Visit. I appreciate your ongoing commitment to your health goals. Please review the following plan we discussed and let me know if I can assist you in the future.   These are the goals we discussed:  Goals       Exercise 3x per week (30 min per time)      Pt states he would like to exercise more.       improve kidney functioning labs [facility]) (pt-stated)      Care Coordination Interventions: Reviewed scheduled/upcoming provider appointments including    Assessed for any improvements in renal functioning. Confirmed a pcp visit and  further diagnostic testing has ordered        manage COPD/upper respiratory symptoms ([facility]) (pt-stated)      Care Coordination Interventions: Discussed the importance of adequate rest and management of fatigue with COPD Assessed social determinant of health barriers  Assessed for any respiratory improvements since last outreach- improvements  Encouragement provided       Manage right  shoulder pain ([facility]) (pt-stated)      Care Coordination Interventions:  Confirmed patient had a pcp visit and now has a referral to have a MRI for his right shoulder. Encouraged patient         This is a list of the screening recommended for you and due dates:  Health Maintenance  Topic Date Due   COVID-19 Vaccine (5 - 2023-24 season) 12/27/2021   Zoster (Shingles) Vaccine (1 of 2) 06/30/2022*   Screening for Lung Cancer  04/01/2023*   Medicare Annual Wellness Visit  05/09/2023   Colon Cancer Screening  11/08/2026   DTaP/Tdap/Td vaccine (4 - Td or Tdap) 12/11/2029   Flu Shot  Completed   Hepatitis C Screening: USPSTF Recommendation to screen - Ages 18-79 yo.  Completed   HIV Screening  Completed   HPV Vaccine  Aged Out  *Topic was postponed. The date shown is not the original due date.    Advanced directives: Forms are available if you choose in the future to pursue completion.  This is  recommended in order to make sure that your health wishes are honored in the event that you are unable to verbalize them to the provider.    Conditions/risks identified: Aim for 30 minutes of exercise or brisk walking, 6-8 glasses of water, and 5 servings of fruits and vegetables each day.   Next appointment: Follow up in one year for your annual wellness visit   Preventive Care 40-64 Years, Male Preventive care refers to lifestyle choices and visits with your health care provider that can promote health and wellness. What does preventive care include? A yearly physical exam. This is also called an annual well check. Dental exams once or twice a year. Routine eye exams. Ask your health care provider how often you should have your eyes checked. Personal lifestyle choices, including: Daily care of your teeth and gums. Regular physical activity. Eating a healthy diet. Avoiding tobacco and drug use. Limiting alcohol use. Practicing safe sex. Taking low-dose aspirin every day starting at age 47. What happens during an annual well check? The services and screenings done by your health care provider during your annual well check will depend on your age, overall health, lifestyle risk factors, and family history of disease. Counseling  Your health care provider may ask you questions about your: Alcohol use. Tobacco use. Drug use. Emotional well-being. Home and relationship well-being. Sexual  activity. Eating habits. Work and work Statistician. Screening  You may have the following tests or measurements: Height, weight, and BMI. Blood pressure. Lipid and cholesterol levels. These may be checked every 5 years, or more frequently if you are over 51 years old. Skin check. Lung cancer screening. You may have this screening every year starting at age 61 if you have a 30-pack-year history of smoking and currently smoke or have quit within the past 15 years. Fecal occult blood test (FOBT) of  the stool. You may have this test every year starting at age 56. Flexible sigmoidoscopy or colonoscopy. You may have a sigmoidoscopy every 5 years or a colonoscopy every 10 years starting at age 45. Prostate cancer screening. Recommendations will vary depending on your family history and other risks. Hepatitis C blood test. Hepatitis B blood test. Sexually transmitted disease (STD) testing. Diabetes screening. This is done by checking your blood sugar (glucose) after you have not eaten for a while (fasting). You may have this done every 1-3 years. Discuss your test results, treatment options, and if necessary, the need for more tests with your health care provider. Vaccines  Your health care provider may recommend certain vaccines, such as: Influenza vaccine. This is recommended every year. Tetanus, diphtheria, and acellular pertussis (Tdap, Td) vaccine. You may need a Td booster every 10 years. Zoster vaccine. You may need this after age 70. Pneumococcal 13-valent conjugate (PCV13) vaccine. You may need this if you have certain conditions and have not been vaccinated. Pneumococcal polysaccharide (PPSV23) vaccine. You may need one or two doses if you smoke cigarettes or if you have certain conditions. Talk to your health care provider about which screenings and vaccines you need and how often you need them. This information is not intended to replace advice given to you by your health care provider. Make sure you discuss any questions you have with your health care provider. Document Released: 05/11/2015 Document Revised: 01/02/2016 Document Reviewed: 02/13/2015 Elsevier Interactive Patient Education  2017 [employer] Prevention in the Home Falls can cause injuries. They can happen to people of all ages. There are many things you can do to make your home safe and to help prevent falls. What can I do on the outside of my home? Regularly fix the edges of walkways and driveways and fix  any cracks. Remove anything that might make you trip as you walk through a door, such as a raised step or threshold. Trim any bushes or trees on the path to your home. Use bright outdoor lighting. Clear any walking paths of anything that might make someone trip, such as rocks or tools. Regularly check to see if handrails are loose or broken. Make sure that both sides of any steps have handrails. Any raised decks and porches should have guardrails on the edges. Have any leaves, snow, or ice cleared regularly. Use sand or salt on walking paths during winter. Clean up any spills in your garage right away. This includes oil or grease spills. What can I do in the bathroom? Use night lights. Install grab bars by the toilet and in the tub and shower. Do not use towel bars as grab bars. Use non-skid mats or decals in the tub or shower. If you need to sit down in the shower, use a plastic, non-slip stool. Keep the floor dry. Clean up any water that spills on the floor as soon as it happens. Remove soap buildup in the tub or shower regularly. Attach bath  mats securely with double-sided non-slip rug tape. Do not have throw rugs and other things on the floor that can make you trip. What can I do in the bedroom? Use night lights. Make sure that you have a light by your bed that is easy to reach. Do not use any sheets or blankets that are too big for your bed. They should not hang down onto the floor. Have a firm chair that has side arms. You can use this for support while you get dressed. Do not have throw rugs and other things on the floor that can make you trip. What can I do in the kitchen? Clean up any spills right away. Avoid walking on wet floors. Keep items that you use a lot in easy-to-reach places. If you need to reach something above you, use a strong step stool that has a grab bar. Keep electrical cords out of the way. Do not use floor polish or wax that makes floors slippery. If you  must use wax, use non-skid floor wax. Do not have throw rugs and other things on the floor that can make you trip. What can I do with my stairs? Do not leave any items on the stairs. Make sure that there are handrails on both sides of the stairs and use them. Fix handrails that are broken or loose. Make sure that handrails are as long as the stairways. Check any carpeting to make sure that it is firmly attached to the stairs. Fix any carpet that is loose or worn. Avoid having throw rugs at the top or bottom of the stairs. If you do have throw rugs, attach them to the floor with carpet tape. Make sure that you have a light switch at the top of the stairs and the bottom of the stairs. If you do not have them, ask someone to add them for you. What else can I do to help prevent falls? Wear shoes that: Do not have high heels. Have rubber bottoms. Are comfortable and fit you well. Are closed at the toe. Do not wear sandals. If you use a stepladder: Make sure that it is fully opened. Do not climb a closed stepladder. Make sure that both sides of the stepladder are locked into place. Ask someone to hold it for you, if possible. Clearly mark and make sure that you can see: Any grab bars or handrails. First and last steps. Where the edge of each step is. Use tools that help you move around (mobility aids) if they are needed. These include: Canes. Walkers. Scooters. Crutches. Turn on the lights when you go into a dark area. Replace any light bulbs as soon as they burn out. Set up your furniture so you have a clear path. Avoid moving your furniture around. If any of your floors are uneven, fix them. If there are any pets around you, be aware of where they are. Review your medicines with your doctor. Some medicines can make you feel dizzy. This can increase your chance of falling. Ask your doctor what other things that you can do to help prevent falls. This information is not intended to replace  advice given to you by your health care provider. Make sure you discuss any questions you have with your health care provider. Document Released: 02/08/2009 Document Revised: 09/20/2015 Document Reviewed: 05/19/2014 Elsevier Interactive Patient Education  2017 [employer].

## 2022-05-08 ENCOUNTER — Encounter: Payer: Medicare HMO | Admitting: *Deleted

## 2022-05-08 ENCOUNTER — Ambulatory Visit (INDEPENDENT_AMBULATORY_CARE_PROVIDER_SITE_OTHER): Payer: Medicare HMO

## 2022-05-08 VITALS — Ht 69.0 in | Wt 260.0 lb

## 2022-05-08 DIAGNOSIS — Z0000 Encounter for general adult medical examination without abnormal findings: Secondary | ICD-10-CM | POA: Diagnosis not present

## 2022-05-15 ENCOUNTER — Encounter: Payer: Self-pay | Admitting: *Deleted

## 2022-05-15 ENCOUNTER — Ambulatory Visit: Payer: Self-pay | Admitting: *Deleted

## 2022-05-15 NOTE — Patient Outreach (Signed)
  Care Coordination   Follow Up Visit Note   05/15/2022 Name: [name] MRN: [id number] DOB: 10-12-1960  [name] is a 62 y.o. year old male who sees Pickard, Allen Mcgee, MD for primary care. I spoke with  [name] by phone today.  What matters to the patients health and wellness today?  Pending appt for labs related to kidney function, COPD management of care and pain management related to pending orthopedic appointment for possible injections.   Goals Addressed               This Visit's Progress     improve kidney functioning labs [facility]) (pt-stated)        Care Coordination Interventions: UPDATE Pt reports a pending appointment on 1/22 with Dr. Dennard [name] for ongoing labs to check in any improvement with previous changes other then stop Ibuprofen and started on Tylenol. Will follow up next month with any new results or changes.  Assessed SHOD barriers with no issues reported.       manage COPD/upper respiratory symptoms ([facility]) (pt-stated)        Care Coordination Interventions:UPDATE Pt will follow up with PCP for ongoing prescription medications for pt's inhalers. Discuss for better positions for sleeping habits. Pt can not sleep on his back.  Will continue to encouraged adherence to the plan of care.       Manage right  shoulder pain ([facility]) (pt-stated)        Care Coordination Interventions: UPDATE  Pt completed his recent appointment and pt recommended to see an orthopedic provider for possible injections or possible future MRI (awaiting pre-auth approval) Pt current taking tylenol and some pain management (specialist) medication if needed. Encouraged use OTC and prescribed medication until examined by his provider.         SDOH assessments and interventions completed:  Yes  SDOH Interventions Today    Flowsheet Row Most Recent Value  SDOH Interventions   Food Insecurity Interventions Intervention Not Indicated  Housing Interventions Intervention Not  Indicated  Transportation Interventions Intervention Not Indicated  Utilities Interventions Intervention Not Indicated        Care Coordination Interventions:  Yes, provided   Follow up plan: Follow up call scheduled for 2/16/243 @ 11:30AM    Encounter Outcome:  Pt. Visit Completed   Allen Mina, RN Care Management Coordinator [place] Office [phone]

## 2022-05-15 NOTE — Patient Instructions (Signed)
Visit Information  Thank you for taking time to visit with me today. Please don't hesitate to contact me if I can be of assistance to you.   Following are the goals we discussed today:   Goals Addressed               This Visit's Progress     improve kidney functioning labs [facility]) (pt-stated)        Care Coordination Interventions: UPDATE Pt reports a pending appointment on 1/22 with Dr. Dennard Schaumann for ongoing labs to check in any improvement with previous changes other then stop Ibuprofen and started on Tylenol. Will follow up next month with any new results or changes.  Assessed SHOD barriers with no issues reported.       manage COPD/upper respiratory symptoms ([facility]) (pt-stated)        Care Coordination Interventions:UPDATE Pt will follow up with PCP for ongoing prescription medications for pt's inhalers. Discuss for better positions for sleeping habits. Pt can not sleep on his back.  Will continue to encouraged adherence to the plan of care.       Manage right  shoulder pain ([facility]) (pt-stated)        Care Coordination Interventions: UPDATE  Pt completed his recent appointment and pt recommended to see an orthopedic provider for possible injections or possible future MRI (awaiting pre-auth approval) Pt current taking tylenol and some pain management (specialist) medication if needed. Encouraged use OTC and prescribed medication until examined by his provider.         Our next appointment is by telephone on 06/13/2022 at 11:30 AM  Please call the care guide team at [phone] if you need to cancel or reschedule your appointment.   If you are experiencing a Mental Health or [place] or need someone to talk to, please call the Suicide and Crisis Lifeline: 988  Patient verbalizes understanding of instructions and care plan provided today and agrees to view in [place]. Active MyChart status and patient understanding of how to access instructions and care plan via  MyChart confirmed with patient.      Raina Mina, RN Care Management Coordinator [place] Office [phone]

## 2022-05-19 ENCOUNTER — Encounter: Payer: Self-pay | Admitting: Family Medicine

## 2022-05-19 ENCOUNTER — Ambulatory Visit (INDEPENDENT_AMBULATORY_CARE_PROVIDER_SITE_OTHER): Payer: Medicare HMO | Admitting: Family Medicine

## 2022-05-19 VITALS — BP 124/82 | HR 52 | Temp 98.1°F | Ht 69.0 in | Wt 259.2 lb

## 2022-05-19 DIAGNOSIS — N1831 Chronic kidney disease, stage 3a: Secondary | ICD-10-CM

## 2022-05-19 DIAGNOSIS — I252 Old myocardial infarction: Secondary | ICD-10-CM | POA: Diagnosis not present

## 2022-05-19 DIAGNOSIS — I739 Peripheral vascular disease, unspecified: Secondary | ICD-10-CM

## 2022-05-19 DIAGNOSIS — E782 Mixed hyperlipidemia: Secondary | ICD-10-CM

## 2022-05-19 NOTE — Progress Notes (Signed)
Subjective:    Patient ID: [name], male    DOB: 1960-07-30, 62 y.o.   MRN: [id number]  Patient has a history of coronary artery disease status post CABG in 2010, hypertension, peripheral vascular disease, and chronic kidney disease.  Last GFR was 50.  He is here today to recheck this.  He has stopped taking all NSAIDs.  I reviewed his medication list with him.  He denies any chest pain shortness of breath or dyspnea on exertion.  He is avoiding smoking Past Medical History:  Diagnosis Date   Acute MI, inferior wall ([place]) 04/2009   Chest pain    COPD (chronic obstructive pulmonary disease) (HCC)    Coronary artery disease    STATUS POST CABG 01/2009   Dyslipidemia    Hyperlipidemia    Hypertension    Orthostatic hypotension    Peripheral vascular disease (HCC)    Syncope and collapse    Past Surgical History:  Procedure Laterality Date   ANKLE SURGERY     x 2   COLONOSCOPY  April 2013   CORONARY ANGIOPLASTY     CORONARY ARTERY BYPASS GRAFT  2010   FOOT SURGERY     HERNIA REPAIR  01/23/12   umb hernia   Kidney stimulator     LEFT HEART CATH AND CORS/GRAFTS ANGIOGRAPHY N/A 06/02/2016   Procedure: Left Heart Cath and Cors/Grafts Angiography;  Surgeon: Lorretta Harp, MD;  Location: [place] [facility];  Service: Cardiovascular;  Laterality: N/A;   LEFT HEART CATHETERIZATION WITH CORONARY/GRAFT ANGIOGRAM N/A 07/08/2012   Procedure: LEFT HEART CATHETERIZATION WITH Beatrix Fetters;  Surgeon: Thayer Headings, MD;  Location: [facility] CATH LAB;  Service: Cardiovascular;  Laterality: N/A;   PR VEIN BYPASS GRAFT,AORTO-FEM-POP     STERNAL WIRES REMOVAL  07/08/2011   Procedure: STERNAL WIRES REMOVAL;  Surgeon: Grace Isaac, MD;  Location: [place];  Service: Open Heart Surgery;  Laterality: N/A;  removal of 4th wire   Current Outpatient Medications on File Prior to Visit  Medication Sig Dispense Refill   amLODipine (NORVASC) 10 MG tablet TAKE 1 TABLET(10 MG) BY MOUTH DAILY 90  tablet 3   aspirin EC 81 MG tablet Take 1 tablet (81 mg total) by mouth every morning. 90 tablet 3   atorvastatin (LIPITOR) 80 MG tablet Take 1 tablet (80 mg total) by mouth at bedtime. PT NEEDS OV FOR FURTHER REFILLS, NOT SEEN SINCE 2021 30 tablet 0   clopidogrel (PLAVIX) 75 MG tablet TAKE 1 TABLET(75 MG) BY MOUTH DAILY 90 tablet 3   Evolocumab (REPATHA SURECLICK) 003 MG/ML SOAJ Inject 140 mg into the skin every 14 (fourteen) days. 6 mL 3   gabapentin (NEURONTIN) 800 MG tablet Take 800 mg by mouth 3 (three) times daily.     losartan (COZAAR) 100 MG tablet Take 1 tablet (100 mg total) by mouth daily. 90 tablet 0   oxyCODONE-acetaminophen (PERCOCET) 10-325 MG tablet Take 1 tablet by mouth every 6 (six) hours as needed.     furosemide (LASIX) 20 MG tablet TAKE 1 TABLET BY MOUTH 3 TIMES A WEEK AS NEEDED (Patient not taking: Reported on 05/19/2022) 45 tablet 3   No current facility-administered medications on file prior to visit.     Allergies  Allergen Reactions   Rosuvastatin     Muscle aches. Other reaction(s): leg pain, Other Muscle aches. Other reaction(s): leg pain Other reaction(s): leg pain    Social History   Socioeconomic History   Marital status: Married  Spouse name: [name]   Number of children: 2   Years of education: Not on file   Highest education level: Not on file  Occupational History   Occupation: Disability  Tobacco Use   Smoking status: Former    Packs/day: 3.00    Years: 30.00    Total pack years: 90.00    Types: Cigarettes, Cigars    Quit date: 01/26/2009    Years since quitting: 13.3   Smokeless tobacco: Never  Vaping Use   Vaping Use: Never used  Substance and Sexual Activity   Alcohol use: No   Drug use: No   Sexual activity: Not Currently  Other Topics Concern   Not on file  Social History Narrative   Married x 37 years. [name])    Social Determinants of Health   Financial Resource Strain: Low Risk  (05/08/2022)   Overall Financial  Resource Strain (CARDIA)    Difficulty of Paying Living Expenses: Not hard at all  Food Insecurity: No Food Insecurity (05/15/2022)   Hunger Vital Sign    Worried About Running Out of Food in the Last Year: Never true    Ran Out of Food in the Last Year: Never true  Transportation Needs: No Transportation Needs (05/15/2022)   PRAPARE - Hydrologist (Medical): No    Lack of Transportation (Non-Medical): No  Physical Activity: Sufficiently Active (05/08/2022)   Exercise Vital Sign    Days of Exercise per Week: 5 days    Minutes of Exercise per Session: 30 min  Stress: No Stress Concern Present (05/08/2022)   [place]    Feeling of Stress : Not at all  Social Connections: Moderately Isolated (05/08/2022)   Social Connection and Isolation Panel [NHANES]    Frequency of Communication with Friends and Family: More than three times a week    Frequency of Social Gatherings with Friends and Family: Three times a week    Attends Religious Services: Never    Active Member of Clubs or Organizations: No    Attends Archivist Meetings: Never    Marital Status: Married  Human resources officer Violence: Not At Risk (05/15/2022)   Humiliation, Afraid, Rape, and Kick questionnaire    Fear of Current or Ex-Partner: No    Emotionally Abused: No    Physically Abused: No    Sexually Abused: No   Family History  Problem Relation Age of Onset   Coronary artery disease Father 28   Diabetes Father    Hyperlipidemia Father    Heart disease Father        before age 103   Hypertension Father    Cancer Mother        uterine   Hyperlipidemia Mother    Diabetes Mother    Hypertension Mother    Hypertension Brother      Review of Systems  All other systems reviewed and are negative.      Objective:   Physical Exam Vitals reviewed.  Constitutional:      General: He is not in acute distress.     Appearance: He is well-developed. He is not diaphoretic.  HENT:     Head: Normocephalic and atraumatic.     Right Ear: External ear normal.     Left Ear: External ear normal.     Nose: Nose normal. No congestion.     Mouth/Throat:     Pharynx: No oropharyngeal exudate.  Neck:  Thyroid: No thyromegaly.     Vascular: No JVD.     Trachea: No tracheal deviation.  Cardiovascular:     Rate and Rhythm: Normal rate and regular rhythm.     Heart sounds: Normal heart sounds. No murmur heard.    No friction rub. No gallop.  Pulmonary:     Effort: Pulmonary effort is normal. No respiratory distress.     Breath sounds: No stridor. Wheezing and rhonchi present. No rales.  Chest:     Chest wall: No tenderness.  Musculoskeletal:        General: No tenderness or deformity. Normal range of motion.     Cervical back: Normal range of motion and neck supple.  Lymphadenopathy:     Cervical: No cervical adenopathy.  Skin:    General: Skin is warm.     Coloration: Skin is not pale.     Findings: No erythema or rash.  Neurological:     Mental Status: He is alert and oriented to person, place, and time.     Cranial Nerves: No cranial nerve deficit.     Motor: No abnormal muscle tone.     Coordination: Coordination normal.     Deep Tendon Reflexes: Reflexes are normal and symmetric.  Psychiatric:        Behavior: Behavior normal.        Thought Content: Thought content normal.        Judgment: Judgment normal.           Assessment & Plan:  Hx of Inferior MI - Plan: CBC with Differential/Platelet, Lipid panel, COMPLETE METABOLIC PANEL WITH GFR, Protein / Creatinine Ratio, Urine  Peripheral vascular disease ([place]) - Plan: CBC with Differential/Platelet, Lipid panel, COMPLETE METABOLIC PANEL WITH GFR, Protein / Creatinine Ratio, Urine  Mixed hyperlipidemia - Plan: CBC with Differential/Platelet, Lipid panel, COMPLETE METABOLIC PANEL WITH GFR, Protein / Creatinine Ratio, Urine  Stage 3a  chronic kidney disease (HCC) - Plan: CBC with Differential/Platelet, Lipid panel, COMPLETE METABOLIC PANEL WITH GFR, Protein / Creatinine Ratio, Urine Blood pressure today is well-controlled.  Patient is having some stiffness in his joints are recommended trying Voltaren gel for this given his chronic kidney disease.  He is on a statin as well as Repatha so we will check a fasting lipid panel.  Goal LDL cholesterol is less than 55.  Also monitor his GFR and check a urine protein creatinine ratio.  If the protein creatinine ratio is elevated greater than 200 consider adding [place].

## 2022-05-20 ENCOUNTER — Telehealth: Payer: Self-pay | Admitting: *Deleted

## 2022-05-20 LAB — CBC WITH DIFFERENTIAL/PLATELET
Absolute Monocytes: 465 cells/uL (ref 200–950)
Basophils Absolute: 50 cells/uL (ref 0–200)
Basophils Relative: 1 %
Eosinophils Absolute: 150 cells/uL (ref 15–500)
Eosinophils Relative: 3 %
HCT: 43.3 % (ref 38.5–50.0)
Hemoglobin: 14.1 g/dL (ref 13.2–17.1)
Lymphs Abs: 1205 cells/uL (ref 850–3900)
MCH: 27.8 pg (ref 27.0–33.0)
MCHC: 32.6 g/dL (ref 32.0–36.0)
MCV: 85.2 fL (ref 80.0–100.0)
MPV: 11.5 fL (ref 7.5–12.5)
Monocytes Relative: 9.3 %
Neutro Abs: 3130 cells/uL (ref 1500–7800)
Neutrophils Relative %: 62.6 %
Platelets: 178 10*3/uL (ref 140–400)
RBC: 5.08 10*6/uL (ref 4.20–5.80)
RDW: 12.7 % (ref 11.0–15.0)
Total Lymphocyte: 24.1 %
WBC: 5 10*3/uL (ref 3.8–10.8)

## 2022-05-20 LAB — COMPLETE METABOLIC PANEL WITH GFR
AG Ratio: 1.8 (calc) (ref 1.0–2.5)
ALT: 14 U/L (ref 9–46)
AST: 17 U/L (ref 10–35)
Albumin: 4.4 g/dL (ref 3.6–5.1)
Alkaline phosphatase (APISO): 71 U/L (ref 35–144)
BUN: 16 mg/dL (ref 7–25)
CO2: 27 mmol/L (ref 20–32)
Calcium: 9.4 mg/dL (ref 8.6–10.3)
Chloride: 108 mmol/L (ref 98–110)
Creat: 1.26 mg/dL (ref 0.70–1.30)
Globulin: 2.5 g/dL (calc) (ref 1.9–3.7)
Glucose, Bld: 120 mg/dL — ABNORMAL HIGH (ref 65–99)
Potassium: 4.4 mmol/L (ref 3.5–5.3)
Sodium: 143 mmol/L (ref 135–146)
Total Bilirubin: 0.8 mg/dL (ref 0.2–1.2)
Total Protein: 6.9 g/dL (ref 6.1–8.1)
eGFR: 66 mL/min/{1.73_m2} (ref >=60)

## 2022-05-20 LAB — LIPID PANEL
Cholesterol: 76 mg/dL (ref <200)
HDL: 40 mg/dL (ref >=40)
LDL Cholesterol (Calc): 20 mg/dL (calc)
Non-HDL Cholesterol (Calc): 36 mg/dL (calc) (ref <130)
Total CHOL/HDL Ratio: 1.9 (calc) (ref <5.0)
Triglycerides: 81 mg/dL (ref <150)

## 2022-05-20 LAB — PROTEIN / CREATININE RATIO, URINE
Creatinine, Urine: 133 mg/dL (ref 20–320)
Protein/Creat Ratio: 248 mg/g creat — ABNORMAL HIGH (ref 25–148)
Protein/Creatinine Ratio: 0.248 mg/mg creat — ABNORMAL HIGH (ref 0.025–0.148)
Total Protein, Urine: 33 mg/dL — ABNORMAL HIGH (ref 5–25)

## 2022-05-20 NOTE — Telephone Encounter (Signed)
   Name: DEKLEN POPELKA  DOB: October 17, 1960  MRN: [id number]  Primary Cardiologist: Mertie Moores, MD  Chart reviewed as part of pre-operative protocol coverage. Because of AMEAR STROJNY past medical history and time since last visit, he will require a follow-up in-office visit in order to better assess preoperative cardiovascular risk.  Pre-op covering staff: - Please schedule appointment and call patient to inform them. If patient already had an upcoming appointment within acceptable timeframe, please add "pre-op clearance" to the appointment notes so provider is aware. - Please contact requesting surgeon's office via preferred method (i.e, phone, fax) to inform them of need for appointment prior to surgery.   Lenna Sciara, NP  05/20/2022, 4:14 PM

## 2022-05-20 NOTE — Telephone Encounter (Signed)
   Pre-operative Risk Assessment    Patient Name: [name]  DOB: 27-May-1960 MRN: [id number]      Request for Surgical Clearance    Procedure:   ESI (EPIDURAL STEROID INJECTION)  Date of Surgery:  Clearance TBD                                 Surgeon:  DR. Brandt Loosen Group or Practice Name:  [facility] Phone number:  [phone] Fax number:  [phone]   Type of Clearance Requested:   - Medical  - Pharmacy:  Hold Clopidogrel (Plavix) x 7 DAYS PRIOR   Type of Anesthesia:  Not Indicated   Additional requests/questions:    Jiles Prows   05/20/2022, 3:43 PM

## 2022-05-21 ENCOUNTER — Encounter: Payer: Self-pay | Admitting: Cardiovascular Disease

## 2022-05-21 ENCOUNTER — Ambulatory Visit: Payer: Medicare HMO | Attending: Cardiovascular Disease | Admitting: Cardiovascular Disease

## 2022-05-21 VITALS — BP 150/80 | HR 58 | Ht 69.0 in | Wt 262.6 lb

## 2022-05-21 DIAGNOSIS — E782 Mixed hyperlipidemia: Secondary | ICD-10-CM | POA: Diagnosis not present

## 2022-05-21 DIAGNOSIS — I10 Essential (primary) hypertension: Secondary | ICD-10-CM | POA: Diagnosis not present

## 2022-05-21 DIAGNOSIS — I2510 Atherosclerotic heart disease of native coronary artery without angina pectoris: Secondary | ICD-10-CM | POA: Diagnosis not present

## 2022-05-21 NOTE — Patient Instructions (Signed)
Medication Instructions:  Your physician recommends that you continue on your current medications as directed. Please refer to the Current Medication list given to you today.  *If you need a refill on your cardiac medications before your next appointment, please call your pharmacy*   Lab Work: NONE If you have labs (blood work) drawn today and your tests are completely normal, you will receive your results only by: [facility] (if you have MyChart) OR A paper copy in the mail If you have any lab test that is abnormal or we need to change your treatment, we will call you to review the results.   Testing/Procedures: NONE   Follow-Up: At [facility], you and your health needs are our priority.  As part of our continuing mission to provide you with exceptional heart care, we have created designated Provider Care Teams.  These Care Teams include your primary Cardiologist (physician) and Advanced Practice Providers (APPs -  Physician Assistants and Nurse Practitioners) who all work together to provide you with the care you need, when you need it.  We recommend signing up for the patient portal called "MyChart".  Sign up information is provided on this After Visit Summary.  MyChart is used to connect with patients for Virtual Visits (Telemedicine).  Patients are able to view lab/test results, encounter notes, upcoming appointments, etc.  Non-urgent messages can be sent to your provider as well.   To learn more about what you can do with MyChart, go to [email or web address].    Your next appointment:   6 month(s)  Provider:   Richardson Dopp, PA or Christen Bame, NP

## 2022-05-21 NOTE — Telephone Encounter (Signed)
He needs to have a shoulder injection. He is at low risk for his upcoming shoulder injection. It is okay for him to hold his Plavix for 5 days. He should restart Plavix today following his injection.   PN

## 2022-05-21 NOTE — Progress Notes (Signed)
Cardiology Office Note   Date:  05/21/2022   ID:  [name], DOB 1960/09/19, MRN [id number]  PCP:  Susy Frizzle, MD  Cardiologist:   Mertie Moores, MD   Chief Complaint  Patient presents with   Coronary Artery Disease        Hypertension        1. Coronary artery disease: He status post CABG in October , 2010. He status post inferior wall myocardial infarction in January, 2011 with multiple stents to the native right coronary artery Dr. Burt Knack 2. Peripheral vascular disease-status post iliac stenting 3. Dyslipidemia 4.. Hypertension  Notes prior to 2014:   [name] is a 62 yo with CAD and PVD.  He has had problems with his left foot and has not been able to exercise.  He continues to have lots of sternal wound pain whenever he moves his arm or lifts anything heavy. 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   [name] has had some muscle aches which resolved when he stopped his Crestor.  He has hx of severe CAD and PVD.    He has had a nagging left ankle injury that keeps him from walking much.  May 12 ,2014: He continues to have occasional CP and frequent episodes of dyspnea. He has been told that he has COPD.   He is still in a left leg splint.  August 24, 2013:  Sherman was admitted to the hospital in January with chest discomfort. A Lexiscan  Myoview study was negative for ischemia. His left systolic function is normal. He's feeling a bit better.  He has been in a boot for a chronic left ankle injury for the past several years.    07/07/2014   [name] is a 62 y.o. male who presents for HTN Has been sticking to his diet.  Walks every day.  Has lost 8-9 lbs over the past several weeks.  He is very lightheaded when he first gets up in the AM.  Has been getting worse over the past month or so.  Also has frequent episodes of orthostatic hypotension.  BP has been low on occasion. Today's BP is  normal  - he is off his BP meds.  Avoids salty foods.  No CP or dyspnea. Was told to stay off his BP meds until he saw me. He is feeling better off the BP meds    Nov. 21, 2016:  Doing well.   BP was normal when he left his house this am  A bit higher here.  Exercising a bit.  Still having left leg issus.    Sep 20, 2015:  Is having some episodes of chest tightness Occurs anytime - rest or activity . Last 30 seconds.  Has not tried an NTG.  Has more foot problems ,  Not able to exercise much .  Has lots of heartburn  Issues.   Seems different than angina   Sep 07, 2017  [name] is seen for follow up of his OSA  Was last seen by Pecolia Ades in February, 2019.  He is not had any episodes of chest pain or shortness of breath.  He is been diagnosed with obstructive sleep apnea.  He has a CPAP mask but is not tolerating it all that well.  He typically can tolerate it for about 4 hours at night.  Limited by an ankle injury,   [place]  4 years ago , had a compund fracture of left ankle Sees the eorthopedist at [facility]    BP at home is normal .   Has significant DOE.  No angina   Jan. 13, 2023 [name] is seen for follow up of his HTN, CAD, PVD  Has a right shoulder pain  Symptoms are not similar to his angina in 2010 .  Is on prednisone  He was seen up at [facility].  He had troponin levels drawn which were mildly elevated.  We will recheck troponin levels today.  EKG is unremarkable.  His symptoms are not at all similar to his previous episodes of angina.  I do not think that his right neck and shoulder pain are due to a cardiac etiology. Has COPD, CAD , HLD   May 21, 2022:  [name] is seen today for follow-up of his coronary artery disease, hypertension, peripheral vascular disease. Avoids salt  No CP ,  Has chronic COPD   He is on atorvastatin 80 mg a day, Repatha 140 mg injected every 14 days.  His last LDL is 20 HDL is 40 Total cholesterol 76 Triglyceride level  is 81 Hemoglobin A1c is 5.9  ( 2021)   BP is elevated.  Needs better diet, more exercise, weight loss   Spends lots of time taking care wife [name] who has had 4 strokes. She is bed bound now.  Has limited help. Has not been able to exercise  She still smokes occasionally .    Needs to have a shoulder injection  Needs pre op clearance   He is at low risk for his shoulder injection.  He may hold his Plavix for 5 days prior to the shoulder injection.  Restart Plavix as soon as is safe from a surgical standpoint.  Past Medical History:  Diagnosis Date   Acute MI, inferior wall ([place]) 04/2009   Chest pain    COPD (chronic obstructive pulmonary disease) (HCC)    Coronary artery disease    STATUS POST CABG 01/2009   Dyslipidemia    Hyperlipidemia    Hypertension    Orthostatic hypotension    Peripheral vascular disease (HCC)    Syncope and collapse     Past Surgical History:  Procedure Laterality Date   ANKLE SURGERY     x 2   COLONOSCOPY  April 2013   CORONARY ANGIOPLASTY     CORONARY ARTERY BYPASS GRAFT  2010   FOOT SURGERY     HERNIA REPAIR  01/23/12   umb hernia   Kidney stimulator     LEFT HEART CATH AND CORS/GRAFTS ANGIOGRAPHY N/A 06/02/2016   Procedure: Left Heart Cath and Cors/Grafts Angiography;  Surgeon: Lorretta Harp, MD;  Location: [facility];  Service: Cardiovascular;  Laterality: N/A;   LEFT HEART CATHETERIZATION WITH CORONARY/GRAFT ANGIOGRAM N/A 07/08/2012   Procedure: LEFT HEART CATHETERIZATION WITH Beatrix Fetters;  Surgeon: Thayer Headings, MD;  Location: [facility] CATH LAB;  Service: Cardiovascular;  Laterality: N/A;   PR VEIN BYPASS GRAFT,AORTO-FEM-POP     STERNAL WIRES REMOVAL  07/08/2011   Procedure: STERNAL WIRES REMOVAL;  Surgeon: Grace Isaac, MD;  Location: [facility];  Service: Open Heart Surgery;  Laterality: N/A;  removal of 4th wire     Current Outpatient Medications  Medication Sig Dispense Refill   amLODipine (NORVASC) 10 MG tablet  TAKE 1 TABLET(10 MG) BY MOUTH DAILY 90 tablet 3   aspirin EC 81 MG tablet Take 1 tablet (81  mg total) by mouth every morning. 90 tablet 3   atorvastatin (LIPITOR) 80 MG tablet Take 1 tablet (80 mg total) by mouth at bedtime. PT NEEDS OV FOR FURTHER REFILLS, NOT SEEN SINCE 2021 30 tablet 0   clopidogrel (PLAVIX) 75 MG tablet TAKE 1 TABLET(75 MG) BY MOUTH DAILY 90 tablet 3   Evolocumab (REPATHA SURECLICK) 448 MG/ML SOAJ Inject 140 mg into the skin every 14 (fourteen) days. 6 mL 3   furosemide (LASIX) 20 MG tablet TAKE 1 TABLET BY MOUTH 3 TIMES A WEEK AS NEEDED 45 tablet 3   gabapentin (NEURONTIN) 800 MG tablet Take 800 mg by mouth 3 (three) times daily.     losartan (COZAAR) 100 MG tablet Take 1 tablet (100 mg total) by mouth daily. 90 tablet 0   oxyCODONE-acetaminophen (PERCOCET) 10-325 MG tablet Take 1 tablet by mouth every 6 (six) hours as needed.     No current facility-administered medications for this visit.    Allergies:   Rosuvastatin    Social History:  The patient  reports that he quit smoking about 13 years ago. His smoking use included cigarettes and cigars. He has a 90.00 pack-year smoking history. He has never used smokeless tobacco. He reports that he does not drink alcohol and does not use drugs.   Family History:  The patient's family history includes Cancer in his mother; Coronary artery disease (age of onset: 102) in his father; Diabetes in his father and mother; Heart disease in his father; Hyperlipidemia in his father and mother; Hypertension in his brother, father, and mother.    ROS:  Noted in current hx ,. Otherwise negativ e   Physical Exam: Blood pressure (!) 150/80, pulse (!) 58, height '5\' 9"'$  (1.753 m), weight 262 lb 9.6 oz (119.1 kg), SpO2 97 %.  HYPERTENSION CONTROL Vitals:   05/21/22 1457 05/21/22 1551  BP: (!) 158/86 (!) 150/80    The patient's blood pressure is elevated above target today.  In order to address the patient's elevated BP: Blood pressure  will be monitored at home to determine if medication changes need to be made.       GEN:  Well nourished, well developed in no acute distress HEENT: Normal NECK: No JVD; No carotid bruits LYMPHATICS: No lymphadenopathy CARDIAC: RRR , no murmurs, rubs, gallops RESPIRATORY:  Clear to auscultation without rales, wheezing or rhonchi  ABDOMEN: Soft, non-tender, non-distended MUSCULOSKELETAL:  No edema; No deformity  SKIN: Warm and dry NEUROLOGIC:  Alert and oriented x 3   EKG: May 21, 2022: Sinus bradycardia at 58.  Occasional premature ventricular contractions.  Minimal voltage criteria for LVH.  Previous inferior wall myocardial infarction.  Recent Labs: 05/19/2022: ALT 14; BUN 16; Creat 1.26; Hemoglobin 14.1; Platelets 178; Potassium 4.4; Sodium 143    Lipid Panel    Component Value Date/Time   CHOL 76 05/19/2022 0820   CHOL 110 05/10/2021 1042   TRIG 81 05/19/2022 0820   HDL 40 05/19/2022 0820   HDL 43 05/10/2021 1042   CHOLHDL 1.9 05/19/2022 0820   VLDL 24 11/06/2016 0849   LDLCALC 20 05/19/2022 0820   LDLDIRECT 117 (H) 02/23/2017 1549      Wt Readings from Last 3 Encounters:  05/21/22 262 lb 9.6 oz (119.1 kg)  05/19/22 259 lb 3.2 oz (117.6 kg)  05/08/22 260 lb (117.9 kg)      Other studies Reviewed: Additional studies/ records that were reviewed today include: . Review of the above records demonstrates:  ASSESSMENT AND PLAN:  1. Coronary artery disease: Status post coronary artery bypass grafting in 2010.   He has been on Plavix 75 mg a day.  He is off aspirin.  He needs to have a shoulder injection.  He is at low risk for his upcoming shoulder injection.  It is okay for him to hold his Plavix for 5 days.  He should restart Plavix today following his injection.  2. Peripheral vascular disease- stable   3. Dyslipidemia-     4.. Hypertension-blood pressure is minimally elevated today.  He admits that he is not been following his diet quite as carefully.   He is also not been able to exercise because he is taking care of his wife, [name] almost constantly.  She is now bedbound after having 4 strokes.  Encouraged him to eat better.  I encouraged him to work on exercise program and weight loss program.  Continue same medications.  Will have him see my APP in 6 months for follow-up visit.    5.  Obstructive sleep [place]:     Current medicines are reviewed at length with the patient today.  The patient does not have concerns regarding medicines.  The following changes have been made:  no change  Labs/ tests ordered today include:   Orders Placed This Encounter  Procedures   EKG 12-Lead    Disposition:   to see Richardson Dopp, PA in 1 year     Signed, Mertie Moores, MD  05/21/2022 5:52 PM    [facility] [address], [place], [place]  [address] Phone: [phone]; Fax: [phone]

## 2022-05-21 NOTE — Telephone Encounter (Signed)
I s/w the pt and he is agreeable to needing an in office appt for pre op clearance. Pt last seen 05/10/21. Pt is due for yrly appt as well per recall in the chart. I was able to find an appt today with Dr. Acie Fredrickson at 3 pm. Pt agreeable to appt today. I will update all parties in this matter.   Pt thanked me for the help.

## 2022-05-22 NOTE — Telephone Encounter (Signed)
   Patient Name: [name]  DOB: Sep 02, 1960 MRN: [id number]  Primary Cardiologist: Mertie Moores, MD  Chart reviewed as part of pre-operative protocol coverage. Pre-op clearance already addressed by colleagues in earlier phone notes. To summarize recommendations:  - He needs to have a shoulder injection. He is at low risk for his upcoming shoulder injection. It is okay for him to hold his Plavix for 5 days. He should restart Plavix today following his injection.     Dr. Acie Fredrickson   Will route this bundled recommendation to requesting provider via Epic fax function and remove from pre-op pool. Please call with questions.  Elgie Collard, PA-C 05/22/2022 , 12:52 PM

## 2022-05-27 DIAGNOSIS — Z79899 Other long term (current) drug therapy: Secondary | ICD-10-CM | POA: Diagnosis not present

## 2022-05-27 DIAGNOSIS — G8921 Chronic pain due to trauma: Secondary | ICD-10-CM | POA: Diagnosis not present

## 2022-05-27 DIAGNOSIS — Z5181 Encounter for therapeutic drug level monitoring: Secondary | ICD-10-CM | POA: Diagnosis not present

## 2022-05-27 DIAGNOSIS — M79672 Pain in left foot: Secondary | ICD-10-CM | POA: Diagnosis not present

## 2022-05-27 DIAGNOSIS — M25572 Pain in left ankle and joints of left foot: Secondary | ICD-10-CM | POA: Diagnosis not present

## 2022-05-28 ENCOUNTER — Ambulatory Visit: Payer: Medicare HMO | Admitting: Physical Medicine and Rehabilitation

## 2022-05-28 ENCOUNTER — Other Ambulatory Visit: Payer: Self-pay

## 2022-05-28 ENCOUNTER — Ambulatory Visit: Payer: Self-pay

## 2022-05-28 VITALS — BP 173/90 | HR 52

## 2022-05-28 DIAGNOSIS — I739 Peripheral vascular disease, unspecified: Secondary | ICD-10-CM

## 2022-05-28 DIAGNOSIS — J449 Chronic obstructive pulmonary disease, unspecified: Secondary | ICD-10-CM

## 2022-05-28 DIAGNOSIS — M5412 Radiculopathy, cervical region: Secondary | ICD-10-CM | POA: Diagnosis not present

## 2022-05-28 DIAGNOSIS — I10 Essential (primary) hypertension: Secondary | ICD-10-CM

## 2022-05-28 DIAGNOSIS — I70219 Atherosclerosis of native arteries of extremities with intermittent claudication, unspecified extremity: Secondary | ICD-10-CM

## 2022-05-28 MED ORDER — METHYLPREDNISOLONE ACETATE 80 MG/ML IJ SUSP
80.0000 mg | Freq: Once | INTRAMUSCULAR | Status: AC
  Administered 2022-05-28: 80 mg

## 2022-05-28 NOTE — Patient Instructions (Signed)

## 2022-05-28 NOTE — Progress Notes (Signed)
Functional Pain Scale - descriptive words and definitions  Intense (8)    Cannot complete any ADLs without much assistance/cannot concentrate/conversation is difficult/unable to sleep and unable to use distraction. Severe range order  Average Pain 8-9   +Driver, -BT of Plavix for 5 days, -Dye Allergies.  Right neck pain and radiates down right arm

## 2022-05-28 NOTE — Procedures (Signed)
Cervical Epidural Steroid Injection - Interlaminar Approach with Fluoroscopic Guidance  Patient: [name]      Date of Birth: July 25, 1960 MRN: [id number] PCP: Susy Frizzle, MD      Visit Date: 05/28/2022   Universal Protocol:    Date/Time: 01/31/247:25 PM  Consent Given By: the patient  Position: PRONE  Additional Comments: Vital signs were monitored before and after the procedure. Patient was prepped and draped in the usual sterile fashion. The correct patient, procedure, and site was verified.   Injection Procedure Details:   Procedure diagnoses: Cervical radiculopathy [M54.12]    Meds Administered:  Meds ordered this encounter  Medications   methylPREDNISolone acetate (DEPO-MEDROL) injection 80 mg     Laterality: Right  Location/Site: C7-T1  Needle: 3.5 in., 20 ga. Tuohy  Needle Placement: Paramedian epidural space  Findings:  -Comments: Excellent flow of contrast into the epidural space.  Procedure Details: Using a paramedian approach from the side mentioned above, the region overlying the inferior lamina was localized under fluoroscopic visualization and the soft tissues overlying this structure were infiltrated with 4 ml. of 1% Lidocaine without Epinephrine. A # 20 gauge, Tuohy needle was inserted into the epidural space using a paramedian approach.  The epidural space was localized using loss of resistance along with contralateral oblique bi-planar fluoroscopic views.  After negative aspirate for air, blood, and CSF, a 2 ml. volume of Isovue-250 was injected into the epidural space and the flow of contrast was observed. Radiographs were obtained for documentation purposes.   The injectate was administered into the level noted above.  Additional Comments:  No complications occurred Dressing: 2 x 2 sterile gauze and Band-Aid    Post-procedure details: Patient was observed during the procedure. Post-procedure instructions were reviewed.  Patient  left the clinic in stable condition.

## 2022-05-28 NOTE — Progress Notes (Signed)
[name] - 62 y.o. male MRN [id number]  Date of birth: 23-May-1960  Office Visit Note: Visit Date: 05/28/2022 PCP: Susy Frizzle, MD Referred by: Susy Frizzle, MD  Subjective: Chief Complaint  Patient presents with   Neck - Pain   HPI:  [name] is a 62 y.o. male who comes in today for planned repeat Right C7-T1  Cervical Interlaminar epidural steroid injection with fluoroscopic guidance.  The patient has failed conservative care including home exercise, medications, time and activity modification.  This injection will be diagnostic and hopefully therapeutic.  Please see requesting physician notes for further details and justification. Patient received more than 50% pain relief from prior injection.  Patient had prior trigger point injections by me that were somewhat beneficial.  He had 2 cervical epidural injection performed at [facility] that were also beneficial but he is really unsure which one helped the most.  Will complete epidural injections today I will have him follow-up in a few weeks for possible trigger point injection if needed.  Referring: Barnet Pall, FNP   ROS Otherwise per HPI.  Assessment & Plan: Visit Diagnoses:    ICD-10-CM   1. Cervical radiculopathy  M54.12 XR C-ARM NO REPORT    Epidural Steroid injection    methylPREDNISolone acetate (DEPO-MEDROL) injection 80 mg      Plan: No additional findings.   Meds & Orders:  Meds ordered this encounter  Medications   methylPREDNISolone acetate (DEPO-MEDROL) injection 80 mg    Orders Placed This Encounter  Procedures   XR C-ARM NO REPORT   Epidural Steroid injection    Follow-up: Return in about 2 weeks (around 06/11/2022) for COnsider trigger point.   Procedures: No procedures performed  Cervical Epidural Steroid Injection - Interlaminar Approach with Fluoroscopic Guidance  Patient: [name]      Date of Birth: [birth year] MRN: [id number] PCP: Susy Frizzle,  MD      Visit Date: 05/28/2022   Universal Protocol:    Date/Time: 01/31/247:25 PM  Consent Given By: the patient  Position: PRONE  Additional Comments: Vital signs were monitored before and after the procedure. Patient was prepped and draped in the usual sterile fashion. The correct patient, procedure, and site was verified.   Injection Procedure Details:   Procedure diagnoses: Cervical radiculopathy [M54.12]    Meds Administered:  Meds ordered this encounter  Medications   methylPREDNISolone acetate (DEPO-MEDROL) injection 80 mg     Laterality: Right  Location/Site: C7-T1  Needle: 3.5 in., 20 ga. Tuohy  Needle Placement: Paramedian epidural space  Findings:  -Comments: Excellent flow of contrast into the epidural space.  Procedure Details: Using a paramedian approach from the side mentioned above, the region overlying the inferior lamina was localized under fluoroscopic visualization and the soft tissues overlying this structure were infiltrated with 4 ml. of 1% Lidocaine without Epinephrine. A # 20 gauge, Tuohy needle was inserted into the epidural space using a paramedian approach.  The epidural space was localized using loss of resistance along with contralateral oblique bi-planar fluoroscopic views.  After negative aspirate for air, blood, and CSF, a 2 ml. volume of Isovue-250 was injected into the epidural space and the flow of contrast was observed. Radiographs were obtained for documentation purposes.   The injectate was administered into the level noted above.  Additional Comments:  No complications occurred Dressing: 2 x 2 sterile gauze and Band-Aid    Post-procedure details: Patient was observed during the procedure. Post-procedure instructions  were reviewed.  Patient left the clinic in stable condition.   Clinical History: EXAM: MRI CERVICAL SPINE WITHOUT CONTRAST   TECHNIQUE: Multiplanar, multisequence MR imaging of the cervical spine  was performed. No intravenous contrast was administered.   COMPARISON:  Prior CT from 06/28/2021.   FINDINGS: Alignment: Straightening of the normal cervical lordosis. Trace degenerative retrolisthesis of C4 on C5.   Vertebrae: Vertebral body height maintained without acute or chronic fracture. Bone marrow signal intensity within normal limits. No discrete or worrisome osseous lesions. Minor discogenic reactive endplate changes present about the C6-7 interspace. No other abnormal marrow edema.   Cord: Normal signal and morphology.   Posterior Fossa, vertebral arteries, paraspinal tissues: Patchy signal abnormality noted involving the pons, most characteristic of chronic microvascular ischemic disease. Craniocervical junction normal. Paraspinous soft tissues within normal limits. Normal flow voids seen within the vertebral arteries bilaterally.   Disc levels:   C2-C3: Unremarkable.   C3-C4: Mild disc bulge with bilateral uncovertebral spurring. Flattening of the ventral thecal sac without significant spinal stenosis. Mild bilateral C4 foraminal stenosis.   C4-C5: Disc bulge with bilateral uncovertebral spurring, greater on the left. Flattening and partial effacement of the ventral thecal sac with resultant mild spinal stenosis. No frank cord impingement. Moderate left with mild right C5 foraminal narrowing.   C5-C6: Disc bulge with mild uncovertebral spurring. Mild left-sided facet hypertrophy. No spinal stenosis. Mild left C6 foraminal narrowing. Right neural foramina is patent.   C6-C7: Mild degenerative intervertebral disc space narrowing with diffuse disc osteophyte complex. Flattening and partial effacement of the ventral thecal sac with resultant mild spinal stenosis. Moderate bilateral C7 foraminal narrowing.   C7-T1: Minimal annular disc bulge. Left-sided facet hypertrophy. No spinal stenosis. Foramina remain patent.   IMPRESSION: 1. Multilevel cervical  spondylosis with resultant mild spinal stenosis at C4-5 and C6-7. 2. Multifactorial degenerative changes with resultant multilevel foraminal narrowing as above. Notable findings include mild bilateral C4 foraminal stenosis, moderate left with mild right C5 foraminal narrowing, with moderate bilateral C7 foraminal stenosis.     Electronically Signed   By: Jeannine Boga M.D.   On: 04/21/2022 02:13     Objective:  VS:  HT:    WT:   BMI:     BP:(!) 173/90  HR:(!) 52bpm  TEMP: ( )  RESP:  Physical Exam Vitals and nursing note reviewed.  Constitutional:      General: He is not in acute distress.    Appearance: Normal appearance. He is not ill-appearing.  HENT:     Head: Normocephalic and atraumatic.     Right Ear: External ear normal.     Left Ear: External ear normal.  Eyes:     Extraocular Movements: Extraocular movements intact.  Cardiovascular:     Rate and Rhythm: Normal rate.     Pulses: Normal pulses.  Abdominal:     General: There is no distension.     Palpations: Abdomen is soft.  Musculoskeletal:        General: No signs of injury.     Cervical back: Neck supple. Tenderness present. No rigidity.     Right lower leg: No edema.     Left lower leg: No edema.     Comments: Patient has good strength in the upper extremities with 5 out of 5 strength in wrist extension long finger flexion APB.  No intrinsic hand muscle atrophy.  Negative Hoffmann's test.  Lymphadenopathy:     Cervical: No cervical adenopathy.  Skin:  Findings: No erythema or rash.  Neurological:     General: No focal deficit present.     Mental Status: He is alert and oriented to person, place, and time.     Sensory: No sensory deficit.     Motor: No weakness or abnormal muscle tone.     Coordination: Coordination normal.  Psychiatric:        Mood and Affect: Mood normal.        Behavior: Behavior normal.      Imaging: XR C-ARM NO REPORT  Result Date: 05/28/2022 Please see Notes  tab for imaging impression.

## 2022-05-30 ENCOUNTER — Telehealth: Payer: Self-pay | Admitting: Pharmacist

## 2022-05-30 NOTE — Progress Notes (Signed)
Care Management & Coordination Services Pharmacy Team  Reason for Encounter: Appointment Reminder  Contacted patient to confirm telephone appointment with Leata Mouse, PharmD on 06/03/2022 at 10 am. Spoke with patient on 05/30/2022   Do you have any problems getting your medications? No If yes what types of problems are you experiencing?  None  What is your top health concern you would like to discuss at your upcoming visit? None  Have you seen any other providers since your last visit with PCP? Yes   Chart review:  Recent office visits:  05/19/2022 OV (PCP) Susy Frizzle, MD; recommended trying Voltaren gel . If the protein creatinine ratio is elevated greater than 200 consider adding [place].   03/31/2022 OV (PCP) Susy Frizzle, MD; no medication changes indicated.  12/20/2021 OV (PCP) Susy Frizzle, MD; Begin prednisone taper pack and use Z-Pak given the purulent sputum.   Recent consult visits:  05/27/2022 OV (Pain Med) Shelby Dubin, PA-C; no medication changes indicated.  05/21/2022 OV (Cardiology) Nahser, Wonda Cheng, MD; no medication changes indicated.  04/30/2022 OV (Orthopedics) Barnet Pall E, NP; no medication changes indicated.  03/04/2022 OV (Pain Med) Shelby Dubin, PA-C; no medication changes indicated.  12/10/2021 OV (Pain Med) Shelby Dubin, PA-C; no medication changes indicated.  Hospital visits:  02/21/2022 ED visit for Right hand pain -Recommends Tylenol [phone] mg evert 8 hours as needed for pain   Star Rating Drugs:  Losartan 100 mg last filled 03/04/2022 90 DS   Care Gaps: Annual wellness visit in last year? Yes   Future Appointments  Date Time Provider [place]  06/03/2022 10:00 AM Edythe Clarity, [place] [facility] None  06/13/2022 11:30 AM Barbaraann Faster, RN [facility] None  06/25/2022  8:15 AM Lorine Bears, NP [facility] None  05/12/2023  8:30 AM [facility]-NURSE HEALTH ADVISOR [facility]   April D Calhoun,  [place] Pharmacist Assistant [phone]

## 2022-06-02 ENCOUNTER — Other Ambulatory Visit: Payer: Self-pay | Admitting: *Deleted

## 2022-06-02 DIAGNOSIS — I10 Essential (primary) hypertension: Secondary | ICD-10-CM

## 2022-06-02 MED ORDER — AMLODIPINE BESYLATE 10 MG PO TABS: 10.0000 mg | ORAL_TABLET | Freq: Every day | ORAL | 1 refills | Status: DC

## 2022-06-03 ENCOUNTER — Ambulatory Visit: Payer: Medicare HMO | Admitting: Pharmacist

## 2022-06-03 NOTE — Progress Notes (Signed)
Care Management & Coordination Services Pharmacy Note  06/04/2022 Name:  [name] MRN:  [id number] DOB:  [birth year]  Summary: Initial visit with PharmD.  Patient doing well overall.  Controlling BP and LDL is very well controlled.  Appears to have COPD with no maintenance inhaler.  Discussion he noted that he used to but stopped due to price.  Mentions strained breathing when he lays down at night for a period of time and then it improved.  No sputum or SOB with ADLs.  Recommendations/Changes made from today's visit: Consider maintenance with LABA/LAMA if we can get it through a patient assistance program  Follow up plan: FU 6 months Adherence with inhaler if new start   Subjective: [name] is an 62 y.o. year old male who is a primary patient of Pickard, Cammie Mcgee, MD.  The care coordination team was consulted for assistance with disease management and care coordination needs.    Engaged with patient by telephone for initial visit.  Recent office visits:  05/19/2022 OV (PCP) Susy Frizzle, MD; recommended trying Voltaren gel . If the protein creatinine ratio is elevated greater than 200 consider adding [place].    03/31/2022 OV (PCP) Susy Frizzle, MD; no medication changes indicated.   12/20/2021 OV (PCP) Susy Frizzle, MD; Begin prednisone taper pack and use Z-Pak given the purulent sputum.    Recent consult visits:  05/27/2022 OV (Pain Med) Shelby Dubin, PA-C; no medication changes indicated.   05/21/2022 OV (Cardiology) Nahser, Wonda Cheng, MD; no medication changes indicated.   04/30/2022 [facility] (Orthopedics) Barnet Pall E, NP; no medication changes indicated.   03/04/2022 [facility] (Pain Med) Shelby Dubin, PA-C; no medication changes indicated.   12/10/2021 OV (Pain Med) Shelby Dubin, PA-C; no medication changes indicated.   Hospital visits:  02/21/2022 ED visit for Right hand pain -Recommends Tylenol [phone] mg evert 8 hours as needed for  pain   Objective:  Lab Results  Component Value Date   CREATININE 1.26 05/19/2022   BUN 16 05/19/2022   GFR 65.18 02/21/2014   EGFR 66 05/19/2022   GFRNONAA >60 04/15/2021   GFRAA 68 02/15/2020   NA 143 05/19/2022   K 4.4 05/19/2022   CALCIUM 9.4 05/19/2022   CO2 27 05/19/2022   GLUCOSE 120 (H) 05/19/2022    Lab Results  Component Value Date/Time   HGBA1C 5.9 (H) 02/15/2020 08:21 AM   HGBA1C 6.8 (H) 06/06/2019 09:11 AM   GFR 65.18 02/21/2014 08:22 AM   GFR 68.14 07/06/2012 10:51 AM    Last diabetic Eye exam: No results found for: "HMDIABEYEEXA"  Last diabetic Foot exam: No results found for: "HMDIABFOOTEX"   Lab Results  Component Value Date   CHOL 76 05/19/2022   HDL 40 05/19/2022   LDLCALC 20 05/19/2022   LDLDIRECT 117 (H) 02/23/2017   TRIG 81 05/19/2022   CHOLHDL 1.9 05/19/2022       Latest Ref Rng & Units 05/19/2022    8:20 AM 12/20/2021   12:12 PM 05/10/2021   10:42 AM  Hepatic Function  Total Protein 6.1 - 8.1 g/dL 6.9  7.0  7.1   Albumin 3.8 - 4.9 g/dL   4.5   AST 10 - 35 U/L 17  18  26   $ ALT 9 - 46 U/L 14  10  28   $ Alk Phosphatase 44 - 121 IU/L   91   Total Bilirubin 0.2 - 1.2 mg/dL 0.8  0.9  0.6  Bilirubin, Direct 0.00 - 0.40 mg/dL   0.16     Lab Results  Component Value Date/Time   TSH 2.540 06/03/2017 11:36 AM   TSH 1.204 11/04/2012 11:18 AM       Latest Ref Rng & Units 05/19/2022    8:20 AM 12/20/2021   12:12 PM 04/15/2021   10:29 AM  CBC  WBC 3.8 - 10.8 Thousand/uL 5.0  7.0  6.3   Hemoglobin 13.2 - 17.1 g/dL 14.1  14.5  14.8   Hematocrit 38.5 - 50.0 % 43.3  43.5  45.3   Platelets 140 - 400 Thousand/uL 178  187  176     No results found for: "VD25OH", "VITAMINB12"  Clinical ASCVD: Yes  The ASCVD Risk score (Arnett DK, et al., 2019) failed to calculate for the following reasons:   The patient has a prior MI or stroke diagnosis       05/19/2022    8:05 AM 05/15/2022   11:36 AM 05/08/2022    9:05 AM  Depression screen PHQ 2/9   Decreased Interest 0 0 0  Down, Depressed, Hopeless 0 0 0  PHQ - 2 Score 0 0 0     Social History   Tobacco Use  Smoking Status Former   Packs/day: 3.00   Years: 30.00   Total pack years: 90.00   Types: Cigarettes, Cigars   Quit date: 01/26/2009   Years since quitting: 13.3  Smokeless Tobacco Never   BP Readings from Last 3 Encounters:  05/28/22 (!) 173/90  05/21/22 (!) 150/80  05/19/22 124/82   Pulse Readings from Last 3 Encounters:  05/28/22 (!) 52  05/21/22 (!) 58  05/19/22 (!) 52   Wt Readings from Last 3 Encounters:  05/21/22 262 lb 9.6 oz (119.1 kg)  05/19/22 259 lb 3.2 oz (117.6 kg)  05/08/22 260 lb (117.9 kg)   BMI Readings from Last 3 Encounters:  05/21/22 38.78 kg/m  05/19/22 38.28 kg/m  05/08/22 38.40 kg/m    Allergies  Allergen Reactions   Rosuvastatin     Muscle aches. Other reaction(s): leg pain, Other Muscle aches. Other reaction(s): leg pain Other reaction(s): leg pain     Medications Reviewed Today     Reviewed by Edythe Clarity, [facility] (Pharmacist) on 06/04/22 at [place] Status: <None>   Medication Order Taking? Sig Documenting Provider Last Dose Status Informant  amLODipine (NORVASC) 10 MG tablet [id number] Yes Take 1 tablet (10 mg total) by mouth daily. Nahser, Wonda Cheng, MD Taking Active   aspirin EC 81 MG tablet [id number] Yes Take 1 tablet (81 mg total) by mouth every morning. Susy Frizzle, MD Taking Active Self  atorvastatin (LIPITOR) 80 MG tablet [id number] Yes Take 1 tablet (80 mg total) by mouth at bedtime. PT NEEDS OV FOR FURTHER REFILLS, NOT SEEN SINCE 2021 Susy Frizzle, MD Taking Active   clopidogrel (PLAVIX) 75 MG tablet [id number] Yes TAKE 1 TABLET(75 MG) BY MOUTH DAILY Nahser, Wonda Cheng, MD Taking Active   Evolocumab [facility] SURECLICK) [id number] MG/ML Darden Palmer [id number] Yes Inject 140 mg into the skin every 14 (fourteen) days. Nahser, Wonda Cheng, MD Taking Active   furosemide (LASIX) 20 MG tablet [id number] Yes TAKE 1  TABLET BY MOUTH 3 TIMES A WEEK AS NEEDED Nahser, Wonda Cheng, MD Taking Active   gabapentin (NEURONTIN) 800 MG tablet [id number] Yes Take 800 mg by mouth 3 (three) times daily. [provider] Taking Active Self  losartan (COZAAR) 100 MG tablet [id number] Yes Take 1  tablet (100 mg total) by mouth daily. Nahser, Wonda Cheng, MD Taking Active   oxyCODONE-acetaminophen (PERCOCET) 10-325 MG tablet [id number] Yes Take 1 tablet by mouth every 6 (six) hours as needed. [provider] Taking Active             SDOH:  (Social Determinants of Health) assessments and interventions performed: Yes Financial Resource Strain: Low Risk  (06/04/2022)   Overall Financial Resource Strain (CARDIA)    Difficulty of Paying Living Expenses: Not hard at all   Food Insecurity: No Food Insecurity (06/04/2022)   Hunger Vital Sign    Worried About Running Out of Food in the Last Year: Never true    Ran Out of Food in the Last Year: Never true    SDOH Interventions    [place] from 05/15/2022 in [place] from 05/08/2022 in [place] from 12/25/2021 in [place] Patient Outreach Telephone from 12/18/2021 in [place] from 05/02/2021 in [place] Interventions       Food Insecurity Interventions Intervention Not Indicated Intervention Not Indicated Intervention Not Indicated Intervention Not Indicated Intervention Not Indicated  Housing Interventions Intervention Not Indicated Intervention Not Indicated -- -- Intervention Not Indicated  Transportation Interventions Intervention Not Indicated Intervention Not Indicated Intervention Not Indicated Intervention Not Indicated Intervention Not Indicated  Utilities Interventions Intervention Not Indicated Intervention  Not Indicated -- -- --  Alcohol Usage Interventions -- Intervention Not Indicated (Score <7) -- -- --  Financial Strain Interventions -- Intervention Not Indicated Intervention Not Indicated -- Intervention Not Indicated  Physical Activity Interventions -- Intervention Not Indicated -- -- Intervention Not Indicated  Stress Interventions -- Intervention Not Indicated -- Other (Comment)  [care giver to disabled wife] Intervention Not Indicated  Social Connections Interventions -- Intervention Not Indicated -- -- Intervention Not Indicated       Medication Assistance: Application for inhaler  medication assistance program. in process.  Anticipated assistance start date unknown.  See plan of care for additional detail.  Medication Access: Within the past 30 days, how often has patient missed a dose of medication? 0 Is a pillbox or other method used to improve adherence? Yes  Factors that may affect medication adherence? financial need Are meds synced by current pharmacy? No  Are meds delivered by current pharmacy? No  Does patient experience delays in picking up medications due to transportation concerns? No   [facility] Reviewed: Is patient disadvantaged to use [facility]?: Yes  Current Rx insurance plan: [employer] Name and location of Current pharmacy:  [facility], [place] [address] [place] [address] Phone: [phone] Fax: [phone]  [facility] [address], [place] [address] Phone: [phone] Fax: [phone]  [id number] [id number]: [phone]: [phone] [facility] services reviewed with patient today?: Yes  Patient requests to transfer care to [facility]?: No  Reason patient declined  to change pharmacies: Loyalty to other pharmacy/Patient preference  Compliance/Adherence/Medication fill history: [facility] Drugs:  Losartan 100 mg last filled 03/04/2022 90 DS     Care Gaps: Annual wellness visit in  last year? Yes   Assessment/Plan   Hypertension (BP goal <130/80) -Controlled, based on home reported BP -Current treatment: Amlodipine 25m Appropriate, Effective, Safe, Accessible Losartan 1020mAppropriate, Effective, Safe, Accessible -Medications previously tried: none noted  -Current home readings: 120/80 -Denies hypotensive/hypertensive symptoms -Educated on BP goals and benefits of medications for prevention of heart attack, stroke and kidney damage; Daily salt intake goal < 2300 mg; Exercise goal of 150 minutes per week; Importance of home blood pressure monitoring; -Counseled to monitor BP at home as current, document, and provide log at future appointments -Recommended to continue current medication  Hyperlipidemia: (LDL goal < 70) -Controlled, LDL is controlled at 20 most recent screening -Current treatment: Repatha 14036mvery 14 days Appropriate, Effective, Safe, Accessible Atorvastatin 83m30mpropriate, Effective, Safe, Accessible -Medications previously tried: none ntoed  -Educated on Cholesterol goals;  Benefits of statin for ASCVD risk reduction; -Recommended to continue current medication Continue adherence with meds  COPD (Goal: control symptoms and prevent exacerbations) -Not ideally controlled -Current treatment  none -Medications previously tried: Spiriva, Daileresp  -Gold Grade: Gold 4 (FEV1<40%) -Current COPD Classification:  A (low sx, 0-1 moderate exacerbations, no hospitalizations) -Pulmonary function testing: Pulmonary Functions Testing Results:  No results found for: "FEV1", "FVC", "FEV1FVC", "TLC", "DLCO"  -Exacerbations requiring treatment in last 6 months: 1 - August 2023 -Patient denies consistent use of maintenance  inhaler -Frequency of rescue inhaler use: denies -Counseled on Proper inhaler technique; Benefits of consistent maintenance inhaler use When to use rescue inhaler He reports that he used to be on inhalers but believes he stopped due to cost.  He reports that when he lays down at night he has a really tight chest and has to breathe hard for a period of time before it improves.  Denies sputum production or SOB during ADLs. -Assessed patient finances. Will try to find LABA/LAMA that he can use as maintenance for patient with COPD and exacerbation within 6 months if we can.  He also could use rescue inhaler to have on hand for emergency situations.  Consult with PCP on plan.  Hx of MI (Goal: Reduce risk of recurrence) -Controlled -Current treatment  Clopidogrel 75mg47mropriate, Effective, Safe, Accessible -Medications previously tried: none noted -LDL well controlled and at goal for MI history  -Recommended to continue current medication Continue to monitor and screen for risk factors.  ChrisBeverly MilchrmD, CPP Clinical Pharmacist Practitioner [phone]

## 2022-06-05 ENCOUNTER — Other Ambulatory Visit: Payer: Self-pay

## 2022-06-05 DIAGNOSIS — Z79899 Other long term (current) drug therapy: Secondary | ICD-10-CM

## 2022-06-13 ENCOUNTER — Ambulatory Visit: Payer: Self-pay | Admitting: *Deleted

## 2022-06-13 NOTE — Patient Outreach (Signed)
  Care Coordination   Follow Up Visit Note   06/13/2022 Name: DONNAVAN BACINO MRN: [id number] DOB: 02/01/1961  XSAVIER MATUSIK is a 62 y.o. year old male who sees Pickard, Cammie Mcgee, MD for primary care. I spoke with  [name] by phone today.  What matters to the patients health and wellness today?  Not getting enough relief from his recent steroid injection. Return to see provider for neck on 06/17/22     Respiratory COPD worsening symptoms - Difficulty breathing Reports he continues to have a "cold"     Goals Addressed               This Visit's Progress     Patient Stated     manage COPD/upper respiratory symptoms (THN) (pt-stated)   Not on track     Care Coordination Interventions:UPDATE Pt will continue to work with pcp office pharmacy related to his inhalers. Assessed for CHF symptoms vs COPD symptoms - confirmed no swelling or unknown weight gain only shortness of breath Encouragement provided      Manage right  shoulder pain (THN) (pt-stated)   Not on track     Care Coordination Interventions:   Assessed pain after steroid injection  Encouragement provided Encouraged use OTC and prescribed medication until examined by his provider.         SDOH assessments and interventions completed:  No     Care Coordination Interventions:  Yes, provided   Follow up plan: Follow up call scheduled for 06/27/22    Encounter Outcome:  Pt. Visit Completed   Joydan Gretzinger L. Lavina Hamman, RN, BSN, [place] Coordinator Office number [phone]

## 2022-06-13 NOTE — Patient Outreach (Signed)
  Care Coordination   06/13/2022 Name: [name] MRN: [id number] DOB: May 12, 1960   Care Coordination Outreach Attempts:  An unsuccessful telephone outreach was attempted today to offer the patient information about available care coordination services as a benefit of their health plan.   Follow Up Plan:  Additional outreach attempts will be made to offer the patient care coordination information and services.   Encounter Outcome:  No Answer   Care Coordination Interventions:  No, not indicated    Cheryl Stabenow L. Lavina Hamman, RN, BSN, [place] Coordinator Office number [phone]

## 2022-06-13 NOTE — Patient Instructions (Signed)
Visit Information  Thank you for taking time to visit with me today. Please don't hesitate to contact me if I can be of assistance to you.   Following are the goals we discussed today:   Goals Addressed               This Visit's Progress     Patient Stated     manage COPD/upper respiratory symptoms (THN) (pt-stated)   Not on track     Care Coordination Interventions:UPDATE Pt will continue to work with pcp office pharmacy related to his inhalers. Assessed for CHF symptoms vs COPD symptoms - confirmed no swelling or unknown weight gain only shortness of breath Encouragement provided      Manage right  shoulder pain (THN) (pt-stated)   Not on track     Care Coordination Interventions:   Assessed pain after steroid injection  Encouragement provided Encouraged use OTC and prescribed medication until examined by his provider.         Our next appointment is by telephone on 06/27/22 at 1:45 pm  Please call the care guide team at [phone] if you need to cancel or reschedule your appointment.   If you are experiencing a Mental Health or [place] or need someone to talk to, please call the Suicide and Crisis Lifeline: 988 call the [place] National Suicide Prevention Lifeline: 864-231-2957 or TTY: (719) 299-9549 TTY (717) 737-9575) to talk to a trained counselor call 1-800-273-TALK (toll free, 24 hour hotline) call the [facility]: 609-649-7508 call 911   Patient verbalizes understanding of instructions and care plan provided today and agrees to view in [place]. Active MyChart status and patient understanding of how to access instructions and care plan via MyChart confirmed with patient.     The patient has been provided with contact information for the care management team and has been advised to call with any health related questions or concerns.    Jowell Bossi L. Lavina Hamman, RN, BSN, [place] Coordinator Office number [phone]

## 2022-06-25 ENCOUNTER — Ambulatory Visit: Payer: Medicare HMO | Admitting: Physical Medicine and Rehabilitation

## 2022-06-25 ENCOUNTER — Encounter: Payer: Self-pay | Admitting: Physical Medicine and Rehabilitation

## 2022-06-25 VITALS — BP 176/113 | HR 60

## 2022-06-25 DIAGNOSIS — M5412 Radiculopathy, cervical region: Secondary | ICD-10-CM | POA: Diagnosis not present

## 2022-06-25 DIAGNOSIS — M7918 Myalgia, other site: Secondary | ICD-10-CM | POA: Diagnosis not present

## 2022-06-25 DIAGNOSIS — G8929 Other chronic pain: Secondary | ICD-10-CM

## 2022-06-25 DIAGNOSIS — M898X1 Other specified disorders of bone, shoulder: Secondary | ICD-10-CM | POA: Diagnosis not present

## 2022-06-25 DIAGNOSIS — G894 Chronic pain syndrome: Secondary | ICD-10-CM | POA: Diagnosis not present

## 2022-06-25 NOTE — Progress Notes (Signed)
Said helped 2 weeks perfectly. Started to slowly return two days ago. Stinging pain down right arm for two days. Taking percocet for pain

## 2022-06-25 NOTE — Progress Notes (Signed)
[name] - 62 y.o. male MRN [id number]  Date of birth: 09/04/1960  Office Visit Note: Visit Date: 06/25/2022 PCP: Susy Frizzle, MD Referred by: Susy Frizzle, MD  Subjective: Chief Complaint  Patient presents with   Right Shoulder - Pain   Right Arm - Pain   HPI: TYLEN BAAB is a 62 y.o. male who comes in today for chronic, worsening and severe right sided posterior shoulder pain radiating down right arm to hand. Pain ongoing for several months, worsens with movement and activity. He describes pain as sore, aching and burning/tingling sensation down right arm, currently rates as 9 out of 10. He has tried home exercise regimen, rest and medications. No relief of pain with conservative therapies. History of formal physical therapy in [place] where he did undergo dry needling, no relief of pain with these treatments. He is currently being treated for chronic pain by Dr. Red Christians at [facility], he is currently taking Gabapentin 800 mg TID and 10-325 mg Percocet every 6 hours. He reports minimal relief of pain with medications. Cervical MRI imaging from 2023 exhibits multilevel cervical spondylosis with resultant mild spinal stenosis at C4-C5 and C6-C7. There is mild bilateral foraminal narrowing at C4, moderate on the left and mild on the right at C5 and moderate bilaterally at C7. No frank nerve impingement. No high grade spinal canal stenosis. Patient has undergone myofascial trigger point injections with Dr. Eduard Roux and in our office with no relief of pain. History of multiple cervical epidural steroid injections at [place] and in our office with significant relief of pain for about 1 week. History of surgical consult with Dr. Consuella Lose in March of 2023 at [facility], per his notes did not recommend any surgical intervention at this time. Patient underwent InterStim placement in 2013 with Dr. Alona Bene due to issues  with nocturia and fecal incontinence, however this device has since been removed as he did not feel it was beneficial in alleviating his symptoms. Overall, patient is frustrated and feels interventions are not beneficial in alleviating his pain. His was initially evaluated by Dr. Larena Glassman and Dr. Eduard Roux, per their notes his issue was thought to be more cervical related. No history of right shoulder injection. Patient denies focal weakness, numbness and tingling. No recent trauma or falls.   Patients course is complicated by coronary artery disease, MI, peripheral vascular disease, and COPD. He is currently on long term anticoagulant therapy with Plavix.      Review of Systems  Musculoskeletal:  Positive for joint pain, myalgias and neck pain.  Neurological:  Positive for tingling. Negative for focal weakness and weakness.  All other systems reviewed and are negative.  Otherwise per HPI.  Assessment & Plan: Visit Diagnoses:    ICD-10-CM   1. Chronic scapular pain  M89.8X1 AMB referral to sports medicine   G89.29     2. Radiculopathy, cervical region  M54.12 AMB referral to sports medicine    3. Myofascial pain  M79.18 AMB referral to sports medicine    4. Chronic pain syndrome  G89.4 AMB referral to sports medicine       Plan: Findings:  Chronic, worsening and severe right sided posterior shoulder pain radiating down right arm to hand. Patient continues to have severe pain despite good conservative therapies such as formal physical therapy, home exercise regimen, rest and use of medications. Patients clinical presentation and exam are complex, differentials include cervical  radiculopathy, myofascial pain syndrome and intrinsic right shoulder problem. No relief with previous myofascial trigger point injections, significant short term relief of pain with previous cervical epidural steroid injections. Severe pain noted with right shoulder range of motion today. We would like to refer  patient to our partner Dr. Elba Barman for further evaluation and right intra-articular shoulder injection. If good relief of pain with shoulder injection we would refer back to orthopedics for management. If his pain persists post injection we recommend following up with Viviano Simas, PA at [facility] to discuss chronic right posterior shoulder pain radiating down right arm. He could also consult with our spine surgeon Dr. Ileene Rubens to discuss options. Re-grouping with our in house physical therapy team for dry needling is also option. We touch base with patient after right shoulder injection. No red flag symptoms noted upon exam today.       Meds & Orders: No orders of the defined types were placed in this encounter.   Orders Placed This Encounter  Procedures   AMB referral to sports medicine    Follow-up: Return for Right intra-articular shoulder injection with Dr. Rolena Infante.   Procedures: No procedures performed      Clinical History: EXAM: MRI CERVICAL SPINE WITHOUT CONTRAST   TECHNIQUE: Multiplanar, multisequence MR imaging of the cervical spine was performed. No intravenous contrast was administered.   COMPARISON:  Prior CT from 06/28/2021.   FINDINGS: Alignment: Straightening of the normal cervical lordosis. Trace degenerative retrolisthesis of C4 on C5.   Vertebrae: Vertebral body height maintained without acute or chronic fracture. Bone marrow signal intensity within normal limits. No discrete or worrisome osseous lesions. Minor discogenic reactive endplate changes present about the C6-7 interspace. No other abnormal marrow edema.   Cord: Normal signal and morphology.   Posterior Fossa, vertebral arteries, paraspinal tissues: Patchy signal abnormality noted involving the pons, most characteristic of chronic microvascular ischemic disease. Craniocervical junction normal. Paraspinous soft tissues within normal limits. Normal flow voids seen within  the vertebral arteries bilaterally.   Disc levels:   C2-C3: Unremarkable.   C3-C4: Mild disc bulge with bilateral uncovertebral spurring. Flattening of the ventral thecal sac without significant spinal stenosis. Mild bilateral C4 foraminal stenosis.   C4-C5: Disc bulge with bilateral uncovertebral spurring, greater on the left. Flattening and partial effacement of the ventral thecal sac with resultant mild spinal stenosis. No frank cord impingement. Moderate left with mild right C5 foraminal narrowing.   C5-C6: Disc bulge with mild uncovertebral spurring. Mild left-sided facet hypertrophy. No spinal stenosis. Mild left C6 foraminal narrowing. Right neural foramina is patent.   C6-C7: Mild degenerative intervertebral disc space narrowing with diffuse disc osteophyte complex. Flattening and partial effacement of the ventral thecal sac with resultant mild spinal stenosis. Moderate bilateral C7 foraminal narrowing.   C7-T1: Minimal annular disc bulge. Left-sided facet hypertrophy. No spinal stenosis. Foramina remain patent.   IMPRESSION: 1. Multilevel cervical spondylosis with resultant mild spinal stenosis at C4-5 and C6-7. 2. Multifactorial degenerative changes with resultant multilevel foraminal narrowing as above. Notable findings include mild bilateral C4 foraminal stenosis, moderate left with mild right C5 foraminal narrowing, with moderate bilateral C7 foraminal stenosis.     Electronically Signed   By: Jeannine Boga M.D.   On: 04/21/2022 02:13   He reports that he quit smoking about 13 years ago. His smoking use included cigarettes and cigars. He has a 90.00 pack-year smoking history. He has never used smokeless tobacco. No results for input(s): "HGBA1C", "LABURIC"  in the last 8760 hours.  Objective:  VS:  HT:    WT:   BMI:     BP:(!) 176/113  HR:60bpm  TEMP: ( )  RESP:  Physical Exam Vitals and nursing note reviewed.  HENT:     Head: Normocephalic and  atraumatic.     Right Ear: External ear normal.     Left Ear: External ear normal.     Nose: Nose normal.     Mouth/Throat:     Mouth: Mucous membranes are moist.  Eyes:     Extraocular Movements: Extraocular movements intact.  Cardiovascular:     Rate and Rhythm: Normal rate.     Pulses: Normal pulses.  Pulmonary:     Effort: Pulmonary effort is normal.  Abdominal:     General: Abdomen is flat. There is no distension.  Musculoskeletal:        General: Tenderness present.     Cervical back: Normal range of motion.     Comments: No discomfort noted with flexion, extension and side-to-side rotation. Patient has good strength in the upper extremities including 5 out of 5 strength in wrist extension, long finger flexion and APB.  There is no atrophy of the hands intrinsically.  Sensation intact bilaterally. Pain noted with right shoulder range of motion.   Skin:    General: Skin is warm and dry.     Capillary Refill: Capillary refill takes less than 2 seconds.  Neurological:     General: No focal deficit present.     Mental Status: He is alert and oriented to person, place, and time.  Psychiatric:        Mood and Affect: Mood normal.        Behavior: Behavior normal.     Ortho Exam  Imaging: No results found.  Past Medical/Family/Surgical/Social History: Medications & Allergies reviewed per EMR, new medications updated. 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   Mixed hyperlipidemia 02/23/2017   Peripheral vascular disease ([place]) 06/01/2016   Nasolacrimal duct obstruction, acquired, right 03/11/2016   Medication side effects 06/28/2014   Chronic obstructive  pulmonary disease ([place]) 05/24/2013   Coronary artery disease 06/14/2012   Chronic, continuous use of opioids 06/14/2012   Bowel incontinence 0000000   Umbilical hernia [id number]   Increased frequency of urination 12/11/2011   Prostatitis 11/13/2011   Atherosclerosis of native arteries of the extremities with intermittent claudication 10/03/2011   Atherosclerosis of native artery of extremity with intermittent claudication ([place]) 10/03/2011   Fecal smearing 09/26/2011   Acquired hindfoot varus 08/12/2011   Instability of ankle joint 08/12/2011   Painful sternal wire 06/30/2011   Encounter for therapeutic drug monitoring 04/06/2011   Sural neuritis 04/06/2011   Ankle pain 01/31/2011   Peripheral neuropathy 01/31/2011   Chronic right shoulder pain 01/30/2011   Leg cramps 11/12/2010   Dyspnea    Orthostatic hypotension    History of syncope    Past Medical History:  Diagnosis Date   Acute MI, inferior wall ([place]) 04/2009   Chest pain    COPD (chronic obstructive pulmonary disease) ([place])    Coronary artery disease    STATUS POST CABG 01/2009   Dyslipidemia    Hyperlipidemia  Hypertension    Orthostatic hypotension    Peripheral vascular disease ([facility])    Syncope and collapse    Family History  Problem Relation Age of Onset   Coronary artery disease Father 57   Diabetes Father    Hyperlipidemia Father    Heart disease Father        before age 53   Hypertension Father    Cancer Mother        uterine   Hyperlipidemia Mother    Diabetes Mother    Hypertension Mother    Hypertension Brother    Past Surgical History:  Procedure Laterality Date   ANKLE SURGERY     x 2   COLONOSCOPY  April 2013   CORONARY ANGIOPLASTY     CORONARY ARTERY BYPASS GRAFT  2010   FOOT SURGERY     HERNIA REPAIR  01/23/12   umb hernia   Kidney stimulator     LEFT HEART CATH AND CORS/GRAFTS ANGIOGRAPHY N/A 06/02/2016   Procedure: Left Heart Cath and Cors/Grafts Angiography;  Surgeon: Lorretta Harp, MD;  Location: [facility];  Service: Cardiovascular;  Laterality: N/A;   LEFT HEART CATHETERIZATION WITH CORONARY/GRAFT ANGIOGRAM N/A 07/08/2012   Procedure: LEFT HEART CATHETERIZATION WITH Beatrix Fetters;  Surgeon: Thayer Headings, MD;  Location: [facility];  Service: Cardiovascular;  Laterality: N/A;   PR VEIN BYPASS GRAFT,AORTO-FEM-POP     STERNAL WIRES REMOVAL  07/08/2011   Procedure: STERNAL WIRES REMOVAL;  Surgeon: Grace Isaac, MD;  Location: [facility];  Service: Open Heart Surgery;  Laterality: N/A;  removal of 4th wire   Social History   Occupational History   Occupation: Disability  Tobacco Use   Smoking status: Former    Packs/day: 3.00    Years: 30.00    Total pack years: 90.00    Types: Cigarettes, Cigars    Quit date: 01/26/2009    Years since quitting: 13.4   Smokeless tobacco: Never  Vaping Use   Vaping Use: Never used  Substance and Sexual Activity   Alcohol use: No   Drug use: No   Sexual activity: Not Currently

## 2022-06-26 ENCOUNTER — Encounter: Payer: Self-pay | Admitting: Radiology

## 2022-06-27 ENCOUNTER — Ambulatory Visit: Payer: Self-pay | Admitting: *Deleted

## 2022-06-27 NOTE — Patient Outreach (Signed)
  Care Coordination   Follow Up Visit Note   08/16/2022 Name: [name] MRN: [id number] DOB: 10-14-1960  [name] is a 62 y.o. year old male who sees Pickard, Priscille Heidelberg, MD for primary care. I spoke with  [name] by phone today.  What matters to the patients health and wellness today?  Follow up recent ortho office visit/future treatment Follow up on pending labs for spouse     Goals Addressed               This Visit's Progress     Patient Stated     improve kidney functioning labs [name]) (pt-stated)   On track     Care Coordination Interventions: UPDATE Pt reports a pending appointment on 1/22 with [name] for ongoing labs to check in any improvement with previous changes other then stop Ibuprofen and started on Tylenol. Will follow up next month with any new results or changes.  Assessed SHOD barriers with no issues reported.       manage COPD/upper respiratory symptoms ([facility]N) (pt-stated)   On track     Care Coordination Interventions:UPDATE improvement      Manage right  shoulder pain ([facility]N) (pt-stated)   On track     Care Coordination Interventions:   Assessed pain after steroid injection  Encouragement provided Encouraged use OTC and prescribed medication until examined by his provider. Interventions Today    Flowsheet Row Most Recent Value  Chronic Disease   Chronic disease during today's visit Other  [chronic pain of neck and Right intra-articular shoulder]  General Interventions   General Interventions Discussed/Reviewed Communication with  Labs --  [pending Right intra-articular shoulder injection with [name].]  Doctor Visits Discussed/Reviewed Specialist, Doctor Visits Discussed  [reviewed his recent ortho visit on 06/25/22 and pending Right intra-articular shoulder injection with [name]. Unable to find an scheduled appointment for this injection in EPIC on 06/27/22]  Communication with --  [Call not answered at [facility] [phone]]              SDOH assessments and interventions completed:  No     Care Coordination Interventions:  Yes, provided   Follow up plan: Follow up call scheduled for 07/31/22    Encounter Outcome:  Pt. Visit Completed   Allen Howser L. Noelle Penner, RN, BSN, CCM [facility] Care Management Community Coordinator Office number [phone]

## 2022-07-31 ENCOUNTER — Ambulatory Visit: Payer: Self-pay | Admitting: *Deleted

## 2022-07-31 NOTE — Patient Instructions (Addendum)
 Visit Information  Thank you for taking time to visit with me today. Please don't hesitate to contact me if I can be of assistance to you.   Following are the goals we discussed today:   Goals Addressed               This Visit's Progress     Patient Stated     Manage right  shoulder pain (THN) (pt-stated)   Not on track     Care Coordination Interventions:   Assessed pain after steroid injection  Encouragement provided Encouraged use OTC and prescribed medication until examined by his provider. Interventions Today    Flowsheet Row Most Recent Value  Chronic Disease   Chronic disease during today's visit Other, Chronic Obstructive Pulmonary Disease (COPD), Hypertension (HTN), Chronic Kidney Disease/End Stage Renal Disease (ESRD)  General Interventions   General Interventions Discussed/Reviewed General Interventions Reviewed, Doctor Visits  Doctor Visits Discussed/Reviewed Doctor Visits Reviewed, PCP, Specialist  PCP/Specialist Visits Compliance with follow-up visit  Mental Health Interventions   Mental Health Discussed/Reviewed Mental Health Discussed, Coping Strategies  Pharmacy Interventions   Pharmacy Dicussed/Reviewed Pharmacy Topics Discussed, Medications and their functions, Affording Medications  Safety Interventions   Safety Discussed/Reviewed Safety Discussed, Home Safety  Home Safety Assistive Devices              Our next appointment is by telephone on 08/21/22 at 1100  Please call the care guide team at [phone] if you need to cancel or reschedule your appointment.   If you are experiencing a Mental Health or Behavioral Health Crisis or need someone to talk to, please call the Suicide and Crisis Lifeline: 988 call the [place] National Suicide Prevention Lifeline: 9728306821 or TTY: 213 499 5798 TTY (618)383-0752) to talk to a trained counselor call 1-800-273-TALK (toll free, 24 hour hotline) call the [facility]:  [phone] call 911   Patient verbalizes understanding of instructions and care plan provided today and agrees to view in MyChart. Active MyChart status and patient understanding of how to access instructions and care plan via MyChart confirmed with patient.     The patient has been provided with contact information for the care management team and has been advised to call with any health related questions or concerns.   Elfrida Pixley L. Noelle Penner, RN, BSN, CCM [facility] Care Management Community Coordinator Office number [phone]

## 2022-07-31 NOTE — Patient Outreach (Signed)
  Care Coordination   Follow Up Visit Note   07/10/2023 late entry update for 07/31/22 Name: [name] MRN: [id number] DOB: 04/02/1961  [name] is a 62 y.o. year old male who sees Pickard, Priscille Heidelberg, MD for primary care. I spoke with  [name] by phone today.  What matters to the patients health and wellness today?  Still pending ortho care - chronic neck & right shoulder pain   Goals Addressed               This Visit's Progress     Patient Stated     Manage right  shoulder pain (THN) (pt-stated)   Not on track     Care Coordination Interventions:   Assessed pain after steroid injection  Encouragement provided Encouraged use OTC and prescribed medication until examined by his provider. Interventions Today    Flowsheet Row Most Recent Value  Chronic Disease   Chronic disease during today's visit Other, Chronic Obstructive Pulmonary Disease (COPD), Hypertension (HTN), Chronic Kidney Disease/End Stage Renal Disease (ESRD)  General Interventions   General Interventions Discussed/Reviewed General Interventions Reviewed, Doctor Visits  Doctor Visits Discussed/Reviewed Doctor Visits Reviewed, PCP, Specialist  PCP/Specialist Visits Compliance with follow-up visit  Mental Health Interventions   Mental Health Discussed/Reviewed Mental Health Discussed, Coping Strategies  Pharmacy Interventions   Pharmacy Dicussed/Reviewed Pharmacy Topics Discussed, Medications and their functions, Affording Medications  Safety Interventions   Safety Discussed/Reviewed Safety Discussed, Home Safety  Home Safety Assistive Devices              SDOH assessments and interventions completed:  No     Care Coordination Interventions:  Yes, provided   Follow up plan: Follow up call scheduled for 08/21/22    Encounter Outcome:  Patient Visit Completed   Cala Bradford L. Noelle Penner, RN, BSN, CCM [place]  [facility] RN Care Manager Direct Dial:  [phone]  Fax: [phone]

## 2022-08-06 ENCOUNTER — Other Ambulatory Visit: Payer: Self-pay

## 2022-08-06 MED ORDER — CLOPIDOGREL BISULFATE 75 MG PO TABS: ORAL_TABLET | ORAL | 2 refills | Status: DC

## 2022-08-16 NOTE — Patient Instructions (Signed)
Visit Information  Thank you for taking time to visit with me today. Please don't hesitate to contact me if I can be of assistance to you.   Following are the goals we discussed today:   Goals Addressed               This Visit's Progress     Patient Stated     improve kidney functioning labs [facility]) (pt-stated)   On track     Care Coordination Interventions: UPDATE Pt reports a pending appointment on 1/22 with Dr. Tanya Nones for ongoing labs to check in any improvement with previous changes other then stop Ibuprofen and started on Tylenol. Will follow up next month with any new results or changes.  Assessed SHOD barriers with no issues reported.       manage COPD/upper respiratory symptoms ([facility]) (pt-stated)   On track     Care Coordination Interventions:UPDATE improvement      Manage right  shoulder pain ([facility]N) (pt-stated)   On track     Care Coordination Interventions:   Assessed pain after steroid injection  Encouragement provided Encouraged use OTC and prescribed medication until examined by his provider. Interventions Today    Flowsheet Row Most Recent Value  Chronic Disease   Chronic disease during today's visit Other  [chronic pain of neck and Right intra-articular shoulder]  General Interventions   General Interventions Discussed/Reviewed Communication with  Labs --  [pending Right intra-articular shoulder injection with Dr. Brooks.]  Doctor Visits Discussed/Reviewed Specialist, Doctor Visits Discussed  [reviewed his recent ortho visit on 06/25/22 and pending Right intra-articular shoulder injection with Dr. Shon Baton. Unable to find an scheduled appointment for this injection in EPIC on 06/27/22]  Communication with --  [Call not answered at [facility] [phone]]             Our next appointment is by telephone on 07/31/22 at 1130  Please call the care guide team at [phone] if you need to cancel or reschedule your appointment.   If you are experiencing a Mental  Health or Behavioral Health Crisis or need someone to talk to, please call the Suicide and Crisis Lifeline: 988 call the [place] National Suicide Prevention Lifeline: 250-293-8138 or TTY: 505-710-9194 TTY (725)150-5969) to talk to a trained counselor call 1-800-273-TALK (toll free, 24 hour hotline) call the [facility]: [phone] call 911   Patient verbalizes understanding of instructions and care plan provided today and agrees to view in MyChart. Active MyChart status and patient understanding of how to access instructions and care plan via MyChart confirmed with patient.     The patient has been provided with contact information for the care management team and has been advised to call with any health related questions or concerns.   Abria Vannostrand L. Noelle Penner, RN, BSN, CCM [facility] Care Management Community Coordinator Office number [phone]

## 2022-08-19 DIAGNOSIS — M79672 Pain in left foot: Secondary | ICD-10-CM | POA: Diagnosis not present

## 2022-08-19 DIAGNOSIS — M25572 Pain in left ankle and joints of left foot: Secondary | ICD-10-CM | POA: Diagnosis not present

## 2022-08-21 ENCOUNTER — Ambulatory Visit: Payer: Self-pay | Admitting: *Deleted

## 2022-08-21 NOTE — Patient Outreach (Signed)
  Care Coordination   Follow Up Visit Note   08/21/2022 Name: [name] MRN: [id number] DOB: Mar 12, 1961  [name] is a 62 y.o. year old male who sees Pickard, Priscille Heidelberg, MD for primary care. I spoke with  [name] by phone today.  What matters to the patients health and wellness today?  [name] shares that the Hospice SW is visiting [name], spouse, today has mentioned placement to [name] and [name] is not pleased  He and RN CM discussed a return call    Goals Addressed   None     SDOH assessments and interventions completed:  No     Care Coordination Interventions:  Yes, provided   Follow up plan: Follow up call scheduled for pending    Encounter Outcome:  Pt. Visit Completed   Allen Kauzlarich L. Noelle Penner, RN, BSN, CCM [facility] Care Management Community Coordinator Office number [phone]

## 2022-09-11 ENCOUNTER — Other Ambulatory Visit: Payer: Self-pay

## 2022-09-11 ENCOUNTER — Ambulatory Visit (INDEPENDENT_AMBULATORY_CARE_PROVIDER_SITE_OTHER): Payer: Medicare HMO

## 2022-09-11 ENCOUNTER — Encounter: Payer: Self-pay | Admitting: Emergency Medicine

## 2022-09-11 ENCOUNTER — Telehealth: Payer: Self-pay | Admitting: Pharmacist

## 2022-09-11 ENCOUNTER — Ambulatory Visit
Admission: EM | Admit: 2022-09-11 | Discharge: 2022-09-11 | Disposition: A | Discharge status: Home / Self Care | Payer: Medicare HMO | Attending: Nurse Practitioner | Admitting: Nurse Practitioner

## 2022-09-11 ENCOUNTER — Inpatient Hospital Stay (HOSPITAL_COMMUNITY)
Admission: EM | Admit: 2022-09-11 | Discharge: 2022-09-15 | DRG: 315 | Disposition: A | Discharge status: Home / Self Care | Payer: Medicare HMO | Attending: Family Medicine | Admitting: Family Medicine

## 2022-09-11 ENCOUNTER — Emergency Department (HOSPITAL_COMMUNITY): Payer: Medicare HMO

## 2022-09-11 DIAGNOSIS — Z83438 Family history of other disorder of lipoprotein metabolism and other lipidemia: Secondary | ICD-10-CM

## 2022-09-11 DIAGNOSIS — D61818 Other pancytopenia: Secondary | ICD-10-CM | POA: Diagnosis present

## 2022-09-11 DIAGNOSIS — E785 Hyperlipidemia, unspecified: Secondary | ICD-10-CM | POA: Diagnosis not present

## 2022-09-11 DIAGNOSIS — R079 Chest pain, unspecified: Secondary | ICD-10-CM

## 2022-09-11 DIAGNOSIS — R509 Fever, unspecified: Secondary | ICD-10-CM | POA: Diagnosis present

## 2022-09-11 DIAGNOSIS — Z7902 Long term (current) use of antithrombotics/antiplatelets: Secondary | ICD-10-CM

## 2022-09-11 DIAGNOSIS — Z8673 Personal history of transient ischemic attack (TIA), and cerebral infarction without residual deficits: Secondary | ICD-10-CM | POA: Diagnosis not present

## 2022-09-11 DIAGNOSIS — Z7982 Long term (current) use of aspirin: Secondary | ICD-10-CM | POA: Diagnosis not present

## 2022-09-11 DIAGNOSIS — M79605 Pain in left leg: Secondary | ICD-10-CM | POA: Diagnosis not present

## 2022-09-11 DIAGNOSIS — R519 Headache, unspecified: Secondary | ICD-10-CM | POA: Diagnosis not present

## 2022-09-11 DIAGNOSIS — I739 Peripheral vascular disease, unspecified: Secondary | ICD-10-CM | POA: Diagnosis present

## 2022-09-11 DIAGNOSIS — I25708 Atherosclerosis of coronary artery bypass graft(s), unspecified, with other forms of angina pectoris: Secondary | ICD-10-CM

## 2022-09-11 DIAGNOSIS — R7989 Other specified abnormal findings of blood chemistry: Secondary | ICD-10-CM | POA: Diagnosis present

## 2022-09-11 DIAGNOSIS — Z951 Presence of aortocoronary bypass graft: Secondary | ICD-10-CM | POA: Diagnosis not present

## 2022-09-11 DIAGNOSIS — Z888 Allergy status to other drugs, medicaments and biological substances status: Secondary | ICD-10-CM

## 2022-09-11 DIAGNOSIS — J449 Chronic obstructive pulmonary disease, unspecified: Secondary | ICD-10-CM | POA: Diagnosis present

## 2022-09-11 DIAGNOSIS — Z1152 Encounter for screening for COVID-19: Secondary | ICD-10-CM

## 2022-09-11 DIAGNOSIS — I25709 Atherosclerosis of coronary artery bypass graft(s), unspecified, with unspecified angina pectoris: Secondary | ICD-10-CM | POA: Diagnosis not present

## 2022-09-11 DIAGNOSIS — E669 Obesity, unspecified: Secondary | ICD-10-CM | POA: Diagnosis present

## 2022-09-11 DIAGNOSIS — I252 Old myocardial infarction: Secondary | ICD-10-CM | POA: Diagnosis not present

## 2022-09-11 DIAGNOSIS — I10 Essential (primary) hypertension: Secondary | ICD-10-CM | POA: Diagnosis present

## 2022-09-11 DIAGNOSIS — R0682 Tachypnea, not elsewhere classified: Secondary | ICD-10-CM

## 2022-09-11 DIAGNOSIS — I400 Infective myocarditis: Principal | ICD-10-CM | POA: Diagnosis present

## 2022-09-11 DIAGNOSIS — I25810 Atherosclerosis of coronary artery bypass graft(s) without angina pectoris: Secondary | ICD-10-CM | POA: Diagnosis not present

## 2022-09-11 DIAGNOSIS — Z87891 Personal history of nicotine dependence: Secondary | ICD-10-CM

## 2022-09-11 DIAGNOSIS — Z955 Presence of coronary angioplasty implant and graft: Secondary | ICD-10-CM

## 2022-09-11 DIAGNOSIS — W57XXXA Bitten or stung by nonvenomous insect and other nonvenomous arthropods, initial encounter: Secondary | ICD-10-CM | POA: Diagnosis present

## 2022-09-11 DIAGNOSIS — M79604 Pain in right leg: Secondary | ICD-10-CM | POA: Diagnosis not present

## 2022-09-11 DIAGNOSIS — R0789 Other chest pain: Secondary | ICD-10-CM | POA: Diagnosis not present

## 2022-09-11 DIAGNOSIS — Z833 Family history of diabetes mellitus: Secondary | ICD-10-CM | POA: Diagnosis not present

## 2022-09-11 DIAGNOSIS — I2510 Atherosclerotic heart disease of native coronary artery without angina pectoris: Secondary | ICD-10-CM | POA: Diagnosis not present

## 2022-09-11 DIAGNOSIS — Z79899 Other long term (current) drug therapy: Secondary | ICD-10-CM

## 2022-09-11 DIAGNOSIS — R0602 Shortness of breath: Secondary | ICD-10-CM

## 2022-09-11 DIAGNOSIS — S30860A Insect bite (nonvenomous) of lower back and pelvis, initial encounter: Secondary | ICD-10-CM | POA: Diagnosis not present

## 2022-09-11 DIAGNOSIS — J438 Other emphysema: Secondary | ICD-10-CM

## 2022-09-11 DIAGNOSIS — N179 Acute kidney failure, unspecified: Secondary | ICD-10-CM | POA: Diagnosis not present

## 2022-09-11 DIAGNOSIS — Z6838 Body mass index (BMI) 38.0-38.9, adult: Secondary | ICD-10-CM

## 2022-09-11 DIAGNOSIS — Z8249 Family history of ischemic heart disease and other diseases of the circulatory system: Secondary | ICD-10-CM | POA: Diagnosis not present

## 2022-09-11 DIAGNOSIS — I493 Ventricular premature depolarization: Secondary | ICD-10-CM | POA: Diagnosis present

## 2022-09-11 DIAGNOSIS — Z95820 Peripheral vascular angioplasty status with implants and grafts: Secondary | ICD-10-CM

## 2022-09-11 DIAGNOSIS — R059 Cough, unspecified: Secondary | ICD-10-CM | POA: Diagnosis not present

## 2022-09-11 DIAGNOSIS — R008 Other abnormalities of heart beat: Secondary | ICD-10-CM | POA: Diagnosis present

## 2022-09-11 DIAGNOSIS — I951 Orthostatic hypotension: Secondary | ICD-10-CM | POA: Diagnosis present

## 2022-09-11 LAB — URINALYSIS, ROUTINE W REFLEX MICROSCOPIC
Bilirubin Urine: NEGATIVE
Glucose, UA: NEGATIVE mg/dL
Hgb urine dipstick: NEGATIVE
Ketones, ur: NEGATIVE mg/dL
Leukocytes,Ua: NEGATIVE
Nitrite: NEGATIVE
Protein, ur: 100 mg/dL — AB
Specific Gravity, Urine: 1.013 (ref 1.005–1.030)
pH: 5 (ref 5.0–8.0)

## 2022-09-11 LAB — LACTIC ACID, PLASMA
Lactic Acid, Venous: 1.9 mmol/L (ref 0.5–1.9)
Lactic Acid, Venous: 1 mmol/L (ref 0.5–1.9)

## 2022-09-11 LAB — RESP PANEL BY RT-PCR (RSV, FLU A&B, COVID)  RVPGX2
Influenza A by PCR: NEGATIVE
Influenza B by PCR: NEGATIVE
Resp Syncytial Virus by PCR: NEGATIVE
SARS Coronavirus 2 by RT PCR: NEGATIVE

## 2022-09-11 LAB — CBC
HCT: 42.5 % (ref 39.0–52.0)
Hemoglobin: 13.9 g/dL (ref 13.0–17.0)
MCH: 27.7 pg (ref 26.0–34.0)
MCHC: 32.7 g/dL (ref 30.0–36.0)
MCV: 84.7 fL (ref 80.0–100.0)
Platelets: 128 10*3/uL — ABNORMAL LOW (ref 150–400)
RBC: 5.02 MIL/uL (ref 4.22–5.81)
RDW: 13.2 % (ref 11.5–15.5)
WBC: 5.6 10*3/uL (ref 4.0–10.5)
nRBC: 0 % (ref 0.0–0.2)

## 2022-09-11 LAB — COMPREHENSIVE METABOLIC PANEL
ALT: 21 U/L (ref 0–44)
AST: 29 U/L (ref 15–41)
Albumin: 3.9 g/dL (ref 3.5–5.0)
Alkaline Phosphatase: 76 U/L (ref 38–126)
Anion gap: 11 (ref 5–15)
BUN: 23 mg/dL — ABNORMAL HIGH (ref 6–20)
CO2: 22 mmol/L (ref 22–32)
Calcium: 8.4 mg/dL — ABNORMAL LOW (ref 8.9–10.3)
Chloride: 102 mmol/L (ref 98–111)
Creatinine, Ser: 2.1 mg/dL — ABNORMAL HIGH (ref 0.61–1.24)
GFR, Estimated: 35 mL/min — ABNORMAL LOW (ref >60)
Glucose, Bld: 154 mg/dL — ABNORMAL HIGH (ref 70–99)
Potassium: 3.5 mmol/L (ref 3.5–5.1)
Sodium: 135 mmol/L (ref 135–145)
Total Bilirubin: 1 mg/dL (ref 0.3–1.2)
Total Protein: 6.8 g/dL (ref 6.5–8.1)

## 2022-09-11 LAB — CULTURE, BLOOD (ROUTINE X 2)

## 2022-09-11 LAB — SEDIMENTATION RATE: Sed Rate: 3 mm/hr (ref 0–16)

## 2022-09-11 LAB — PROTIME-INR
INR: 1.1 (ref 0.8–1.2)
Prothrombin Time: 14 seconds (ref 11.4–15.2)

## 2022-09-11 LAB — APTT: aPTT: 29 seconds (ref 24–36)

## 2022-09-11 LAB — PROCALCITONIN: Procalcitonin: 0.78 ng/mL

## 2022-09-11 LAB — CK: Total CK: 98 U/L (ref 49–397)

## 2022-09-11 LAB — TROPONIN I (HIGH SENSITIVITY)
Troponin I (High Sensitivity): 29 ng/L — ABNORMAL HIGH (ref <18)
Troponin I (High Sensitivity): 30 ng/L — ABNORMAL HIGH (ref <18)

## 2022-09-11 MED ORDER — HEPARIN SODIUM (PORCINE) 5000 UNIT/ML IJ SOLN
5000.0000 [IU] | Freq: Three times a day (TID) | INTRAMUSCULAR | Status: DC
  Administered 2022-09-11 – 2022-09-14 (×8): 5000 [IU] via SUBCUTANEOUS
  Filled 2022-09-11 (×8): qty 1

## 2022-09-11 MED ORDER — MORPHINE SULFATE (PF) 2 MG/ML IV SOLN
2.0000 mg | INTRAVENOUS | Status: DC | PRN
  Administered 2022-09-11: 2 mg via INTRAVENOUS
  Filled 2022-09-11: qty 1

## 2022-09-11 MED ORDER — SODIUM CHLORIDE 0.9 % IV SOLN
2.0000 g | Freq: Two times a day (BID) | INTRAVENOUS | Status: DC
  Administered 2022-09-11 – 2022-09-14 (×6): 2 g via INTRAVENOUS
  Filled 2022-09-11 (×6): qty 12.5

## 2022-09-11 MED ORDER — ACETAMINOPHEN 325 MG PO TABS
ORAL_TABLET | ORAL | Status: AC
  Filled 2022-09-11: qty 2

## 2022-09-11 MED ORDER — VANCOMYCIN HCL 2000 MG/400ML IV SOLN
2000.0000 mg | Freq: Once | INTRAVENOUS | Status: AC
  Administered 2022-09-11: 2000 mg via INTRAVENOUS
  Filled 2022-09-11 (×2): qty 400

## 2022-09-11 MED ORDER — NITROGLYCERIN 0.4 MG SL SUBL: 0.4000 mg | SUBLINGUAL_TABLET | SUBLINGUAL | Status: DC | PRN

## 2022-09-11 MED ORDER — LACTATED RINGERS IV SOLN: INTRAVENOUS | Status: DC

## 2022-09-11 MED ORDER — GABAPENTIN 400 MG PO CAPS: 800.0000 mg | ORAL_CAPSULE | Freq: Three times a day (TID) | ORAL | Status: DC

## 2022-09-11 MED ORDER — METRONIDAZOLE 500 MG/100ML IV SOLN
500.0000 mg | Freq: Two times a day (BID) | INTRAVENOUS | Status: DC
  Administered 2022-09-11 – 2022-09-14 (×6): 500 mg via INTRAVENOUS
  Filled 2022-09-11 (×6): qty 100

## 2022-09-11 MED ORDER — SODIUM CHLORIDE 0.9 % IV SOLN
1.0000 g | Freq: Once | INTRAVENOUS | Status: AC
  Administered 2022-09-11: 1 g via INTRAVENOUS
  Filled 2022-09-11: qty 10

## 2022-09-11 MED ORDER — ATORVASTATIN CALCIUM 40 MG PO TABS
80.0000 mg | ORAL_TABLET | Freq: Every day | ORAL | Status: DC
  Administered 2022-09-11 – 2022-09-14 (×4): 80 mg via ORAL
  Filled 2022-09-11 (×4): qty 2

## 2022-09-11 MED ORDER — SODIUM CHLORIDE 0.9 % IV SOLN: INTRAVENOUS | Status: AC

## 2022-09-11 MED ORDER — CLOPIDOGREL BISULFATE 75 MG PO TABS
75.0000 mg | ORAL_TABLET | Freq: Every day | ORAL | Status: DC
  Administered 2022-09-12 – 2022-09-15 (×4): 75 mg via ORAL
  Filled 2022-09-11 (×4): qty 1

## 2022-09-11 MED ORDER — ACETAMINOPHEN 325 MG PO TABS
650.0000 mg | ORAL_TABLET | Freq: Once | ORAL | Status: AC | PRN
  Administered 2022-09-11: 650 mg via ORAL
  Filled 2022-09-11: qty 2

## 2022-09-11 MED ORDER — POTASSIUM CHLORIDE CRYS ER 20 MEQ PO TBCR
40.0000 meq | EXTENDED_RELEASE_TABLET | Freq: Once | ORAL | Status: AC
  Administered 2022-09-11: 40 meq via ORAL
  Filled 2022-09-11: qty 2

## 2022-09-11 MED ORDER — VANCOMYCIN HCL 1250 MG/250ML IV SOLN
1250.0000 mg | INTRAVENOUS | Status: DC
  Administered 2022-09-12 – 2022-09-14 (×3): 1250 mg via INTRAVENOUS
  Filled 2022-09-11 (×3): qty 250

## 2022-09-11 MED ORDER — IPRATROPIUM-ALBUTEROL 0.5-2.5 (3) MG/3ML IN SOLN
3.0000 mL | RESPIRATORY_TRACT | Status: DC | PRN
  Administered 2022-09-13: 3 mL via RESPIRATORY_TRACT
  Filled 2022-09-11: qty 3

## 2022-09-11 MED ORDER — ASPIRIN 81 MG PO TBEC
81.0000 mg | DELAYED_RELEASE_TABLET | Freq: Every day | ORAL | Status: DC
  Administered 2022-09-12 – 2022-09-15 (×4): 81 mg via ORAL
  Filled 2022-09-11 (×4): qty 1

## 2022-09-11 MED ORDER — GABAPENTIN 400 MG PO CAPS
800.0000 mg | ORAL_CAPSULE | Freq: Three times a day (TID) | ORAL | Status: DC
  Administered 2022-09-12 – 2022-09-15 (×10): 800 mg via ORAL
  Filled 2022-09-11 (×10): qty 2

## 2022-09-11 MED ORDER — AMLODIPINE BESYLATE 5 MG PO TABS
10.0000 mg | ORAL_TABLET | Freq: Every day | ORAL | Status: DC
  Administered 2022-09-12 – 2022-09-15 (×4): 10 mg via ORAL
  Filled 2022-09-11 (×4): qty 2

## 2022-09-11 NOTE — Assessment & Plan Note (Signed)
Cr elevated 2.1, baseline 1.2-1.5. -Hold Lasix 20 mg daily, losartan 100 mg daily - N/s 100cc/hr x 20hrs

## 2022-09-11 NOTE — Assessment & Plan Note (Addendum)
Febrile 101.8. Presenting with chest pain and dyspnea.  No focus of infection identified.  Chest x-ray clear, UA-rare bacteria, not convincing as etiology of fever.  Barely meeting SIRS criteria with tachypnea respiratory 20-22.  Otherwise WBC 5.6.  Heart rate 43-77, Lactic acid 1.9 > 1.  Also with AKI.  -Check procalcitonin- Elevated at 0.78 -Follow-up blood cultures -Add on urine cultures -Broad-spectrum antibiotics IV vancomycin cefepime and metronidazole

## 2022-09-11 NOTE — Progress Notes (Signed)
Pharmacy Antibiotic Note  [name] is a 62 y.o. male admitted on 09/11/2022 with sepsis.  Pharmacy has been consulted for vancomycin and cefepime dosing.  Plan: Vancomycin 2000 mg IV x 1 dose. Vancomycin 1250 mg IV every 24 hours. Cefepime 2000 mg IV every 12 hours. Monitor labs, c/s, and vanco levels as indicated.  Height: 5\' 9"  (175.3 cm) Weight: 118.2 kg (260 lb 9.6 oz) IBW/kg (Calculated) : 70.7  Temp (24hrs), Avg:99.1 F (37.3 C), Min:97.8 F (36.6 C), Max:101.8 F (38.8 C)  Recent Labs  Lab 09/11/22 1614 09/11/22 1804  WBC 5.6  --   CREATININE 2.10*  --   LATICACIDVEN 1.9 1.0    Estimated Creatinine Clearance: 47.5 mL/min (A) (by C-G formula based on SCr of 2.1 mg/dL (H)).    Allergies  Allergen Reactions   Rosuvastatin     Muscle aches. Other reaction(s): leg pain, Other Muscle aches. Other reaction(s): leg pain Other reaction(s): leg pain     Antimicrobials this admission: Vanco 5/16 >> Cefepime 5/16 >> Flagyl 5/16 CTX 5/16  Microbiology results: 5/16 BCx: pending   Thank you for allowing pharmacy to be a part of this patient's care.  Tad Moore 09/11/2022 9:53 PM

## 2022-09-11 NOTE — ED Triage Notes (Signed)
Pt presents after being sent by [facility] for "EKG don't look good"  Pt states he "can't breathe"  Pt is alert and oriented, speaking in full sentences in no distress at time of triage.  Pt states he went to [facility] for not being able to breathe and also CP.  Was told by his insurance he should always go to [facility] first before the ED.  Pt is febrile in triage.

## 2022-09-11 NOTE — Assessment & Plan Note (Signed)
Stable. -Resume Norvasc, -Hold losartan with AKI

## 2022-09-11 NOTE — ED Provider Notes (Signed)
[facility]    CSN: [id number] Arrival date & time: 09/11/22  1255      History   Chief Complaint No chief complaint on file.   HPI [name] is a 62 y.o. male.   Patient presents today for shortness of breath, fatigue, and bilateral lower extremity pain.  Reports stymptoms started 2 days ago.  He was taking his trash out last night and got so tired he had to sit down in the middle of his driveway.  Has also had a headache.  No cough, congestion, fever, body aches.  No abdominal pain, nausea/vomiting, sore throat.  No known fevers, however he has felt cold today since he got to urgent care.  No chest pain.  Medical history significant for history of inferior MI, hypertension, cervical radiculopathy, peripheral vascular disease, COPD.  He does not have any medications for COPD.        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   Mixed hyperlipidemia 02/23/2017   Peripheral vascular disease (HCC) 06/01/2016   Nasolacrimal duct obstruction, acquired, right 03/11/2016   Medication side effects 06/28/2014   Coronary artery disease 06/14/2012   COPD (chronic obstructive pulmonary disease) (HCC) 06/14/2012   Chronic, continuous use of opioids 06/14/2012   Fecal incontinence 05/18/2012   Umbilical hernia  01/08/2012   Increased frequency of urination 12/11/2011   Prostatitis 11/13/2011   Atherosclerosis of native arteries of the extremities with intermittent claudication 10/03/2011   Atherosclerosis of native artery of extremity with intermittent claudication (HCC) 10/03/2011   Fecal smearing 09/26/2011   Acquired hindfoot varus 08/12/2011   Instability of ankle joint 08/12/2011   Painful sternal wire 06/30/2011   Encounter for therapeutic drug monitoring 04/06/2011   Sural neuritis 04/06/2011   Foot pain 01/31/2011   Peripheral neuropathy 01/31/2011   Chronic pain syndrome 01/30/2011   Leg cramps 11/12/2010   Dyspnea    Orthostatic hypotension    History of syncope     Past Surgical History:  Procedure Laterality Date   ANKLE SURGERY     x 2   COLONOSCOPY  April 2013   CORONARY ANGIOPLASTY     CORONARY ARTERY BYPASS GRAFT  2010   FOOT SURGERY     HERNIA REPAIR  01/23/12   umb hernia   Kidney stimulator     LEFT HEART CATH AND CORS/GRAFTS ANGIOGRAPHY N/A 06/02/2016   Procedure: Left Heart Cath and Cors/Grafts Angiography;  Surgeon: Runell Gess, MD;  Location: [facility]ASIVE [facility] LAB;  Service: Cardiovascular;  Laterality: N/A;   LEFT HEART CATHETERIZATION WITH CORONARY/GRAFT ANGIOGRAM N/A 07/08/2012   Procedure: LEFT HEART CATHETERIZATION WITH Isabel Caprice;  Surgeon: Vesta Mixer, MD;  Location: [facility] CATH LAB;  Service: Cardiovascular;  Laterality: N/A;   PR VEIN BYPASS GRAFT,AORTO-FEM-POP  STERNAL WIRES REMOVAL  07/08/2011   Procedure: STERNAL WIRES REMOVAL;  Surgeon: [name], MD;  Location: [facility] OR;  Service: Open Heart Surgery;  Laterality: N/A;  removal of 4th wire       Home Medications    Prior to Admission medications   Medication Sig Start Date End Date Taking? Authorizing Provider  amLODipine (NORVASC) 10 MG tablet Take 1 tablet (10 mg total) by mouth daily. 06/02/22   Nahser, Deloris Ping, MD  aspirin EC 81 MG tablet Take 1 tablet (81 mg total) by  mouth every morning. 11/07/16   [name], MD  atorvastatin (LIPITOR) 80 MG tablet Take 1 tablet (80 mg total) by mouth at bedtime. PT NEEDS OV FOR FURTHER REFILLS, NOT SEEN SINCE 2021 06/18/21   [name], MD  clopidogrel (PLAVIX) 75 MG tablet TAKE 1 TABLET(75 MG) BY MOUTH DAILY 08/06/22   Nahser, Deloris Ping, MD  Evolocumab (REPATHA SURECLICK) 140 MG/ML SOAJ Inject 140 mg into the skin every 14 (fourteen) days. 12/03/21   Nahser, Deloris Ping, MD  furosemide (LASIX) 20 MG tablet TAKE 1 TABLET BY MOUTH 3 TIMES A WEEK AS NEEDED 07/12/21   Nahser, Deloris Ping, MD  gabapentin (NEURONTIN) 800 MG tablet Take 800 mg by mouth 3 (three) times daily. 03/22/21   [provider]  losartan (COZAAR) 100 MG tablet Take 1 tablet (100 mg total) by mouth daily. 04/10/22   Nahser, Deloris Ping, MD  oxyCODONE-acetaminophen (PERCOCET) 10-325 MG tablet Take 1 tablet by mouth every 6 (six) hours as needed. 04/25/21   [provider]    Family History Family History  Problem Relation Age of Onset   Coronary artery disease Father 41   Diabetes Father    Hyperlipidemia Father    Heart disease Father        before age 32   Hypertension Father    Cancer Mother        uterine   Hyperlipidemia Mother    Diabetes Mother    Hypertension Mother    Hypertension Brother     Social History Social History   Tobacco Use   Smoking status: Former    Packs/day: 3.00    Years: 30.00    Additional pack years: 0.00    Total pack years: 90.00    Types: Cigarettes, Cigars    Quit date: 01/26/2009    Years since quitting: 13.6   Smokeless tobacco: Never  Vaping Use   Vaping Use: Never used  Substance Use Topics   Alcohol use: No   Drug use: No     Allergies   Rosuvastatin   Review of Systems Review of Systems Per HPI  Physical Exam Triage Vital Signs ED Triage Vitals  Enc Vitals Group     BP 09/11/22 1311 (!) 164/91     Pulse Rate 09/11/22 1311 77     Resp 09/11/22 1311 (!) 22     Temp  09/11/22 1311 97.8 F (36.6 C)     Temp Source 09/11/22 1311 Oral     SpO2 09/11/22 1311 96 %     Weight --      Height --      Head Circumference --      Peak Flow --      Pain Score 09/11/22 1312 0     Pain Loc --      Pain Edu? --      Excl. in GC? --    No data found.  Updated Vital Signs BP (!) 164/91 (BP Location: Right Arm)   Pulse 77   Temp 97.8 F (36.6 C) (Oral)   Resp (!) 22   SpO2 96%   Visual Acuity Right Eye Distance:   Left Eye Distance:   Bilateral Distance:    Right Eye Near:   Left Eye Near:    Bilateral Near:     Physical Exam Vitals and nursing note reviewed.  Constitutional:      General: He is not in acute distress.    Appearance: Normal appearance. He is not ill-appearing, toxic-appearing or diaphoretic.  HENT:     Head: Normocephalic and atraumatic.     Mouth/Throat:     Mouth: Mucous membranes are moist.     Pharynx: Oropharynx is clear. No oropharyngeal exudate or posterior oropharyngeal erythema.  Cardiovascular:     Rate and Rhythm: Normal rate and regular rhythm.  Pulmonary:     Effort: Pulmonary effort is normal. Tachypnea present. No respiratory distress.     Breath sounds: Normal breath sounds. No wheezing, rhonchi or rales.  Musculoskeletal:     Cervical back: Normal range of motion.  Lymphadenopathy:     Cervical: No cervical adenopathy.  Skin:    General: Skin is warm and dry.     Capillary Refill: Capillary refill takes less than 2 seconds.     Coloration: Skin is not jaundiced or pale.     Findings: No erythema.  Neurological:     Mental Status: He is alert and oriented to person, place, and time.  Psychiatric:        Behavior: Behavior is cooperative.      UC Treatments / Results  Labs (all labs ordered are listed, but only abnormal results are displayed) Labs Reviewed - No data to display  EKG   Radiology DG Chest 2 View  Result Date: 09/11/2022 CLINICAL DATA:  Cough, shortness of breath EXAM: CHEST - 2  VIEW COMPARISON:  Previous studies including chest radiographs done on 05/31/2016 FINDINGS: Transit of diameter of heart is increased. There is previous coronary bypass surgery. There are no signs of pulmonary edema or new focal infiltrates. There is no pleural effusion or pneumothorax. IMPRESSION: No active cardiopulmonary disease. Electronically Signed   By: Ernie Avena M.D.   On: 09/11/2022 13:54    Procedures Procedures (including critical care time)  Medications Ordered in UC Medications - No data to display  Initial Impression / Assessment and Plan / UC Course  I have reviewed the triage vital signs and the nursing notes.  Pertinent labs & imaging results that were available during my care of the patient were reviewed by me and considered in my medical decision making (see chart for details).   Patient is well-appearing, afebrile, not tachycardic, oxygenating well on room air.  Patient is hypertensive and tachypneic in triage today.  1. Shortness of breath 2. Tachypnea 3. Coronary artery disease involving coronary bypass graft of native heart with other forms of angina pectoris (HCC) Chest x-ray today is negative for acute cardiopulmonary abnormality Lungs are clear to auscultation bilaterally, however patient is tachypneic even with rest/sitting down for more than 10 minutes EKG today shows sinus rhythm with occasional PVCs; there is a bit of artifact today and appears to be some changes in leads aVF, V2 from previous EKG 4 months ago I recommended further evaluation and management in emergency room recommended transport via ambulance, however patient declines Patient states he will call his brother to give him a  ride I recommended going straight to the emergency room from urgent care today Patient states he has to go check on his wife first who has dementia and is bedbound I recommended he call another family member to check on his wife and recommended he go straight to the  hospital Patient is in agreement to plan Patient left urgent care in stable condition  The patient was given the opportunity to ask questions.  All questions answered to their satisfaction.  The patient is in agreement to this plan.    Final Clinical Impressions(s) / UC Diagnoses   Final diagnoses:  Shortness of breath  Tachypnea  Coronary artery disease involving coronary bypass graft of native heart with other forms of angina pectoris [facility])     Discharge Instructions      I am concerned about your heart today.  Please go straight to the hospital for further evaluation and management of the shortness of breath.    ED Prescriptions   None    PDMP not reviewed this encounter.   Valentino Nose, NP 09/11/22 1446

## 2022-09-11 NOTE — ED Triage Notes (Signed)
States he has been falling asleep during the day and that he feels like he is SOB.  States his legs hurt.  Symptoms started 3 days ago.  States headache x 2 days.  States when he started to walk, it feels like he is going to fall because his legs hurt so bad.

## 2022-09-11 NOTE — H&P (Signed)
History and Physical    [name] QMV:[id number] DOB: March 16, 1961 DOA: 09/11/2022  PCP: [name], MD   Patient coming from: Home   I have personally briefly reviewed patient's old medical records in [facility]  Chief Complaint: SOB, chest pain  HPI: [name] is a 62 y.o. male with medical history significant for COPD, coronary artery disease with CABG in 2010, hypertension, peripheral vascular disease status post bilateral stent placement. Patient presented to the ED with multiple complaints of headache, difficulty breathing, left-sided chest pain, bilateral lower extremity pain, present sleepiness. Symptoms all started about 2 days ago.  Reports chest pain started while he was walking to his car garage, he describes sharp chest pain that radiates down his left arm, and has persisted since onset.  No aggravating or relieving symptoms.  Chest pain is similar to chest pain that resulted in CABG in 2010.  He has had persistent bilateral lower extremity pain from his knee downwards, but while he was walking, but has persistent despite resting.  No redness, no erythema or discoloration, no ulcers.  Reports stents to his bilateral lower extremities, and pain at that time was similar to pain today. No abdominal pain, no urinary symptoms, no vomiting or diarrhea, no cough.  Had routine dental work today, symptoms started 2 days ago.  ED Course: Febrile to 101.8.  Heart rate 43-77.  Respiratory 20-22.  Blood pressure systolic 132-159.  Lactic acid 1.9 > 1.  Troponin 29> 30.  EKG shows frequent PVCs, rate 87, QTc 428.  Creatinine elevated 2.1.  UA with rare bacteria.  Chest x-ray clear. LR 150 cc/h started.  IV ceftriaxone 1 g given for possible bacteremia. EDP talked to the cardiology Dr. Royann Shivers due to concerns for possible acute myocarditis as etiology for fever. Recommended admission, echo, cardiology team to see in consult  Review of Systems: As per HPI all other systems  reviewed and negative.  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   Chest pain    COPD (chronic obstructive pulmonary disease) (HCC)    Coronary artery disease    STATUS POST CABG 01/2009   Dyslipidemia    Hyperlipidemia    Hypertension    Orthostatic hypotension    Peripheral vascular disease (HCC)    Syncope and collapse     Past Surgical History:  Procedure Laterality Date   ANKLE SURGERY     x 2   COLONOSCOPY  April 2013   CORONARY ANGIOPLASTY     CORONARY ARTERY BYPASS GRAFT  2010   FOOT SURGERY     HERNIA REPAIR  01/23/12   umb hernia   Kidney stimulator     LEFT HEART CATH AND CORS/GRAFTS ANGIOGRAPHY N/A 06/02/2016   Procedure: Left Heart Cath and Cors/Grafts Angiography;  Surgeon: Runell Gess, MD;  Location: [facility]VAS[facility] LAB;  Service: Cardiovascular;  Laterality: N/A;   LEFT HEART CATHETERIZATION WITH CORONARY/GRAFT ANGIOGRAM N/A 07/08/2012   Procedure: LEFT HEART CATHETERIZATION WITH Isabel Caprice;  Surgeon: Vesta Mixer, MD;  Location: [facility] CATH LAB;  Service: Cardiovascular;  Laterality: N/A;   PR VEIN BYPASS GRAFT,AORTO-FEM-POP     STERNAL WIRES REMOVAL  07/08/2011   Procedure: STERNAL WIRES REMOVAL;  Surgeon: [name], MD;  Location: [facility] OR;  Service: Open Heart Surgery;  Laterality: N/A;  removal of 4th wire     reports that he quit smoking about 13 years ago. His smoking use included  cigarettes and cigars. He has a 90.00 pack-year smoking history. He has never used smokeless tobacco. He reports that he does not drink alcohol and does not use drugs.  Allergies  Allergen Reactions   Rosuvastatin     Muscle aches. Other reaction(s): leg pain, Other Muscle aches. Other reaction(s): leg pain Other reaction(s): leg pain     Family History  Problem Relation Age of Onset   Coronary artery disease Father 45   Diabetes Father    Hyperlipidemia Father    Heart disease Father        before age 66    Hypertension Father    Cancer Mother        uterine   Hyperlipidemia Mother    Diabetes Mother    Hypertension Mother    Hypertension Brother     Prior to Admission medications   Medication Sig Start Date End Date Taking? Authorizing Provider  amLODipine (NORVASC) 10 MG tablet Take 1 tablet (10 mg total) by mouth daily. 06/02/22  Yes Nahser, Deloris Ping, MD  aspirin EC 81 MG tablet Take 1 tablet (81 mg total) by mouth every morning. 11/07/16  Yes [name], MD  atorvastatin (LIPITOR) 80 MG tablet Take 1 tablet (80 mg total) by mouth at bedtime. PT NEEDS OV FOR FURTHER REFILLS, NOT SEEN SINCE 2021 06/18/21  Yes [name], MD  clopidogrel (PLAVIX) 75 MG tablet TAKE 1 TABLET(75 MG) BY MOUTH DAILY 08/06/22  Yes Nahser, Deloris Ping, MD  Evolocumab (REPATHA SURECLICK) 140 MG/ML SOAJ Inject 140 mg into the skin every 14 (fourteen) days. 12/03/21  Yes Nahser, Deloris Ping, MD  furosemide (LASIX) 20 MG tablet TAKE 1 TABLET BY MOUTH 3 TIMES A WEEK AS NEEDED 07/12/21  Yes Nahser, Deloris Ping, MD  gabapentin (NEURONTIN) 800 MG tablet Take 800 mg by mouth 3 (three) times daily. 03/22/21  Yes [provider]  losartan (COZAAR) 100 MG tablet Take 1 tablet (100 mg total) by mouth daily. 04/10/22  Yes Nahser, Deloris Ping, MD  oxyCODONE-acetaminophen (PERCOCET) 10-325 MG tablet Take 1 tablet by mouth every 6 (six) hours as needed. 04/25/21  Yes [provider]    Physical Exam: Vitals:   09/11/22 1735 09/11/22 1800 09/11/22 1801 09/11/22 1802  BP: (!) 159/83 (!) 157/89    Pulse: 61 (!) 57    Resp: 20   (!) 21  Temp: 99.5 F (37.5 C)     TempSrc: Oral     SpO2: 97%  96%     Constitutional: NAD, calm, comfortable Vitals:   09/11/22 1735 09/11/22 1800 09/11/22 1801 09/11/22 1802  BP: (!) 159/83 (!) 157/89    Pulse: 61 (!) 57    Resp: 20   (!) 21  Temp: 99.5 F (37.5 C)     TempSrc: Oral     SpO2: 97%  96%    Eyes: PERRL, lids and conjunctivae normal ENMT: Mucous membranes are  moist.  Neck: normal, supple, no masses, no thyromegaly Respiratory: clear to auscultation bilaterally, no wheezing, no crackles. Normal respiratory effort. No accessory muscle use.  Cardiovascular: Regular rate and rhythm, no murmurs / rubs / gallops. No extremity edema.  Extremities warm, no differential warmth, no erythema, no tenderness to palpation.   Abdomen: no tenderness, no masses palpated. No hepatosplenomegaly. Bowel sounds positive.  Musculoskeletal: no clubbing / cyanosis. No joint deformity upper and lower extremities.  Skin: no rashes, lesions, ulcers. No induration Neurologic: No facial asymmetry, speech fluent, moving extremities spontaneously. Psychiatric: Normal  judgment and insight. Alert and oriented x 3. Normal mood.   Labs on Admission: I have personally reviewed following labs and imaging studies  CBC: Recent Labs  Lab 09/11/22 1614  WBC 5.6  HGB 13.9  HCT 42.5  MCV 84.7  PLT 128*   Basic Metabolic Panel: Recent Labs  Lab 09/11/22 1614  NA 135  K 3.5  CL 102  CO2 22  GLUCOSE 154*  BUN 23*  CREATININE 2.10*  CALCIUM 8.4*   GFR: CrCl cannot be calculated (Unknown ideal weight.). Liver Function Tests: Recent Labs  Lab 09/11/22 1614  AST 29  ALT 21  ALKPHOS 76  BILITOT 1.0  PROT 6.8  ALBUMIN 3.9   Coagulation Profile: Recent Labs  Lab 09/11/22 1614  INR 1.1   Cardiac Enzymes: Recent Labs  Lab 09/11/22 1614  CKTOTAL 98   Urine analysis:    Component Value Date/Time   COLORURINE YELLOW 09/11/2022 1718   APPEARANCEUR CLEAR 09/11/2022 1718   LABSPEC 1.013 09/11/2022 1718   PHURINE 5.0 09/11/2022 1718   GLUCOSEU NEGATIVE 09/11/2022 1718   HGBUR NEGATIVE 09/11/2022 1718   BILIRUBINUR NEGATIVE 09/11/2022 1718   KETONESUR NEGATIVE 09/11/2022 1718   PROTEINUR 100 (A) 09/11/2022 1718   UROBILINOGEN 0.2 11/27/2011 0023   NITRITE NEGATIVE 09/11/2022 1718   LEUKOCYTESUR NEGATIVE 09/11/2022 1718    Radiological Exams on Admission: DG  Chest 2 View  Result Date: 09/11/2022 CLINICAL DATA:  Cough, shortness of breath EXAM: CHEST - 2 VIEW COMPARISON:  Previous studies including chest radiographs done on 05/31/2016 FINDINGS: Transit of diameter of heart is increased. There is previous coronary bypass surgery. There are no signs of pulmonary edema or new focal infiltrates. There is no pleural effusion or pneumothorax. IMPRESSION: No active cardiopulmonary disease. Electronically Signed   By: Ernie Avena M.D.   On: 09/11/2022 13:54    EKG: Independently reviewed.  Sinus rhythm, rate 87, frequent PVCs, QTc 428.  Diffuse T wave abnormalities- unchanged from prior.  Assessment/Plan Principal Problem:   Fever of unknown origin Active Problems:   Chest pain   AKI (acute kidney injury) (HCC)   Coronary artery disease   Essential hypertension   COPD (chronic obstructive pulmonary disease) (HCC)   Peripheral vascular disease (HCC)   Assessment and Plan: * Fever of unknown origin Febrile 101.8. Presenting with chest pain and dyspnea.  No focus of infection identified.  Chest x-ray clear, UA-rare bacteria, not convincing as etiology of fever.  Barely meeting SIRS criteria with tachypnea respiratory 20-22.  Otherwise WBC 5.6.  Heart rate 43-77, Lactic acid 1.9 > 1.  Also with AKI.  -Check procalcitonin- Elevated at 0.78 -Follow-up blood cultures -Add on urine cultures -Broad-spectrum antibiotics IV vancomycin cefepime and metronidazole  AKI (acute kidney injury) (HCC) Cr elevated 2.1, baseline 1.2-1.5. -Hold Lasix 20 mg daily, losartan 100 mg daily - N/s 100cc/hr x 20hrs  Chest pain Chest pain + dyspnea with typical and atypical features.  In the setting of fever.  Troponin 29 > 30.  EKG showing very frequent PVCs.  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  Follows with cardiology. - EDP concern for possible acute myocarditis, talked to cardiologist Dr. Royann Shivers- Echo, cardiology team to  see in a.m. -Check ESR, CRP - NPO midnight -Reports compliance with aspirin and Plavix, resume  -Resume statins -IV morphine 2 mg every 4 hours as needed, nitro as needed -Hold off on starting beta-blocker, heart rate 43-77  Peripheral vascular  disease (HCC) Reports persistent bilateral lower extremity pain.  No suggestion of infection.  Status post iliac stenting.  May be neuropathy, he is on gabapentin. -Bilateral arterial Dopplers - Resume Gabapentin  COPD (chronic obstructive pulmonary disease) (HCC) Stable. -DuoNebs as needed  Essential hypertension Stable. -Resume Norvasc, -Hold losartan with AKI   DVT prophylaxis: Lovenox Code Status:  FULL Family Communication: None at bedside Disposition Plan: > 2 days Consults called: Cardiology Admission status: Inpt TELE I certify that at the point of admission it is my clinical judgment that the patient will require inpatient hospital care spanning beyond 2 midnights from the point of admission due to high intensity of service, high risk for further deterioration and high frequency of surveillance required.    Author: Onnie Boer, MD 09/11/2022 9:42 PM  For on call review [email or web address].

## 2022-09-11 NOTE — Sepsis Progress Note (Signed)
Code Sepsis protocol being monitored by e[employer]. 

## 2022-09-11 NOTE — Assessment & Plan Note (Addendum)
Chest pain + dyspnea with typical and atypical features.  In the setting of fever.  Troponin 29 > 30.  EKG showing very frequent PVCs.  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  Follows with cardiology. - EDP concern for possible acute myocarditis, talked to cardiologist Dr. Royann Shivers- Echo, cardiology team to see in a.m. -Check ESR, CRP - NPO midnight -Reports compliance with aspirin and Plavix, resume  -Resume statins -IV morphine 2 mg every 4 hours as needed, nitro as needed -Hold off on starting beta-blocker, heart rate 43-77

## 2022-09-11 NOTE — Assessment & Plan Note (Addendum)
Reports persistent bilateral lower extremity pain.  No suggestion of infection.  Status post iliac stenting.  May be neuropathy, he is on gabapentin. -Bilateral arterial Dopplers - Resume Gabapentin

## 2022-09-11 NOTE — Progress Notes (Signed)
Care Management & Coordination Services Pharmacy Team  Reason for Encounter: General adherence update   Contacted patient for general health update and medication adherence call.  Spoke with patient on 09/16/2022    What concerns do you have about your medications?  The patient denies side effects with their medications.   How often do you forget or accidentally miss a dose? Never  Do you use a pillbox? Yes  Are you having any problems getting your medications from your pharmacy? No  Has the cost of your medications been a concern? No If yes, what medication and is patient assistance available or has it been applied for?  Since last visit with PharmD, no interventions have been made.   The patient has had an ED visit since last contact.   The patient reports the following problems with their health. Patient states his breathing has not been doing well. Patient states he would like to start using an inhaler.  Counseled patient on: Access to carecoordination team for any cost, medication or pharmacy concerns.   Chart Updates:  Recent office visits:  None  Recent consult visits:  08/19/2022 OV (Pain Med) Arman Filter, PA-C; no medication changes indicated.  06/25/2022 OV (Orthopedics) Ellin Goodie E, NP; no medication changes indicated.  Hospital visits:  None in previous 6 months  Medications: Outpatient Encounter Medications as of 09/11/2022  Medication Sig   amLODipine (NORVASC) 10 MG tablet Take 1 tablet (10 mg total) by mouth daily.   aspirin EC 81 MG tablet Take 1 tablet (81 mg total) by mouth every morning.   atorvastatin (LIPITOR) 80 MG tablet Take 1 tablet (80 mg total) by mouth at bedtime. PT NEEDS OV FOR FURTHER REFILLS, NOT SEEN SINCE 2021   clopidogrel (PLAVIX) 75 MG tablet TAKE 1 TABLET(75 MG) BY MOUTH DAILY   Evolocumab (REPATHA SURECLICK) 140 MG/ML SOAJ Inject 140 mg into the skin every 14 (fourteen) days.   furosemide (LASIX) 20 MG tablet TAKE 1  TABLET BY MOUTH 3 TIMES A WEEK AS NEEDED   gabapentin (NEURONTIN) 800 MG tablet Take 800 mg by mouth 3 (three) times daily.   losartan (COZAAR) 100 MG tablet Take 1 tablet (100 mg total) by mouth daily.   oxyCODONE-acetaminophen (PERCOCET) 10-325 MG tablet Take 1 tablet by mouth every 6 (six) hours as needed.   No facility-administered encounter medications on file as of 09/11/2022.    Recent vitals BP Readings from Last 3 Encounters:  06/25/22 (!) 176/113  05/28/22 (!) 173/90  05/21/22 (!) 150/80   Pulse Readings from Last 3 Encounters:  06/25/22 60  05/28/22 (!) 52  05/21/22 (!) 58   Wt Readings from Last 3 Encounters:  05/21/22 262 lb 9.6 oz (119.1 kg)  05/19/22 259 lb 3.2 oz (117.6 kg)  05/08/22 260 lb (117.9 kg)   BMI Readings from Last 3 Encounters:  05/21/22 38.78 kg/m  05/19/22 38.28 kg/m  05/08/22 38.40 kg/m    Recent lab results    Component Value Date/Time   NA 143 05/19/2022 0820   NA 141 01/11/2019 0901   K 4.4 05/19/2022 0820   CL 108 05/19/2022 0820   CO2 27 05/19/2022 0820   GLUCOSE 120 (H) 05/19/2022 0820   BUN 16 05/19/2022 0820   BUN 17 01/11/2019 0901   CREATININE 1.26 05/19/2022 0820   CALCIUM 9.4 05/19/2022 0820    Lab Results  Component Value Date   CREATININE 1.26 05/19/2022   GFR 65.18 02/21/2014   EGFR 66 05/19/2022  GFRNONAA >60 04/15/2021   GFRAA 68 02/15/2020   Lab Results  Component Value Date/Time   HGBA1C 5.9 (H) 02/15/2020 08:21 AM   HGBA1C 6.8 (H) 06/06/2019 09:11 AM    Lab Results  Component Value Date   CHOL 76 05/19/2022   HDL 40 05/19/2022   LDLCALC 20 05/19/2022   LDLDIRECT 117 (H) 02/23/2017   TRIG 81 05/19/2022   CHOLHDL 1.9 05/19/2022    Care Gaps: Annual wellness visit in last year? Yes  Star Rating Drugs:  Losartan 100 mg last filled 03/04/2022 90 DS   Future Appointments  Date Time Provider Department Center  12/18/2022  2:00 PM [facility] None  05/12/2023  8:30 AM  [facility]ANNUAL WELLNESS VISIT [facility]   April D Calhoun, [facility] Clinical Pharmacist Assistant [phone]

## 2022-09-11 NOTE — Discharge Instructions (Addendum)
I am concerned about your heart today.  Please go straight to the hospital for further evaluation and management of the shortness of breath.

## 2022-09-11 NOTE — ED Provider Notes (Signed)
[place] [facility] AT [facility] Provider Note   CSN: [id number] Arrival date & time: 09/11/22  1451     History  Chief Complaint  Patient presents with   Chest Pain    [name] is a 62 y.o. male.   Chest Pain This patient is a 62 year old male, history of hypertension on amlodipine and losartan, history of heart disease status post bypass grafting on Plavix, he also takes atorvastatin.  He reports that for about 3 to 4 days he has been feeling generally weak in fact he took the garbage out to the curb the other day and had to sit down in the driveway on the way back because he was so weak and short of breath.  This seemed to get worse over the last couple of days and today presents with a fever of 101.8.  He is not coughing he is not vomiting he has no diarrhea no sore throat or runny nose.  He does endorse pain in his bilateral legs radiating up into his groin and feel like his legs ache.  He does report having some left-sided chest pain as well.  He denies headaches, denies any other injuries, denies that anybody has been sick around him to the best of his knowledge.  He did go to the urgent care prior to coming here and because of an abnormal EKG was told to come in     Home Medications Prior to Admission medications   Medication Sig Start Date End Date Taking? Authorizing Provider  amLODipine (NORVASC) 10 MG tablet Take 1 tablet (10 mg total) by mouth daily. 06/02/22   Nahser, Deloris Ping, MD  aspirin EC 81 MG tablet Take 1 tablet (81 mg total) by mouth every morning. 11/07/16   [name], MD  atorvastatin (LIPITOR) 80 MG tablet Take 1 tablet (80 mg total) by mouth at bedtime. PT NEEDS OV FOR FURTHER REFILLS, NOT SEEN SINCE 2021 06/18/21   [name], MD  clopidogrel (PLAVIX) 75 MG tablet TAKE 1 TABLET(75 MG) BY MOUTH DAILY 08/06/22   Nahser, Deloris Ping, MD  Evolocumab (REPATHA SURECLICK) 140 MG/ML SOAJ Inject 140 mg into the skin every 14  (fourteen) days. 12/03/21   Nahser, Deloris Ping, MD  furosemide (LASIX) 20 MG tablet TAKE 1 TABLET BY MOUTH 3 TIMES A WEEK AS NEEDED 07/12/21   Nahser, Deloris Ping, MD  gabapentin (NEURONTIN) 800 MG tablet Take 800 mg by mouth 3 (three) times daily. 03/22/21   [provider]  losartan (COZAAR) 100 MG tablet Take 1 tablet (100 mg total) by mouth daily. 04/10/22   Nahser, Deloris Ping, MD  oxyCODONE-acetaminophen (PERCOCET) 10-325 MG tablet Take 1 tablet by mouth every 6 (six) hours as needed. 04/25/21   [provider]      Allergies    Rosuvastatin    Review of Systems   Review of Systems  Cardiovascular:  Positive for chest pain.  All other systems reviewed and are negative.   Physical Exam Updated Vital Signs BP 132/83 (BP Location: Right Arm)   Pulse (!) 43   Temp (!) 101.8 F (38.8 C) (Oral)   Resp (!) 22   SpO2 97%  Physical Exam Vitals and nursing note reviewed.  Constitutional:      General: He is not in acute distress.    Appearance: He is well-developed.  HENT:     Head: Normocephalic and atraumatic.     Mouth/Throat:     Pharynx: No  oropharyngeal exudate.  Eyes:     General: No scleral icterus.       Right eye: No discharge.        Left eye: No discharge.     Conjunctiva/sclera: Conjunctivae normal.     Pupils: Pupils are equal, round, and reactive to light.  Neck:     Thyroid: No thyromegaly.     Vascular: No JVD.  Cardiovascular:     Rate and Rhythm: Bradycardia present. Rhythm irregular.     Heart sounds: Normal heart sounds. No murmur heard.    No friction rub. No gallop.  Pulmonary:     Effort: Pulmonary effort is normal. No respiratory distress.     Breath sounds: Normal breath sounds. No wheezing or rales.  Abdominal:     General: Bowel sounds are normal. There is no distension.     Palpations: Abdomen is soft. There is no mass.     Tenderness: There is no abdominal tenderness.  Musculoskeletal:        General: No tenderness. Normal range  of motion.     Cervical back: Normal range of motion and neck supple.     Right lower leg: No edema.     Left lower leg: No edema.  Lymphadenopathy:     Cervical: No cervical adenopathy.  Skin:    General: Skin is warm and dry.     Findings: No erythema or rash.  Neurological:     Mental Status: He is alert.     Coordination: Coordination normal.  Psychiatric:        Behavior: Behavior normal.     ED Results / Procedures / Treatments   Labs (all labs ordered are listed, but only abnormal results are displayed) Labs Reviewed  BASIC METABOLIC PANEL  CBC  TROPONIN I (HIGH SENSITIVITY)    EKG EKG Interpretation  Date/Time:  Thursday Sep 11 2022 15:02:37 EDT Ventricular Rate:  87 PR Interval:  132 QRS Duration: 88 QT Interval:  356 QTC Calculation: 428 R Axis:   3 Text Interpretation: Sinus rhythm with frequent Premature ventricular complexes Inferior infarct , age undetermined Possible Anterior infarct (cited on or before 11-Sep-2022) Abnormal ECG When compared with ECG of 11-Sep-2022 13:27, Inferior infarct is now Present Serial changes of Anterior infarct Present Confirmed by Eber Hong ([id number]) on 09/11/2022 3:27:30 PM  Radiology DG Chest 2 View  Result Date: 09/11/2022 CLINICAL DATA:  Cough, shortness of breath EXAM: CHEST - 2 VIEW COMPARISON:  Previous studies including chest radiographs done on 05/31/2016 FINDINGS: Transit of diameter of heart is increased. There is previous coronary bypass surgery. There are no signs of pulmonary edema or new focal infiltrates. There is no pleural effusion or pneumothorax. IMPRESSION: No active cardiopulmonary disease. Electronically Signed   By: Ernie Avena M.D.   On: 09/11/2022 13:54    Procedures Procedures    Medications Ordered in ED Medications  acetaminophen (TYLENOL) tablet 650 mg (has no administration in time range)    ED Course/ Medical Decision Making/ A&P                             Medical Decision  Making Amount and/or Complexity of Data Reviewed Labs: ordered. Radiology: ordered. ECG/medicine tests: ordered.  Risk OTC drugs. Prescription drug management. Decision regarding hospitalization.    This patient presents to the ED for concern of generalized weakness and shortness of breath, this involves an extensive number of treatment options, and is  a complaint that carries with it a high risk of complications and morbidity.  The differential diagnosis includes section, sepsis, COVID, pneumonia, endocarditis, myocarditis, DVT or pulmonary embolism though that seems less likely without hypoxia or tachycardia, in fact the bradycardia is somewhat concerning given the patient's lack of being on a AV nodal blocking agent.  The mild this may be secondary to rhabdo but this is more likely related to some type of viral syndrome or sepsis   Co morbidities that complicate the patient evaluation  Obesity, history of coronary disease   Additional history obtained:  Additional history obtained from medical record External records from outside source obtained and reviewed including prior notes from office visits, patient has chronic pain seen in pain clinic, followed by cardiology with Dr. Elease Hashimoto, on Plavix, no recent admissions to the hospital over the last year   Lab Tests:  I Ordered, and personally interpreted labs.  The pertinent results include: Slight elevation in troponin but similar to prior, no leukocytosis, normal lactic, blood cultures pending   Imaging Studies ordered:  I ordered imaging studies including chest x-ray I independently visualized and interpreted imaging which showed no acute findings I agree with the radiologist interpretation   Cardiac Monitoring: / EKG:  The patient was maintained on a cardiac monitor.  I personally viewed and interpreted the cardiac monitored which showed an underlying rhythm of: Frequent ectopy   Consultations Obtained:  I requested  consultation with the cardiologist Dr. Royann Shivers,  and discussed lab and imaging findings as well as pertinent plan - they recommend: Admission to the hospital, they will consult, request hospitalist admit   Problem List / ED Course / Critical interventions / Medication management  Patient with possible myocarditis given chest pain fever shortness of breath generalized weakness and no other cause for the patient's symptoms. I ordered medication including Rocephin for initial fever and infection as concern for bacteremia Reevaluation of the patient after these medicines showed that the patient improved I have reviewed the patients home medicines and have made adjustments as needed   Social Determinants of Health:  Known coronary disease   Test / Admission - Considered:  Will admit, will discuss with hospitalist for admission Discussed all of the EKG findings with cardiology, they agree that these are all old findings.  They will provide consultation in the hospital and agreed to not treat this as an acute coronary syndrome at this time.         Final Clinical Impression(s) / ED Diagnoses Final diagnoses:  Acute infective myocarditis, due to unspecified organism     Eber Hong, MD 09/13/22 0830

## 2022-09-11 NOTE — Assessment & Plan Note (Signed)
-  Stable -DuoNebs as needed 

## 2022-09-11 NOTE — ED Notes (Signed)
Patient is being discharged from the Urgent Care and sent to the Emergency Department via private vehicle . Per NP, patient is in need of higher level of care due to SOB. Patient is aware and verbalizes understanding of plan of care.  Vitals:   09/11/22 1311  BP: (!) 164/91  Pulse: 77  Resp: (!) 22  Temp: 97.8 F (36.6 C)  SpO2: 96%

## 2022-09-12 ENCOUNTER — Observation Stay (HOSPITAL_COMMUNITY): Payer: Medicare HMO

## 2022-09-12 ENCOUNTER — Encounter (HOSPITAL_COMMUNITY): Payer: Self-pay | Admitting: Internal Medicine

## 2022-09-12 DIAGNOSIS — Z955 Presence of coronary angioplasty implant and graft: Secondary | ICD-10-CM | POA: Diagnosis not present

## 2022-09-12 DIAGNOSIS — Z7902 Long term (current) use of antithrombotics/antiplatelets: Secondary | ICD-10-CM

## 2022-09-12 DIAGNOSIS — Z8249 Family history of ischemic heart disease and other diseases of the circulatory system: Secondary | ICD-10-CM | POA: Diagnosis not present

## 2022-09-12 DIAGNOSIS — Z951 Presence of aortocoronary bypass graft: Secondary | ICD-10-CM

## 2022-09-12 DIAGNOSIS — E785 Hyperlipidemia, unspecified: Secondary | ICD-10-CM | POA: Diagnosis present

## 2022-09-12 DIAGNOSIS — Z1152 Encounter for screening for COVID-19: Secondary | ICD-10-CM | POA: Diagnosis not present

## 2022-09-12 DIAGNOSIS — I25810 Atherosclerosis of coronary artery bypass graft(s) without angina pectoris: Secondary | ICD-10-CM | POA: Diagnosis present

## 2022-09-12 DIAGNOSIS — M79604 Pain in right leg: Secondary | ICD-10-CM | POA: Diagnosis not present

## 2022-09-12 DIAGNOSIS — S30860A Insect bite (nonvenomous) of lower back and pelvis, initial encounter: Secondary | ICD-10-CM | POA: Diagnosis not present

## 2022-09-12 DIAGNOSIS — Z87891 Personal history of nicotine dependence: Secondary | ICD-10-CM | POA: Diagnosis not present

## 2022-09-12 DIAGNOSIS — J449 Chronic obstructive pulmonary disease, unspecified: Secondary | ICD-10-CM | POA: Diagnosis present

## 2022-09-12 DIAGNOSIS — R079 Chest pain, unspecified: Secondary | ICD-10-CM

## 2022-09-12 DIAGNOSIS — R0602 Shortness of breath: Secondary | ICD-10-CM | POA: Diagnosis not present

## 2022-09-12 DIAGNOSIS — M79605 Pain in left leg: Secondary | ICD-10-CM | POA: Diagnosis not present

## 2022-09-12 DIAGNOSIS — D61818 Other pancytopenia: Secondary | ICD-10-CM | POA: Diagnosis present

## 2022-09-12 DIAGNOSIS — N179 Acute kidney failure, unspecified: Secondary | ICD-10-CM | POA: Diagnosis present

## 2022-09-12 DIAGNOSIS — I739 Peripheral vascular disease, unspecified: Secondary | ICD-10-CM | POA: Diagnosis present

## 2022-09-12 DIAGNOSIS — W57XXXA Bitten or stung by nonvenomous insect and other nonvenomous arthropods, initial encounter: Secondary | ICD-10-CM | POA: Diagnosis present

## 2022-09-12 DIAGNOSIS — Z79899 Other long term (current) drug therapy: Secondary | ICD-10-CM | POA: Diagnosis not present

## 2022-09-12 DIAGNOSIS — I10 Essential (primary) hypertension: Secondary | ICD-10-CM | POA: Diagnosis present

## 2022-09-12 DIAGNOSIS — Z7982 Long term (current) use of aspirin: Secondary | ICD-10-CM | POA: Diagnosis not present

## 2022-09-12 DIAGNOSIS — I25709 Atherosclerosis of coronary artery bypass graft(s), unspecified, with unspecified angina pectoris: Secondary | ICD-10-CM

## 2022-09-12 DIAGNOSIS — I252 Old myocardial infarction: Secondary | ICD-10-CM | POA: Diagnosis not present

## 2022-09-12 DIAGNOSIS — Z95820 Peripheral vascular angioplasty status with implants and grafts: Secondary | ICD-10-CM | POA: Diagnosis not present

## 2022-09-12 DIAGNOSIS — Z833 Family history of diabetes mellitus: Secondary | ICD-10-CM | POA: Diagnosis not present

## 2022-09-12 DIAGNOSIS — R0789 Other chest pain: Secondary | ICD-10-CM

## 2022-09-12 DIAGNOSIS — E669 Obesity, unspecified: Secondary | ICD-10-CM | POA: Diagnosis present

## 2022-09-12 DIAGNOSIS — R509 Fever, unspecified: Secondary | ICD-10-CM | POA: Diagnosis not present

## 2022-09-12 DIAGNOSIS — I2510 Atherosclerotic heart disease of native coronary artery without angina pectoris: Secondary | ICD-10-CM | POA: Diagnosis present

## 2022-09-12 DIAGNOSIS — Z8673 Personal history of transient ischemic attack (TIA), and cerebral infarction without residual deficits: Secondary | ICD-10-CM | POA: Diagnosis not present

## 2022-09-12 DIAGNOSIS — I400 Infective myocarditis: Secondary | ICD-10-CM | POA: Diagnosis present

## 2022-09-12 DIAGNOSIS — Z83438 Family history of other disorder of lipoprotein metabolism and other lipidemia: Secondary | ICD-10-CM | POA: Diagnosis not present

## 2022-09-12 LAB — CBC
HCT: 39.6 % (ref 39.0–52.0)
Hemoglobin: 12.9 g/dL — ABNORMAL LOW (ref 13.0–17.0)
MCH: 27.9 pg (ref 26.0–34.0)
MCHC: 32.6 g/dL (ref 30.0–36.0)
MCV: 85.5 fL (ref 80.0–100.0)
Platelets: 109 10*3/uL — ABNORMAL LOW (ref 150–400)
RBC: 4.63 MIL/uL (ref 4.22–5.81)
RDW: 13.1 % (ref 11.5–15.5)
WBC: 5.1 10*3/uL (ref 4.0–10.5)
nRBC: 0 % (ref 0.0–0.2)

## 2022-09-12 LAB — ECHOCARDIOGRAM COMPLETE
Area-P 1/2: 5.66 cm2
Height: 69 in
S' Lateral: 3.6 cm
Weight: 4169.6 oz

## 2022-09-12 LAB — BASIC METABOLIC PANEL
Anion gap: 10 (ref 5–15)
BUN: 25 mg/dL — ABNORMAL HIGH (ref 6–20)
CO2: 21 mmol/L — ABNORMAL LOW (ref 22–32)
Calcium: 7.9 mg/dL — ABNORMAL LOW (ref 8.9–10.3)
Chloride: 105 mmol/L (ref 98–111)
Creatinine, Ser: 2.6 mg/dL — ABNORMAL HIGH (ref 0.61–1.24)
GFR, Estimated: 27 mL/min — ABNORMAL LOW (ref >60)
Glucose, Bld: 150 mg/dL — ABNORMAL HIGH (ref 70–99)
Potassium: 3.8 mmol/L (ref 3.5–5.1)
Sodium: 136 mmol/L (ref 135–145)

## 2022-09-12 LAB — CULTURE, BLOOD (ROUTINE X 2)
Culture: NO GROWTH
Special Requests: ADEQUATE

## 2022-09-12 LAB — HIV ANTIBODY (ROUTINE TESTING W REFLEX): HIV Screen 4th Generation wRfx: NONREACTIVE

## 2022-09-12 LAB — C-REACTIVE PROTEIN: CRP: 6.3 mg/dL — ABNORMAL HIGH (ref <1.0)

## 2022-09-12 MED ORDER — SODIUM BICARBONATE 650 MG PO TABS
1300.0000 mg | ORAL_TABLET | Freq: Two times a day (BID) | ORAL | Status: DC
  Administered 2022-09-12 – 2022-09-14 (×4): 1300 mg via ORAL
  Filled 2022-09-12 (×4): qty 2

## 2022-09-12 MED ORDER — SODIUM CHLORIDE 0.9 % IV SOLN: INTRAVENOUS | Status: AC

## 2022-09-12 MED ORDER — HYDRALAZINE HCL 20 MG/ML IJ SOLN: 10.0000 mg | Freq: Four times a day (QID) | INTRAMUSCULAR | Status: DC | PRN

## 2022-09-12 MED ORDER — ONDANSETRON HCL 4 MG/2ML IJ SOLN
4.0000 mg | Freq: Four times a day (QID) | INTRAMUSCULAR | Status: DC | PRN
  Administered 2022-09-12 – 2022-09-14 (×2): 4 mg via INTRAVENOUS
  Filled 2022-09-12 (×2): qty 2

## 2022-09-12 MED ORDER — ACETAMINOPHEN 325 MG PO TABS
650.0000 mg | ORAL_TABLET | Freq: Four times a day (QID) | ORAL | Status: DC | PRN
  Administered 2022-09-12 – 2022-09-14 (×4): 650 mg via ORAL
  Filled 2022-09-12 (×4): qty 2

## 2022-09-12 MED ORDER — PERFLUTREN LIPID MICROSPHERE
1.0000 mL | INTRAVENOUS | Status: AC | PRN
  Administered 2022-09-12: 8 mL via INTRAVENOUS

## 2022-09-12 NOTE — Care Management Obs Status (Signed)
MEDICARE OBSERVATION STATUS NOTIFICATION   Patient Details  Name: [name] MRN: [id number] Date of Birth: [birth year]   Medicare Observation Status Notification Given:  Yes    Corey Harold 09/12/2022, 10:00 AM

## 2022-09-12 NOTE — Progress Notes (Signed)
Pt slept through the night. PRN Morphine given for reported pain. Pt stated he "felt nauseous after the Morphine." MD notified, PRN Zofran given per order. Pt Dyspneic at rest this a.m. SP02 98%. HR 77.

## 2022-09-12 NOTE — Consult Note (Addendum)
Cardiology Consultation   Patient ID: [name] MRN: [id number]; DOB: 20-Feb-1961  Admit date: 09/11/2022 Date of Consult: 09/12/2022  PCP:  [name], MD    [facility] Providers Cardiologist:  Kristeen Miss, MD   {  Patient Profile:   [name] is a 62 y.o. male with a hx of CAD (s/p CABG in 2010 with DES to RCA in 2011, cath in 2018 showing known occlusion of SVG-RCA with other grafts and stent along native RCA being patent), PAD (s/p iliac stenting), HTN, HLD, OSA and COPD who is being seen 09/12/2022 for the evaluation of chest pain and elevated troponin values at the request of Dr. Wendall Stade.  History of Present Illness:   [name] was last examined by Dr. Elease Hashimoto in 04/2022 and was taking care of his wife who was mostly bedbound following prior strokes and he denied any recent anginal symptoms at that time. He was continued on his current cardiac medications including Amlodipine 10 mg daily, ASA 81 mg daily, Atorvastatin 80 mg daily, Plavix 75 mg daily, Lasix 20 mg PRN, Losartan 100 mg daily and Repatha.  He presented to [facility] ED on 09/11/2022 for evaluation of worsening dyspnea, headaches and fevers. Was noted to be febrile upon arrival with temperature at 101.8. Had initially presented to Urgent Care but was advised to go to the ED. In talking with the patient today, he reports that 3 days ago he developed discomfort along his left precordial region, left arm, bilateral legs and a headache. Reports the discomfort started all at once and has been persistent for the past 3 days. His chest pain and leg pain are not worse with activity and occur at rest. Chest discomfort is not worse with positional changes or deep breathing. He reports having dyspnea but denies any specific orthopnea, PND or pitting edema. He remains active at baseline in taking care of his yard and denies any recent anginal symptoms with this. He is also the primary caregiver for his wife  who is currently bedbound due to prior strokes. He does report having a tick bite in the past but this has been over a month ago.  Initial labs showed WBC 5.6, Hgb 13.9, platelets 128, Na+ 135, K+ 3.5 and creatinine 2.10 (previously at 1.26 in 04/2022). AST 29 and ALT 21. Negative for COVID and influenza. Blood cultures are pending. Procalcitonin elevated to 0.78. Initial and repeat Hs Troponin values were flat at 29 and 30.  CXR with no active cardiopulmonary disease. EKG shows normal sinus rhythm, HR 81 with frequent PVC's and T wave inversion along the inferior and lateral leads which has been noted on prior tracings.  He was admitted for management of SIRS and evaluation of his fever. Has been started on broad-spectrum antibiotics with Vancomycin, Cefepime and Metronidazole. In regards to his AKI, Losartan was held at the time of admission with him being started on normal saline. Repeat labs this morning show his electrolytes are within normal limits but creatinine has further increased to 2.60.   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   Chest pain    COPD (chronic obstructive pulmonary disease) (HCC)    Coronary artery disease    STATUS POST CABG 01/2009   Dyslipidemia    Hyperlipidemia    Hypertension    Orthostatic hypotension    Peripheral vascular disease (HCC)    Syncope and collapse     Past Surgical  History:  Procedure Laterality Date   ANKLE SURGERY     x 2   COLONOSCOPY  April 2013   CORONARY ANGIOPLASTY     CORONARY ARTERY BYPASS GRAFT  2010   FOOT SURGERY     HERNIA REPAIR  01/23/12   umb hernia   Kidney stimulator     LEFT HEART CATH AND CORS/GRAFTS ANGIOGRAPHY N/A 06/02/2016   Procedure: Left Heart Cath and Cors/Grafts Angiography;  Surgeon: Runell Gess, MD;  Location: [facility] INVASIVE CV LAB;  Service: Cardiovascular;  Laterality: N/A;   LEFT HEART CATHETERIZATION WITH CORONARY/GRAFT ANGIOGRAM N/A 07/08/2012   Procedure: LEFT  HEART CATHETERIZATION WITH Isabel Caprice;  Surgeon: Vesta Mixer, MD;  Location: [facility] CATH LAB;  Service: Cardiovascular;  Laterality: N/A;   PR VEIN BYPASS GRAFT,AORTO-FEM-POP     STERNAL WIRES REMOVAL  07/08/2011   Procedure: STERNAL WIRES REMOVAL;  Surgeon: [name], MD;  Location: [facility] OR;  Service: Open Heart Surgery;  Laterality: N/A;  removal of 4th wire     Home Medications:  Prior to Admission medications   Medication Sig Start Date End Date Taking? Authorizing Provider  amLODipine (NORVASC) 10 MG tablet Take 1 tablet (10 mg total) by mouth daily. 06/02/22  Yes Nahser, Deloris Ping, MD  aspirin EC 81 MG tablet Take 1 tablet (81 mg total) by mouth every morning. 11/07/16  Yes [name], MD  atorvastatin (LIPITOR) 80 MG tablet Take 1 tablet (80 mg total) by mouth at bedtime. PT NEEDS OV FOR FURTHER REFILLS, NOT SEEN SINCE 2021 06/18/21  Yes [name], MD  clopidogrel (PLAVIX) 75 MG tablet TAKE 1 TABLET(75 MG) BY MOUTH DAILY 08/06/22  Yes Nahser, Deloris Ping, MD  Evolocumab (REPATHA SURECLICK) 140 MG/ML SOAJ Inject 140 mg into the skin every 14 (fourteen) days. 12/03/21  Yes Nahser, Deloris Ping, MD  furosemide (LASIX) 20 MG tablet TAKE 1 TABLET BY MOUTH 3 TIMES A WEEK AS NEEDED 07/12/21  Yes Nahser, Deloris Ping, MD  gabapentin (NEURONTIN) 800 MG tablet Take 800 mg by mouth 3 (three) times daily. 03/22/21  Yes [provider]  losartan (COZAAR) 100 MG tablet Take 1 tablet (100 mg total) by mouth daily. 04/10/22  Yes Nahser, Deloris Ping, MD  oxyCODONE-acetaminophen (PERCOCET) 10-325 MG tablet Take 1 tablet by mouth every 6 (six) hours as needed. 04/25/21  Yes [provider]    Inpatient Medications: Scheduled Meds:  amLODipine  10 mg Oral Daily   aspirin EC  81 mg Oral Daily   atorvastatin  80 mg Oral QHS   clopidogrel  75 mg Oral Daily   gabapentin  800 mg Oral TID   heparin  5,000 Units Subcutaneous Q8H   Continuous Infusions:  sodium chloride 100  mL/hr at 09/11/22 2215   ceFEPime (MAXIPIME) IV 2 g (09/11/22 2322)   metronidazole 500 mg (09/11/22 2217)   vancomycin     PRN Meds: ipratropium-albuterol, morphine injection, nitroGLYCERIN, ondansetron (ZOFRAN) IV  Allergies:    Allergies  Allergen Reactions   Rosuvastatin     Muscle aches. Other reaction(s): leg pain, Other Muscle aches. Other reaction(s): leg pain Other reaction(s): leg pain     Social History:   Social History   Socioeconomic History   Marital status: Married    Spouse name: Irving Burton   Number of children: 2   Years of education: Not on file   Highest education level: Not on file  Occupational History   Occupation: Disability  Tobacco Use  Smoking status: Former    Packs/day: 3.00    Years: 30.00    Additional pack years: 0.00    Total pack years: 90.00    Types: Cigarettes, Cigars    Quit date: 01/26/2009    Years since quitting: 13.6   Smokeless tobacco: Never  Vaping Use   Vaping Use: Never used  Substance and Sexual Activity   Alcohol use: No   Drug use: No   Sexual activity: Not Currently  Other Topics Concern   Not on file  Social History Narrative   Married x 37 years. Irving Burton)    Social Determinants of Health   Financial Resource Strain: Low Risk  (06/04/2022)   Overall Financial Resource Strain (CARDIA)    Difficulty of Paying Living Expenses: Not hard at all  Food Insecurity: No Food Insecurity (09/11/2022)   Hunger Vital Sign    Worried About Running Out of Food in the Last Year: Never true    Ran Out of Food in the Last Year: Never true  Transportation Needs: No Transportation Needs (09/11/2022)   PRAPARE - Administrator, Civil Service (Medical): No    Lack of Transportation (Non-Medical): No  Physical Activity: Sufficiently Active (05/08/2022)   Exercise Vital Sign    Days of Exercise per Week: 5 days    Minutes of Exercise per Session: 30 min  Stress: No Stress Concern Present (05/08/2022)   [employer]  of Occupational Health - Occupational Stress Questionnaire    Feeling of Stress : Not at all  Social Connections: Moderately Isolated (05/08/2022)   Social Connection and Isolation Panel [NHANES]    Frequency of Communication with Friends and Family: More than three times a week    Frequency of Social Gatherings with Friends and Family: Three times a week    Attends Religious Services: Never    Active Member of Clubs or Organizations: No    Attends Banker Meetings: Never    Marital Status: Married  Catering manager Violence: Not At Risk (09/11/2022)   Humiliation, Afraid, Rape, and Kick questionnaire    Fear of Current or Ex-Partner: No    Emotionally Abused: No    Physically Abused: No    Sexually Abused: No    Family History:    Family History  Problem Relation Age of Onset   Coronary artery disease Father 40   Diabetes Father    Hyperlipidemia Father    Heart disease Father        before age 2   Hypertension Father    Cancer Mother        uterine   Hyperlipidemia Mother    Diabetes Mother    Hypertension Mother    Hypertension Brother      ROS:  Please see the history of present illness.   All other ROS reviewed and negative.     Physical Exam/Data:   Vitals:   09/11/22 2139 09/12/22 0002 09/12/22 0406 09/12/22 0527  BP:  (!) 163/88 (!) 145/71   Pulse:  82  77  Resp:  (!) 24 (!) 22   Temp:  99.5 F (37.5 C) 99.6 F (37.6 C)   TempSrc:  Oral Oral   SpO2: 100% 96% 95% 98%  Weight:      Height:        Intake/Output Summary (Last 24 hours) at 09/12/2022 0855 Last data filed at 09/12/2022 0444 Gross per 24 hour  Intake 600.03 ml  Output --  Net 600.03 ml  09/11/2022    8:05 PM 05/21/2022    2:57 PM 05/19/2022    8:00 AM  Last 3 Weights  Weight (lbs) 260 lb 9.6 oz 262 lb 9.6 oz 259 lb 3.2 oz  Weight (kg) 118.207 kg 119.115 kg 117.572 kg     Body mass index is 38.48 kg/m.  General: Pleasant male appearing in no acute distress HEENT:  normal Neck: no JVD Vascular: No carotid bruits; Distal pulses 2+ bilaterally Cardiac:  normal S1, S2; RRR with occasional ectopic beats. Lungs:  clear to auscultation bilaterally, no wheezing, rhonchi or rales  Abd: soft, nontender, no hepatomegaly  Ext: no pitting edema Musculoskeletal:  No deformities, BUE and BLE strength normal and equal Skin: warm and dry  Neuro:  CNs 2-12 intact, no focal abnormalities noted Psych:  Normal affect   EKG:  The EKG was personally reviewed and demonstrates: NSR, HR 81 with frequent PVC's and TWI along the inferior and lateral leads which has been noted on prior tracings Telemetry:  Telemetry was personally reviewed and demonstrates: Normal sinus rhythm, HR in 80's to 90's with frequent PVC's and occasional episodes of ventricular bigeminy.  Relevant CV Studies:  Cardiac Catheterization: 05/2016     IMPRESSION: Mr. Kasel anatomy is unchanged from his last cardiac catheterization performed 2014. All his grafts are patent except his RCA vein graft which was demonstrated to be occluded 4 years ago. His RCA stent is patent as well. He has normal LV function. There were no protocol per lesions". He will be treated for "noncardiac chest pain". His right common femoral arterial puncture site was sealed with the "MYNX device". The patient left the lab in stable condition.   Echocardiogram: 05/2019 IMPRESSIONS     1. Left ventricular ejection fraction, by estimation, is 55 to 60%. Left  ventricular ejection fraction by PLAX is 56 %. The left ventricle has  normal function. The left ventricle has no regional wall motion  abnormalities. There is moderate concentric  left ventricular hypertrophy. Left ventricular diastolic parameters are  consistent with Grade I diastolic dysfunction (impaired relaxation).   2. Right ventricular systolic function is normal. The right ventricular  size is normal. There is mildly elevated pulmonary artery systolic   pressure.   3. Left atrial size was moderately dilated.   4. The mitral valve is normal in structure and function. No evidence of  mitral valve regurgitation. No evidence of mitral stenosis.   5. The aortic valve is tricuspid. Aortic valve regurgitation is not  visualized. No aortic stenosis is present.   6. The inferior vena cava is normal in size with greater than 50%  respiratory variability, suggesting right atrial pressure of 3 mmHg.   Laboratory Data:  High Sensitivity Troponin:   Recent Labs  Lab 09/11/22 1614 09/11/22 1804  TROPONINIHS 29* 30*     Chemistry Recent Labs  Lab 09/11/22 1614 09/12/22 0410  NA 135 136  K 3.5 3.8  CL 102 105  CO2 22 21*  GLUCOSE 154* 150*  BUN 23* 25*  CREATININE 2.10* 2.60*  CALCIUM 8.4* 7.9*  GFRNONAA 35* 27*  ANIONGAP 11 10    Recent Labs  Lab 09/11/22 1614  PROT 6.8  ALBUMIN 3.9  AST 29  ALT 21  ALKPHOS 76  BILITOT 1.0   Lipids No results for input(s): "CHOL", "TRIG", "HDL", "LABVLDL", "LDLCALC", "CHOLHDL" in the last 168 hours.  Hematology Recent Labs  Lab 09/11/22 1614 09/12/22 0410  WBC 5.6 5.1  RBC 5.02 4.63  HGB 13.9 12.9*  HCT 42.5 39.6  MCV 84.7 85.5  MCH 27.7 27.9  MCHC 32.7 32.6  RDW 13.2 13.1  PLT 128* 109*   Thyroid No results for input(s): "TSH", "FREET4" in the last 168 hours.  BNPNo results for input(s): "BNP", "PROBNP" in the last 168 hours.  DDimer No results for input(s): "DDIMER" in the last 168 hours.   Radiology/Studies:  DG Chest 2 View  Result Date: 09/11/2022 CLINICAL DATA:  Cough, shortness of breath EXAM: CHEST - 2 VIEW COMPARISON:  Previous studies including chest radiographs done on 05/31/2016 FINDINGS: Transit of diameter of heart is increased. There is previous coronary bypass surgery. There are no signs of pulmonary edema or new focal infiltrates. There is no pleural effusion or pneumothorax. IMPRESSION: No active cardiopulmonary disease. Electronically Signed   By: Ernie Avena M.D.   On: 09/11/2022 13:54     Assessment and Plan:   1. Chest Pain with Atypical Features/Elevated Troponin Values - His recent episodes of chest pain overall seem atypical for ACS as his discomfort has been present consistently for the past 3 days and is not associated with exertion. No association with positional changes or deep breathing. Hs Troponin values have been flat at 29 and 30 which is not consistent with ACS and sed rate was normal with CRP pending. - Agree with obtaining an echocardiogram to assess for any structural abnormalities. If found to have a new cardiomyopathy, would not be a candidate for LHC at this time given his AKI but could consider based off improvement in renal function.  2. CAD - He is s/p CABG in 2010 with DES to RCA in 2011 with cath in 2018 showing known occlusion of SVG-RCA with other grafts and stent along native RCA being patent. -Continue ASA 81 mg daily, Plavix 75 mg daily and Atorvastatin 80 mg daily. Also on Repatha as an outpatient. LDL was at 20 in 04/2022.  3. HTN - BP was elevated at 145/71 on most recent check and he is scheduled to receive Amlodipine 10 mg daily this morning. Losartan currently held given his AKI.  4. PVC's/Ventricular Bigeminy - He has noted to have frequent PVC's and episodes of ventricular bigeminy on telemetry. By review of prior EKG tracings, this is not new. It is unclear why he is not on beta-blocker therapy and would consider initiation of Lopressor 12.5 mg twice daily which can be titrated as needed. Would consider a monitor as an outpatient to assess his PVC burden.  5. AKI - Creatinine elevated to 2.10 on admission and previously at 1.26 in 04/2022. This is further trending up to 2.60 today. Losartan has been held and he is receiving IV fluids.  6. Fever/Elevated Procal/Headaches - He was febrile to 101.8 on admission and met SIRS criteria. Negative for COVID and Influenza. Procalcitonin elevated to 0.78  and he remains on broad-spectrum antibiotics at this time. He does report having a tick bite but this was more than a month ago.  Will defer serologies to the admitting team if indicated.     For questions or updates, please contact [facility] Please consult [email or web address] for contact info under    Signed, Ellsworth Lennox, PA-C  09/12/2022 8:55 AM    Attending attestation  Patient seen and independently examined with Randall An, PA-C. We discussed all aspects of the encounter. I agree with the assessment and plan as stated above.  Patient is a 62 year old M known to have CAD s/p CABG  in 2010 with RCA PCI in 2011 (known occlusion to SVG to RCA in 2018), PAD s/p iliac artery stenting, HTN presented to ER with chest pain. He was pushing trash yesterday morning when he started to get sudden pain in his chest radiating to the left arm (similar to index pain when he had CABG in 2020), SOB and also pain in his bilateral lower extremities. Associated with throbbing headaches. These symptoms lasted for quite a while and still present during my interview. He is physically active at baseline and denies any angina in general. Physical examination is remarkable for patient not in acute respite distress, HEENT normal, JVD not able to examine due to body habitus, S1-S2 normal, S3 not heard, clear lungs, nontender and nondistended abdomen, no pitting edema in bilateral lower extremities, AAOx3. Labs are remarkable for WBC 5.1, hemoglobin 12.9, platelets 109, sodium 136, potassium 3.8, chloride 105, serum creatinine 2.6 (normal serum creatinine 3 months ago), procalcitonin 0.78, troponin I 29, 30.  Normal lactic acid levels.  # Atypical chest pain # AKI -Patient has chest pain similar to index pain at the time of CABG in 2010.  He continues to have chest pain radiating to the left arm during my interview (he has generalized pain). EKG showed old T wave inversions in inferolateral leads.  High-sensitivity troponins mildly elevated, 29 and 30. No indication of ACS protocol or LHC at this time due to AKI (2.1 on admission that went up to 2.6 today, had normal serum creatinine 3 months ago) and fever spike 101F. However will empirically treat with heparin drip for 48 hours. Consider treating underlying etiology, infection.  # CAD s/p CABG and PCI and PAD: Continue aspirin 81 mg once daily, Plavix 75 mg once daily and atorvastatin 80 mg nightly.  I have spent a total of 60 minutes with patient reviewing chart , telemetry, EKGs, labs and examining patient as well as establishing an assessment and plan that was discussed with the patient.  > 50% of time was spent in direct patient care.

## 2022-09-12 NOTE — Progress Notes (Signed)
  Echocardiogram 2D Echocardiogram has been performed.  Maren Reamer 09/12/2022, 9:39 AM

## 2022-09-12 NOTE — TOC Progression Note (Signed)
  Transition of Care [facility]) Screening Note   Patient Details  Name: BRAILON GOJCAJ Date of Birth: 04-Jun-1960   Transition of Care [facility]) CM/SW Contact:    Elliot Gault, LCSW Phone Number: 09/12/2022, 9:20 AM    Transition of Care Department [facility]) has reviewed patient and no TOC needs have been identified at this time. We will continue to monitor patient advancement through interdisciplinary progression rounds. If new patient transition needs arise, please place a TOC consult.

## 2022-09-12 NOTE — Progress Notes (Signed)
PROGRESS NOTE     [name], is a 62 y.o. male, DOB - December 17, 1960, ZOX:[id number]  Admit date - 09/11/2022   Admitting Physician [name], MD  Outpatient Primary MD for the patient is [name], MD  LOS - 0  Chief Complaint  Patient presents with   Chest Pain        Brief Narrative:  62 y.o. male with medical history significant for COPD, coronary artery disease with CABG in 2010, hypertension, peripheral vascular disease status post bilateral stent placement admitted on 09/11/2022 with FUO, AKI and chest pains   -Assessment and Plan: 1)Fever of unknown origin--POA -Fever resolving -No leukocytosis -Check procalcitonin- Elevated at 0.78 -CRP is elevated but sed rate is WNL -Chest x-ray and UA not suggestive of infection -Continue IV vancomycin cefepime and metronidazole pending culture data -Plan to discontinue antibiotics over the next 24 hours if remains afebrile and cultures remain negative  2)AKI (acute kidney injury) (HCC) Cr elevated 2.1, baseline 1.2-1.5. Creatinine this admission 2.10 >>2.60 -Hold Lasix 20 mg daily, hold losartan 100 mg daily -Continue IV fluids given worsening renal function -Patient is voiding well - renally adjust medications, avoid nephrotoxic agents / dehydration  / hypotension   3)Chest pain Chest pain + dyspnea with typical and atypical features.  In the setting of fever.  Troponin 29 > 30.  EKG/rhythm with frequent PVCs.  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  Follows with cardiology. -Echo from 09/12/2022 with EF of 55 to 60%, no AS and no MS -C/n aspirin, Plavix and Lipitor -IV morphine 2 mg every 4 hours as needed, nitro as needed -Hold off on starting beta-blocker due to concerns about bradycardia -Cardiology consult appreciated  4)Peripheral vascular disease (HCC) Reports persistent bilateral lower extremity pain.  No suggestion of infection.  Status post iliac stenting.  May  be neuropathy, he is on gabapentin. -Bilateral arterial Dopplers -Arterial ultrasound with ABI shows--Bilateral lower extremity ABIs greater than 1.4 are non-diagnostic secondary to incompressible vessel calcifications. And  Somewhat dampened arterial waveforms are noted at the right dorsalis pedis artery. --C/n aspirin, Plavix and Lipitor  5)COPD (chronic obstructive pulmonary disease) (HCC) Stable. -DuoNebs as needed  6)HTN- Stable. C/n  Norvasc, -Hold losartan with AKI  Status is: Inpatient   Disposition: The patient is from: Home              Anticipated d/c is to: Home              Anticipated d/c date is: 2 days              Patient currently is not medically stable to d/c. Barriers: Not Clinically Stable-   Code Status :  -  Code Status: Full Code   Family Communication:   (patient is alert, awake and coherent)  ,Discussed with patient's daughter [name] and patient's brother at bedside  DVT Prophylaxis  :   - SCDs  heparin injection 5,000 Units Start: 09/11/22 2230   Lab Results  Component Value Date   PLT 109 (L) 09/12/2022    Inpatient Medications  Scheduled Meds:  amLODipine  10 mg Oral Daily   aspirin EC  81 mg Oral Daily   atorvastatin  80 mg Oral QHS   clopidogrel  75 mg Oral Daily   gabapentin  800 mg Oral TID   heparin  5,000 Units Subcutaneous Q8H   Continuous Infusions:  ceFEPime (MAXIPIME) IV Stopped (09/12/22 1318)  metronidazole Stopped (09/12/22 1004)   vancomycin     PRN Meds:.acetaminophen, hydrALAZINE, ipratropium-albuterol, morphine injection, nitroGLYCERIN, ondansetron (ZOFRAN) IV   Anti-infectives (From admission, onward)    Start     Dose/Rate Route Frequency Ordered Stop   09/12/22 2300  vancomycin (VANCOREADY) IVPB 1250 mg/250 mL        1,250 mg 166.7 mL/hr over 90 Minutes Intravenous Every 24 hours 09/11/22 2153     09/11/22 2245  vancomycin (VANCOREADY) IVPB 2000 mg/400 mL        2,000 mg 200 mL/hr over 120 Minutes  Intravenous  Once 09/11/22 2151 09/12/22 0159   09/11/22 2230  ceFEPIme (MAXIPIME) 2 g in sodium chloride 0.9 % 100 mL IVPB        2 g 200 mL/hr over 30 Minutes Intravenous Every 12 hours 09/11/22 2150     09/11/22 2200  metroNIDAZOLE (FLAGYL) IVPB 500 mg        500 mg 100 mL/hr over 60 Minutes Intravenous Every 12 hours 09/11/22 2142     09/11/22 1600  cefTRIAXone (ROCEPHIN) 1 g in sodium chloride 0.9 % 100 mL IVPB        1 g 200 mL/hr over 30 Minutes Intravenous  Once 09/11/22 1545 09/11/22 1644        Subjective: Lorita Officer today has no fevers, no emesis,     -Voiding well, no significant dyspnea -No further chest pains at this time  Discussed with patient's daughter [name] and patient's brother at bedside  Objective: Vitals:   09/12/22 0002 09/12/22 0406 09/12/22 0527 09/12/22 1236  BP: (!) 163/88 (!) 145/71  (!) 158/91  Pulse: 82  77 84  Resp: (!) 24 (!) 22  16  Temp: 99.5 F (37.5 C) 99.6 F (37.6 C)  98.6 F (37 C)  TempSrc: Oral Oral  Oral  SpO2: 96% 95% 98% 98%  Weight:      Height:        Intake/Output Summary (Last 24 hours) at 09/12/2022 2009 Last data filed at 09/12/2022 1854 Gross per 24 hour  Intake 1213.07 ml  Output --  Net 1213.07 ml   Filed Weights   09/11/22 2005  Weight: 118.2 kg    Physical Exam  Gen:- Awake Alert,  in no apparent distress  HEENT:- [place].AT, No sclera icterus Neck-Supple Neck,No JVD,.  Lungs-  CTAB , fair symmetrical air movement CV- S1, S2 normal, regular  Abd-  +ve B.Sounds, Abd Soft, No tenderness,    Extremity/Skin:- No  edema, pedal pulses present  Psych-affect is appropriate, oriented x3 Neuro-no new focal deficits, no tremors  Data Reviewed: I have personally reviewed following labs and imaging studies  CBC: Recent Labs  Lab 09/11/22 1614 09/12/22 0410  WBC 5.6 5.1  HGB 13.9 12.9*  HCT 42.5 39.6  MCV 84.7 85.5  PLT 128* 109*   Basic Metabolic Panel: Recent Labs  Lab 09/11/22 1614  09/12/22 0410  NA 135 136  K 3.5 3.8  CL 102 105  CO2 22 21*  GLUCOSE 154* 150*  BUN 23* 25*  CREATININE 2.10* 2.60*  CALCIUM 8.4* 7.9*   GFR: Estimated Creatinine Clearance: 38.3 mL/min (A) (by C-G formula based on SCr of 2.6 mg/dL (H)). Liver Function Tests: Recent Labs  Lab 09/11/22 1614  AST 29  ALT 21  ALKPHOS 76  BILITOT 1.0  PROT 6.8  ALBUMIN 3.9   Cardiac Enzymes: Recent Labs  Lab 09/11/22 1614  CKTOTAL 98   Recent Results (from the past 240  hour(s))  Blood Culture (routine x 2)     Status: None (Preliminary result)   Collection Time: 09/11/22  4:14 PM   Specimen: BLOOD  Result Value Ref Range Status   Specimen Description BLOOD BLOOD RIGHT ARM  Final   Special Requests   Final    BOTTLES DRAWN AEROBIC AND ANAEROBIC Blood Culture adequate volume   Culture   Final    NO GROWTH < 12 HOURS Performed at [facility], [address] [place], [place] [address]    Report Status PENDING  Incomplete  Blood Culture (routine x 2)     Status: None (Preliminary result)   Collection Time: 09/11/22  4:14 PM   Specimen: BLOOD  Result Value Ref Range Status   Specimen Description BLOOD LEFT ANTECUBITAL  Final   Special Requests   Final    BOTTLES DRAWN AEROBIC AND ANAEROBIC Blood Culture adequate volume   Culture   Final    NO GROWTH < 12 HOURS Performed at [facility], [address] [place], [place] [address]    Report Status PENDING  Incomplete  Resp panel by RT-PCR (RSV, Flu A&B, Covid) Urine, Clean Catch     Status: None   Collection Time: 09/11/22  5:18 PM   Specimen: Urine, Clean Catch; Nasal Swab  Result Value Ref Range Status   SARS Coronavirus 2 by RT PCR NEGATIVE NEGATIVE Final    Comment: (NOTE) SARS-CoV-2 target nucleic acids are NOT DETECTED.  The SARS-CoV-2 RNA is generally detectable in upper respiratory specimens during the acute phase of infection. The lowest concentration of SARS-CoV-2 viral copies this assay can detect is 138 copies/mL. A  negative result does not preclude SARS-Cov-2 infection and should not be used as the sole basis for treatment or other patient management decisions. A negative result may occur with  improper specimen collection/handling, submission of specimen other than nasopharyngeal swab, presence of viral mutation(s) within the areas targeted by this assay, and inadequate number of viral copies(<138 copies/mL). A negative result must be combined with clinical observations, patient history, and epidemiological information. The expected result is Negative.  Fact Sheet for Patients:  BloggerCourse.com  Fact Sheet for Healthcare Providers:  SeriousBroker.it  This test is no t yet approved or cleared by the [place] FDA and  has been authorized for detection and/or diagnosis of SARS-CoV-2 by FDA under an Emergency Use Authorization (EUA). This EUA will remain  in effect (meaning this test can be used) for the duration of the COVID-19 declaration under Section 564(b)(1) of the Act, 21 U.S.C.section 360bbb-3(b)(1), unless the authorization is terminated  or revoked sooner.       Influenza A by PCR NEGATIVE NEGATIVE Final   Influenza B by PCR NEGATIVE NEGATIVE Final    Comment: (NOTE) The Xpert Xpress SARS-CoV-2/FLU/RSV plus assay is intended as an aid in the diagnosis of influenza from Nasopharyngeal swab specimens and should not be used as a sole basis for treatment. Nasal washings and aspirates are unacceptable for Xpert Xpress SARS-CoV-2/FLU/RSV testing.  Fact Sheet for Patients: BloggerCourse.com  Fact Sheet for Healthcare Providers: SeriousBroker.it  This test is not yet approved or cleared by the [place] FDA and has been authorized for detection and/or diagnosis of SARS-CoV-2 by FDA under an Emergency Use Authorization (EUA). This EUA will remain in effect (meaning this test can  be used) for the duration of the COVID-19 declaration under Section 564(b)(1) of the Act, 21 U.S.C. section 360bbb-3(b)(1), unless the authorization is terminated or revoked.  Resp Syncytial Virus by PCR NEGATIVE NEGATIVE Final    Comment: (NOTE) Fact Sheet for Patients: BloggerCourse.com  Fact Sheet for Healthcare Providers: SeriousBroker.it  This test is not yet approved or cleared by the [place] FDA and has been authorized for detection and/or diagnosis of SARS-CoV-2 by FDA under an Emergency Use Authorization (EUA). This EUA will remain in effect (meaning this test can be used) for the duration of the COVID-19 declaration under Section 564(b)(1) of the Act, 21 U.S.C. section 360bbb-3(b)(1), unless the authorization is terminated or revoked.  Performed at [facility], [address], [place], [place] [address]     Radiology Studies: US ARTERIAL ABI (SCREENING LOWER EXTREMITY)  Result Date: 09/12/2022 CLINICAL DATA:  242300 Bilateral leg pain 242300 EXAM: NONINVASIVE PHYSIOLOGIC VASCULAR STUDY OF BILATERAL LOWER EXTREMITIES TECHNIQUE: Evaluation of both lower extremities were performed at rest, including calculation of ankle-brachial indices with single level Doppler, pressure and pulse volume recording. COMPARISON:  None Available. FINDINGS: Right ABI:  1.42 Left ABI:  1.45 Right Lower Extremity: Normal arterial waveforms at the posterior tibial artery. Dampened more monophasic waveforms of the dorsalis pedis artery. Irregular heart rate is noted. Left Lower Extremity: Normal triphasic/biphasic waveforms at the ankle. Irregular heart rate noted. IMPRESSION: 1. Bilateral lower extremity ABIs greater than 1.4 are nondiagnostic secondary to incompressible vessel calcifications. 2. Somewhat dampened arterial waveforms are noted at the right dorsalis pedis artery. 3. Irregular heart rate is noted on arterial waveforms examination.  Correlate for atrial fibrillation. Electronically Signed   By: Olive Bass M.D.   On: 09/12/2022 10:52   ECHOCARDIOGRAM COMPLETE  Result Date: 09/12/2022    ECHOCARDIOGRAM REPORT   Patient Name:   [name] Nordhoff Date of Exam: 09/12/2022 Medical Rec #:  [id number]       Height:       69.0 in Accession #:    [id number]      Weight:       260.6 lb Date of Birth:  1960/12/15        BSA:          2.312 m Patient Age:    62 years        BP:           145/71 mmHg Patient Gender: M               HR:           73 bpm. [facility] Procedure: 2D Echo, Color Doppler, Cardiac Doppler and Intracardiac            Opacification Agent Indications:     Chest Pain R07.9  History:         Patient has prior history of Echocardiogram examinations, most                  recent 06/20/2019. CAD and Previous Myocardial Infarction,                  Signs/Symptoms:Chest Pain, Fever and Shortness of Breath; Risk                  Factors:Dyslipidemia, Hypertension and Former Smoker.  Sonographer:     Aron Baba Referring Phys:  [id number] Onnie Boer Diagnosing Phys: Winfield Rast Mallipeddi  Sonographer Comments: Technically difficult study due to poor echo windows. Image acquisition challenging due to COPD. IMPRESSIONS  1. Left ventricular ejection fraction, by estimation, is 55 to 60%. The left ventricle has normal function. The left  ventricle has no regional wall motion abnormalities. There is mild left ventricular hypertrophy. Left ventricular diastolic parameters are indeterminate.  2. Right ventricular systolic function is mildly reduced. The right ventricular size is normal. There is normal pulmonary artery systolic pressure.  3. Left atrial size was severely dilated.  4. Right atrial size was moderately dilated.  5. The mitral valve is abnormal. No evidence of mitral valve regurgitation. No evidence of mitral stenosis. Moderate mitral annular calcification.  6. The aortic valve is tricuspid. There is mild  calcification of the aortic valve. Aortic valve regurgitation is not visualized. No aortic stenosis is present.  7. Aortic dilatation noted. There is borderline dilatation of the ascending aorta, measuring 39 mm. Normal aortic root.  8. The inferior vena cava is normal in size with greater than 50% respiratory variability, suggesting right atrial pressure of 3 mmHg. Comparison(s): No significant change from prior study. FINDINGS  Left Ventricle: Left ventricular ejection fraction, by estimation, is 55 to 60%. The left ventricle has normal function. The left ventricle has no regional wall motion abnormalities. Definity contrast agent was given IV to delineate the left ventricular  endocardial borders. The left ventricular internal cavity size was normal in size. There is mild left ventricular hypertrophy. Left ventricular diastolic parameters are indeterminate. Right Ventricle: The right ventricular size is normal. No increase in right ventricular wall thickness. Right ventricular systolic function is mildly reduced. There is normal pulmonary artery systolic pressure. The tricuspid regurgitant velocity is 1.68 m/s, and with an assumed right atrial pressure of 3 mmHg, the estimated right ventricular systolic pressure is 14.3 mmHg. Left Atrium: Left atrial size was severely dilated. Right Atrium: Right atrial size was moderately dilated. Pericardium: There is no evidence of pericardial effusion. Mitral Valve: The mitral valve is abnormal. Moderate mitral annular calcification. No evidence of mitral valve regurgitation. No evidence of mitral valve stenosis. Tricuspid Valve: The tricuspid valve is not well visualized. Tricuspid valve regurgitation is trivial. No evidence of tricuspid stenosis. Aortic Valve: The aortic valve is tricuspid. There is mild calcification of the aortic valve. Aortic valve regurgitation is not visualized. No aortic stenosis is present. Pulmonic Valve: The pulmonic valve was not well visualized.  Pulmonic valve regurgitation is trivial. No evidence of pulmonic stenosis. Aorta: Aortic dilatation noted and the aortic root is normal in size and structure. There is borderline dilatation of the ascending aorta, measuring 39 mm. Venous: The inferior vena cava is normal in size with greater than 50% respiratory variability, suggesting right atrial pressure of 3 mmHg. IAS/Shunts: The interatrial septum was not well visualized.  LEFT VENTRICLE PLAX 2D LVIDd:         5.10 cm   Diastology LVIDs:         3.60 cm   LV e' medial:    5.68 cm/s LV PW:         1.30 cm   LV E/e' medial:  18.7 LV IVS:        1.40 cm   LV e' lateral:   10.30 cm/s LVOT diam:     2.30 cm   LV E/e' lateral: 10.3 LV SV:         68 LV SV Index:   29 LVOT Area:     4.15 cm  RIGHT VENTRICLE RV S prime:     11.40 cm/s TAPSE (M-mode): 1.3 cm LEFT ATRIUM              Index        RIGHT  ATRIUM           Index LA diam:        4.20 cm  1.82 cm/m   RA Area:     28.20 cm LA Vol (A2C):   161.0 ml 69.64 ml/m  RA Volume:   100.00 ml 43.25 ml/m LA Vol (A4C):   154.0 ml 66.61 ml/m LA Biplane Vol: 157.0 ml 67.91 ml/m  AORTIC VALVE LVOT Vmax:   88.85 cm/s LVOT Vmean:  56.400 cm/s LVOT VTI:    0.164 m  AORTA Ao Root diam: 3.30 cm Ao Asc diam:  3.90 cm MITRAL VALVE                TRICUSPID VALVE MV Area (PHT): 5.66 cm     TR Peak grad:   11.3 mmHg MV Decel Time: 134 msec     TR Vmax:        168.00 cm/s MV E velocity: 106.00 cm/s MV A velocity: 55.70 cm/s   SHUNTS MV E/A ratio:  1.90         Systemic VTI:  0.16 m                             Systemic Diam: 2.30 cm Vishnu Priya Mallipeddi Electronically signed by Winfield Rast Mallipeddi Signature Date/Time: 09/12/2022/10:18:03 AM    Final (Updated)    DG Chest 2 View  Result Date: 09/11/2022 CLINICAL DATA:  Cough, shortness of breath EXAM: CHEST - 2 VIEW COMPARISON:  Previous studies including chest radiographs done on 05/31/2016 FINDINGS: Transit of diameter of heart is increased. There is previous coronary  bypass surgery. There are no signs of pulmonary edema or new focal infiltrates. There is no pleural effusion or pneumothorax. IMPRESSION: No active cardiopulmonary disease. Electronically Signed   By: Ernie Avena M.D.   On: 09/11/2022 13:54    Scheduled Meds:  amLODipine  10 mg Oral Daily   aspirin EC  81 mg Oral Daily   atorvastatin  80 mg Oral QHS   clopidogrel  75 mg Oral Daily   gabapentin  800 mg Oral TID   heparin  5,000 Units Subcutaneous Q8H   Continuous Infusions:  ceFEPime (MAXIPIME) IV Stopped (09/12/22 1318)   metronidazole Stopped (09/12/22 1004)   vancomycin      LOS: 0 days   Shon Hale M.D on 09/12/2022 at 8:09 PM  Go to [email or web address] - for contact info  [facility] - Office  [phone]  If 7PM-7AM, please contact night-coverage [email or web address] 09/12/2022, 8:09 PM

## 2022-09-12 NOTE — Progress Notes (Signed)
Patient reported having  headache. Pain score of 8. Patient stated, " I had this headache when I came in and they gave me morphine but I don't want that stuff".  Tylenol given. Plan of care ongoing.

## 2022-09-13 ENCOUNTER — Inpatient Hospital Stay (HOSPITAL_COMMUNITY): Payer: Medicare HMO

## 2022-09-13 DIAGNOSIS — W57XXXA Bitten or stung by nonvenomous insect and other nonvenomous arthropods, initial encounter: Secondary | ICD-10-CM

## 2022-09-13 DIAGNOSIS — S30860A Insect bite (nonvenomous) of lower back and pelvis, initial encounter: Secondary | ICD-10-CM | POA: Diagnosis not present

## 2022-09-13 DIAGNOSIS — R0602 Shortness of breath: Secondary | ICD-10-CM | POA: Diagnosis not present

## 2022-09-13 DIAGNOSIS — R509 Fever, unspecified: Secondary | ICD-10-CM | POA: Diagnosis not present

## 2022-09-13 LAB — RENAL FUNCTION PANEL
Albumin: 3.2 g/dL — ABNORMAL LOW (ref 3.5–5.0)
Anion gap: 7 (ref 5–15)
BUN: 25 mg/dL — ABNORMAL HIGH (ref 6–20)
CO2: 24 mmol/L (ref 22–32)
Calcium: 7.9 mg/dL — ABNORMAL LOW (ref 8.9–10.3)
Chloride: 105 mmol/L (ref 98–111)
Creatinine, Ser: 1.98 mg/dL — ABNORMAL HIGH (ref 0.61–1.24)
GFR, Estimated: 38 mL/min — ABNORMAL LOW (ref >60)
Glucose, Bld: 198 mg/dL — ABNORMAL HIGH (ref 70–99)
Phosphorus: 2.4 mg/dL — ABNORMAL LOW (ref 2.5–4.6)
Potassium: 3.9 mmol/L (ref 3.5–5.1)
Sodium: 136 mmol/L (ref 135–145)

## 2022-09-13 LAB — BASIC METABOLIC PANEL
Anion gap: 9 (ref 5–15)
BUN: 24 mg/dL — ABNORMAL HIGH (ref 6–20)
CO2: 22 mmol/L (ref 22–32)
Calcium: 7.9 mg/dL — ABNORMAL LOW (ref 8.9–10.3)
Chloride: 105 mmol/L (ref 98–111)
Creatinine, Ser: 1.73 mg/dL — ABNORMAL HIGH (ref 0.61–1.24)
GFR, Estimated: 45 mL/min — ABNORMAL LOW (ref >60)
Glucose, Bld: 108 mg/dL — ABNORMAL HIGH (ref 70–99)
Potassium: 3.7 mmol/L (ref 3.5–5.1)
Sodium: 136 mmol/L (ref 135–145)

## 2022-09-13 LAB — CULTURE, BLOOD (ROUTINE X 2)

## 2022-09-13 MED ORDER — DOXYCYCLINE HYCLATE 100 MG PO TABS
100.0000 mg | ORAL_TABLET | Freq: Once | ORAL | Status: AC
  Administered 2022-09-14: 100 mg via ORAL
  Filled 2022-09-13: qty 1

## 2022-09-13 MED ORDER — SODIUM CHLORIDE 0.9 % IV SOLN: INTRAVENOUS | Status: DC

## 2022-09-13 MED ORDER — BUTALBITAL-APAP-CAFFEINE 50-325-40 MG PO TABS
1.0000 | ORAL_TABLET | Freq: Four times a day (QID) | ORAL | Status: DC | PRN
  Administered 2022-09-13 – 2022-09-14 (×2): 1 via ORAL
  Filled 2022-09-13 (×2): qty 1

## 2022-09-13 MED ORDER — BUTALBITAL-APAP-CAFFEINE 50-325-40 MG PO TABS
2.0000 | ORAL_TABLET | Freq: Once | ORAL | Status: AC
  Administered 2022-09-13: 2 via ORAL
  Filled 2022-09-13: qty 2

## 2022-09-13 NOTE — Progress Notes (Addendum)
PROGRESS NOTE     [name], is a 62 y.o. male, DOB - 1961-02-26, ZOX:[id number]  Admit date - 09/11/2022   Admitting Physician Rickelle Sylvestre Mariea Clonts, MD  Outpatient Primary MD for the patient is [name], MD  LOS - 1  Chief Complaint  Patient presents with   Chest Pain        Brief Narrative:  62 y.o. male with medical history significant for COPD, coronary artery disease with CABG in 2010, hypertension, peripheral vascular disease status post bilateral stent placement admitted on 09/11/2022 with FUO, AKI and chest pains   -Assessment and Plan: 1)Fever of unknown origin--POA --Spiking fevers again--Tmax today 101.3 -No leukocytosis - procalcitonin- Elevated at 0.78 on 09/11/2022 -CRP is elevated but sed rate is WNL on 09/11/2022 -Chest x-ray and UA not suggestive of infection -Blood cultures from 09/10/2022 NGTD -Repeat blood cultures requested on 09/13/2022 due to recurrent fevers -In the setting of recurrent/persistent fevers we will continue IV vancomycin cefepime and metronidazole pending culture data - 2)AKI (acute kidney injury) (HCC) Cr elevated 2.1, baseline 1.2-1.5. Creatinine this admission 2.10 >>2.60>> 1.98---renal function improving with hydration and discontinuation of Lasix and losartan -Continue to hold Lasix 20 mg daily, hold losartan 100 mg daily -Continue IV fluids given worsening renal function -Patient is voiding well - renally adjust medications, avoid nephrotoxic agents / dehydration  / hypotension   3)Chest pain Chest pain + dyspnea with typical and atypical features.  In the setting of fever.  Troponin 29 > 30.  EKG/rhythm with frequent PVCs.  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  Follows with cardiology. -Echo from 09/12/2022 with EF of 55 to 60%, no AS and no MS -C/n aspirin, Plavix and Lipitor -IV morphine 2 mg every 4 hours as needed, nitro as needed -Hold off on starting beta-blocker due to concerns  about bradycardia -Cardiology consult appreciated -Patient remains chest pain-free as of 09/13/2022  4)Peripheral vascular disease (HCC) Reports persistent bilateral lower extremity pain.  No suggestion of infection.  Status post iliac stenting.  May be neuropathy, he is on gabapentin. -Bilateral arterial Dopplers -Arterial ultrasound with ABI shows--Bilateral lower extremity ABIs greater than 1.4 are non-diagnostic secondary to incompressible vessel calcifications. And  Somewhat dampened arterial waveforms are noted at the right dorsalis pedis artery. --C/n aspirin, Plavix and Lipitor  5)COPD (chronic obstructive pulmonary disease) (HCC) Stable. -DuoNebs as needed  6)HTN- Stable. C/n  Norvasc, -Hold losartan and Lasix due to AKI  7)Persistent headaches--- patient Now tells me that has had persistent headaches for at least 4 days -No neck pain or neck stiffness, no URI symptoms, no confusion no visual disturbance no dizziness -No focal extremity weakness or numbness and no speech or gait concerns -CT head without acute findings -neck and skin exam reassuring and low clinical index of suspicion for meningitis  Status is: Inpatient   Disposition: The patient is from: Home              Anticipated d/c is to: Home----pending resolution of fevers and culture data              Anticipated d/c date is: 2 days              Patient currently is not medically stable to d/c. Barriers: Not Clinically Stable-   Code Status :  -  Code Status: Full Code   Family Communication:   (patient is alert, awake and coherent)  ,Discussed with patient's daughter  [name] and patient's brother at bedside  DVT Prophylaxis  :   - SCDs  heparin injection 5,000 Units Start: 09/11/22 2230  Lab Results  Component Value Date   PLT 109 (L) 09/12/2022   Inpatient Medications  Scheduled Meds:  amLODipine  10 mg Oral Daily   aspirin EC  81 mg Oral Daily   atorvastatin  80 mg Oral QHS   clopidogrel  75  mg Oral Daily   gabapentin  800 mg Oral TID   heparin  5,000 Units Subcutaneous Q8H   sodium bicarbonate  1,300 mg Oral BID   Continuous Infusions:  sodium chloride 50 mL/hr at 09/13/22 1424   ceFEPime (MAXIPIME) IV 2 g (09/13/22 1041)   metronidazole 500 mg (09/13/22 0921)   vancomycin Stopped (09/13/22 0119)   PRN Meds:.acetaminophen, hydrALAZINE, ipratropium-albuterol, morphine injection, nitroGLYCERIN, ondansetron (ZOFRAN) IV   Anti-infectives (From admission, onward)    Start     Dose/Rate Route Frequency Ordered Stop   09/12/22 2300  vancomycin (VANCOREADY) IVPB 1250 mg/250 mL        1,250 mg 166.7 mL/hr over 90 Minutes Intravenous Every 24 hours 09/11/22 2153     09/11/22 2245  vancomycin (VANCOREADY) IVPB 2000 mg/400 mL        2,000 mg 200 mL/hr over 120 Minutes Intravenous  Once 09/11/22 2151 09/12/22 0159   09/11/22 2230  ceFEPIme (MAXIPIME) 2 g in sodium chloride 0.9 % 100 mL IVPB        2 g 200 mL/hr over 30 Minutes Intravenous Every 12 hours 09/11/22 2150     09/11/22 2200  metroNIDAZOLE (FLAGYL) IVPB 500 mg        500 mg 100 mL/hr over 60 Minutes Intravenous Every 12 hours 09/11/22 2142     09/11/22 1600  cefTRIAXone (ROCEPHIN) 1 g in sodium chloride 0.9 % 100 mL IVPB        1 g 200 mL/hr over 30 Minutes Intravenous  Once 09/11/22 1545 09/11/22 1644       Subjective: [name] today has no emesis,     -Voiding well, no significant dyspnea -No further chest pains at this time - Reports fevers and chills, poor appetite and persistent headaches  Discussed with patient's daughter [name] by phone and patient's brother at bedside  Objective: Vitals:   09/12/22 2106 09/13/22 0300 09/13/22 1338 09/13/22 1519  BP: (!) 123/45 (!) 149/81 138/88   Pulse: 67 74 78   Resp: 18 18 20    Temp: 98.7 F (37.1 C) 98.7 F (37.1 C) (!) 101.3 F (38.5 C) 99.3 F (37.4 C)  TempSrc: Oral Oral Oral Oral  SpO2: 97% 97% 96%   Weight:      Height:         Intake/Output Summary (Last 24 hours) at 09/13/2022 1656 Last data filed at 09/13/2022 1300 Gross per 24 hour  Intake 2292.98 ml  Output --  Net 2292.98 ml   Filed Weights   09/11/22 2005  Weight: 118.2 kg    Physical Exam  Gen:- Awake Alert,  in no apparent distress  HEENT:- [place].AT, No sclera icterus Neck-Supple Neck,No JVD,.  Painless range of motion in the neck Lungs-  CTAB , fair symmetrical air movement CV- S1, S2 normal, regular  Abd-  +ve B.Sounds, Abd Soft, No tenderness, no CVA tenderness Extremity/Skin:- No  edema, pedal pulses present ,  no rash Psych-affect is appropriate, oriented x3, No confusion or encephalopathy Neuro-no new focal deficits, no tremors, negative Brudzinski and negative Kernig  signs -Neurological exam is reassuring  Data Reviewed: I have personally reviewed following labs and imaging studies  CBC: Recent Labs  Lab 09/11/22 1614 09/12/22 0410  WBC 5.6 5.1  HGB 13.9 12.9*  HCT 42.5 39.6  MCV 84.7 85.5  PLT 128* 109*   Basic Metabolic Panel: Recent Labs  Lab 09/11/22 1614 09/12/22 0410 09/13/22 0846 09/13/22 1424  NA 135 136 136 136  K 3.5 3.8 3.9 3.7  CL 102 105 105 105  CO2 22 21* 24 22  GLUCOSE 154* 150* 198* 108*  BUN 23* 25* 25* 24*  CREATININE 2.10* 2.60* 1.98* 1.73*  CALCIUM 8.4* 7.9* 7.9* 7.9*  PHOS  --   --  2.4*  --    GFR: Estimated Creatinine Clearance: 57.6 mL/min (A) (by C-G formula based on SCr of 1.73 mg/dL (H)). Liver Function Tests: Recent Labs  Lab 09/11/22 1614 09/13/22 0846  AST 29  --   ALT 21  --   ALKPHOS 76  --   BILITOT 1.0  --   PROT 6.8  --   ALBUMIN 3.9 3.2*   Cardiac Enzymes: Recent Labs  Lab 09/11/22 1614  CKTOTAL 98   Recent Results (from the past 240 hour(s))  Blood Culture (routine x 2)     Status: None (Preliminary result)   Collection Time: 09/11/22  4:14 PM   Specimen: BLOOD  Result Value Ref Range Status   Specimen Description BLOOD BLOOD RIGHT ARM  Final   Special  Requests   Final    BOTTLES DRAWN AEROBIC AND ANAEROBIC Blood Culture adequate volume   Culture   Final    NO GROWTH 2 DAYS Performed at [facility], [address], [place], [place] [address]    Report Status PENDING  Incomplete  Blood Culture (routine x 2)     Status: None (Preliminary result)   Collection Time: 09/11/22  4:14 PM   Specimen: BLOOD  Result Value Ref Range Status   Specimen Description BLOOD LEFT ANTECUBITAL  Final   Special Requests   Final    BOTTLES DRAWN AEROBIC AND ANAEROBIC Blood Culture adequate volume   Culture   Final    NO GROWTH 2 DAYS Performed at [facility], [address] [place], [place] [address]    Report Status PENDING  Incomplete  Resp panel by RT-PCR (RSV, Flu A&B, Covid) Urine, Clean Catch     Status: None   Collection Time: 09/11/22  5:18 PM   Specimen: Urine, Clean Catch; Nasal Swab  Result Value Ref Range Status   SARS Coronavirus 2 by RT PCR NEGATIVE NEGATIVE Final    Comment: (NOTE) SARS-CoV-2 target nucleic acids are NOT DETECTED.  The SARS-CoV-2 RNA is generally detectable in upper respiratory specimens during the acute phase of infection. The lowest concentration of SARS-CoV-2 viral copies this assay can detect is 138 copies/mL. A negative result does not preclude SARS-Cov-2 infection and should not be used as the sole basis for treatment or other patient management decisions. A negative result may occur with  improper specimen collection/handling, submission of specimen other than nasopharyngeal swab, presence of viral mutation(s) within the areas targeted by this assay, and inadequate number of viral copies(<138 copies/mL). A negative result must be combined with clinical observations, patient history, and epidemiological information. The expected result is Negative.  Fact Sheet for Patients:  BloggerCourse.com  Fact Sheet for Healthcare Providers:  SeriousBroker.it  This  test is no t yet approved or cleared by the [place] and  has been authorized for detection and/or diagnosis of SARS-CoV-2 by FDA under an Emergency Use Authorization (EUA). This EUA will remain  in effect (meaning this test can be used) for the duration of the COVID-19 declaration under Section 564(b)(1) of the Act, 21 U.S.C.section 360bbb-3(b)(1), unless the authorization is terminated  or revoked sooner.       Influenza A by PCR NEGATIVE NEGATIVE Final   Influenza B by PCR NEGATIVE NEGATIVE Final    Comment: (NOTE) The Xpert Xpress SARS-CoV-2/FLU/RSV plus assay is intended as an aid in the diagnosis of influenza from Nasopharyngeal swab specimens and should not be used as a sole basis for treatment. Nasal washings and aspirates are unacceptable for Xpert Xpress SARS-CoV-2/FLU/RSV testing.  Fact Sheet for Patients: BloggerCourse.com  Fact Sheet for Healthcare Providers: SeriousBroker.it  This test is not yet approved or cleared by the [place] FDA and has been authorized for detection and/or diagnosis of SARS-CoV-2 by FDA under an Emergency Use Authorization (EUA). This EUA will remain in effect (meaning this test can be used) for the duration of the COVID-19 declaration under Section 564(b)(1) of the Act, 21 U.S.C. section 360bbb-3(b)(1), unless the authorization is terminated or revoked.     Resp Syncytial Virus by PCR NEGATIVE NEGATIVE Final    Comment: (NOTE) Fact Sheet for Patients: BloggerCourse.com  Fact Sheet for Healthcare Providers: SeriousBroker.it  This test is not yet approved or cleared by the [place] FDA and has been authorized for detection and/or diagnosis of SARS-CoV-2 by FDA under an Emergency Use Authorization (EUA). This EUA will remain in effect (meaning this test can be used) for the duration of the COVID-19 declaration under  Section 564(b)(1) of the Act, 21 U.S.C. section 360bbb-3(b)(1), unless the authorization is terminated or revoked.  Performed at [facility], [address] [place], [place] [address]   Culture, blood (Routine X 2) w Reflex to ID Panel     Status: None (Preliminary result)   Collection Time: 09/13/22  2:24 PM   Specimen: Left Antecubital; Blood  Result Value Ref Range Status   Specimen Description   Final    LEFT ANTECUBITAL BOTTLES DRAWN AEROBIC AND ANAEROBIC   Special Requests   Final    Blood Culture adequate volume Performed at [facility], [address], [place], [place] [address]    Culture PENDING  Incomplete   Report Status PENDING  Incomplete  Culture, blood (Routine X 2) w Reflex to ID Panel     Status: None (Preliminary result)   Collection Time: 09/13/22  2:24 PM   Specimen: BLOOD LEFT HAND  Result Value Ref Range Status   Specimen Description   Final    BLOOD LEFT HAND BOTTLES DRAWN AEROBIC AND ANAEROBIC   Special Requests   Final    Blood Culture adequate volume Performed at [facility], [address], [place], [place] [address]    Culture PENDING  Incomplete   Report Status PENDING  Incomplete    Radiology Studies: US ARTERIAL ABI (SCREENING LOWER EXTREMITY)  Result Date: 09/12/2022 CLINICAL DATA:  242300 Bilateral leg pain 242300 EXAM: NONINVASIVE PHYSIOLOGIC VASCULAR STUDY OF BILATERAL LOWER EXTREMITIES TECHNIQUE: Evaluation of both lower extremities were performed at rest, including calculation of ankle-brachial indices with single level Doppler, pressure and pulse volume recording. COMPARISON:  None Available. FINDINGS: Right ABI:  1.42 Left ABI:  1.45 Right Lower Extremity: Normal arterial waveforms at the posterior tibial artery. Dampened more monophasic waveforms of the dorsalis pedis artery. Irregular heart rate is  noted. Left Lower Extremity: Normal triphasic/biphasic waveforms at the ankle. Irregular heart rate noted. IMPRESSION: 1. Bilateral lower  extremity ABIs greater than 1.4 are nondiagnostic secondary to incompressible vessel calcifications. 2. Somewhat dampened arterial waveforms are noted at the right dorsalis pedis artery. 3. Irregular heart rate is noted on arterial waveforms examination. Correlate for atrial fibrillation. Electronically Signed   By: Olive Bass M.D.   On: 09/12/2022 10:52   ECHOCARDIOGRAM COMPLETE  Result Date: 09/12/2022    ECHOCARDIOGRAM REPORT   Patient Name:   SAEED ALLEYNE Fleury Date of Exam: 09/12/2022 Medical Rec #:  [id number]       Height:       69.0 in Accession #:    [id number]      Weight:       260.6 lb Date of Birth:  06/12/1960        BSA:          2.312 m Patient Age:    62 years        BP:           145/71 mmHg Patient Gender: M               HR:           73 bpm. [facility] Procedure: 2D Echo, Color Doppler, Cardiac Doppler and Intracardiac            Opacification Agent Indications:     Chest Pain R07.9  History:         Patient has prior history of Echocardiogram examinations, most                  recent 06/20/2019. CAD and Previous Myocardial Infarction,                  Signs/Symptoms:Chest Pain, Fever and Shortness of Breath; Risk                  Factors:Dyslipidemia, Hypertension and Former Smoker.  Sonographer:     Aron Baba Referring Phys:  [id number] Onnie Boer Diagnosing Phys: Winfield Rast Mallipeddi  Sonographer Comments: Technically difficult study due to poor echo windows. Image acquisition challenging due to COPD. IMPRESSIONS  1. Left ventricular ejection fraction, by estimation, is 55 to 60%. The left ventricle has normal function. The left ventricle has no regional wall motion abnormalities. There is mild left ventricular hypertrophy. Left ventricular diastolic parameters are indeterminate.  2. Right ventricular systolic function is mildly reduced. The right ventricular size is normal. There is normal pulmonary artery systolic pressure.  3. Left atrial size was severely  dilated.  4. Right atrial size was moderately dilated.  5. The mitral valve is abnormal. No evidence of mitral valve regurgitation. No evidence of mitral stenosis. Moderate mitral annular calcification.  6. The aortic valve is tricuspid. There is mild calcification of the aortic valve. Aortic valve regurgitation is not visualized. No aortic stenosis is present.  7. Aortic dilatation noted. There is borderline dilatation of the ascending aorta, measuring 39 mm. Normal aortic root.  8. The inferior vena cava is normal in size with greater than 50% respiratory variability, suggesting right atrial pressure of 3 mmHg. Comparison(s): No significant change from prior study. FINDINGS  Left Ventricle: Left ventricular ejection fraction, by estimation, is 55 to 60%. The left ventricle has normal function. The left ventricle has no regional wall motion abnormalities. Definity contrast agent was given IV to delineate the left ventricular  endocardial borders. The  left ventricular internal cavity size was normal in size. There is mild left ventricular hypertrophy. Left ventricular diastolic parameters are indeterminate. Right Ventricle: The right ventricular size is normal. No increase in right ventricular wall thickness. Right ventricular systolic function is mildly reduced. There is normal pulmonary artery systolic pressure. The tricuspid regurgitant velocity is 1.68 m/s, and with an assumed right atrial pressure of 3 mmHg, the estimated right ventricular systolic pressure is 14.3 mmHg. Left Atrium: Left atrial size was severely dilated. Right Atrium: Right atrial size was moderately dilated. Pericardium: There is no evidence of pericardial effusion. Mitral Valve: The mitral valve is abnormal. Moderate mitral annular calcification. No evidence of mitral valve regurgitation. No evidence of mitral valve stenosis. Tricuspid Valve: The tricuspid valve is not well visualized. Tricuspid valve regurgitation is trivial. No evidence of  tricuspid stenosis. Aortic Valve: The aortic valve is tricuspid. There is mild calcification of the aortic valve. Aortic valve regurgitation is not visualized. No aortic stenosis is present. Pulmonic Valve: The pulmonic valve was not well visualized. Pulmonic valve regurgitation is trivial. No evidence of pulmonic stenosis. Aorta: Aortic dilatation noted and the aortic root is normal in size and structure. There is borderline dilatation of the ascending aorta, measuring 39 mm. Venous: The inferior vena cava is normal in size with greater than 50% respiratory variability, suggesting right atrial pressure of 3 mmHg. IAS/Shunts: The interatrial septum was not well visualized.  LEFT VENTRICLE PLAX 2D LVIDd:         5.10 cm   Diastology LVIDs:         3.60 cm   LV e' medial:    5.68 cm/s LV PW:         1.30 cm   LV E/e' medial:  18.7 LV IVS:        1.40 cm   LV e' lateral:   10.30 cm/s LVOT diam:     2.30 cm   LV E/e' lateral: 10.3 LV SV:         68 LV SV Index:   29 LVOT Area:     4.15 cm  RIGHT VENTRICLE RV S prime:     11.40 cm/s TAPSE (M-mode): 1.3 cm LEFT ATRIUM              Index        RIGHT ATRIUM           Index LA diam:        4.20 cm  1.82 cm/m   RA Area:     28.20 cm LA Vol (A2C):   161.0 ml 69.64 ml/m  RA Volume:   100.00 ml 43.25 ml/m LA Vol (A4C):   154.0 ml 66.61 ml/m LA Biplane Vol: 157.0 ml 67.91 ml/m  AORTIC VALVE LVOT Vmax:   88.85 cm/s LVOT Vmean:  56.400 cm/s LVOT VTI:    0.164 m  AORTA Ao Root diam: 3.30 cm Ao Asc diam:  3.90 cm MITRAL VALVE                TRICUSPID VALVE MV Area (PHT): 5.66 cm     TR Peak grad:   11.3 mmHg MV Decel Time: 134 msec     TR Vmax:        168.00 cm/s MV E velocity: 106.00 cm/s MV A velocity: 55.70 cm/s   SHUNTS MV E/A ratio:  1.90         Systemic VTI:  0.16 m  Systemic Diam: 2.30 cm Vishnu Priya Mallipeddi Electronically signed by Winfield Rast Mallipeddi Signature Date/Time: 09/12/2022/10:18:03 AM    Final (Updated)     Scheduled  Meds:  amLODipine  10 mg Oral Daily   aspirin EC  81 mg Oral Daily   atorvastatin  80 mg Oral QHS   clopidogrel  75 mg Oral Daily   gabapentin  800 mg Oral TID   heparin  5,000 Units Subcutaneous Q8H   sodium bicarbonate  1,300 mg Oral BID   Continuous Infusions:  sodium chloride 50 mL/hr at 09/13/22 1424   ceFEPime (MAXIPIME) IV 2 g (09/13/22 1041)   metronidazole 500 mg (09/13/22 1610)   vancomycin Stopped (09/13/22 0119)    LOS: 1 day   Shon Hale M.D on 09/13/2022 at 4:56 PM  Go to [email or web address] - for contact info  [facility] - Office  [phone]  If 7PM-7AM, please contact night-coverage [email or web address] 09/13/2022, 4:56 PM

## 2022-09-13 NOTE — Progress Notes (Signed)
Patient slept most of the night. Patient was up independently to the restroom during the night. Continues to be monitored by the centralized telemetry department with heart rhythm of sinus rhythm with unifocal pvs' and occasional ventricular bigeminy. Patient expressed no complaints of chest pain or discomfort. Plan of care ongoing.

## 2022-09-13 NOTE — Progress Notes (Signed)
Progress Note  RN called due to patient complaining of shortness of breath.  At bedside, patient was not in any acute distress.  Patient was able to complete sentences without any shortness of breath.  He states that he was bitten by a tick about a week ago. Chest x-ray done showed no acute intrathoracic process  Physical Exam  BP (!) 132/108 (BP Location: Left Arm)   Pulse 82   Temp 98.3 F (36.8 C) (Oral)   Resp 18   Ht 5\' 9"  (1.753 m)   Wt 118.2 kg   SpO2 99%   BMI 38.48 kg/m   Gen:- Awake Alert, oriented x 3 and in no acute distress HEENT:- [place].AT, No sclera icterus Neck-Supple Neck,No JVD,.  Lungs-  CTAB, no wheezing CV- S1, S2 normal Abd-  +ve B.Sounds, Abd Soft, No tenderness,    Extremity/Skin:- No  edema, skin warm and dry Psych-affect is appropriate, oriented x3 Neuro-no new focal deficits, no tremors  Assessment and plan Reported shortness of breath Patient's O2 sat was 99% on room air and he was in no acute distress at bedside.  Clear breath sounds on auscultation.  Chest x-ray showed no acute intrathoracic process.  Reported history of tick bite Lyme disease DNA by PCR Western blot pending Empiric Doxycycline x 1 was giving pending Lab result  Total time:  25 minutes This includes time reviewing the chart including progress notes, labs, EKGs, taking medical decisions, ordering labs and documenting findings.

## 2022-09-13 NOTE — Progress Notes (Signed)
Requested respiratory to assess patients breathing.

## 2022-09-14 DIAGNOSIS — R509 Fever, unspecified: Secondary | ICD-10-CM | POA: Diagnosis not present

## 2022-09-14 LAB — BASIC METABOLIC PANEL
Anion gap: 9 (ref 5–15)
BUN: 24 mg/dL — ABNORMAL HIGH (ref 6–20)
CO2: 22 mmol/L (ref 22–32)
Calcium: 7.7 mg/dL — ABNORMAL LOW (ref 8.9–10.3)
Chloride: 106 mmol/L (ref 98–111)
Creatinine, Ser: 1.69 mg/dL — ABNORMAL HIGH (ref 0.61–1.24)
GFR, Estimated: 46 mL/min — ABNORMAL LOW (ref >60)
Glucose, Bld: 106 mg/dL — ABNORMAL HIGH (ref 70–99)
Potassium: 3.8 mmol/L (ref 3.5–5.1)
Sodium: 137 mmol/L (ref 135–145)

## 2022-09-14 LAB — CBC
HCT: 38.1 % — ABNORMAL LOW (ref 39.0–52.0)
Hemoglobin: 12.3 g/dL — ABNORMAL LOW (ref 13.0–17.0)
MCH: 27.6 pg (ref 26.0–34.0)
MCHC: 32.3 g/dL (ref 30.0–36.0)
MCV: 85.4 fL (ref 80.0–100.0)
Platelets: 63 10*3/uL — ABNORMAL LOW (ref 150–400)
RBC: 4.46 MIL/uL (ref 4.22–5.81)
RDW: 13.6 % (ref 11.5–15.5)
WBC: 2.5 10*3/uL — ABNORMAL LOW (ref 4.0–10.5)
nRBC: 0 % (ref 0.0–0.2)

## 2022-09-14 LAB — CULTURE, BLOOD (ROUTINE X 2)

## 2022-09-14 MED ORDER — SODIUM BICARBONATE 650 MG PO TABS
650.0000 mg | ORAL_TABLET | Freq: Two times a day (BID) | ORAL | Status: DC
  Administered 2022-09-14 – 2022-09-15 (×2): 650 mg via ORAL
  Filled 2022-09-14 (×2): qty 1

## 2022-09-14 MED ORDER — DOXYCYCLINE HYCLATE 100 MG PO TABS
100.0000 mg | ORAL_TABLET | Freq: Two times a day (BID) | ORAL | Status: DC
  Administered 2022-09-14 – 2022-09-15 (×3): 100 mg via ORAL
  Filled 2022-09-14 (×3): qty 1

## 2022-09-14 MED ORDER — SODIUM CHLORIDE 0.9 % IV SOLN
2.0000 g | Freq: Two times a day (BID) | INTRAVENOUS | Status: DC
  Administered 2022-09-14 – 2022-09-15 (×2): 2 g via INTRAVENOUS
  Filled 2022-09-14 (×2): qty 12.5

## 2022-09-14 MED ORDER — SODIUM CHLORIDE 0.9 % IV SOLN: 2.0000 g | Freq: Three times a day (TID) | INTRAVENOUS | Status: DC

## 2022-09-14 NOTE — Progress Notes (Addendum)
PROGRESS NOTE     [name], is a 62 y.o. male, DOB - 1960-10-28, ZOX:[id number]  Admit date - 09/11/2022   Admitting Physician Kenlea Woodell Mariea Clonts, MD  Outpatient Primary MD for the patient is [name], MD  LOS - 2  Chief Complaint  Patient presents with   Chest Pain       Brief Narrative:  62 y.o. male with medical history significant for COPD, coronary artery disease with CABG in 2010, hypertension, peripheral vascular disease status post bilateral stent placement admitted on 09/11/2022 with FUO, AKI and chest pains   -Assessment and Plan: 1)Fever of unknown origin--POA --Spiking fevers again--Tmax today 101.3 -No leukocytosis - procalcitonin- Elevated at 0.78 on 09/11/2022 -CRP is elevated but sed rate is WNL on 09/11/2022 -Chest x-ray and UA not suggestive of infection -Blood cultures from 09/10/2022 NGTD -Repeat blood cultures requested on 09/13/2022 due to recurrent fevers -In the setting of recurrent/persistent fevers we will continue IV vancomycin cefepime pending culture data -Okay to stop metronidazole -Doxycycline added due to history of tick bite -Patient now reports Tick Bite a couple of  weeks ago-tick bite area x 2 noted--they appear to have healed without any bull's-eye type findings -test for Lyme disease, RMSF and Ehrlichia requested - 2)AKI (acute kidney injury) (HCC) Cr elevated 2.1, baseline 1.2-1.5. Creatinine this admission 2.10 >>2.60>> 1.98>> 1.69---renal function improving with hydration and discontinuation of Lasix and losartan -Continue to hold Lasix 20 mg daily, hold losartan 100 mg daily -Patient is voiding well - renally adjust medications, avoid nephrotoxic agents / dehydration  / hypotension  3)Chest pain Chest pain + dyspnea with typical and atypical features.  In the setting of fever.  Troponin 29 > 30.  EKG/rhythm with frequent PVCs.  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  Follows with  cardiology. -Echo from 09/12/2022 with EF of 55 to 60%, no AS and no MS -C/n aspirin, Plavix and Lipitor -IV morphine 2 mg every 4 hours as needed, nitro as needed -Hold off on starting beta-blocker due to concerns about bradycardia -Cardiology consult appreciated -Patient remains chest pain-free as of 09/13/2022  4)Peripheral vascular disease (HCC) Reports persistent bilateral lower extremity pain.  No suggestion of infection.  Status post iliac stenting.  May be neuropathy, he is on gabapentin. -Bilateral arterial Dopplers -Arterial ultrasound with ABI shows--Bilateral lower extremity ABIs greater than 1.4 are non-diagnostic secondary to incompressible vessel calcifications. And  Somewhat dampened arterial waveforms are noted at the right dorsalis pedis artery. --C/n aspirin, Plavix and Lipitor  5)COPD (chronic obstructive pulmonary disease) (HCC) Stable. -DuoNebs as needed  6)HTN- Stable. C/n  Norvasc, -Hold losartan and Lasix due to AKI  7)Persistent headaches--- patient Now tells me that has had persistent headaches for at least 4 days -No neck pain or neck stiffness, no URI symptoms, no confusion no visual disturbance no dizziness -No focal extremity weakness or numbness and no speech or gait concerns -CT head without acute findings -neck and skin exam reassuring and low clinical index of suspicion for meningitis --Patient now reports Tick Bite a couple of  weeks ago----tick bite area x 2 noted--they appear to have healed without any bull's-eye type findings -test for Lyme disease, RMSF and Ehrlichia requested - 8)Pancytopenia--this may be infection related.... Platelets trending down, anemia and leukopenia noted -Subcu heparin discontinued-due to platelet drop   Status is: Inpatient   Disposition: The patient is from: Home  Anticipated d/c is to: Home----pending resolution of fevers and culture data              Anticipated d/c date is: 2 days               Patient currently is not medically stable to d/c. Barriers: Not Clinically Stable-   Code Status :  -  Code Status: Full Code   Family Communication:   (patient is alert, awake and coherent)  ,Discussed with patient's daughter Lanora Manis and patient's brother at bedside  DVT Prophylaxis  :   - SCDs  Place and maintain sequential compression device Start: 09/14/22 0829 Place TED hose Start: 09/14/22 0829  Lab Results  Component Value Date   PLT 63 (L) 09/14/2022   Inpatient Medications  Scheduled Meds:  amLODipine  10 mg Oral Daily   aspirin EC  81 mg Oral Daily   atorvastatin  80 mg Oral QHS   clopidogrel  75 mg Oral Daily   doxycycline  100 mg Oral Q12H   gabapentin  800 mg Oral TID   sodium bicarbonate  650 mg Oral BID   Continuous Infusions:  ceFEPime (MAXIPIME) IV     vancomycin Stopped (09/14/22 0150)   PRN Meds:.acetaminophen, butalbital-acetaminophen-caffeine, hydrALAZINE, ipratropium-albuterol, morphine injection, nitroGLYCERIN, ondansetron (ZOFRAN) IV   Anti-infectives (From admission, onward)    Start     Dose/Rate Route Frequency Ordered Stop   09/14/22 2000  ceFEPIme (MAXIPIME) 2 g in sodium chloride 0.9 % 100 mL IVPB        2 g 200 mL/hr over 30 Minutes Intravenous Every 12 hours 09/14/22 1357     09/14/22 1600  ceFEPIme (MAXIPIME) 2 g in sodium chloride 0.9 % 100 mL IVPB  Status:  Discontinued        2 g 200 mL/hr over 30 Minutes Intravenous Every 8 hours 09/14/22 1356 09/14/22 1357   09/14/22 1145  doxycycline (VIBRA-TABS) tablet 100 mg        100 mg Oral Every 12 hours 09/14/22 1053     09/14/22 0045  doxycycline (VIBRA-TABS) tablet 100 mg        100 mg Oral  Once 09/13/22 2353 09/14/22 0021   09/12/22 2300  vancomycin (VANCOREADY) IVPB 1250 mg/250 mL        1,250 mg 166.7 mL/hr over 90 Minutes Intravenous Every 24 hours 09/11/22 2153     09/11/22 2245  vancomycin (VANCOREADY) IVPB 2000 mg/400 mL        2,000 mg 200 mL/hr over 120 Minutes Intravenous   Once 09/11/22 2151 09/12/22 0159   09/11/22 2230  ceFEPIme (MAXIPIME) 2 g in sodium chloride 0.9 % 100 mL IVPB  Status:  Discontinued        2 g 200 mL/hr over 30 Minutes Intravenous Every 12 hours 09/11/22 2150 09/14/22 1356   09/11/22 2200  metroNIDAZOLE (FLAGYL) IVPB 500 mg  Status:  Discontinued        500 mg 100 mL/hr over 60 Minutes Intravenous Every 12 hours 09/11/22 2142 09/14/22 1053   09/11/22 1600  cefTRIAXone (ROCEPHIN) 1 g in sodium chloride 0.9 % 100 mL IVPB        1 g 200 mL/hr over 30 Minutes Intravenous  Once 09/11/22 1545 09/11/22 1644       Subjective: [name] today has no emesis,     -Voiding well, no significant dyspnea -No further chest pains at this time - Reports fevers and chills, poor appetite and persistent headaches  Discussed  with patient's daughter Lanora Manis by phone and patient's brother at bedside  Objective: Vitals:   09/14/22 0024 09/14/22 0314 09/14/22 0535 09/14/22 1355  BP: (!) 127/58 94/64 (!) 123/59 129/69  Pulse:  65  74  Resp:  18  17  Temp:  98.3 F (36.8 C)  100.2 F (37.9 C)  TempSrc:  Oral  Oral  SpO2:  97%  95%  Weight:      Height:        Intake/Output Summary (Last 24 hours) at 09/14/2022 1827 Last data filed at 09/14/2022 1729 Gross per 24 hour  Intake 1301.56 ml  Output --  Net 1301.56 ml   Filed Weights   09/11/22 2005  Weight: 118.2 kg    Physical Exam  Gen:- Awake Alert,  in no apparent distress  HEENT:- [place].AT, No sclera icterus Neck-Supple Neck,No JVD,.  Painless range of motion in the neck Lungs-  CTAB , fair symmetrical air movement CV- S1, S2 normal, regular  Abd-  +ve B.Sounds, Abd Soft, No tenderness, no CVA tenderness Extremity/Skin:- No  edema, pedal pulses present ,  No rash-tick bite area x 2 noted--they appear to have healed without any bull's-eye type findings Psych-affect is appropriate, oriented x3, No confusion or encephalopathy Neuro-no new focal deficits, no tremors, negative  Brudzinski and negative Kernig signs -Neurological exam is reassuring  Data Reviewed: I have personally reviewed following labs and imaging studies  CBC: Recent Labs  Lab 09/11/22 1614 09/12/22 0410 09/14/22 0431  WBC 5.6 5.1 2.5*  HGB 13.9 12.9* 12.3*  HCT 42.5 39.6 38.1*  MCV 84.7 85.5 85.4  PLT 128* 109* 63*   Basic Metabolic Panel: Recent Labs  Lab 09/11/22 1614 09/12/22 0410 09/13/22 0846 09/13/22 1424 09/14/22 0431  NA 135 136 136 136 137  K 3.5 3.8 3.9 3.7 3.8  CL 102 105 105 105 106  CO2 22 21* 24 22 22   GLUCOSE 154* 150* 198* 108* 106*  BUN 23* 25* 25* 24* 24*  CREATININE 2.10* 2.60* 1.98* 1.73* 1.69*  CALCIUM 8.4* 7.9* 7.9* 7.9* 7.7*  PHOS  --   --  2.4*  --   --    GFR: Estimated Creatinine Clearance: 59 mL/min (A) (by C-G formula based on SCr of 1.69 mg/dL (H)). Liver Function Tests: Recent Labs  Lab 09/11/22 1614 09/13/22 0846  AST 29  --   ALT 21  --   ALKPHOS 76  --   BILITOT 1.0  --   PROT 6.8  --   ALBUMIN 3.9 3.2*   Cardiac Enzymes: Recent Labs  Lab 09/11/22 1614  CKTOTAL 98   Recent Results (from the past 240 hour(s))  Blood Culture (routine x 2)     Status: None (Preliminary result)   Collection Time: 09/11/22  4:14 PM   Specimen: BLOOD  Result Value Ref Range Status   Specimen Description BLOOD BLOOD RIGHT ARM  Final   Special Requests   Final    BOTTLES DRAWN AEROBIC AND ANAEROBIC Blood Culture adequate volume   Culture   Final    NO GROWTH 3 DAYS Performed at [facility], [address] [place], [place] [address]    Report Status PENDING  Incomplete  Blood Culture (routine x 2)     Status: None (Preliminary result)   Collection Time: 09/11/22  4:14 PM   Specimen: BLOOD  Result Value Ref Range Status   Specimen Description BLOOD LEFT ANTECUBITAL  Final   Special Requests   Final  BOTTLES DRAWN AEROBIC AND ANAEROBIC Blood Culture adequate volume   Culture   Final    NO GROWTH 3 DAYS Performed at [facility],  [address], [place], [place] [address]    Report Status PENDING  Incomplete  Resp panel by RT-PCR (RSV, Flu A&B, Covid) Urine, Clean Catch     Status: None   Collection Time: 09/11/22  5:18 PM   Specimen: Urine, Clean Catch; Nasal Swab  Result Value Ref Range Status   SARS Coronavirus 2 by RT PCR NEGATIVE NEGATIVE Final    Comment: (NOTE) SARS-CoV-2 target nucleic acids are NOT DETECTED.  The SARS-CoV-2 RNA is generally detectable in upper respiratory specimens during the acute phase of infection. The lowest concentration of SARS-CoV-2 viral copies this assay can detect is 138 copies/mL. A negative result does not preclude SARS-Cov-2 infection and should not be used as the sole basis for treatment or other patient management decisions. A negative result may occur with  improper specimen collection/handling, submission of specimen other than nasopharyngeal swab, presence of viral mutation(s) within the areas targeted by this assay, and inadequate number of viral copies(<138 copies/mL). A negative result must be combined with clinical observations, patient history, and epidemiological information. The expected result is Negative.  Fact Sheet for Patients:  BloggerCourse.com  Fact Sheet for Healthcare Providers:  SeriousBroker.it  This test is no t yet approved or cleared by the [place] FDA and  has been authorized for detection and/or diagnosis of SARS-CoV-2 by FDA under an Emergency Use Authorization (EUA). This EUA will remain  in effect (meaning this test can be used) for the duration of the COVID-19 declaration under Section 564(b)(1) of the Act, 21 U.S.C.section 360bbb-3(b)(1), unless the authorization is terminated  or revoked sooner.       Influenza A by PCR NEGATIVE NEGATIVE Final   Influenza B by PCR NEGATIVE NEGATIVE Final    Comment: (NOTE) The Xpert Xpress SARS-CoV-2/FLU/RSV plus assay is intended as an aid in  the diagnosis of influenza from Nasopharyngeal swab specimens and should not be used as a sole basis for treatment. Nasal washings and aspirates are unacceptable for Xpert Xpress SARS-CoV-2/FLU/RSV testing.  Fact Sheet for Patients: BloggerCourse.com  Fact Sheet for Healthcare Providers: SeriousBroker.it  This test is not yet approved or cleared by the [place] FDA and has been authorized for detection and/or diagnosis of SARS-CoV-2 by FDA under an Emergency Use Authorization (EUA). This EUA will remain in effect (meaning this test can be used) for the duration of the COVID-19 declaration under Section 564(b)(1) of the Act, 21 U.S.C. section 360bbb-3(b)(1), unless the authorization is terminated or revoked.     Resp Syncytial Virus by PCR NEGATIVE NEGATIVE Final    Comment: (NOTE) Fact Sheet for Patients: BloggerCourse.com  Fact Sheet for Healthcare Providers: SeriousBroker.it  This test is not yet approved or cleared by the [place] FDA and has been authorized for detection and/or diagnosis of SARS-CoV-2 by FDA under an Emergency Use Authorization (EUA). This EUA will remain in effect (meaning this test can be used) for the duration of the COVID-19 declaration under Section 564(b)(1) of the Act, 21 U.S.C. section 360bbb-3(b)(1), unless the authorization is terminated or revoked.  Performed at [facility], [address], [place], [place] [address]   Culture, blood (Routine X 2) w Reflex to ID Panel     Status: None (Preliminary result)   Collection Time: 09/13/22  2:24 PM   Specimen: Left Antecubital; Blood  Result Value Ref Range  Status   Specimen Description   Final    LEFT ANTECUBITAL BOTTLES DRAWN AEROBIC AND ANAEROBIC   Special Requests Blood Culture adequate volume  Final   Culture   Final    NO GROWTH < 24 HOURS Performed at [facility], [address] [place], [place] [address]    Report Status PENDING  Incomplete  Culture, blood (Routine X 2) w Reflex to ID Panel     Status: None (Preliminary result)   Collection Time: 09/13/22  2:24 PM   Specimen: BLOOD LEFT HAND  Result Value Ref Range Status   Specimen Description   Final    BLOOD LEFT HAND BOTTLES DRAWN AEROBIC AND ANAEROBIC   Special Requests Blood Culture adequate volume  Final   Culture   Final    NO GROWTH < 24 HOURS Performed at [facility], [address], [place], [place] [address]    Report Status PENDING  Incomplete    Radiology Studies: DG Chest 1 View  Result Date: 09/13/2022 CLINICAL DATA:  Short of breath EXAM: CHEST  1 VIEW COMPARISON:  09/11/2022 FINDINGS: Single frontal view of the chest demonstrates stable postsurgical changes from CABG. The cardiac silhouette is unremarkable. No airspace disease, effusion, or pneumothorax. No acute bony abnormalities. IMPRESSION: 1. No acute intrathoracic process. Electronically Signed   By: Sharlet Salina M.D.   On: 09/13/2022 21:09   CT HEAD WO CONTRAST ( )  Result Date: 09/13/2022 CLINICAL DATA:  Headache, new onset (Age >= 51y) EXAM: CT HEAD WITHOUT CONTRAST TECHNIQUE: Contiguous axial images were obtained from the base of the skull through the vertex without intravenous contrast. RADIATION DOSE REDUCTION: This exam was performed according to the departmental dose-optimization program which includes automated exposure control, adjustment of the mA and/or kV according to patient size and/or use of iterative reconstruction technique. COMPARISON:  CT head 05/22/2013. FINDINGS: Brain: No evidence of acute infarction, hemorrhage, hydrocephalus, extra-axial collection or mass lesion/mass effect. Mild to moderate patchy white matter hypodensities, nonspecific but compatible with chronic microvascular ischemic change. Vascular: No hyperdense vessel. Skull: No acute fracture. Sinuses/Orbits: Clear sinuses.  No acute orbital findings.  IMPRESSION: No acute abnormality. Electronically Signed   By: Feliberto Harts M.D.   On: 09/13/2022 17:03    Scheduled Meds:  amLODipine  10 mg Oral Daily   aspirin EC  81 mg Oral Daily   atorvastatin  80 mg Oral QHS   clopidogrel  75 mg Oral Daily   doxycycline  100 mg Oral Q12H   gabapentin  800 mg Oral TID   sodium bicarbonate  650 mg Oral BID   Continuous Infusions:  ceFEPime (MAXIPIME) IV     vancomycin Stopped (09/14/22 0150)    LOS: 2 days   Shon Hale M.D on 09/14/2022 at 6:27 PM  Go to [email or web address] - for contact info  [facility] - Office  [phone]  If 7PM-7AM, please contact night-coverage [email or web address] 09/14/2022, 6:27 PM

## 2022-09-14 NOTE — Progress Notes (Signed)
Pharmacy Antibiotic Note  [name] is a 62 y.o. male admitted on 09/11/2022 with sepsis.  Pharmacy assisting with vancomycin and cefepime dosing. Renal function moderately improved.  Antimicrobials this admission: Doxy 5/19 x1 (recent tick bite) Vanco 5/16 >> Cefepime 5/16 >> Flagyl 5/16>> CTX 5/16   Microbiology results: 5/16 Bcx ngtd 5/18 Bcx ngtd 5/19 lyme disease P  Plan: Continue vanc and cefepime regimen Will likely dc tomorrow if cultures remain negative Hold off vanc levels for now  Height: 5\' 9"  (175.3 cm) Weight: 118.2 kg (260 lb 9.6 oz) IBW/kg (Calculated) : 70.7  Temp (24hrs), Avg:98.9 F (37.2 C), Min:98.3 F (36.8 C), Max:100.2 F (37.9 C)  Recent Labs  Lab 09/11/22 1614 09/11/22 1804 09/12/22 0410 09/13/22 0846 09/13/22 1424 09/14/22 0431  WBC 5.6  --  5.1  --   --  2.5*  CREATININE 2.10*  --  2.60* 1.98* 1.73* 1.69*  LATICACIDVEN 1.9 1.0  --   --   --   --      Estimated Creatinine Clearance: 59 mL/min (A) (by C-G formula based on SCr of 1.69 mg/dL (H)).    Thank you for allowing pharmacy to be a part of this patient's care.  Caryl Asp [facility] 09/14/2022 2:28 PM

## 2022-09-15 ENCOUNTER — Encounter (HOSPITAL_COMMUNITY): Payer: Self-pay | Admitting: Family Medicine

## 2022-09-15 DIAGNOSIS — R0789 Other chest pain: Secondary | ICD-10-CM | POA: Diagnosis not present

## 2022-09-15 DIAGNOSIS — R509 Fever, unspecified: Secondary | ICD-10-CM | POA: Diagnosis not present

## 2022-09-15 LAB — BASIC METABOLIC PANEL
Anion gap: 8 (ref 5–15)
BUN: 25 mg/dL — ABNORMAL HIGH (ref 6–20)
CO2: 22 mmol/L (ref 22–32)
Calcium: 7.7 mg/dL — ABNORMAL LOW (ref 8.9–10.3)
Chloride: 108 mmol/L (ref 98–111)
Creatinine, Ser: 1.46 mg/dL — ABNORMAL HIGH (ref 0.61–1.24)
GFR, Estimated: 55 mL/min — ABNORMAL LOW (ref >60)
Glucose, Bld: 117 mg/dL — ABNORMAL HIGH (ref 70–99)
Potassium: 3.8 mmol/L (ref 3.5–5.1)
Sodium: 138 mmol/L (ref 135–145)

## 2022-09-15 LAB — CBC
Hemoglobin: 12.3 g/dL — ABNORMAL LOW (ref 13.0–17.0)
RDW: 13.7 % (ref 11.5–15.5)

## 2022-09-15 MED ORDER — SODIUM BICARBONATE 650 MG PO TABS: 650.0000 mg | ORAL_TABLET | Freq: Every day | ORAL | 1 refills | Status: DC

## 2022-09-15 MED ORDER — ASPIRIN EC 81 MG PO TBEC: 81.0000 mg | DELAYED_RELEASE_TABLET | Freq: Every day | ORAL | 3 refills | Status: DC

## 2022-09-15 MED ORDER — DOXYCYCLINE HYCLATE 100 MG PO TABS: 100.0000 mg | ORAL_TABLET | Freq: Two times a day (BID) | ORAL | 0 refills | Status: AC

## 2022-09-15 NOTE — Progress Notes (Signed)
Ng Discharge Note  Admit Date:  09/11/2022 Discharge date: 09/15/2022   [name] to be D/C'd Home per MD order.  AVS completed. Patient/caregiver able to verbalize understanding.  Discharge Medication: Allergies as of 09/15/2022       Reactions   Rosuvastatin    Muscle aches. Other reaction(s): leg pain, Other Muscle aches. Other reaction(s): leg pain Other reaction(s): leg pain        Medication List     TAKE these medications    amLODipine 10 MG tablet Commonly known as: NORVASC Take 1 tablet (10 mg total) by mouth daily.   aspirin EC 81 MG tablet Take 1 tablet (81 mg total) by mouth daily with breakfast. What changed: when to take this   atorvastatin 80 MG tablet Commonly known as: LIPITOR Take 1 tablet (80 mg total) by mouth at bedtime. PT NEEDS OV FOR FURTHER REFILLS, NOT SEEN SINCE 2021   clopidogrel 75 MG tablet Commonly known as: PLAVIX TAKE 1 TABLET(75 MG) BY MOUTH DAILY   doxycycline 100 MG tablet Commonly known as: VIBRA-TABS Take 1 tablet (100 mg total) by mouth 2 (two) times daily for 10 days.   furosemide 20 MG tablet Commonly known as: LASIX TAKE 1 TABLET BY MOUTH 3 TIMES A WEEK AS NEEDED   gabapentin 800 MG tablet Commonly known as: NEURONTIN Take 800 mg by mouth 3 (three) times daily.   losartan 100 MG tablet Commonly known as: COZAAR Take 1 tablet (100 mg total) by mouth daily.   oxyCODONE-acetaminophen 10-325 MG tablet Commonly known as: PERCOCET Take 1 tablet by mouth every 6 (six) hours as needed.   Repatha SureClick 140 MG/ML Soaj Generic drug: Evolocumab Inject 140 mg into the skin every 14 (fourteen) days.   sodium bicarbonate 650 MG tablet Take 1 tablet (650 mg total) by mouth daily.        Discharge Assessment: Vitals:   09/14/22 2147 09/15/22 0442  BP: 129/70 (!) 116/50  Pulse: 72 (!) 50  Resp:  20  Temp:  98.3 F (36.8 C)  SpO2: 98% 99%   Skin clean, dry and intact without evidence of skin break down,  no evidence of skin tears noted. IV catheter discontinued intact. Site without signs and symptoms of complications - no redness or edema noted at insertion site, patient denies c/o pain - only slight tenderness at site.  Dressing with slight pressure applied.  D/c Instructions-Education: Discharge instructions given to patient/family with verbalized understanding. D/c education completed with patient/family including follow up instructions, medication list, d/c activities limitations if indicated, with other d/c instructions as indicated by MD - patient able to verbalize understanding, all questions fully answered. Patient instructed to return to ED, call 911, or call MD for any changes in condition.  Patient escorted via WC, and D/C home via private auto.  Cristal Ford, LPN 08/04/8117 1:47 PM

## 2022-09-15 NOTE — Progress Notes (Addendum)
Rounding Note    Patient Name: [name] Date of Encounter: 09/15/2022  [facility] Cardiologist: Kristeen Miss, MD   Subjective   Patient is overall feeling better. No chest pain or SOB.   Inpatient Medications    Scheduled Meds:  amLODipine  10 mg Oral Daily   aspirin EC  81 mg Oral Daily   atorvastatin  80 mg Oral QHS   clopidogrel  75 mg Oral Daily   doxycycline  100 mg Oral Q12H   gabapentin  800 mg Oral TID   sodium bicarbonate  650 mg Oral BID   Continuous Infusions:  ceFEPime (MAXIPIME) IV 2 g (09/15/22 0833)   PRN Meds: acetaminophen, butalbital-acetaminophen-caffeine, hydrALAZINE, ipratropium-albuterol, morphine injection, nitroGLYCERIN, ondansetron (ZOFRAN) IV   Vital Signs    Vitals:   09/14/22 1355 09/14/22 2053 09/14/22 2147 09/15/22 0442  BP: 129/69 (!) 88/52 129/70 (!) 116/50  Pulse: 74 (!) 57 72 (!) 50  Resp: 17 18  20   Temp: 100.2 F (37.9 C) 98.4 F (36.9 C)  98.3 F (36.8 C)  TempSrc: Oral Oral  Oral  SpO2: 95% 98% 98% 99%  Weight:      Height:        Intake/Output Summary (Last 24 hours) at 09/15/2022 1014 Last data filed at 09/15/2022 0900 Gross per 24 hour  Intake 1270 ml  Output --  Net 1270 ml      09/11/2022    8:05 PM 05/21/2022    2:57 PM 05/19/2022    8:00 AM  Last 3 Weights  Weight (lbs) 260 lb 9.6 oz 262 lb 9.6 oz 259 lb 3.2 oz  Weight (kg) 118.207 kg 119.115 kg 117.572 kg      Telemetry    SR/SB HR 50-60s, PVCs - Personally Reviewed  ECG    No new - Personally Reviewed  Physical Exam   GEN: No acute distress.   Neck: No JVD Cardiac: RRR, no murmurs, rubs, or gallops.  Respiratory: Clear to auscultation bilaterally. GI: Soft, nontender, non-distended  MS: No edema; No deformity. Neuro:  Nonfocal  Psych: Normal affect   Labs    High Sensitivity Troponin:   Recent Labs  Lab 09/11/22 1614 09/11/22 1804  TROPONINIHS 29* 30*     Chemistry Recent Labs  Lab 09/11/22 1614 09/12/22 0410  09/13/22 0846 09/13/22 1424 09/14/22 0431 09/15/22 0416  NA 135   < > 136 136 137 138  K 3.5   < > 3.9 3.7 3.8 3.8  CL 102   < > 105 105 106 108  CO2 22   < > 24 22 22 22   GLUCOSE 154*   < > 198* 108* 106* 117*  BUN 23*   < > 25* 24* 24* 25*  CREATININE 2.10*   < > 1.98* 1.73* 1.69* 1.46*  CALCIUM 8.4*   < > 7.9* 7.9* 7.7* 7.7*  PROT 6.8  --   --   --   --   --   ALBUMIN 3.9  --  3.2*  --   --   --   AST 29  --   --   --   --   --   ALT 21  --   --   --   --   --   ALKPHOS 76  --   --   --   --   --   BILITOT 1.0  --   --   --   --   --  GFRNONAA 35*   < > 38* 45* 46* 55*  ANIONGAP 11   < > 7 9 9 8    < > = values in this interval not displayed.    Lipids No results for input(s): "CHOL", "TRIG", "HDL", "LABVLDL", "LDLCALC", "CHOLHDL" in the last 168 hours.  Hematology Recent Labs  Lab 09/12/22 0410 09/14/22 0431 09/15/22 0416  WBC 5.1 2.5* 2.5*  RBC 4.63 4.46 4.43  HGB 12.9* 12.3* 12.3*  HCT 39.6 38.1* 38.5*  MCV 85.5 85.4 86.9  MCH 27.9 27.6 27.8  MCHC 32.6 32.3 31.9  RDW 13.1 13.6 13.7  PLT 109* 63* 65*   Thyroid No results for input(s): "TSH", "FREET4" in the last 168 hours.  BNPNo results for input(s): "BNP", "PROBNP" in the last 168 hours.  DDimer No results for input(s): "DDIMER" in the last 168 hours.   Radiology    DG Chest 1 View  Result Date: 09/13/2022 CLINICAL DATA:  Short of breath EXAM: CHEST  1 VIEW COMPARISON:  09/11/2022 FINDINGS: Single frontal view of the chest demonstrates stable postsurgical changes from CABG. The cardiac silhouette is unremarkable. No airspace disease, effusion, or pneumothorax. No acute bony abnormalities. IMPRESSION: 1. No acute intrathoracic process. Electronically Signed   By: Sharlet Salina M.D.   On: 09/13/2022 21:09   CT HEAD WO CONTRAST ( )  Result Date: 09/13/2022 CLINICAL DATA:  Headache, new onset (Age >= 51y) EXAM: CT HEAD WITHOUT CONTRAST TECHNIQUE: Contiguous axial images were obtained from the base of the  skull through the vertex without intravenous contrast. RADIATION DOSE REDUCTION: This exam was performed according to the departmental dose-optimization program which includes automated exposure control, adjustment of the mA and/or kV according to patient size and/or use of iterative reconstruction technique. COMPARISON:  CT head 05/22/2013. FINDINGS: Brain: No evidence of acute infarction, hemorrhage, hydrocephalus, extra-axial collection or mass lesion/mass effect. Mild to moderate patchy white matter hypodensities, nonspecific but compatible with chronic microvascular ischemic change. Vascular: No hyperdense vessel. Skull: No acute fracture. Sinuses/Orbits: Clear sinuses.  No acute orbital findings. IMPRESSION: No acute abnormality. Electronically Signed   By: Feliberto Harts M.D.   On: 09/13/2022 17:03    Cardiac Studies   Echo 08/2022   1. Left ventricular ejection fraction, by estimation, is 55 to 60%. The  left ventricle has normal function. The left ventricle has no regional  wall motion abnormalities. There is mild left ventricular hypertrophy.  Left ventricular diastolic parameters  are indeterminate.   2. Right ventricular systolic function is mildly reduced. The right  ventricular size is normal. There is normal pulmonary artery systolic  pressure.   3. Left atrial size was severely dilated.   4. Right atrial size was moderately dilated.   5. The mitral valve is abnormal. No evidence of mitral valve  regurgitation. No evidence of mitral stenosis. Moderate mitral annular  calcification.   6. The aortic valve is tricuspid. There is mild calcification of the  aortic valve. Aortic valve regurgitation is not visualized. No aortic  stenosis is present.   7. Aortic dilatation noted. There is borderline dilatation of the  ascending aorta, measuring 39 mm. Normal aortic root.   8. The inferior vena cava is normal in size with greater than 50%  respiratory variability, suggesting right  atrial pressure of 3 mmHg.   Echo 05/2019 1. Left ventricular ejection fraction, by estimation, is 55 to 60%. Left  ventricular ejection fraction by PLAX is 56 %. The left ventricle has  normal function. The left  ventricle has no regional wall motion  abnormalities. There is moderate concentric  left ventricular hypertrophy. Left ventricular diastolic parameters are  consistent with Grade I diastolic dysfunction (impaired relaxation).   2. Right ventricular systolic function is normal. The right ventricular  size is normal. There is mildly elevated pulmonary artery systolic  pressure.   3. Left atrial size was moderately dilated.   4. The mitral valve is normal in structure and function. No evidence of  mitral valve regurgitation. No evidence of mitral stenosis.   5. The aortic valve is tricuspid. Aortic valve regurgitation is not  visualized. No aortic stenosis is present.   6. The inferior vena cava is normal in size with greater than 50%  respiratory variability, suggesting right atrial pressure of 3 mmHg.   LHC 05/2016  Prox RCA lesion, 0 %stenosed. Ost RPDA lesion, 50 %stenosed. Ost Ramus to Ramus lesion, 100 %stenosed. Mid LAD lesion, 70 %stenosed. SVG. SVG. SVG. Origin lesion, 100 %stenosed. The left ventricular systolic function is normal. LV end diastolic pressure is normal.   [name] is a 62 y.o. male      [id number] LOCATION:  FACILITY: [facility]  PHYSICIAN: Nanetta Batty, M.D. 05-09-1960     DATE OF PROCEDURE:  06/02/2016  IMPRESSION: Mr. Carrero anatomy is unchanged from his last cardiac catheterization performed 2014. All his grafts are patent except his RCA vein graft which was demonstrated to be occluded 4 years ago. His RCA stent is patent as well. He has normal LV function. There were no protocol per lesions". He will be treated for "noncardiac chest pain". His right common femoral arterial puncture site was sealed with the "MYNX device". The patient left  the lab in stable condition.   Nanetta Batty. MD, [facility] 06/02/2016 2:26 PM  Patient Profile     62 y.o. male AD (s/p CABG in 2010 with DES to RCA in 2011, cath in 2018 showing known occlusion of SVG-RCA with other grafts and stent along native RCA being patent), PAD (s/p iliac stenting), HTN, HLD, OSA and COPD who is being seen 09/12/2022 for the evaluation of chest pain and elevated troponin values   Assessment & Plan    Atypical chest pain Elevated troponin CAD status post CABG in 2010 with DES to RCA in 2011 -Reported episodes of atypical chest pain.  It was constant and not associated with exertion. -High-sensitivity troponin 29, 30.  Which is up not consistent with ACS -Cath in 2019 showed known occlusion of SVG to RCA with other grafts and stents along with native RCA being patent -CRP elevated to 6.3. sed rate wnl -Echo showed LVEF 55-60%, no WMA, severely dilated LA, moderately dilated RA -Not a good cath candidate given AKI -Continue aspirin, Plavix, statin, Repatha. No further ischemic work-up  Hypertension -Losartan held for AKI -Amlodipine 10 mg daily  PVCs - seen on telemetry - BP soft at times - may need addition of BB, however baseline bradycardia may preclude this. Can consider OP monitor  AKI -Serum creatinine elevated 2.1 on admission. IT went up to 2.6 and is now trending down s/p IVF - Scr 1.46 today -Prior creatinine 1.26  For questions or updates, please contact [facility] Please consult [email or web address] for contact info under        Signed, Cadence David Stall, PA-C  09/15/2022, 10:14 AM     Attending notes  1.CAD - prior CABG in 2010, DES to RCA in 2011 - Cath in 2018 patent grafts except RCA graft  chronically occluded, patent RCA stent.  - mild flat trop not consistent with ACS, looks to have chronically elevated trops. EKG without specific ischemic changes - 08/2022 echo: LVEF 55-60%, no WMAs - atypical symtoms have resolved, no further  workup planned  - medical therapy with ASA, atorva 80, plavix 75.    We will sign off inpatient care, arrange outpatient f/u   Dina Rich MD

## 2022-09-15 NOTE — Consult Note (Signed)
[facility] Customer service manager [facility]) Accountable Care Organization (ACO) [facility] Liaison Note  09/15/2022  DEDERICK LIPSON July 26, 1960 [id number]  Location: [facility] Liaison screened the patient remotely at [facility].  Insurance: [employer]   KEMO FINKEL is a 62 y.o. male who is a Primary Care Patient of [name], MD. The patient was screened for readmission hospitalization with noted medium risk score for unplanned readmission risk with 1 IP in 6 months.  The patient was assessed for potential [facility]) Care Management service needs for post hospital transition for care coordination. Review of patient's electronic medical record reveals patient was admitted with Acute Infective Myocarditis.   Plan: [facility] Liaison will continue to follow progress and disposition to asess for post hospital community care coordination/management needs.  Referral request for community care coordination: anticipate [facility] Transitions of Care Team follow up.   [facility] Care Management/Population Health does not replace or interfere with any arrangements made by the Inpatient Transition of Care team.   For questions contact:   Elliot Cousin, RN, BSN [facility] [place] Liaison [place]   [facility]  Population Health Office Hours MTWF  8:00 am-6:00 pm Off on Thursday [phone] mobile [phone] [Office toll free line][facility] Office Hours are M-F 8:30 - 5 pm 24 hour nurse advise line [phone] Concierge  [email or web address]

## 2022-09-15 NOTE — Care Management Important Message (Signed)
Important Message  Patient Details  Name: TANISHA EDELMAN MRN: [id number] Date of Birth: 06/07/1960   Medicare Important Message Given:  N/A - LOS <3 / Initial given by admissions     Anjel Pardo L Prabhjot Maddux 09/15/2022, 10:00 AM

## 2022-09-15 NOTE — Discharge Instructions (Signed)
1)You Need BMP and CBC Blood Tests within the Next 4 to 5 days  2)Avoid ibuprofen/Advil/Aleve/Motrin/Goody Powders/Naproxen/BC powders/Meloxicam/Diclofenac/Indomethacin and other Nonsteroidal anti-inflammatory medications as these will make you more likely to bleed and can cause stomach ulcers, can also cause Kidney problems.  3)Follow up with [name], MD (Primary Care Provider) within the next 4 to 5 days for recheck and Blood work.Marland Kitchen  4)Follow up with your cardiologist as previously scheduled

## 2022-09-15 NOTE — Discharge Summary (Signed)
[name], is a 62 y.o. male  DOB [birth year]  MRN [id number].  Admission date:  09/11/2022  Admitting Physician  Apryle Stowell Mariea Clonts, MD  Discharge Date:  09/15/2022   Primary MD  [name], MD  Recommendations for primary care physician for things to follow:   1)You Need BMP and CBC Blood Tests within the Next 4 to 5 days  2)Avoid ibuprofen/Advil/Aleve/Motrin/Goody Powders/Naproxen/BC powders/Meloxicam/Diclofenac/Indomethacin and other Nonsteroidal anti-inflammatory medications as these will make you more likely to bleed and can cause stomach ulcers, can also cause Kidney problems.  3)Follow up with [name], MD (Primary Care Provider) within the next 4 to 5 days for recheck and Blood work.Marland Kitchen  4)Follow up with your cardiologist as previously scheduled  Admission Diagnosis  Fever of unknown origin [R50.9] Acute infective myocarditis, due to unspecified organism [I40.0] Chest pain [R07.9]   Discharge Diagnosis  Fever of unknown origin [R50.9] Acute infective myocarditis, due to unspecified organism [I40.0] Chest pain [R07.9]  ***  Principal Problem:   Fever of unknown origin Active Problems:   Chest pain   AKI (acute kidney injury) (HCC)   Coronary artery disease   Essential hypertension   COPD (chronic obstructive pulmonary disease) (HCC)   Peripheral vascular disease (HCC)      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   Chest pain    COPD (chronic obstructive pulmonary disease) (HCC)    Coronary artery disease    STATUS POST CABG 01/2009   Dyslipidemia    Hyperlipidemia    Hypertension    Orthostatic hypotension    Peripheral vascular disease (HCC)    Syncope and collapse     Past Surgical History:  Procedure Laterality Date   ANKLE SURGERY     x 2   COLONOSCOPY  April 2013   CORONARY ANGIOPLASTY     CORONARY ARTERY BYPASS  GRAFT  2010   FOOT SURGERY     HERNIA REPAIR  01/23/12   umb hernia   Kidney stimulator     LEFT HEART CATH AND CORS/GRAFTS ANGIOGRAPHY N/A 06/02/2016   Procedure: Left Heart Cath and Cors/Grafts Angiography;  Surgeon: Runell Gess, MD;  Location: [facility] INVASIVE CV LAB;  Service: Cardiovascular;  Laterality: N/A;   LEFT HEART CATHETERIZATION WITH CORONARY/GRAFT ANGIOGRAM N/A 07/08/2012   Procedure: LEFT HEART CATHETERIZATION WITH Isabel Caprice;  Surgeon: Vesta Mixer, MD;  Location: [facility] CATH LAB;  Service: Cardiovascular;  Laterality: N/A;   PR VEIN BYPASS GRAFT,AORTO-FEM-POP     STERNAL WIRES REMOVAL  07/08/2011   Procedure: STERNAL WIRES REMOVAL;  Surgeon: [name], MD;  Location: [facility] OR;  Service: Open Heart Surgery;  Laterality: N/A;  removal of 4th wire       HPI  from the history and physical done on the day of admission:   Chief Complaint: SOB, chest pain   HPI: [name] is a 62 y.o. male with medical history significant for COPD, coronary artery disease with CABG  in 2010, hypertension, peripheral vascular disease status post bilateral stent placement. Patient presented to the ED with multiple complaints of headache, difficulty breathing, left-sided chest pain, bilateral lower extremity pain, present sleepiness. Symptoms all started about 2 days ago.  Reports chest pain started while he was walking to his car garage, he describes sharp chest pain that radiates down his left arm, and has persisted since onset.  No aggravating or relieving symptoms.  Chest pain is similar to chest pain that resulted in CABG in 2010.  He has had persistent bilateral lower extremity pain from his knee downwards, but while he was walking, but has persistent despite resting.  No redness, no erythema or discoloration, no ulcers.  Reports stents to his bilateral lower extremities, and pain at that time was similar to pain today. No abdominal pain, no urinary symptoms, no vomiting or  diarrhea, no cough.  Had routine dental work today, symptoms started 2 days ago.   ED Course: Febrile to 101.8.  Heart rate 43-77.  Respiratory 20-22.  Blood pressure systolic 132-159.  Lactic acid 1.9 > 1.  Troponin 29> 30.  EKG shows frequent PVCs, rate 87, QTc 428.  Creatinine elevated 2.1.  UA with rare bacteria.  Chest x-ray clear. LR 150 cc/h started.  IV ceftriaxone 1 g given for possible bacteremia. EDP talked to the cardiology Dr. Royann Shivers due to concerns for possible acute myocarditis as etiology for fever. Recommended admission, echo, cardiology team to see in consult   Review of Systems: As per HPI all other systems reviewed and negative     Hospital Course:   Brief Narrative:  62 y.o. male with medical history significant for COPD, coronary artery disease with CABG in 2010, hypertension, peripheral vascular disease status post bilateral stent placement admitted on 09/11/2022 with FUO, AKI and chest pains   -Assessment and Plan: 1)Fever of unknown origin--POA --Spiking fevers again--Tmax today 101.3 -No leukocytosis - procalcitonin- Elevated at 0.78 on 09/11/2022 -CRP is elevated but sed rate is WNL on 09/11/2022 -Chest x-ray and UA not suggestive of infection -Blood cultures from 09/10/2022 NGTD -Repeat blood cultures requested on 09/13/2022 due to recurrent fevers -In the setting of recurrent/persistent fevers we will continue IV vancomycin cefepime pending culture data -Okay to stop metronidazole -Doxycycline added due to history of tick bite -Patient now reports Tick Bite a couple of  weeks ago-tick bite area x 2 noted--they appear to have healed without any bull's-eye type findings -test for Lyme disease, RMSF and Ehrlichia requested - 2)AKI (acute kidney injury) (HCC) Cr elevated 2.1, baseline 1.2-1.5. Creatinine this admission 2.10 >>2.60>> 1.98>> 1.69---renal function improving with hydration and discontinuation of Lasix and losartan -Continue to hold Lasix 20 mg  daily, hold losartan 100 mg daily -Patient is voiding well - renally adjust medications, avoid nephrotoxic agents / dehydration  / hypotension   3)Chest pain Chest pain + dyspnea with typical and atypical features.  In the setting of fever.  Troponin 29 > 30.  EKG/rhythm with frequent PVCs.  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  Follows with cardiology. -Echo from 09/12/2022 with EF of 55 to 60%, no AS and no MS -C/n aspirin, Plavix and Lipitor -IV morphine 2 mg every 4 hours as needed, nitro as needed -Hold off on starting beta-blocker due to concerns about bradycardia -Cardiology consult appreciated -Patient remains chest pain-free as of 09/13/2022   4)Peripheral vascular disease (HCC) Reports persistent bilateral lower extremity pain.  No suggestion of  infection.  Status post iliac stenting.  May be neuropathy, he is on gabapentin. -Bilateral arterial Dopplers -Arterial ultrasound with ABI shows--Bilateral lower extremity ABIs greater than 1.4 are non-diagnostic secondary to incompressible vessel calcifications. And  Somewhat dampened arterial waveforms are noted at the right dorsalis pedis artery. --C/n aspirin, Plavix and Lipitor   5)COPD (chronic obstructive pulmonary disease) (HCC) Stable. -DuoNebs as needed   6)HTN- Stable. C/n  Norvasc, -Hold losartan and Lasix due to AKI   7)Persistent headaches--- patient Now tells me that has had persistent headaches for at least 4 days -No neck pain or neck stiffness, no URI symptoms, no confusion no visual disturbance no dizziness -No focal extremity weakness or numbness and no speech or gait concerns -CT head without acute findings -neck and skin exam reassuring and low clinical index of suspicion for meningitis --Patient now reports Tick Bite a couple of  weeks ago----tick bite area x 2 noted--they appear to have healed without any bull's-eye type findings -test for Lyme disease, RMSF and  Ehrlichia requested - 8)Pancytopenia--this may be infection related.... Likely tickborne Platelets trending down, anemia and leukopenia noted -Subcu heparin discontinued-due to platelet drop -Doxycycline for presumed tickborne infection     Status is: Inpatient    Disposition: The patient is from: Home              Anticipated d/c is to: Home----pending resolution of fevers and culture data              Anticipated d/c date is: 2 days      Discharge Condition: ***  Follow UP   Follow-up Information     [name], MD Follow up in 4 day(s).   Specialty: Family Medicine Why: Repeat CBC and BMP Blood Tests- Contact information: [address] [place] [address] [phone]                  Consults obtained - ***  Diet and Activity recommendation:  As advised  Discharge Instructions    **** Discharge Instructions     Ambulatory Referral for Lung Cancer Scre   Complete by: As directed    Call MD for:  difficulty breathing, headache or visual disturbances   Complete by: As directed    Call MD for:  persistant dizziness or light-headedness   Complete by: As directed    Call MD for:  persistant nausea and vomiting   Complete by: As directed    Call MD for:  redness, tenderness, or signs of infection (pain, swelling, redness, odor or green/yellow discharge around incision site)   Complete by: As directed    Call MD for:  temperature >100.4   Complete by: As directed    Diet - low sodium heart healthy   Complete by: As directed    Discharge instructions   Complete by: As directed    1)You Need BMP and CBC Blood Tests within the Next 4 to 5 days  2)Avoid ibuprofen/Advil/Aleve/Motrin/Goody Powders/Naproxen/BC powders/Meloxicam/Diclofenac/Indomethacin and other Nonsteroidal anti-inflammatory medications as these will make you more likely to bleed and can cause stomach ulcers, can also cause Kidney problems.  3)Follow up with [name], MD (Primary Care Provider) within the next 4 to 5 days for recheck and Blood work.Marland Kitchen  4)Follow up with your cardiologist as previously scheduled   Increase activity slowly   Complete by: As directed          Discharge Medications     Allergies as of  09/15/2022       Reactions   Rosuvastatin    Muscle aches. Other reaction(s): leg pain, Other Muscle aches. Other reaction(s): leg pain Other reaction(s): leg pain        Medication List     TAKE these medications    amLODipine 10 MG tablet Commonly known as: NORVASC Take 1 tablet (10 mg total) by mouth daily.   aspirin EC 81 MG tablet Take 1 tablet (81 mg total) by mouth daily with breakfast. What changed: when to take this   atorvastatin 80 MG tablet Commonly known as: LIPITOR Take 1 tablet (80 mg total) by mouth at bedtime. PT NEEDS OV FOR FURTHER REFILLS, NOT SEEN SINCE 2021   clopidogrel 75 MG tablet Commonly known as: PLAVIX TAKE 1 TABLET(75 MG) BY MOUTH DAILY   doxycycline 100 MG tablet Commonly known as: VIBRA-TABS Take 1 tablet (100 mg total) by mouth 2 (two) times daily for 10 days.   furosemide 20 MG tablet Commonly known as: LASIX TAKE 1 TABLET BY MOUTH 3 TIMES A WEEK AS NEEDED   gabapentin 800 MG tablet Commonly known as: NEURONTIN Take 800 mg by mouth 3 (three) times daily.   losartan 100 MG tablet Commonly known as: COZAAR Take 1 tablet (100 mg total) by mouth daily.   oxyCODONE-acetaminophen 10-325 MG tablet Commonly known as: PERCOCET Take 1 tablet by mouth every 6 (six) hours as needed.   Repatha SureClick 140 MG/ML Soaj Generic drug: Evolocumab Inject 140 mg into the skin every 14 (fourteen) days.   sodium bicarbonate 650 MG tablet Take 1 tablet (650 mg total) by mouth daily.        Major procedures and Radiology Reports - PLEASE review detailed and final reports for all details, in brief -   ***  DG Chest 1 View  Result Date: 09/13/2022 CLINICAL DATA:  Short of breath  EXAM: CHEST  1 VIEW COMPARISON:  09/11/2022 FINDINGS: Single frontal view of the chest demonstrates stable postsurgical changes from CABG. The cardiac silhouette is unremarkable. No airspace disease, effusion, or pneumothorax. No acute bony abnormalities. IMPRESSION: 1. No acute intrathoracic process. Electronically Signed   By: Sharlet Salina M.D.   On: 09/13/2022 21:09   CT HEAD WO CONTRAST ( )  Result Date: 09/13/2022 CLINICAL DATA:  Headache, new onset (Age >= 51y) EXAM: CT HEAD WITHOUT CONTRAST TECHNIQUE: Contiguous axial images were obtained from the base of the skull through the vertex without intravenous contrast. RADIATION DOSE REDUCTION: This exam was performed according to the departmental dose-optimization program which includes automated exposure control, adjustment of the mA and/or kV according to patient size and/or use of iterative reconstruction technique. COMPARISON:  CT head 05/22/2013. FINDINGS: Brain: No evidence of acute infarction, hemorrhage, hydrocephalus, extra-axial collection or mass lesion/mass effect. Mild to moderate patchy white matter hypodensities, nonspecific but compatible with chronic microvascular ischemic change. Vascular: No hyperdense vessel. Skull: No acute fracture. Sinuses/Orbits: Clear sinuses.  No acute orbital findings. IMPRESSION: No acute abnormality. Electronically Signed   By: Feliberto Harts M.D.   On: 09/13/2022 17:03   US ARTERIAL ABI (SCREENING LOWER EXTREMITY)  Result Date: 09/12/2022 CLINICAL DATA:  242300 Bilateral leg pain 242300 EXAM: NONINVASIVE PHYSIOLOGIC VASCULAR STUDY OF BILATERAL LOWER EXTREMITIES TECHNIQUE: Evaluation of both lower extremities were performed at rest, including calculation of ankle-brachial indices with single level Doppler, pressure and pulse volume recording. COMPARISON:  None Available. FINDINGS: Right ABI:  1.42 Left ABI:  1.45 Right Lower Extremity: Normal arterial waveforms at the posterior  tibial artery. Dampened  more monophasic waveforms of the dorsalis pedis artery. Irregular heart rate is noted. Left Lower Extremity: Normal triphasic/biphasic waveforms at the ankle. Irregular heart rate noted. IMPRESSION: 1. Bilateral lower extremity ABIs greater than 1.4 are nondiagnostic secondary to incompressible vessel calcifications. 2. Somewhat dampened arterial waveforms are noted at the right dorsalis pedis artery. 3. Irregular heart rate is noted on arterial waveforms examination. Correlate for atrial fibrillation. Electronically Signed   By: Olive Bass M.D.   On: 09/12/2022 10:52   ECHOCARDIOGRAM COMPLETE  Result Date: 09/12/2022    ECHOCARDIOGRAM REPORT   Patient Name:   MORROW FISS Matsushita Date of Exam: 09/12/2022 Medical Rec #:  [id number]       Height:       69.0 in Accession #:    [id number]      Weight:       260.6 lb Date of Birth:  June 18, 1960        BSA:          2.312 m Patient Age:    62 years        BP:           145/71 mmHg Patient Gender: M               HR:           73 bpm. [facility] Procedure: 2D Echo, Color Doppler, Cardiac Doppler and Intracardiac            Opacification Agent Indications:     Chest Pain R07.9  History:         Patient has prior history of Echocardiogram examinations, most                  recent 06/20/2019. CAD and Previous Myocardial Infarction,                  Signs/Symptoms:Chest Pain, Fever and Shortness of Breath; Risk                  Factors:Dyslipidemia, Hypertension and Former Smoker.  Sonographer:     Aron Baba Referring Phys:  [id number] Onnie Boer Diagnosing Phys: Winfield Rast Mallipeddi  Sonographer Comments: Technically difficult study due to poor echo windows. Image acquisition challenging due to COPD. IMPRESSIONS  1. Left ventricular ejection fraction, by estimation, is 55 to 60%. The left ventricle has normal function. The left ventricle has no regional wall motion abnormalities. There is mild left ventricular hypertrophy. Left ventricular diastolic  parameters are indeterminate.  2. Right ventricular systolic function is mildly reduced. The right ventricular size is normal. There is normal pulmonary artery systolic pressure.  3. Left atrial size was severely dilated.  4. Right atrial size was moderately dilated.  5. The mitral valve is abnormal. No evidence of mitral valve regurgitation. No evidence of mitral stenosis. Moderate mitral annular calcification.  6. The aortic valve is tricuspid. There is mild calcification of the aortic valve. Aortic valve regurgitation is not visualized. No aortic stenosis is present.  7. Aortic dilatation noted. There is borderline dilatation of the ascending aorta, measuring 39 mm. Normal aortic root.  8. The inferior vena cava is normal in size with greater than 50% respiratory variability, suggesting right atrial pressure of 3 mmHg. Comparison(s): No significant change from prior study. FINDINGS  Left Ventricle: Left ventricular ejection fraction, by estimation, is 55 to 60%. The left ventricle has normal function. The left ventricle has no regional wall motion abnormalities.  Definity contrast agent was given IV to delineate the left ventricular  endocardial borders. The left ventricular internal cavity size was normal in size. There is mild left ventricular hypertrophy. Left ventricular diastolic parameters are indeterminate. Right Ventricle: The right ventricular size is normal. No increase in right ventricular wall thickness. Right ventricular systolic function is mildly reduced. There is normal pulmonary artery systolic pressure. The tricuspid regurgitant velocity is 1.68 m/s, and with an assumed right atrial pressure of 3 mmHg, the estimated right ventricular systolic pressure is 14.3 mmHg. Left Atrium: Left atrial size was severely dilated. Right Atrium: Right atrial size was moderately dilated. Pericardium: There is no evidence of pericardial effusion. Mitral Valve: The mitral valve is abnormal. Moderate mitral annular  calcification. No evidence of mitral valve regurgitation. No evidence of mitral valve stenosis. Tricuspid Valve: The tricuspid valve is not well visualized. Tricuspid valve regurgitation is trivial. No evidence of tricuspid stenosis. Aortic Valve: The aortic valve is tricuspid. There is mild calcification of the aortic valve. Aortic valve regurgitation is not visualized. No aortic stenosis is present. Pulmonic Valve: The pulmonic valve was not well visualized. Pulmonic valve regurgitation is trivial. No evidence of pulmonic stenosis. Aorta: Aortic dilatation noted and the aortic root is normal in size and structure. There is borderline dilatation of the ascending aorta, measuring 39 mm. Venous: The inferior vena cava is normal in size with greater than 50% respiratory variability, suggesting right atrial pressure of 3 mmHg. IAS/Shunts: The interatrial septum was not well visualized.  LEFT VENTRICLE PLAX 2D LVIDd:         5.10 cm   Diastology LVIDs:         3.60 cm   LV e' medial:    5.68 cm/s LV PW:         1.30 cm   LV E/e' medial:  18.7 LV IVS:        1.40 cm   LV e' lateral:   10.30 cm/s LVOT diam:     2.30 cm   LV E/e' lateral: 10.3 LV SV:         68 LV SV Index:   29 LVOT Area:     4.15 cm  RIGHT VENTRICLE RV S prime:     11.40 cm/s TAPSE (M-mode): 1.3 cm LEFT ATRIUM              Index        RIGHT ATRIUM           Index LA diam:        4.20 cm  1.82 cm/m   RA Area:     28.20 cm LA Vol (A2C):   161.0 ml 69.64 ml/m  RA Volume:   100.00 ml 43.25 ml/m LA Vol (A4C):   154.0 ml 66.61 ml/m LA Biplane Vol: 157.0 ml 67.91 ml/m  AORTIC VALVE LVOT Vmax:   88.85 cm/s LVOT Vmean:  56.400 cm/s LVOT VTI:    0.164 m  AORTA Ao Root diam: 3.30 cm Ao Asc diam:  3.90 cm MITRAL VALVE                TRICUSPID VALVE MV Area (PHT): 5.66 cm     TR Peak grad:   11.3 mmHg MV Decel Time: 134 msec     TR Vmax:        168.00 cm/s MV E velocity: 106.00 cm/s MV A velocity: 55.70 cm/s   SHUNTS MV E/A ratio:  1.90  Systemic VTI:   0.16 m                             Systemic Diam: 2.30 cm Vishnu Priya Mallipeddi Electronically signed by Winfield Rast Mallipeddi Signature Date/Time: 09/12/2022/10:18:03 AM    Final (Updated)    DG Chest 2 View  Result Date: 09/11/2022 CLINICAL DATA:  Cough, shortness of breath EXAM: CHEST - 2 VIEW COMPARISON:  Previous studies including chest radiographs done on 05/31/2016 FINDINGS: Transit of diameter of heart is increased. There is previous coronary bypass surgery. There are no signs of pulmonary edema or new focal infiltrates. There is no pleural effusion or pneumothorax. IMPRESSION: No active cardiopulmonary disease. Electronically Signed   By: Ernie Avena M.D.   On: 09/11/2022 13:54    Micro Results   *** Recent Results (from the past 240 hour(s))  Blood Culture (routine x 2)     Status: None (Preliminary result)   Collection Time: 09/11/22  4:14 PM   Specimen: BLOOD  Result Value Ref Range Status   Specimen Description BLOOD BLOOD RIGHT ARM  Final   Special Requests   Final    BOTTLES DRAWN AEROBIC AND ANAEROBIC Blood Culture adequate volume   Culture   Final    NO GROWTH 3 DAYS Performed at [facility], [address], [place], [place] [address]    Report Status PENDING  Incomplete  Blood Culture (routine x 2)     Status: None (Preliminary result)   Collection Time: 09/11/22  4:14 PM   Specimen: BLOOD  Result Value Ref Range Status   Specimen Description BLOOD LEFT ANTECUBITAL  Final   Special Requests   Final    BOTTLES DRAWN AEROBIC AND ANAEROBIC Blood Culture adequate volume   Culture   Final    NO GROWTH 3 DAYS Performed at [facility], [address] [place], [place] [address]    Report Status PENDING  Incomplete  Resp panel by RT-PCR (RSV, Flu A&B, Covid) Urine, Clean Catch     Status: None   Collection Time: 09/11/22  5:18 PM   Specimen: Urine, Clean Catch; Nasal Swab  Result Value Ref Range Status   SARS Coronavirus 2 by RT PCR NEGATIVE NEGATIVE Final     Comment: (NOTE) SARS-CoV-2 target nucleic acids are NOT DETECTED.  The SARS-CoV-2 RNA is generally detectable in upper respiratory specimens during the acute phase of infection. The lowest concentration of SARS-CoV-2 viral copies this assay can detect is 138 copies/mL. A negative result does not preclude SARS-Cov-2 infection and should not be used as the sole basis for treatment or other patient management decisions. A negative result may occur with  improper specimen collection/handling, submission of specimen other than nasopharyngeal swab, presence of viral mutation(s) within the areas targeted by this assay, and inadequate number of viral copies(<138 copies/mL). A negative result must be combined with clinical observations, patient history, and epidemiological information. The expected result is Negative.  Fact Sheet for Patients:  BloggerCourse.com  Fact Sheet for Healthcare Providers:  SeriousBroker.it  This test is no t yet approved or cleared by the [place] FDA and  has been authorized for detection and/or diagnosis of SARS-CoV-2 by FDA under an Emergency Use Authorization (EUA). This EUA will remain  in effect (meaning this test can be used) for the duration of the COVID-19 declaration under Section 564(b)(1) of the Act, 21 U.S.C.section 360bbb-3(b)(1), unless the authorization is terminated  or revoked sooner.  Influenza A by PCR NEGATIVE NEGATIVE Final   Influenza B by PCR NEGATIVE NEGATIVE Final    Comment: (NOTE) The Xpert Xpress SARS-CoV-2/FLU/RSV plus assay is intended as an aid in the diagnosis of influenza from Nasopharyngeal swab specimens and should not be used as a sole basis for treatment. Nasal washings and aspirates are unacceptable for Xpert Xpress SARS-CoV-2/FLU/RSV testing.  Fact Sheet for Patients: BloggerCourse.com  Fact Sheet for Healthcare  Providers: SeriousBroker.it  This test is not yet approved or cleared by the [place] FDA and has been authorized for detection and/or diagnosis of SARS-CoV-2 by FDA under an Emergency Use Authorization (EUA). This EUA will remain in effect (meaning this test can be used) for the duration of the COVID-19 declaration under Section 564(b)(1) of the Act, 21 U.S.C. section 360bbb-3(b)(1), unless the authorization is terminated or revoked.     Resp Syncytial Virus by PCR NEGATIVE NEGATIVE Final    Comment: (NOTE) Fact Sheet for Patients: BloggerCourse.com  Fact Sheet for Healthcare Providers: SeriousBroker.it  This test is not yet approved or cleared by the [place] FDA and has been authorized for detection and/or diagnosis of SARS-CoV-2 by FDA under an Emergency Use Authorization (EUA). This EUA will remain in effect (meaning this test can be used) for the duration of the COVID-19 declaration under Section 564(b)(1) of the Act, 21 U.S.C. section 360bbb-3(b)(1), unless the authorization is terminated or revoked.  Performed at [facility], [address] [place], [place] [address]   Culture, blood (Routine X 2) w Reflex to ID Panel     Status: None (Preliminary result)   Collection Time: 09/13/22  2:24 PM   Specimen: Left Antecubital; Blood  Result Value Ref Range Status   Specimen Description   Final    LEFT ANTECUBITAL BOTTLES DRAWN AEROBIC AND ANAEROBIC   Special Requests Blood Culture adequate volume  Final   Culture   Final    NO GROWTH < 24 HOURS Performed at [facility], [address], [place], [place] [address]    Report Status PENDING  Incomplete  Culture, blood (Routine X 2) w Reflex to ID Panel     Status: None (Preliminary result)   Collection Time: 09/13/22  2:24 PM   Specimen: BLOOD LEFT HAND  Result Value Ref Range Status   Specimen Description   Final    BLOOD LEFT HAND  BOTTLES DRAWN AEROBIC AND ANAEROBIC   Special Requests Blood Culture adequate volume  Final   Culture   Final    NO GROWTH < 24 HOURS Performed at [facility], [address], [place], [place] [address]    Report Status PENDING  Incomplete    Today   Subjective    Lorita Officer today has no ***          Patient has been seen and examined prior to discharge   Objective   Blood pressure (!) 116/50, pulse (!) 50, temperature 98.3 F (36.8 C), temperature source Oral, resp. rate 20, height 5\' 9"  (1.753 m), weight 118.2 kg, SpO2 99 %.   Intake/Output Summary (Last 24 hours) at 09/15/2022 1340 Last data filed at 09/15/2022 0900 Gross per 24 hour  Intake 790 ml  Output --  Net 790 ml    Exam Gen:- Awake Alert, no acute distress *** HEENT:- [place].AT, No sclera icterus Neck-Supple Neck,No JVD,.  Lungs-  CTAB , good air movement bilaterally CV- S1, S2 normal, regular Abd-  +ve B.Sounds, Abd Soft, No tenderness,    Extremity/Skin:- No  edema,   good pulses Psych-affect is appropriate, oriented x3 Neuro-no new focal deficits, no tremors ***   Data Review   CBC w Diff:  Lab Results  Component Value Date   WBC 2.5 (L) 09/15/2022   HGB 12.3 (L) 09/15/2022   HGB 15.6 02/23/2017   HCT 38.5 (L) 09/15/2022   HCT 46.3 02/23/2017   PLT 65 (L) 09/15/2022   PLT 202 02/23/2017   LYMPHOPCT 14 04/15/2021   MONOPCT 9.3 05/19/2022   EOSPCT 3.0 05/19/2022   BASOPCT 1.0 05/19/2022    CMP:  Lab Results  Component Value Date   NA 138 09/15/2022   NA 141 01/11/2019   K 3.8 09/15/2022   CL 108 09/15/2022   CO2 22 09/15/2022   BUN 25 (H) 09/15/2022   BUN 17 01/11/2019   CREATININE 1.46 (H) 09/15/2022   CREATININE 1.26 05/19/2022   PROT 6.8 09/11/2022   PROT 7.1 05/10/2021   ALBUMIN 3.2 (L) 09/13/2022   ALBUMIN 4.5 05/10/2021   BILITOT 1.0 09/11/2022   BILITOT 0.6 05/10/2021   ALKPHOS 76 09/11/2022   AST 29 09/11/2022   ALT 21 09/11/2022  .  Total Discharge time is about 33  minutes  Shon Hale M.D on 09/15/2022 at 1:40 PM  Go to [email or web address] -  for contact info  [facility] - Office  [phone]

## 2022-09-16 ENCOUNTER — Telehealth: Payer: Self-pay

## 2022-09-16 LAB — LYME DISEASE, WESTERN BLOT
IgG P18 Ab.: ABSENT
IgG P23 Ab.: ABSENT
IgG P28 Ab.: ABSENT
IgG P30 Ab.: ABSENT
IgG P39 Ab.: ABSENT
IgG P41 Ab.: ABSENT
IgG P45 Ab.: ABSENT
IgG P66 Ab.: ABSENT
IgG P93 Ab.: ABSENT
IgM P23 Ab.: ABSENT
IgM P39 Ab.: ABSENT
IgM P41 Ab.: ABSENT
Lyme IgG Wb: NEGATIVE
Lyme IgM Wb: NEGATIVE

## 2022-09-16 LAB — EHRLICHIA ANTIBODY PANEL
E chaffeensis (HGE) Ab, IgG: NEGATIVE
E chaffeensis (HGE) Ab, IgM: NEGATIVE
E. Chaffeensis (HME) IgM Titer: NEGATIVE
E.Chaffeensis (HME) IgG: NEGATIVE

## 2022-09-16 LAB — CULTURE, BLOOD (ROUTINE X 2)
Culture: NO GROWTH
Special Requests: ADEQUATE

## 2022-09-16 NOTE — Transitions of Care (Post Inpatient/ED Visit) (Signed)
09/16/2022  Name: [name] MRN: [id number] DOB: Mar 06, 1961  Today's TOC FU Call Status: Today's TOC FU Call Status:: Successful TOC FU Call Competed TOC FU Call Complete Date: 09/16/22  Transition Care Management Follow-up Telephone Call Date of Discharge: 09/15/22 [facility] Type of Discharge: Inpatient Admission Primary Inpatient Discharge Diagnosis:: Acute Infective Myocarditis How have you been since you were released from the hospital?: Better Any questions or concerns?: No  Items Reviewed: Did you receive and understand the discharge instructions provided?: Yes Medications obtained,verified, and reconciled?: Yes (Medications Reviewed) Any new allergies since your discharge?: No Dietary orders reviewed?: Yes Type of Diet Ordered:: Heart Healthy Do you have support at home?: No (Patient's spouse has dementia and he is caregiver)  Medications Reviewed Today: Medications Reviewed Today     Reviewed by Jodelle Gross, RN (Case Manager) on 09/16/22 at 1500  Med List Status: <None>   Medication Order Taking? Sig Documenting Provider Last Dose Status Informant  amLODipine (NORVASC) 10 MG tablet [id number] Yes Take 1 tablet (10 mg total) by mouth daily. Nahser, Deloris Ping, MD Taking Active Self, Pharmacy Records  aspirin EC 81 MG tablet [id number] Yes Take 1 tablet (81 mg total) by mouth daily with breakfast. Shon Hale, MD Taking Active   atorvastatin (LIPITOR) 80 MG tablet [id number] Yes Take 1 tablet (80 mg total) by mouth at bedtime. PT NEEDS OV FOR FURTHER REFILLS, NOT SEEN SINCE 2021 [name], MD Taking Active Self, Pharmacy Records  clopidogrel (PLAVIX) 75 MG tablet [id number] Yes TAKE 1 TABLET(75 MG) BY MOUTH DAILY Nahser, Deloris Ping, MD Taking Active Self, Pharmacy Records  doxycycline (VIBRA-TABS) 100 MG tablet [id number] Yes Take 1 tablet (100 mg total) by mouth 2 (two) times daily for 10 days. Shon Hale, MD Taking Active    Evolocumab [facility] SURECLICK) 140 MG/ML Ivory Broad [id number] Yes Inject 140 mg into the skin every 14 (fourteen) days. Nahser, Deloris Ping, MD Taking Active Self, Pharmacy Records  furosemide (LASIX) 20 MG tablet [id number] No TAKE 1 TABLET BY MOUTH 3 TIMES A WEEK AS NEEDED  Patient not taking: Reported on 09/16/2022   Nahser, Deloris Ping, MD Not Taking Active Self, Pharmacy Records  gabapentin (NEURONTIN) 800 MG tablet [id number] Yes Take 800 mg by mouth 3 (three) times daily. [provider] Taking Active Self, Pharmacy Records  losartan (COZAAR) 100 MG tablet [id number] Yes Take 1 tablet (100 mg total) by mouth daily. Nahser, Deloris Ping, MD Taking Active Self, Pharmacy Records  oxyCODONE-acetaminophen [facility]) 10-325 MG tablet [id number] Yes Take 1 tablet by mouth every 6 (six) hours as needed. [provider] Taking Active Self, Pharmacy Records  sodium bicarbonate 650 MG tablet [id number] Yes Take 1 tablet (650 mg total) by mouth daily. Shon Hale, MD Taking Active             Home Care and Equipment/Supplies: Were Home Health Services Ordered?: No Any new equipment or medical supplies ordered?: No  Functional Questionnaire: Do you need assistance with bathing/showering or dressing?: No Do you need assistance with meal preparation?: No Do you need assistance with eating?: No Do you have difficulty maintaining continence: No Do you need assistance with getting out of bed/getting out of a chair/moving?: No Do you have difficulty managing or taking your medications?: No  Follow up appointments reviewed: PCP Follow-up appointment confirmed?: Yes Date of PCP follow-up appointment?: 09/24/22 Follow-up Provider: Dr. Garey Ham Follow-up appointment confirmed?: Yes Date of Specialist follow-up appointment?: 10/13/22 Follow-Up Specialty Provider::  Robin Searing, MD Do you need transportation to your follow-up appointment?: No Do you understand care options if  your condition(s) worsen?: Yes-patient verbalized understanding  SDOH Interventions Today    Flowsheet Row Most Recent Value  SDOH Interventions   Food Insecurity Interventions Intervention Not Indicated  Housing Interventions Intervention Not Indicated  Transportation Interventions Intervention Not Indicated  Utilities Interventions Intervention Not Indicated  Financial Strain Interventions Intervention Not Indicated      Interventions Today    Flowsheet Row Most Recent Value  Chronic Disease   Chronic disease during today's visit Hypertension (HTN), Chronic Obstructive Pulmonary Disease (COPD)  General Interventions   General Interventions Discussed/Reviewed General Interventions Discussed, Referral to Nurse  [Patient requested Edd Arbour, RN call him, message sent]  Nutrition Interventions   Nutrition Discussed/Reviewed Nutrition Discussed       TOC Interventions Today    Flowsheet Row Most Recent Value  TOC Interventions   TOC Interventions Discussed/Reviewed TOC Interventions Discussed, TOC Interventions Reviewed      Jodelle Gross, RN, BSN, CCM Care Management Coordinator [facility] Phone: [phone]/Fax: [phone]

## 2022-09-18 LAB — LYME DISEASE DNA BY PCR(BORRELIA BURG): Lyme Disease(B.burgdorferi)PCR: NEGATIVE

## 2022-09-18 LAB — SPOTTED FEVER GROUP ANTIBODIES
Spotted Fever Group IgG: <1:64 {titer}
Spotted Fever Group IgM: <1:64 {titer}

## 2022-09-18 LAB — CULTURE, BLOOD (ROUTINE X 2)
Culture: NO GROWTH
Special Requests: ADEQUATE
Special Requests: ADEQUATE

## 2022-09-24 ENCOUNTER — Ambulatory Visit (INDEPENDENT_AMBULATORY_CARE_PROVIDER_SITE_OTHER): Payer: Medicare HMO | Admitting: Family Medicine

## 2022-09-24 ENCOUNTER — Encounter: Payer: Self-pay | Admitting: Family Medicine

## 2022-09-24 ENCOUNTER — Ambulatory Visit: Payer: Medicare HMO | Admitting: Pharmacist

## 2022-09-24 VITALS — BP 120/72 | HR 62 | Temp 97.9°F | Ht 69.0 in | Wt 254.8 lb

## 2022-09-24 DIAGNOSIS — A770 Spotted fever due to Rickettsia rickettsii: Secondary | ICD-10-CM

## 2022-09-24 LAB — CBC WITH DIFFERENTIAL/PLATELET
HCT: 38 % — ABNORMAL LOW (ref 38.5–50.0)
Lymphs Abs: 2019 cells/uL (ref 850–3900)
MPV: 11.5 fL (ref 7.5–12.5)
RDW: 12.8 % (ref 11.0–15.0)
Total Lymphocyte: 38.1 %

## 2022-09-24 MED ORDER — DOXYCYCLINE HYCLATE 100 MG PO TABS: 100.0000 mg | ORAL_TABLET | Freq: Two times a day (BID) | ORAL | 0 refills | Status: DC

## 2022-09-24 NOTE — Progress Notes (Signed)
Care Management & Coordination Services Pharmacy Note  09/24/2022 Name:  [name] MRN:  [id number] DOB:  06-Aug-1960  Summary: PharmD FU.  No recent fill for losartan and atorvastatin.  Patient with recent ED/hospital stay for tick bite.  Reports continued shortness of breath.  Recommendations/Changes made from today's visit: Consider maintenance with LABA/LAMA if we can get it through a patient assistance program  Follow up plan: FU 6 months Adherence with inhaler if new start   Subjective: [name] is an 62 y.o. year old male who is a primary patient of Pickard, Priscille Heidelberg, MD.  The care coordination team was consulted for assistance with disease management and care coordination needs.    Engaged with patient by telephone for initial visit.  Recent office visits:  05/19/2022 OV (PCP) [name], MD; recommended trying Voltaren gel . If the protein creatinine ratio is elevated greater than 200 consider adding [place].    03/31/2022 [facility] (PCP) [name], MD; no medication changes indicated.   12/20/2021 OV (PCP) [name], MD; Begin prednisone taper pack and use Z-Pak given the purulent sputum.    Recent consult visits:  05/27/2022 [facility] (Pain Med) Arman Filter, PA-C; no medication changes indicated.   05/21/2022 [facility] (Cardiology) Nahser, Deloris Ping, MD; no medication changes indicated.   04/30/2022 OV (Orthopedics) Ellin Goodie E, NP; no medication changes indicated.   03/04/2022 [facility] (Pain Med) Arman Filter, PA-C; no medication changes indicated.   12/10/2021 [facility] (Pain Med) Arman Filter, PA-C; no medication changes indicated.   Hospital visits:  02/21/2022 ED visit for Right hand pain -Recommends Tylenol [phone] mg evert 8 hours as needed for pain   Objective:  Lab Results  Component Value Date   CREATININE 1.46 (H) 09/15/2022   BUN 25 (H) 09/15/2022   GFR 65.18 02/21/2014   EGFR 66 05/19/2022   GFRNONAA 55 (L) 09/15/2022    GFRAA 68 02/15/2020   NA 138 09/15/2022   K 3.8 09/15/2022   CALCIUM 7.7 (L) 09/15/2022   CO2 22 09/15/2022   GLUCOSE 117 (H) 09/15/2022    Lab Results  Component Value Date/Time   HGBA1C 5.9 (H) 02/15/2020 08:21 AM   HGBA1C 6.8 (H) 06/06/2019 09:11 AM   GFR 65.18 02/21/2014 08:22 AM   GFR 68.14 07/06/2012 10:51 AM    Last diabetic Eye exam: No results found for: "HMDIABEYEEXA"  Last diabetic Foot exam: No results found for: "HMDIABFOOTEX"   Lab Results  Component Value Date   CHOL 76 05/19/2022   HDL 40 05/19/2022   LDLCALC 20 05/19/2022   LDLDIRECT 117 (H) 02/23/2017   TRIG 81 05/19/2022   CHOLHDL 1.9 05/19/2022       Latest Ref Rng & Units 09/13/2022    8:46 AM 09/11/2022    4:14 PM 05/19/2022    8:20 AM  Hepatic Function  Total Protein 6.5 - 8.1 g/dL  6.8  6.9   Albumin 3.5 - 5.0 g/dL 3.2  3.9    AST 15 - 41 U/L  29  17   ALT 0 - 44 U/L  21  14   Alk Phosphatase 38 - 126 U/L  76    Total Bilirubin 0.3 - 1.2 mg/dL  1.0  0.8     Lab Results  Component Value Date/Time   TSH 2.540 06/03/2017 11:36 AM   TSH 1.204 11/04/2012 11:18 AM       Latest Ref Rng & Units 09/15/2022    4:16  AM 09/14/2022    4:31 AM 09/12/2022    4:10 AM  CBC  WBC 4.0 - 10.5 K/uL 2.5  2.5  5.1   Hemoglobin 13.0 - 17.0 g/dL 16.1  09.6  04.5   Hematocrit 39.0 - 52.0 % 38.5  38.1  39.6   Platelets 150 - 400 K/uL 65  63  109     No results found for: "VD25OH", "VITAMINB12"  Clinical ASCVD: Yes  The ASCVD Risk score (Arnett DK, et al., 2019) failed to calculate for the following reasons:   The patient has a prior MI or stroke diagnosis       05/19/2022    8:05 AM 05/15/2022   11:36 AM 05/08/2022    9:05 AM  Depression screen PHQ 2/9  Decreased Interest 0 0 0  Down, Depressed, Hopeless 0 0 0  PHQ - 2 Score 0 0 0     Social History   Tobacco Use  Smoking Status Former   Packs/day: 3.00   Years: 30.00   Additional pack years: 0.00   Total pack years: 90.00   Types: Cigarettes,  Cigars   Quit date: 01/26/2009   Years since quitting: 13.6  Smokeless Tobacco Never   BP Readings from Last 3 Encounters:  09/15/22 (!) 116/50  09/11/22 (!) 164/91  06/25/22 (!) 176/113   Pulse Readings from Last 3 Encounters:  09/15/22 (!) 50  09/11/22 77  06/25/22 60   Wt Readings from Last 3 Encounters:  09/11/22 260 lb 9.6 oz (118.2 kg)  05/21/22 262 lb 9.6 oz (119.1 kg)  05/19/22 259 lb 3.2 oz (117.6 kg)   BMI Readings from Last 3 Encounters:  09/11/22 38.48 kg/m  05/21/22 38.78 kg/m  05/19/22 38.28 kg/m    Allergies  Allergen Reactions   Rosuvastatin     Muscle aches. Other reaction(s): leg pain, Other Muscle aches. Other reaction(s): leg pain Other reaction(s): leg pain     Medications Reviewed Today     Reviewed by Erroll Luna, [facility] (Pharmacist) on 09/24/22 at 1555  Med List Status: <None>   Medication Order Taking? Sig Documenting Provider Last Dose Status Informant  amLODipine (NORVASC) 10 MG tablet [id number] No Take 1 tablet (10 mg total) by mouth daily. Nahser, Deloris Ping, MD Taking Active Self, Pharmacy Records  aspirin EC 81 MG tablet [id number] No Take 1 tablet (81 mg total) by mouth daily with breakfast. Shon Hale, MD Taking Active   atorvastatin (LIPITOR) 80 MG tablet [id number] No Take 1 tablet (80 mg total) by mouth at bedtime. PT NEEDS OV FOR FURTHER REFILLS, NOT SEEN SINCE 2021 [name], MD Taking Active Self, Pharmacy Records  clopidogrel (PLAVIX) 75 MG tablet [id number] No TAKE 1 TABLET(75 MG) BY MOUTH DAILY Nahser, Deloris Ping, MD Taking Active Self, Pharmacy Records  doxycycline (VIBRA-TABS) 100 MG tablet [id number] No Take 1 tablet (100 mg total) by mouth 2 (two) times daily for 10 days. Shon Hale, MD Taking Active   Evolocumab [facility] SURECLICK) 140 MG/ML Ivory Broad [id number] No Inject 140 mg into the skin every 14 (fourteen) days. Nahser, Deloris Ping, MD Taking Active Self, Pharmacy Records  furosemide (LASIX) 20 MG tablet  [id number] No TAKE 1 TABLET BY MOUTH 3 TIMES A WEEK AS NEEDED Nahser, Deloris Ping, MD Taking Active Self, Pharmacy Records  gabapentin (NEURONTIN) 800 MG tablet [id number] No Take 800 mg by mouth 3 (three) times daily. [provider] Taking Active Self, Pharmacy Records  losartan (COZAAR) 100 MG tablet [id number] No Take  1 tablet (100 mg total) by mouth daily. Nahser, Deloris Ping, MD Taking Active Self, Pharmacy Records  oxyCODONE-acetaminophen [facility]) 10-325 MG tablet [id number] No Take 1 tablet by mouth every 6 (six) hours as needed. [provider] Taking Active Self, Pharmacy Records  sodium bicarbonate 650 MG tablet [id number] No Take 1 tablet (650 mg total) by mouth daily. Shon Hale, MD Taking Active             SDOH:  (Social Determinants of Health) assessments and interventions performed: Yes Financial Resource Strain: Low Risk  (09/16/2022)   Overall Financial Resource Strain (CARDIA)    Difficulty of Paying Living Expenses: Not hard at all   Food Insecurity: No Food Insecurity (09/16/2022)   Hunger Vital Sign    Worried About Running Out of Food in the Last Year: Never true    Ran Out of Food in the Last Year: Never true    SDOH Interventions    Flowsheet Row Telephone from 09/16/2022 in [facility] Care Coordination from 05/15/2022 in [facility] Clinical Support from 05/08/2022 in [facility] Care Coordination from 12/25/2021 in [facility] Coord[facility] Patient Outreach Telephone from 12/18/2021 in [facility] Coord[facility] Clinical Support from 05/02/2021 in [facility]  SDOH Interventions        Food Insecurity Interventions Intervention Not Indicated Intervention Not Indicated Intervention Not Indicated Intervention Not Indicated Intervention Not Indicated Intervention Not Indicated   Housing Interventions Intervention Not Indicated Intervention Not Indicated Intervention Not Indicated -- -- Intervention Not Indicated  Transportation Interventions Intervention Not Indicated Intervention Not Indicated Intervention Not Indicated Intervention Not Indicated Intervention Not Indicated Intervention Not Indicated  Utilities Interventions Intervention Not Indicated Intervention Not Indicated Intervention Not Indicated -- -- --  Alcohol Usage Interventions -- -- Intervention Not Indicated (Score <7) -- -- --  Financial Strain Interventions Intervention Not Indicated -- Intervention Not Indicated Intervention Not Indicated -- Intervention Not Indicated  Physical Activity Interventions -- -- Intervention Not Indicated -- -- Intervention Not Indicated  Stress Interventions -- -- Intervention Not Indicated -- Other (Comment)  [care giver to disabled wife] Intervention Not Indicated  Social Connections Interventions -- -- Intervention Not Indicated -- -- Intervention Not Indicated       Medication Assistance: Application for inhaler  medication assistance program. in process.  Anticipated assistance start date unknown.  See plan of care for additional detail.  Medication Access: Within the past 30 days, how often has patient missed a dose of medication? 0 Is a pillbox or other method used to improve adherence? Yes  Factors that may affect medication adherence? financial need Are meds synced by current pharmacy? No  Are meds delivered by current pharmacy? No  Does patient experience delays in picking up medications due to transportation concerns? No   [facility] Reviewed: Is patient disadvantaged to use [facility]?: Yes  Current Rx insurance plan: [employer] Name and location of Current pharmacy:  [id number] - [place], [place] - [address] [place] [address] Phone: [phone] Fax:  [phone]  [facility] - [place], [place] - [address] [place] [address] Phone: [phone] Fax: [phone]  [id number] - [place], [place] - [address] AT [facility] [address]  [place] [address] Phone: [phone] Fax:  [phone]  [facility] services reviewed with patient today?: Yes  Patient requests to transfer care to [facility]?: No  Reason patient declined to change pharmacies: Loyalty to other pharmacy/Patient preference  Compliance/Adherence/Medication fill history: Star Rating Drugs:  Losartan 100 mg last filled 03/04/2022 90 DS     Care Gaps: Annual wellness visit in last year? Yes   Assessment/Plan   Hypertension (BP goal <130/80) -Controlled, based on home reported BP -Current treatment: Amlodipine 10mg  Appropriate, Effective, Safe, Accessible Losartan 100mg  Appropriate, Effective, Safe, Accessible -Medications previously tried: none noted  -Current home readings: 120/80 -Denies hypotensive/hypertensive symptoms -Educated on BP goals and benefits of medications for prevention of heart attack, stroke and kidney damage; Daily salt intake goal < 2300 mg; Exercise goal of 150 minutes per week; Importance of home blood pressure monitoring; -Counseled to monitor BP at home as current, document, and provide log at future appointments -Recommended to continue current medication  Hyperlipidemia: (LDL goal < 70) -Controlled, LDL is controlled at 20 most recent screening -Current treatment: Repatha 140mg  every 14 days Appropriate, Effective, Safe, Accessible Atorvastatin 80mg  Appropriate, Effective, Safe, Accessible -Medications previously tried: none ntoed  -Educated on Cholesterol goals;  Benefits of statin for ASCVD risk reduction; -Recommended to continue current medication Continue adherence with meds  COPD (Goal: control symptoms and prevent exacerbations)  09/24/22 -Not ideally controlled -Current treatment  none -Medications previously tried: Spiriva, Daileresp  -Gold Grade: Gold 4 (FEV1<40%) -Current COPD Classification:  A (low sx, 0-1 moderate exacerbations, no hospitalizations) -Pulmonary function testing: Pulmonary Functions Testing Results:  No results found for: "FEV1", "FVC", "FEV1FVC", "TLC", "DLCO"  -Exacerbations requiring treatment in last 6 months: 1 - August 2023 -Patient denies consistent use of maintenance inhaler -Frequency of rescue inhaler use: denies -COPD no maintenance.  Continues to report SOB.  He would like to be on maintenance inhaler.  History of COPD exacerbations within the year.  Patient may qualify for [place] and [place].  Would try for Bevespi inhalers.  Consult with PCP for next steps in COPD maintenance.  Hx of MI (Goal: Reduce risk of recurrence) -Controlled -Current treatment  Clopidogrel 75mg  Appropriate, Effective, Safe, Accessible -Medications previously tried: none noted -LDL well controlled and at goal for MI history  -Recommended to continue current medication Continue to monitor and screen for risk factors.  Willa Frater, PharmD, CPP Clinical Pharmacist Practitioner [facility] [phone]

## 2022-09-24 NOTE — Progress Notes (Signed)
Subjective:  Patient was recently admitted to the hospital with fever of unknown origin.  I have copied relevant portions of the discharge summary below my reference:   HPI: [name] is a 62 y.o. male with medical history significant for COPD, coronary artery disease with CABG in 2010, hypertension, peripheral vascular disease status post bilateral stent placement. Patient presented to the ED with multiple complaints of headache, difficulty breathing, left-sided chest pain, bilateral lower extremity pain, present sleepiness. Symptoms all started about 2 days ago.  Reports chest pain started while he was walking to his car garage, he describes sharp chest pain that radiates down his left arm, and has persisted since onset.  No aggravating or relieving symptoms.  Chest pain is similar to chest pain that resulted in CABG in 2010.  He has had persistent bilateral lower extremity pain from his knee downwards, but while he was walking, but has persistent despite resting.  No redness, no erythema or discoloration, no ulcers.  Reports stents to his bilateral lower extremities, and pain at that time was similar to pain today. No abdominal pain, no urinary symptoms, no vomiting or diarrhea, no cough.  Had routine dental work today, symptoms started 2 days ago.   ED Course: Febrile to 101.8.  Heart rate 43-77.  Respiratory 20-22.  Blood pressure systolic 132-159.  Lactic acid 1.9 > 1.  Troponin 29> 30.  EKG shows frequent PVCs, rate 87, QTc 428.  Creatinine elevated 2.1.  UA with rare bacteria.  Chest x-ray clear. LR 150 cc/h started.  IV ceftriaxone 1 g given for possible bacteremia. EDP talked to the cardiology Dr. Royann Shivers due to concerns for possible acute myocarditis as etiology for fever. Recommended admission, echo, cardiology team to see in consult   Review of Systems: As per HPI all other systems reviewed and negative      Hospital Course:    Brief Narrative:  62 y.o. male with medical  history significant for COPD, coronary artery disease with CABG in 2010, hypertension, peripheral vascular disease status post bilateral stent placement admitted on 09/11/2022 with FUO, AKI and chest pains   -Assessment and Plan: 1)Fever of unknown origin--POA --Recurrent fevers with--Tmax >. 101 -No leukocytosis - procalcitonin- Elevated at 0.78 on 09/11/2022 -CRP is elevated but sed rate is WNL on 09/11/2022 -Chest x-ray and UA not suggestive of infection -Blood cultures from 09/11/2022 and repeat blood cultures from 09/13/2022 NGTD -Initially treated with Vanco, Flagyl and cefepime -Doxycycline added due to history of tick bite -Patient now reports Tick Bite a couple of  weeks ago-tick bite area x 2 noted--they appear to have healed without any bull's-eye type findings -test for Lyme disease, RMSF and Ehrlichia requested and pending -Patient is now afebrile Discharge on doxycycline empirically pending further lab data - 2)AKI (acute kidney injury) (HCC) Cr elevated 2.1, baseline 1.2-1.5. Creatinine this admission 2.10 >>2.60>> 1.98>> 1.69>>1.46 ---renal function improved with hydration and with medication adjustments  -Patient is voiding well - renally adjust medications, avoid nephrotoxic agents / dehydration  / hypotension   3)Chest pain Chest pain + dyspnea with typical and atypical features.  In the setting of fever.  Troponin 29 > 30.  EKG/rhythm with frequent PVCs.  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  Follows with cardiology. -Echo from 09/12/2022 with EF of 55 to 60%, no AS and no MS -C/n aspirin, Plavix and Lipitor -Hold off on starting beta-blocker due to concerns about bradycardia -Cardiology  consult appreciated -Patient remains chest pain-free    4)Peripheral vascular disease (HCC) Reports persistent bilateral lower extremity pain.  No suggestion of infection.  Status post iliac stenting.  May be neuropathy, he is on  gabapentin. -Bilateral arterial Dopplers -Arterial ultrasound with ABI shows--Bilateral lower extremity ABIs greater than 1.4 are non-diagnostic secondary to incompressible vessel calcifications. And  Somewhat dampened arterial waveforms are noted at the right dorsalis pedis artery. --C/n aspirin, Plavix and Lipitor =-Consider outpatient follow-up with vascular team   5)COPD (chronic obstructive pulmonary disease) (HCC) Stable. -DuoNebs as needed   6)HTN- Stable. C/n  Norvasc, -We initially held losartan and Lasix due to AKI -Okay to restart both   7)Persistent headaches--- patient Now tells me that has had persistent headaches for at least 4 days -No neck pain or neck stiffness, no URI symptoms, no confusion no visual disturbance no dizziness -No focal extremity weakness or numbness and no speech or gait concerns -CT head without acute findings -neck and skin exam reassuring and low clinical index of suspicion for meningitis --Patient now reports Tick Bite a couple of  weeks ago----tick bite area x 2 noted--they appear to have healed without any bull's-eye type findings -test for Lyme disease, RMSF and Ehrlichia requested - 8)Pancytopenia--this may be infection related.... Likely tickborne Platelets trending down, anemia and leukopenia noted -Subcu heparin discontinued-due to platelet drop -Doxycycline for presumed tickborne infection  -Repeat CBC as outpatient with PCP as advised   Disposition: The patient is from: Home             --- Discharge home  09/24/22 Patient states that he developed a high fever greater than 101, diffuse bodyaches, headache, chest pain, and profound weakness.  Went to the hospital.  Chest x-ray was negative for pneumonia.  Urinalysis negative.  Culture negative.  Admitted to hospital.  Tickborne titers were drawn and the patient was started empirically on doxycycline.  Labs were significant for leukopenia and thrombocytopenia all possibly consistent with  a tickborne illness.  Labs were also significant for creatinine greater than 2 signifying dehydration.  Patient states that after he started the doxycycline, his fevers went away.  He has not had a fever since Sunday.  The chest pain has resolved.  The headaches have improved.  The body aches have improved.  He denies any rash.  Specifically he denies any neck stiffness or abdominal pain.  Tickborne titers for Lyme disease, Erlichiosis, and [place] spotted fever were negative.  Blood culture negative. 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   Chest pain    COPD (chronic obstructive pulmonary disease) (HCC)    Coronary artery disease    STATUS POST CABG 01/2009   Dyslipidemia    Hyperlipidemia    Hypertension    Orthostatic hypotension    Peripheral vascular disease (HCC)    Syncope and collapse    Past Surgical History:  Procedure Laterality Date   ANKLE SURGERY     x 2   COLONOSCOPY  April 2013   CORONARY ANGIOPLASTY     CORONARY ARTERY BYPASS GRAFT  2010   FOOT SURGERY     HERNIA REPAIR  01/23/12   umb hernia   Kidney stimulator     LEFT HEART CATH AND CORS/GRAFTS ANGIOGRAPHY N/A 06/02/2016   Procedure: Left Heart Cath and Cors/Grafts Angiography;  Surgeon: Runell Gess, MD;  Location: [facility] INVASIVE CV LAB;  Service: Cardiovascular;  Laterality: N/A;   LEFT HEART CATHETERIZATION  WITH CORONARY/GRAFT ANGIOGRAM N/A 07/08/2012   Procedure: LEFT HEART CATHETERIZATION WITH Isabel Caprice;  Surgeon: Vesta Mixer, MD;  Location: [facility] CATH LAB;  Service: Cardiovascular;  Laterality: N/A;   PR VEIN BYPASS GRAFT,AORTO-FEM-POP     STERNAL WIRES REMOVAL  07/08/2011   Procedure: STERNAL WIRES REMOVAL;  Surgeon: [name], MD;  Location: [facility] OR;  Service: Open Heart Surgery;  Laterality: N/A;  removal of 4th wire   Current Outpatient Medications on File Prior to Visit  Medication Sig Dispense Refill   amLODipine (NORVASC) 10 MG tablet  Take 1 tablet (10 mg total) by mouth daily. 90 tablet 1   aspirin EC 81 MG tablet Take 1 tablet (81 mg total) by mouth daily with breakfast. 90 tablet 3   atorvastatin (LIPITOR) 80 MG tablet Take 1 tablet (80 mg total) by mouth at bedtime. PT NEEDS OV FOR FURTHER REFILLS, NOT SEEN SINCE 2021 30 tablet 0   clopidogrel (PLAVIX) 75 MG tablet TAKE 1 TABLET(75 MG) BY MOUTH DAILY 90 tablet 2   doxycycline (VIBRA-TABS) 100 MG tablet Take 1 tablet (100 mg total) by mouth 2 (two) times daily for 10 days. 20 tablet 0   Evolocumab (REPATHA SURECLICK) 140 MG/ML SOAJ Inject 140 mg into the skin every 14 (fourteen) days. 6 mL 3   furosemide (LASIX) 20 MG tablet TAKE 1 TABLET BY MOUTH 3 TIMES A WEEK AS NEEDED 45 tablet 3   gabapentin (NEURONTIN) 800 MG tablet Take 800 mg by mouth 3 (three) times daily.     losartan (COZAAR) 100 MG tablet Take 1 tablet (100 mg total) by mouth daily. 90 tablet 0   oxyCODONE-acetaminophen (PERCOCET) 10-325 MG tablet Take 1 tablet by mouth every 6 (six) hours as needed.     sodium bicarbonate 650 MG tablet Take 1 tablet (650 mg total) by mouth daily. 30 tablet 1   No current facility-administered medications on file prior to visit.     Allergies  Allergen Reactions   Rosuvastatin     Muscle aches. Other reaction(s): leg pain, Other Muscle aches. Other reaction(s): leg pain Other reaction(s): leg pain    Social History   Socioeconomic History   Marital status: Married    Spouse name: Irving Burton   Number of children: 2   Years of education: Not on file   Highest education level: Not on file  Occupational History   Occupation: Disability  Tobacco Use   Smoking status: Former    Packs/day: 3.00    Years: 30.00    Additional pack years: 0.00    Total pack years: 90.00    Types: Cigarettes, Cigars    Quit date: 01/26/2009    Years since quitting: 13.6   Smokeless tobacco: Never  Vaping Use   Vaping Use: Never used  Substance and Sexual Activity   Alcohol use: No    Drug use: No   Sexual activity: Not Currently  Other Topics Concern   Not on file  Social History Narrative   Married x 37 years. Irving Burton)    Social Determinants of Health   Financial Resource Strain: Low Risk  (09/16/2022)   Overall Financial Resource Strain (CARDIA)    Difficulty of Paying Living Expenses: Not hard at all  Food Insecurity: No Food Insecurity (09/16/2022)   Hunger Vital Sign    Worried About Running Out of Food in the Last Year: Never true    Ran Out of Food in the Last Year: Never true  Transportation Needs: No Transportation  Needs (09/16/2022)   PRAPARE - Administrator, Civil Service (Medical): No    Lack of Transportation (Non-Medical): No  Physical Activity: Sufficiently Active (05/08/2022)   Exercise Vital Sign    Days of Exercise per Week: 5 days    Minutes of Exercise per Session: 30 min  Stress: No Stress Concern Present (05/08/2022)   [employer] of Occupational Health - Occupational Stress Questionnaire    Feeling of Stress : Not at all  Social Connections: Moderately Isolated (05/08/2022)   Social Connection and Isolation Panel [NHANES]    Frequency of Communication with Friends and Family: More than three times a week    Frequency of Social Gatherings with Friends and Family: Three times a week    Attends Religious Services: Never    Active Member of Clubs or Organizations: No    Attends Banker Meetings: Never    Marital Status: Married  Catering manager Violence: Not At Risk (09/11/2022)   Humiliation, Afraid, Rape, and Kick questionnaire    Fear of Current or Ex-Partner: No    Emotionally Abused: No    Physically Abused: No    Sexually Abused: No   Family History  Problem Relation Age of Onset   Coronary artery disease Father 34   Diabetes Father    Hyperlipidemia Father    Heart disease Father        before age 47   Hypertension Father    Cancer Mother        uterine   Hyperlipidemia Mother    Diabetes  Mother    Hypertension Mother    Hypertension Brother      Review of Systems  All other systems reviewed and are negative.      Objective:   Physical Exam Vitals reviewed.  Constitutional:      General: He is not in acute distress.    Appearance: He is well-developed. He is not diaphoretic.  HENT:     Head: Normocephalic and atraumatic.     Right Ear: External ear normal.     Left Ear: External ear normal.     Nose: Nose normal. No congestion.     Mouth/Throat:     Pharynx: No oropharyngeal exudate.  Neck:     Thyroid: No thyromegaly.     Vascular: No JVD.     Trachea: No tracheal deviation.  Cardiovascular:     Rate and Rhythm: Normal rate and regular rhythm.     Heart sounds: Normal heart sounds. No murmur heard.    No friction rub. No gallop.  Pulmonary:     Effort: Pulmonary effort is normal. No respiratory distress.     Breath sounds: No stridor. Wheezing and rhonchi present. No rales.  Chest:     Chest wall: No tenderness.  Musculoskeletal:        General: No tenderness or deformity. Normal range of motion.     Cervical back: Normal range of motion and neck supple.  Lymphadenopathy:     Cervical: No cervical adenopathy.  Skin:    General: Skin is warm.     Coloration: Skin is not pale.     Findings: No erythema or rash.  Neurological:     Mental Status: He is alert and oriented to person, place, and time.     Cranial Nerves: No cranial nerve deficit.     Motor: No abnormal muscle tone.     Coordination: Coordination normal.     Deep Tendon Reflexes: Reflexes  are normal and symmetric.  Psychiatric:        Behavior: Behavior normal.        Thought Content: Thought content normal.        Judgment: Judgment normal.           Assessment & Plan:  RMSF [facility] spotted fever) - Plan: CBC with Differential/Platelet, COMPLETE METABOLIC PANEL WITH GFR Based on his history, I suspect the patient either had a viral syndrome similar to cytomegalovirus  causing leukopenia and thrombocytopenia with a high fever and viral symptoms for a tickborne illness such as [facility] spotted fever.  Symptoms responded dramatically to doxycycline.  I have asked the patient to continue doxycycline until afebrile for 7 days straight.  I will conclude on Sunday.  I will recheck a CBC as well as a CMP.  Hopefully renal function has improved back to baseline and then seen on cytopenia and leukopenia have resolved.  If they have, plan to discontinue antibiotics on Sunday AND  as long as fever does not return no further workup is necessary

## 2022-09-25 LAB — COMPLETE METABOLIC PANEL WITH GFR
AG Ratio: 1.4 (calc) (ref 1.0–2.5)
ALT: 32 U/L (ref 9–46)
AST: 23 U/L (ref 10–35)
Albumin: 3.7 g/dL (ref 3.6–5.1)
Alkaline phosphatase (APISO): 96 U/L (ref 35–144)
BUN: 15 mg/dL (ref 7–25)
CO2: 25 mmol/L (ref 20–32)
Calcium: 9 mg/dL (ref 8.6–10.3)
Chloride: 104 mmol/L (ref 98–110)
Creat: 1.27 mg/dL (ref 0.70–1.35)
Globulin: 2.7 g/dL (calc) (ref 1.9–3.7)
Glucose, Bld: 179 mg/dL — ABNORMAL HIGH (ref 65–99)
Potassium: 3.8 mmol/L (ref 3.5–5.3)
Sodium: 142 mmol/L (ref 135–146)
Total Bilirubin: 0.6 mg/dL (ref 0.2–1.2)
Total Protein: 6.4 g/dL (ref 6.1–8.1)
eGFR: 65 mL/min/{1.73_m2} (ref >=60)

## 2022-09-25 LAB — CBC WITH DIFFERENTIAL/PLATELET
Absolute Monocytes: 509 cells/uL (ref 200–950)
Basophils Absolute: 32 cells/uL (ref 0–200)
Basophils Relative: 0.6 %
Eosinophils Absolute: 111 cells/uL (ref 15–500)
Eosinophils Relative: 2.1 %
Hemoglobin: 12.3 g/dL — ABNORMAL LOW (ref 13.2–17.1)
MCH: 27.8 pg (ref 27.0–33.0)
MCHC: 32.4 g/dL (ref 32.0–36.0)
MCV: 86 fL (ref 80.0–100.0)
Monocytes Relative: 9.6 %
Neutro Abs: 2629 cells/uL (ref 1500–7800)
Neutrophils Relative %: 49.6 %
Platelets: 258 10*3/uL (ref 140–400)
RBC: 4.42 10*6/uL (ref 4.20–5.80)
WBC: 5.3 10*3/uL (ref 3.8–10.8)

## 2022-09-30 NOTE — Progress Notes (Unsigned)
Cardiology Office Note:    Date:  10/01/2022  ID:  [name], DOB June 12, 1960, MRN [id number] PCP: [name], MD  [facility] Providers Cardiologist:  Kristeen Miss, MD       Patient Profile:      Coronary artery disease  s/p CABG in 2010 s/p Inf MI in 04/2009 >>PCI:  DES to RCA LHC 06/02/16: LIMA-LAD, SVG-RI, SVG-OM 3 patent; SVG-RCA occluded; proximal RCA stent patent, ostial RPDA 50 TTE 06/20/2019: EF 55-60, no RWMA, mod LVH, Gr 1 DD, normal RVSF, mod LAE  TTE 09/12/2022: EF 55-60, no RWMA, mild LVH, mildly reduced RVSF, normal PASP, severe LAE, moderate RAE, moderate MAC, mild AV calcification, ascending aorta 39 mm, RAP 3 PAD  s/p Iliac stenting ABIs 01/2019: normal; no focal stenosis on [place] ABIs 09/11/2022: ABIs nondiagnostic (>1.4-incompressible); somewhat dampened arterial waveforms noted on right dorsalis pedis artery Hypertension  Hyperlipidemia  OSA  Chronic Obstructive Pulmonary Disease       History of Present Illness:   [name] is a 62 y.o. male who returns for post hospital f/u. He was last seen by Dr. Elease Hashimoto 05/17/2022.  He was admitted to [facility] 5/16-5/20 with symptoms of headache, shortness of breath, chest pain, leg pain and fatigue.  He had fevers up to 101, elevated procalcitonin and CRP.  Sed rate was normal.  Cultures were negative.  He was placed on doxycycline to cover for tickborne disease.  He presented with AKI with creatinine up to 2.1.  This improved to 1.46 at discharge.  His troponins were mildly elevated without clear trend.  Echocardiogram demonstrated normal LV function and normal wall motion.  Symptoms resolved.  He was seen by cardiology.  No further testing was recommended.  Of note, he had f/u ABIs b/c of leg pain. These were nondx.    He is here alone.  Since discharge from the hospital, he has not had further chest pain.  His most recent follow-up creatinine was back to normal.  He does note shortness of breath with  exertion.  This is a fairly chronic symptom.  Over the past couple of months, his shortness of breath has been more evident.  He sleeps on 2 pillows chronically.  He does note awakening at night with shortness of breath but goes back to bed quickly.  Question if he is describing apnea.  He is intolerant of CPAP.  He has not had syncope, near syncope.  He does note bilateral leg pain that occurs after exertion.  Review of Systems  Gastrointestinal:  Negative for hematochezia and melena.  Genitourinary:  Negative for hematuria.   See the HPI    Studies Reviewed:    EKG:  not done  Risk Assessment/Calculations:             Physical Exam:   VS:  BP 130/74   Pulse (!) 45   Ht 5\' 9"  (1.753 m)   Wt 251 lb 9.6 oz (114.1 kg)   SpO2 97%   BMI 37.15 kg/m    Wt Readings from Last 3 Encounters:  10/01/22 251 lb 9.6 oz (114.1 kg)  09/24/22 254 lb 12.8 oz (115.6 kg)  09/11/22 260 lb 9.6 oz (118.2 kg)    Constitutional:      Appearance: Healthy appearance. Not in distress.  Pulmonary:     Breath sounds: Normal breath sounds. No wheezing. No rales.  Cardiovascular:     Bradycardia present. Irregular rhythm.     Murmurs: There  is no murmur.  Edema:    Peripheral edema absent.  Abdominal:     Palpations: Abdomen is soft.       ASSESSMENT AND PLAN:   PVC's (premature ventricular contractions) He was noted to have several PVCs in the hospital. I reviewed the tele strips. He has bigeminy at times. His exam today is c/w with frequent PVCs in a bigeminal pattern. He has not had syncope, near syncope. His EF was normal on echocardiogram which is reassuring. He is not on beta-blocker due to bradycardia.  3 day Zio XT to assess PVC burden If burden is high, consider referral to EP.  Peripheral vascular disease (HCC) He notes bilateral leg pain. His ABIs were non-diagnostic in the hospital. It has been 4 years since he had arterial dopplers. He had iliac stenting in 2019.  Obtain bilateral  lower extremity arterial US Continue ASA 81 mg once daily, Plavix 75 mg once daily, Atorvastatin 80 mg once daily, Repatha 140 mg every 14 days If [place] abnormal, refer back to vascular surgery (has seen Dr. Darrick Penna in the past).  Coronary artery disease S/p CABG in 2010 and inf MI in 2011 managed with DES to RCA. His cath in 2018 demonstrated patent RCA stent and 3/4 grafts patent. His cardiac enzymes in the hospital were not c/w ACS. His echocardiogram showed normal EF and no WMA. He is not having further chest pain. No further testing is needed. Continue ASA 81 mg once daily, Plavix 75 mg once daily, Atorvastatin 80 mg once daily, Repatha 140 mg every 14 days. F/u 3 mos.   Essential hypertension BP controlled. Continue Amlodipine 10 mg once daily, Losartan 100 mg once daily.  Mixed hyperlipidemia LDL in Jan 2024 optimal at 20. Continue Atorvastatin 80 mg once daily, Repatha 140 mg every 14 days.   Dyspnea He has chronic shortness of breath. I suspect this is multifactorial. He does not have signs of volume excess on exam. He does have a hx of Chronic Obstructive Pulmonary Disease. Obtain BNP today. If elevated, adjust Lasix dose. If normal, consider referral to Pulmonology.       Dispo:  Return in about 3 months (around 01/01/2023) for Routine Follow Up, w/ Dr. Elease Hashimoto, or Tereso Newcomer, PA-C.  Signed, Tereso Newcomer, PA-C

## 2022-10-01 ENCOUNTER — Telehealth: Payer: Self-pay | Admitting: *Deleted

## 2022-10-01 ENCOUNTER — Ambulatory Visit: Payer: Medicare HMO | Attending: Nurse Practitioner | Admitting: Physician Assistant

## 2022-10-01 ENCOUNTER — Encounter: Payer: Self-pay | Admitting: Physician Assistant

## 2022-10-01 ENCOUNTER — Ambulatory Visit (INDEPENDENT_AMBULATORY_CARE_PROVIDER_SITE_OTHER): Payer: Medicare HMO

## 2022-10-01 VITALS — BP 130/74 | HR 45 | Ht 69.0 in | Wt 251.6 lb

## 2022-10-01 DIAGNOSIS — I493 Ventricular premature depolarization: Secondary | ICD-10-CM

## 2022-10-01 DIAGNOSIS — I739 Peripheral vascular disease, unspecified: Secondary | ICD-10-CM | POA: Diagnosis not present

## 2022-10-01 DIAGNOSIS — I2510 Atherosclerotic heart disease of native coronary artery without angina pectoris: Secondary | ICD-10-CM

## 2022-10-01 DIAGNOSIS — R0602 Shortness of breath: Secondary | ICD-10-CM | POA: Diagnosis not present

## 2022-10-01 DIAGNOSIS — E782 Mixed hyperlipidemia: Secondary | ICD-10-CM | POA: Diagnosis not present

## 2022-10-01 DIAGNOSIS — I10 Essential (primary) hypertension: Secondary | ICD-10-CM

## 2022-10-01 HISTORY — DX: Ventricular premature depolarization: I49.3

## 2022-10-01 NOTE — Patient Instructions (Signed)
Medication Instructions:  Your physician recommends that you continue on your current medications as directed. Please refer to the Current Medication list given to you today.  *If you need a refill on your cardiac medications before your next appointment, please call your pharmacy*   Lab Work: TODAY:  PRO BNP  If you have labs (blood work) drawn today and your tests are completely normal, you will receive your results only by: MyChart Message (if you have MyChart) OR A paper copy in the mail If you have any lab test that is abnormal or we need to change your treatment, we will call you to review the results.   Testing/Procedures: Your physician has requested that you have a lower  extremity arterial duplex. This test is an ultrasound of the arteries in the legs or arms. It looks at arterial blood flow in the legs and arms. Allow one hour for Lower and Upper Arterial scans. There are no restrictions or special instructions   ZIO XT- Long Term Monitor Instructions  Your physician has requested you wear a ZIO patch monitor for 3 days.  This is a single patch monitor. [employer] supplies one patch monitor per enrollment. Additional stickers are not available. Please do not apply patch if you will be having a Nuclear Stress Test,  Echocardiogram, Cardiac CT, MRI, or Chest Xray during the period you would be wearing the  monitor. The patch cannot be worn during these tests. You cannot remove and re-apply the  ZIO XT patch monitor.  Your ZIO patch monitor will be mailed 3 day USPS to your address on file. It may take 3-5 days  to receive your monitor after you have been enrolled.  Once you have received your monitor, please review the enclosed instructions. Your monitor  has already been registered assigning a specific monitor serial # to you.  Billing and Patient Assistance Program Information  We have supplied [employer] with any of your insurance information on file for billing  purposes. [employer] offers a sliding scale Patient Assistance Program for patients that do not have  insurance, or whose insurance does not completely cover the cost of the ZIO monitor.  You must apply for the Patient Assistance Program to qualify for this discounted rate.  To apply, please call [employer] at [phone], select option 4, select option 2, ask to apply for  Patient Assistance Program. Meredeth Ide will ask your household income, and how many people  are in your household. They will quote your out-of-pocket cost based on that information.  [employer] will also be able to set up a 44-month, interest-free payment plan if needed.  Applying the monitor   Shave hair from upper left chest.  Hold abrader disc by orange tab. Rub abrader in 40 strokes over the upper left chest as  indicated in your monitor instructions.  Clean area with 4 enclosed alcohol pads. Let dry.  Apply patch as indicated in monitor instructions. Patch will be placed under collarbone on left  side of chest with arrow pointing upward.  Rub patch adhesive wings for 2 minutes. Remove white label marked "1". Remove the white  label marked "2". Rub patch adhesive wings for 2 additional minutes.  While looking in a mirror, press and release button in center of patch. A small green light will  flash 3-4 times. This will be your only indicator that the monitor has been turned on.  Do not shower for the first 24 hours. You may shower after the first 24 hours.  Press the button if you feel a symptom. You will hear a small click. Record Date, Time and  Symptom in the Patient Logbook.  When you are ready to remove the patch, follow instructions on the last 2 pages of Patient  Logbook. Stick patch monitor onto the last page of Patient Logbook.  Place Patient Logbook in the blue and white box. Use locking tab on box and tape box closed  securely. The blue and white box has prepaid postage on it. Please place it in the mailbox as  soon  as possible. Your physician should have your test results approximately 7 days after the  monitor has been mailed back to [facility].  Call [facility] Customer Care at [phone] if you have questions regarding  your ZIO XT patch monitor. Call them immediately if you see an orange light blinking on your  monitor.  If your monitor falls off in less than 4 days, contact our Monitor department at [phone].  If your monitor becomes loose or falls off after 4 days call [employer] at [phone] for  suggestions on securing your monitor    Follow-Up: At [facility], you and your health needs are our priority.  As part of our continuing mission to provide you with exceptional heart care, we have created designated Provider Care Teams.  These Care Teams include your primary Cardiologist (physician) and Advanced Practice Providers (APPs -  Physician Assistants and Nurse Practitioners) who all work together to provide you with the care you need, when you need it.  We recommend signing up for the patient portal called "MyChart".  Sign up information is provided on this After Visit Summary.  MyChart is used to connect with patients for Virtual Visits (Telemedicine).  Patients are able to view lab/test results, encounter notes, upcoming appointments, etc.  Non-urgent messages can be sent to your provider as well.   To learn more about what you can do with MyChart, go to [email or web address].    Your next appointment:   3 month(s)  Provider:   Kristeen Miss, MD  or Tereso Newcomer, PA-C         Other Instructions

## 2022-10-01 NOTE — Assessment & Plan Note (Signed)
LDL in Jan 2024 optimal at 20. Continue Atorvastatin 80 mg once daily, Repatha 140 mg every 14 days.

## 2022-10-01 NOTE — Telephone Encounter (Signed)
error 

## 2022-10-01 NOTE — Assessment & Plan Note (Signed)
S/p CABG in 2010 and inf MI in 2011 managed with DES to RCA. His cath in 2018 demonstrated patent RCA stent and 3/4 grafts patent. His cardiac enzymes in the hospital were not c/w ACS. His echocardiogram showed normal EF and no WMA. He is not having further chest pain. No further testing is needed. Continue ASA 81 mg once daily, Plavix 75 mg once daily, Atorvastatin 80 mg once daily, Repatha 140 mg every 14 days. F/u 3 mos.

## 2022-10-01 NOTE — Assessment & Plan Note (Signed)
BP controlled. Continue Amlodipine 10 mg once daily, Losartan 100 mg once daily.

## 2022-10-01 NOTE — Assessment & Plan Note (Signed)
He was noted to have several PVCs in the hospital. I reviewed the tele strips. He has bigeminy at times. His exam today is c/w with frequent PVCs in a bigeminal pattern. He has not had syncope, near syncope. His EF was normal on echocardiogram which is reassuring. He is not on beta-blocker due to bradycardia.  3 day Zio XT to assess PVC burden If burden is high, consider referral to EP.

## 2022-10-01 NOTE — Assessment & Plan Note (Signed)
He has chronic shortness of breath. I suspect this is multifactorial. He does not have signs of volume excess on exam. He does have a hx of Chronic Obstructive Pulmonary Disease. Obtain BNP today. If elevated, adjust Lasix dose. If normal, consider referral to Pulmonology.

## 2022-10-01 NOTE — Progress Notes (Unsigned)
ZIO XT serial # [id number] from office inventory applied to paitent.  Dr. Elease Hashimoto to read.

## 2022-10-01 NOTE — Assessment & Plan Note (Signed)
He notes bilateral leg pain. His ABIs were non-diagnostic in the hospital. It has been 4 years since he had arterial dopplers. He had iliac stenting in 2019.  Obtain bilateral lower extremity arterial US Continue ASA 81 mg once daily, Plavix 75 mg once daily, Atorvastatin 80 mg once daily, Repatha 140 mg every 14 days If [place] abnormal, refer back to vascular surgery (has seen Dr. Darrick Penna in the past).

## 2022-10-03 ENCOUNTER — Telehealth: Payer: Self-pay | Admitting: *Deleted

## 2022-10-03 DIAGNOSIS — Z79899 Other long term (current) drug therapy: Secondary | ICD-10-CM

## 2022-10-03 LAB — PRO B NATRIURETIC PEPTIDE: NT-Pro BNP: 2800 pg/mL — ABNORMAL HIGH (ref 0–210)

## 2022-10-03 MED ORDER — FUROSEMIDE 20 MG PO TABS: ORAL_TABLET | ORAL | 3 refills | Status: DC

## 2022-10-03 NOTE — Telephone Encounter (Signed)
-----   Message from Beatrice Lecher, [place] sent at 10/03/2022 12:35 PM EDT ----- Results sent to [name] via MyChart. See MyChart comments below. PLAN:  -Increase Lasix to 40 mg once daily x 3 days, then 20 mg once daily  -BMET Tuesday 10/07/22 -Arrange earlier f/u with me in 3-4 weeks  Mr. Stfort  Your BNP (heart failure test) is significantly elevated.  This indicates that you have excess fluid contributing to your shortness of breath.  I will increase your furosemide (Lasix) to 40 mg daily for 3 days, then decrease this to 20 mg once daily. I will get a follow-up lab test to recheck your kidney function and potassium early next week.  I will also get you back in for earlier follow-up in the next several weeks. Tereso Newcomer, PA-C

## 2022-10-03 NOTE — Telephone Encounter (Signed)
Spoke to Pt about Tereso Newcomer, [place] plan.  Told Pt to increase furosemide (Lasix) to 40 mg once a day for the next 3 days and then 20 mg once daily there after. Informed him of the BMET lab scheduled on 10/07/22 and the earlier follow up appointment with Tereso Newcomer, PA on 10/24/2022 at 8:25 am. Pt expressed his PCP had him on furosemide as needed. I told him Tereso Newcomer, [place] would like him to take the 20 mg daily after he finishes the 40 mg daily dose for 3 days and he could discuss further plans with Tereso Newcomer at the 10/24/22 appointment. Mr. Smith also asked when he was to send in his Zio monitor.  He was instructed to wear the monitor for the complete 3 days as scheduled and then he can proceed with packing it up and mailing it out on Monday. Pt stated his understanding to all and was instructed to call us back if he thought of any additional questions.

## 2022-10-03 NOTE — Telephone Encounter (Signed)
Patient states he is returning a call. 

## 2022-10-07 ENCOUNTER — Ambulatory Visit: Payer: Medicare HMO | Attending: Physician Assistant

## 2022-10-07 DIAGNOSIS — Z79899 Other long term (current) drug therapy: Secondary | ICD-10-CM | POA: Diagnosis not present

## 2022-10-07 LAB — CBC
HCT: 38.5 % — ABNORMAL LOW (ref 39.0–52.0)
MCH: 27.8 pg (ref 26.0–34.0)
MCHC: 31.9 g/dL (ref 30.0–36.0)
MCV: 86.9 fL (ref 80.0–100.0)
Platelets: 65 10*3/uL — ABNORMAL LOW (ref 150–400)
RBC: 4.43 MIL/uL (ref 4.22–5.81)
WBC: 2.5 10*3/uL — ABNORMAL LOW (ref 4.0–10.5)
nRBC: 0 % (ref 0.0–0.2)

## 2022-10-08 ENCOUNTER — Telehealth: Payer: Self-pay | Admitting: *Deleted

## 2022-10-08 ENCOUNTER — Other Ambulatory Visit: Payer: Self-pay | Admitting: *Deleted

## 2022-10-08 DIAGNOSIS — Z79899 Other long term (current) drug therapy: Secondary | ICD-10-CM

## 2022-10-08 LAB — BASIC METABOLIC PANEL
BUN/Creatinine Ratio: 10 (ref 10–24)
BUN: 17 mg/dL (ref 8–27)
CO2: 23 mmol/L (ref 20–29)
Calcium: 9.7 mg/dL (ref 8.6–10.2)
Chloride: 96 mmol/L (ref 96–106)
Creatinine, Ser: 1.7 mg/dL — ABNORMAL HIGH (ref 0.76–1.27)
Glucose: 151 mg/dL — ABNORMAL HIGH (ref 70–99)
Potassium: 3.7 mmol/L (ref 3.5–5.2)
Sodium: 140 mmol/L (ref 134–144)
eGFR: 46 mL/min/{1.73_m2} — ABNORMAL LOW (ref >59)

## 2022-10-08 MED ORDER — FUROSEMIDE 20 MG PO TABS: 20.0000 mg | ORAL_TABLET | ORAL | 3 refills | Status: AC

## 2022-10-08 NOTE — Telephone Encounter (Signed)
-----   Message from Beatrice Lecher, [place] sent at 10/08/2022 11:04 AM EDT ----- Results sent to [name] via MyChart. See MyChart comments below. PLAN:  -Hold Lasix x 1 day, then resume Lasix 20 mg every other day. -Weigh daily. Take extra Lasix 20 mg if wt increases > 3 lbs in 1 day or if he has significant increase in swelling. -BMET 1 week  Mr. Steege  Your creatinine (kidney function) has increased somewhat.  Your potassium is normal.  I would like for you to not take furosemide for 1 day, then resume furosemide 20 mg every other day.  We will recheck another lab test in 1 week.  If you have weight gain of more than 3 pounds in 1 day or increasing swelling, you can take an extra furosemide 20 mg that day. Tereso Newcomer, PA-C

## 2022-10-13 ENCOUNTER — Encounter: Payer: Self-pay | Admitting: Physician Assistant

## 2022-10-13 ENCOUNTER — Ambulatory Visit: Payer: Medicare HMO | Admitting: Nurse Practitioner

## 2022-10-13 DIAGNOSIS — I493 Ventricular premature depolarization: Secondary | ICD-10-CM | POA: Diagnosis not present

## 2022-10-13 DIAGNOSIS — I2510 Atherosclerotic heart disease of native coronary artery without angina pectoris: Secondary | ICD-10-CM | POA: Diagnosis not present

## 2022-10-14 ENCOUNTER — Telehealth: Payer: Self-pay | Admitting: *Deleted

## 2022-10-14 ENCOUNTER — Other Ambulatory Visit (HOSPITAL_COMMUNITY): Payer: Self-pay | Admitting: Physician Assistant

## 2022-10-14 DIAGNOSIS — I493 Ventricular premature depolarization: Secondary | ICD-10-CM

## 2022-10-14 DIAGNOSIS — Z95828 Presence of other vascular implants and grafts: Secondary | ICD-10-CM

## 2022-10-14 DIAGNOSIS — I739 Peripheral vascular disease, unspecified: Secondary | ICD-10-CM

## 2022-10-14 NOTE — Telephone Encounter (Signed)
-----   Message from Beatrice Lecher, [place] sent at 10/13/2022  8:39 PM EDT ----- Results sent via MyChart. See comments.  I will send a copy to Allen Brooks, MD as Lorain Childes. PLAN:  -Refer to EP for PVCs  [name]  Your monitor shows a high percentage (19.1%) of premature ventricular contractions (PVCs).  Too many PVCs can cause problems with your heart.  I will refer you to one of our electrophysiologists to help manage this further.  These are our heart rhythm doctors. Allen Newcomer, PA-C    10/13/2022 8:34 PM

## 2022-10-15 ENCOUNTER — Other Ambulatory Visit: Payer: Self-pay | Admitting: *Deleted

## 2022-10-15 DIAGNOSIS — Z79899 Other long term (current) drug therapy: Secondary | ICD-10-CM

## 2022-10-15 NOTE — Addendum Note (Signed)
Addended by: Burnetta Sabin on: 10/15/2022 09:07 AM   Modules accepted: Orders

## 2022-10-16 ENCOUNTER — Telehealth: Payer: Self-pay | Admitting: Cardiovascular Disease

## 2022-10-16 ENCOUNTER — Other Ambulatory Visit (HOSPITAL_COMMUNITY)
Admission: RE | Admit: 2022-10-16 | Discharge: 2022-10-16 | Disposition: A | Discharge status: Home / Self Care | Payer: Medicare HMO | Source: Ambulatory Visit | Attending: Physician Assistant | Admitting: Physician Assistant

## 2022-10-16 ENCOUNTER — Inpatient Hospital Stay (HOSPITAL_COMMUNITY): Admission: RE | Admit: 2022-10-16 | Payer: Medicare HMO | Source: Ambulatory Visit

## 2022-10-16 DIAGNOSIS — R7989 Other specified abnormal findings of blood chemistry: Secondary | ICD-10-CM

## 2022-10-16 DIAGNOSIS — Z79899 Other long term (current) drug therapy: Secondary | ICD-10-CM | POA: Insufficient documentation

## 2022-10-16 DIAGNOSIS — I10 Essential (primary) hypertension: Secondary | ICD-10-CM | POA: Insufficient documentation

## 2022-10-16 LAB — BASIC METABOLIC PANEL
Anion gap: 11 (ref 5–15)
BUN: 14 mg/dL (ref 6–20)
CO2: 24 mmol/L (ref 22–32)
Calcium: 8.7 mg/dL — ABNORMAL LOW (ref 8.9–10.3)
Chloride: 103 mmol/L (ref 98–111)
Creatinine, Ser: 1.47 mg/dL — ABNORMAL HIGH (ref 0.61–1.24)
GFR, Estimated: 54 mL/min — ABNORMAL LOW (ref >60)
Glucose, Bld: 207 mg/dL — ABNORMAL HIGH (ref 70–99)
Potassium: 3.9 mmol/L (ref 3.5–5.1)
Sodium: 138 mmol/L (ref 135–145)

## 2022-10-16 NOTE — Telephone Encounter (Signed)
Per Tereso Newcomer PA,  lab results on 10/07/22. Patient needs repeat BMET in 1 week due to elevated creatinine and medication changes. Placed order for lab work to be done at [facility].

## 2022-10-16 NOTE — Telephone Encounter (Signed)
[facility] lab called stating patient was there for is, they need order put in Epic.

## 2022-10-23 ENCOUNTER — Encounter: Payer: Self-pay | Admitting: Physician Assistant

## 2022-10-23 DIAGNOSIS — I5030 Unspecified diastolic (congestive) heart failure: Secondary | ICD-10-CM

## 2022-10-23 HISTORY — DX: Unspecified diastolic (congestive) heart failure: I50.30

## 2022-10-23 NOTE — Progress Notes (Addendum)
Cardiology Office Note:    Date:  10/24/2022  ID:  [name], DOB 1960/10/22, MRN [id number] PCP: [name], MD  [facility] Providers Cardiologist:  Kristeen Miss, MD       Patient Profile:      Coronary artery disease  s/p CABG in 2010 s/p Inf MI in 04/2009 >>PCI:  DES to RCA LHC 06/02/16: LIMA-LAD, SVG-RI, SVG-OM 3 patent; SVG-RCA occluded; proximal RCA stent patent, ostial RPDA 50 (HFpEF) heart failure with preserved ejection fraction  TTE 06/20/2019: EF 55-60, no RWMA, mod LVH, Gr 1 DD, normal RVSF, mod LAE  TTE 09/12/2022: EF 55-60, no RWMA, mild LVH, mildly reduced RVSF, normal PASP, severe LAE, moderate RAE, moderate MAC, mild AV calcification, ascending aorta 39 mm, RAP 3 PAD  s/p Iliac stenting ABIs 01/2019: normal; no focal stenosis on [place] ABIs 09/11/2022: ABIs nondiagnostic (>1.4-incompressible); somewhat dampened arterial waveforms noted on right dorsalis pedis artery Hypertension  Hyperlipidemia  OSA  Chronic Obstructive Pulmonary Disease       History of Present Illness:   [name] is a 62 y.o. male who returns for follow-up of CHF, CAD, PVCs.  He was last seen 10/01/2022 in follow-up after hospitalization in May with likely tickborne illness.  He had improved symptoms with antibiotic therapy.  At his follow-up visit, he noted shortness of breath with exertion.  A BNP was obtained and was significantly elevated at 2800.  I adjusted his furosemide and brought him back for earlier follow-up.  His creatinine did increase somewhat but stabilized after reducing his dose of furosemide.  Of note, he had a ZIO patch monitor that demonstrated 19% PVCs.  He has been referred to EP for further evaluation.  He is here alone.  He is less short of breath with exertion.  He still has orthopnea.  He has not had leg edema.  He has not had chest pain, syncope, palpitations.  His weight is down 2 pounds.  Review of Systems  Gastrointestinal:  Negative for hematochezia and  melena.  Genitourinary:  Negative for hematuria.   see HPI    Studies Reviewed:   EKG Interpretation Date/Time:  Friday October 24 2022 08:18:55 EDT Ventricular Rate:  66 PR Interval:  142 QRS Duration:  98 QT Interval:  450 QTC Calculation: 471 R Axis:   -2  Text Interpretation: Normal sinus rhythm Inferior Q waves noted Inferolateral TW inversions Prolonged QT Similar to prior tracing 09/11/22 Confirmed by Tereso Newcomer [phone]) on 10/24/2022 8:32:29 AM    Risk Assessment/Calculations:           Physical Exam:   VS:  BP 118/70   Pulse 60   Ht 5\' 9"  (1.753 m)   Wt 249 lb 9.6 oz (113.2 kg)   SpO2 96%   BMI 36.86 kg/m    Wt Readings from Last 3 Encounters:  10/24/22 249 lb 9.6 oz (113.2 kg)  10/01/22 251 lb 9.6 oz (114.1 kg)  09/24/22 254 lb 12.8 oz (115.6 kg)    Constitutional:      Appearance: Healthy appearance. Not in distress.  Neck:     Vascular: JVD normal.  Pulmonary:     Breath sounds: Normal breath sounds. No wheezing. No rales.  Cardiovascular:     Normal rate. Regular rhythm.     Murmurs: There is no murmur.  Edema:    Peripheral edema absent.  Abdominal:     Palpations: Abdomen is soft.      ASSESSMENT AND PLAN:   (  HFpEF) heart failure with preserved ejection fraction (HCC) NYHA II.  Volume status appears stable on exam.  However, he still has symptoms that sound consistent with orthopnea.  With his baseline renal dysfunction, I am somewhat concerned about placing him on spironolactone.  However, he may benefit from the addition of SGLT2 inhibitor.  We discussed the rationale for this drug and its mechanism of action.  He would like to try it. Continue furosemide 20 mg every other day Start Jardiance 10 mg daily BMET 2 weeks Follow-up 3 months  Coronary artery disease S/p CABG in 2010 and inf MI in 2011 managed with DES to RCA. His cath in 2018 demonstrated patent RCA stent and 3/4 grafts patent.  He is doing well without chest pain to suggest angina.   Continue ASA 81 mg once daily, Plavix 75 mg once daily, Atorvastatin 80 mg once daily, Repatha 140 mg every 14 days. F/u 3 mos.   PVC's (premature ventricular contractions) 19% PVCs on recent monitor.  EF normal.  EP evaluation is pending.  Essential hypertension Blood pressure is well-controlled.  Continue amlodipine 10 mg daily, losartan 100 mg daily.  CKD (chronic kidney disease) stage 3, GFR 30-59 ml/min (HCC) Creatinine stable by labs 10/16/2022 (1.47).      Dispo:  Return in about 3 months (around 01/24/2023) for Routine Follow Up, w/ Tereso Newcomer, PA-C.  Signed, Tereso Newcomer, PA-C

## 2022-10-24 ENCOUNTER — Other Ambulatory Visit: Payer: Self-pay | Admitting: *Deleted

## 2022-10-24 ENCOUNTER — Encounter: Payer: Self-pay | Admitting: Physician Assistant

## 2022-10-24 ENCOUNTER — Ambulatory Visit: Payer: Medicare HMO | Attending: Physician Assistant | Admitting: Physician Assistant

## 2022-10-24 VITALS — BP 118/70 | HR 60 | Ht 69.0 in | Wt 249.6 lb

## 2022-10-24 DIAGNOSIS — I493 Ventricular premature depolarization: Secondary | ICD-10-CM | POA: Diagnosis not present

## 2022-10-24 DIAGNOSIS — I5032 Chronic diastolic (congestive) heart failure: Secondary | ICD-10-CM

## 2022-10-24 DIAGNOSIS — I2510 Atherosclerotic heart disease of native coronary artery without angina pectoris: Secondary | ICD-10-CM | POA: Diagnosis not present

## 2022-10-24 DIAGNOSIS — I10 Essential (primary) hypertension: Secondary | ICD-10-CM | POA: Diagnosis not present

## 2022-10-24 DIAGNOSIS — N1831 Chronic kidney disease, stage 3a: Secondary | ICD-10-CM | POA: Diagnosis not present

## 2022-10-24 DIAGNOSIS — N183 Chronic kidney disease, stage 3 unspecified: Secondary | ICD-10-CM | POA: Insufficient documentation

## 2022-10-24 MED ORDER — EMPAGLIFLOZIN 10 MG PO TABS: 10.0000 mg | ORAL_TABLET | Freq: Every day | ORAL | 0 refills | Status: DC

## 2022-10-24 MED ORDER — EMPAGLIFLOZIN 10 MG PO TABS: 10.0000 mg | ORAL_TABLET | Freq: Every day | ORAL | 3 refills | Status: DC

## 2022-10-24 NOTE — Patient Instructions (Addendum)
Medication Instructions:  Your physician has recommended you make the following change in your medication:   START Jardiance 10 mg taking 1 daily   *If you need a refill on your cardiac medications before your next appointment, please call your pharmacy*   Lab Work: 2 WEEKS:  GO TO [place], TAKE YOUR LAB SLIPS WITH YOU FOR A:  BMET  If you have labs (blood work) drawn today and your tests are completely normal, you will receive your results only by: MyChart Message (if you have MyChart) OR A paper copy in the mail If you have any lab test that is abnormal or we need to change your treatment, we will call you to review the results.   Testing/Procedures: None ordered   Follow-Up: At [facility], you and your health needs are our priority.  As part of our continuing mission to provide you with exceptional heart care, we have created designated Provider Care Teams.  These Care Teams include your primary Cardiologist (physician) and Advanced Practice Providers (APPs -  Physician Assistants and Nurse Practitioners) who all work together to provide you with the care you need, when you need it.  We recommend signing up for the patient portal called "MyChart".  Sign up information is provided on this After Visit Summary.  MyChart is used to connect with patients for Virtual Visits (Telemedicine).  Patients are able to view lab/test results, encounter notes, upcoming appointments, etc.  Non-urgent messages can be sent to your provider as well.   To learn more about what you can do with MyChart, go to [email or web address].    Your next appointment:   KEEP ALL SCHEDULED APPOINTMENTS   Provider:   Tereso Newcomer, PA-C         Other Instructions

## 2022-10-24 NOTE — Assessment & Plan Note (Signed)
S/p CABG in 2010 and inf MI in 2011 managed with DES to RCA. His cath in 2018 demonstrated patent RCA stent and 3/4 grafts patent.  He is doing well without chest pain to suggest angina.  Continue ASA 81 mg once daily, Plavix 75 mg once daily, Atorvastatin 80 mg once daily, Repatha 140 mg every 14 days. F/u 3 mos.

## 2022-10-24 NOTE — Assessment & Plan Note (Signed)
Creatinine stable by labs 10/16/2022 (1.47).

## 2022-10-24 NOTE — Assessment & Plan Note (Signed)
NYHA II.  Volume status appears stable on exam.  However, he still has symptoms that sound consistent with orthopnea.  With his baseline renal dysfunction, I am somewhat concerned about placing him on spironolactone.  However, he may benefit from the addition of SGLT2 inhibitor.  We discussed the rationale for this drug and its mechanism of action.  He would like to try it. Continue furosemide 20 mg every other day Start Jardiance 10 mg daily BMET 2 weeks Follow-up 3 months

## 2022-10-24 NOTE — Assessment & Plan Note (Signed)
19% PVCs on recent monitor.  EF normal.  EP evaluation is pending.

## 2022-10-24 NOTE — Assessment & Plan Note (Signed)
Blood pressure is well-controlled.  Continue amlodipine 10 mg daily, losartan 100 mg daily.

## 2022-10-28 ENCOUNTER — Ambulatory Visit (HOSPITAL_COMMUNITY)
Admission: RE | Admit: 2022-10-28 | Discharge: 2022-10-28 | Disposition: A | Discharge status: Home / Self Care | Payer: Medicare HMO | Source: Ambulatory Visit | Attending: Cardiology | Admitting: Cardiology

## 2022-10-28 ENCOUNTER — Telehealth: Payer: Self-pay | Admitting: *Deleted

## 2022-10-28 ENCOUNTER — Ambulatory Visit (HOSPITAL_BASED_OUTPATIENT_CLINIC_OR_DEPARTMENT_OTHER)
Admission: RE | Admit: 2022-10-28 | Discharge: 2022-10-28 | Disposition: A | Discharge status: Home / Self Care | Payer: Medicare HMO | Source: Ambulatory Visit | Attending: Cardiology | Admitting: Cardiology

## 2022-10-28 DIAGNOSIS — I5032 Chronic diastolic (congestive) heart failure: Secondary | ICD-10-CM | POA: Diagnosis not present

## 2022-10-28 DIAGNOSIS — I739 Peripheral vascular disease, unspecified: Secondary | ICD-10-CM | POA: Insufficient documentation

## 2022-10-28 DIAGNOSIS — Z95828 Presence of other vascular implants and grafts: Secondary | ICD-10-CM | POA: Insufficient documentation

## 2022-10-28 DIAGNOSIS — I10 Essential (primary) hypertension: Secondary | ICD-10-CM | POA: Diagnosis not present

## 2022-10-28 DIAGNOSIS — Z79899 Other long term (current) drug therapy: Secondary | ICD-10-CM

## 2022-10-28 DIAGNOSIS — I493 Ventricular premature depolarization: Secondary | ICD-10-CM | POA: Diagnosis not present

## 2022-10-28 DIAGNOSIS — I2510 Atherosclerotic heart disease of native coronary artery without angina pectoris: Secondary | ICD-10-CM | POA: Diagnosis not present

## 2022-10-28 LAB — BASIC METABOLIC PANEL
BUN/Creatinine Ratio: 13 (ref 10–24)
BUN: 21 mg/dL (ref 8–27)
CO2: 25 mmol/L (ref 20–29)
Calcium: 9.6 mg/dL (ref 8.6–10.2)
Chloride: 105 mmol/L (ref 96–106)
Creatinine, Ser: 1.57 mg/dL — ABNORMAL HIGH (ref 0.76–1.27)
Glucose: 122 mg/dL — ABNORMAL HIGH (ref 70–99)
Potassium: 4.4 mmol/L (ref 3.5–5.2)
Sodium: 143 mmol/L (ref 134–144)
eGFR: 50 mL/min/{1.73_m2} — ABNORMAL LOW (ref >59)

## 2022-10-28 LAB — VAS US ABI WITH/WO TBI
Left ABI: 1.1
Right ABI: 1.09

## 2022-10-28 NOTE — Telephone Encounter (Signed)
Pt had labs done at [facility] today, which was too early.  Per pt he would rather come here anyway for labs so he scheduled for 11/07/22 (2 weeks after starting Jardiance)

## 2022-10-29 ENCOUNTER — Other Ambulatory Visit: Payer: Self-pay | Admitting: Cardiovascular Disease

## 2022-10-29 DIAGNOSIS — I10 Essential (primary) hypertension: Secondary | ICD-10-CM

## 2022-11-04 ENCOUNTER — Telehealth: Payer: Self-pay | Admitting: *Deleted

## 2022-11-04 DIAGNOSIS — Z95828 Presence of other vascular implants and grafts: Secondary | ICD-10-CM

## 2022-11-04 DIAGNOSIS — I739 Peripheral vascular disease, unspecified: Secondary | ICD-10-CM

## 2022-11-04 NOTE — Telephone Encounter (Signed)
-----   Message from [name], [place] sent at 10/31/2022  1:19 PM EDT ----- Results sent to [name] via MyChart. See MyChart comments below. PLAN:  -Repeat ABIs, LE arterial US 1 year.  [name]  Your ABIs and lower extremity arterial ultrasound study shows plaque buildup (atherosclerosis) but no significant obstruction (stenosis). Findings are overall stable. We can repeat this again in 1 year to monitor. Continue current medications/treatment plan and follow up as scheduled.  Allen Newcomer, PA-C

## 2022-11-04 NOTE — Telephone Encounter (Signed)
-----   Message from Beatrice Lecher, [place] sent at 11/04/2022  8:14 AM EDT ----- Repeat the same test (Vas US Aorta/IVC/ILIACS) Thanks, Tereso Newcomer, PA-C    11/04/2022 8:14 AM

## 2022-11-07 ENCOUNTER — Ambulatory Visit: Payer: Medicare HMO

## 2022-11-11 ENCOUNTER — Other Ambulatory Visit: Payer: Self-pay | Admitting: *Deleted

## 2022-11-11 DIAGNOSIS — Z87891 Personal history of nicotine dependence: Secondary | ICD-10-CM

## 2022-11-11 DIAGNOSIS — Z122 Encounter for screening for malignant neoplasm of respiratory organs: Secondary | ICD-10-CM

## 2022-11-24 DIAGNOSIS — Z79899 Other long term (current) drug therapy: Secondary | ICD-10-CM | POA: Diagnosis not present

## 2022-11-24 DIAGNOSIS — G8921 Chronic pain due to trauma: Secondary | ICD-10-CM | POA: Diagnosis not present

## 2022-11-24 DIAGNOSIS — Z5181 Encounter for therapeutic drug level monitoring: Secondary | ICD-10-CM | POA: Diagnosis not present

## 2022-11-26 ENCOUNTER — Ambulatory Visit: Payer: Medicare HMO | Attending: Internal Medicine | Admitting: Internal Medicine

## 2022-11-26 ENCOUNTER — Ambulatory Visit: Payer: Medicare HMO

## 2022-11-26 ENCOUNTER — Encounter: Payer: Self-pay | Admitting: Internal Medicine

## 2022-11-26 VITALS — BP 126/66 | HR 42 | Ht 69.0 in | Wt 245.8 lb

## 2022-11-26 DIAGNOSIS — I493 Ventricular premature depolarization: Secondary | ICD-10-CM

## 2022-11-26 DIAGNOSIS — Z79899 Other long term (current) drug therapy: Secondary | ICD-10-CM | POA: Diagnosis not present

## 2022-11-26 NOTE — Patient Instructions (Addendum)
Medication Instructions:  Your physician recommends that you continue on your current medications as directed. Please refer to the Current Medication list given to you today.  *If you need a refill on your cardiac medications before your next appointment, please call your pharmacy*  Lab Work: None ordered today.  Testing/Procedures: None ordered today.  Follow-Up: At [facility], you and your health needs are our priority.  As part of our continuing mission to provide you with exceptional heart care, we have created designated Provider Care Teams.  These Care Teams include your primary Cardiologist (physician) and Advanced Practice Providers (APPs -  Physician Assistants and Nurse Practitioners) who all work together to provide you with the care you need, when you need it.  Your next appointment:   As needed  The format for your next appointment:   In Person  Provider:   You may see Lewayne Bunting, MD or one of the following Advanced Practice Providers on your designated Care Team:   Francis Dowse, [place] [address] [place], [place] Sherie Don, NP Canary Brim, NP{

## 2022-11-26 NOTE — Progress Notes (Signed)
HPI [name] is referred by Tereso Newcomer for evaluation of PVC's. He is a pleasant 62 yo man with CAD, s/p CABG, preserved LV function, and peripheral vascular disease. He does not have palpitations. He has dyspnea with exertion (mild) and c/o PND and orthopnea. He is s/p iliac stenting. He wore a cardiac monitor which demonstrated about 20% PVC's. Review of the 12 lead ecg suggests that the PVC's are coming from the septal floor of the LV. He does not have palpitations.  Allergies  Allergen Reactions   Rosuvastatin     Muscle aches. Other reaction(s): leg pain, Other Muscle aches. Other reaction(s): leg pain Other reaction(s): leg pain      Current Outpatient Medications  Medication Sig Dispense Refill   amLODipine (NORVASC) 10 MG tablet TAKE 1 TABLET(10 MG) BY MOUTH DAILY 90 tablet 1   aspirin EC 81 MG tablet Take 1 tablet (81 mg total) by mouth daily with breakfast. 90 tablet 3   atorvastatin (LIPITOR) 80 MG tablet Take 1 tablet (80 mg total) by mouth at bedtime. PT NEEDS OV FOR FURTHER REFILLS, NOT SEEN SINCE 2021 30 tablet 0   clopidogrel (PLAVIX) 75 MG tablet TAKE 1 TABLET(75 MG) BY MOUTH DAILY 90 tablet 2   doxycycline (VIBRA-TABS) 100 MG tablet Take 1 tablet (100 mg total) by mouth 2 (two) times daily. 14 tablet 0   empagliflozin (JARDIANCE) 10 MG TABS tablet Take 1 tablet (10 mg total) by mouth daily before breakfast. 90 tablet 3   empagliflozin (JARDIANCE) 10 MG TABS tablet Take 1 tablet (10 mg total) by mouth daily before breakfast. 28 tablet 0   Evolocumab (REPATHA SURECLICK) 140 MG/ML SOAJ Inject 140 mg into the skin every 14 (fourteen) days. 6 mL 3   furosemide (LASIX) 20 MG tablet Take 1 tablet (20 mg total) by mouth every other day. 90 tablet 3   gabapentin (NEURONTIN) 800 MG tablet Take 800 mg by mouth 3 (three) times daily.     losartan (COZAAR) 100 MG tablet Take 1 tablet (100 mg total) by mouth daily. 90 tablet 0   oxyCODONE-acetaminophen (PERCOCET) 10-325  MG tablet Take 1 tablet by mouth every 6 (six) hours as needed.     sodium bicarbonate 650 MG tablet Take 1 tablet (650 mg total) by mouth daily. 30 tablet 1   No current facility-administered medications for this visit.     Past Medical History:  Diagnosis Date   (HFpEF) heart failure with preserved ejection fraction (HCC) 10/23/2022   TTE 09/12/2022: EF 55-60, no RWMA, mild LVH, mildly reduced RVSF, normal PASP, severe LAE, moderate RAE, moderate MAC, mild AV calcification, ascending aorta 39 mm, RAP 3   Acute MI, 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   Chest pain    COPD (chronic obstructive pulmonary disease) (HCC)    Coronary artery disease    STATUS POST CABG 01/2009   Dyslipidemia    Hyperlipidemia    Hypertension    Orthostatic hypotension    Peripheral vascular disease (HCC)    PVC's (premature ventricular contractions) 10/01/2022   Zio 09/2022: 19.1% PVCs, Avg HR 63  TTE 08/2022: EF 55-60   Syncope and collapse     ROS:   All systems reviewed and negative except as noted in the HPI.   Past Surgical History:  Procedure Laterality Date   ANKLE SURGERY     x 2   COLONOSCOPY  April 2013   CORONARY ANGIOPLASTY     CORONARY ARTERY BYPASS GRAFT  2010   FOOT SURGERY     HERNIA REPAIR  01/23/12   umb hernia   Kidney stimulator     LEFT HEART CATH AND CORS/GRAFTS ANGIOGRAPHY N/A 06/02/2016   Procedure: Left Heart Cath and Cors/Grafts Angiography;  Surgeon: Runell Gess, MD;  Location: [facility] INVASIVE CV LAB;  Service: Cardiovascular;  Laterality: N/A;   LEFT HEART CATHETERIZATION WITH CORONARY/GRAFT ANGIOGRAM N/A 07/08/2012   Procedure: LEFT HEART CATHETERIZATION WITH Isabel Caprice;  Surgeon: Vesta Mixer, MD;  Location: [facility] CATH LAB;  Service: Cardiovascular;  Laterality: N/A;   PR VEIN BYPASS GRAFT,AORTO-FEM-POP     STERNAL WIRES REMOVAL  07/08/2011   Procedure: STERNAL WIRES REMOVAL;  Surgeon: [name], MD;  Location: [facility] OR;  Service: Open Heart Surgery;   Laterality: N/A;  removal of 4th wire     Family History  Problem Relation Age of Onset   Coronary artery disease Father 55   Diabetes Father    Hyperlipidemia Father    Heart disease Father        before age 75   Hypertension Father    Cancer Mother        uterine   Hyperlipidemia Mother    Diabetes Mother    Hypertension Mother    Hypertension Brother      Social History   Socioeconomic History   Marital status: Married    Spouse name: [name]   Number of children: 2   Years of education: Not on file   Highest education level: Not on file  Occupational History   Occupation: Disability  Tobacco Use   Smoking status: Former    Current packs/day: 0.00    Average packs/day: 3.0 packs/day for 30.0 years (90.0 ttl pk-yrs)    Types: Cigarettes, Cigars    Start date: 01/27/1979    Quit date: 01/26/2009    Years since quitting: 13.8   Smokeless tobacco: Never  Vaping Use   Vaping status: Never Used  Substance and Sexual Activity   Alcohol use: No   Drug use: No   Sexual activity: Not Currently  Other Topics Concern   Not on file  Social History Narrative   Married x 37 years. [name])    Social Determinants of Health   Financial Resource Strain: Low Risk  (09/16/2022)   Overall Financial Resource Strain (CARDIA)    Difficulty of Paying Living Expenses: Not hard at all  Food Insecurity: No Food Insecurity (09/16/2022)   Hunger Vital Sign    Worried About Running Out of Food in the Last Year: Never true    Ran Out of Food in the Last Year: Never true  Transportation Needs: No Transportation Needs (09/16/2022)   PRAPARE - Administrator, Civil Service (Medical): No    Lack of Transportation (Non-Medical): No  Physical Activity: Sufficiently Active (05/08/2022)   Exercise Vital Sign    Days of Exercise per Week: 5 days    Minutes of Exercise per Session: 30 min  Stress: No Stress Concern Present (05/08/2022)   [employer] of Occupational Health -  Occupational Stress Questionnaire    Feeling of Stress : Not at all  Social Connections: Moderately Isolated (05/08/2022)   Social Connection and Isolation Panel [NHANES]    Frequency of Communication with Friends and Family: More than three times a week    Frequency of Social Gatherings with Friends and Family: Three times a week    Attends Religious Services: Never    Active Member of  Clubs or Organizations: No    Attends Banker Meetings: Never    Marital Status: Married  Catering manager Violence: Not At Risk (09/11/2022)   Humiliation, Afraid, Rape, and Kick questionnaire    Fear of Current or Ex-Partner: No    Emotionally Abused: No    Physically Abused: No    Sexually Abused: No     BP 126/66   Pulse (!) 42   Ht 5\' 9"  (1.753 m)   Wt 245 lb 12.8 oz (111.5 kg)   SpO2 97%   BMI 36.30 kg/m   Physical Exam:  Well appearing NAD HEENT: Unremarkable Neck:  No JVD, no thyromegally Lymphatics:  No adenopathy Back:  No CVA tenderness Lungs:  Clear HEART:  IRegular rate rhythm, no murmurs, no rubs, no clicks Abd:  soft, positive bowel sounds, no organomegally, no rebound, no guarding Ext:  2 plus pulses, no edema, no cyanosis, no clubbing Skin:  No rashes no nodules Neuro:  CN II through XII intact, motor grossly intact  EKG - reviewed   Assess/Plan: PVC's - I cannot get him to convince me that he is symptomatic. As his EF is still normal despite an MI, I would recommend watchful waiting. Mexitil and amio would be the only treatment options and both have side effects that preclude there use in this asymptomatic patient. If he were to develop symptoms or if his EF were to go down or if he developed a lot more PVC's maybe over 30% then I might consider treating more aggressively. CAD - he denies anginal symptoms. He walks his property most every day without limit. 3. Claudication - he describes fairly mild symptoms.    Sharlot Gowda Hank Walling,MD

## 2022-12-05 ENCOUNTER — Other Ambulatory Visit: Payer: Self-pay | Admitting: Pharmacist

## 2022-12-05 MED ORDER — REPATHA SURECLICK 140 MG/ML ~~LOC~~ SOAJ: 140.0000 mg | SUBCUTANEOUS | 3 refills | Status: DC

## 2022-12-16 ENCOUNTER — Ambulatory Visit: Payer: Medicare HMO | Admitting: Acute Care

## 2022-12-16 ENCOUNTER — Encounter: Payer: Medicare HMO | Admitting: Primary Care

## 2022-12-16 ENCOUNTER — Encounter: Payer: Self-pay | Admitting: Acute Care

## 2022-12-16 DIAGNOSIS — Z87891 Personal history of nicotine dependence: Secondary | ICD-10-CM

## 2022-12-16 NOTE — Progress Notes (Signed)
Virtual Visit via Telephone Note  I connected with [name] on 12/16/22 at  9:30 AM EDT by telephone and verified that I am speaking with the correct person using two identifiers.  Location: Patient:  At home Provider: [address], [place], [place], [address]    I discussed the limitations, risks, security and privacy concerns of performing an evaluation and management service by telephone and the availability of in person appointments. I also discussed with the patient that there may be a patient responsible charge related to this service. The patient expressed understanding and agreed to proceed.   Shared Decision Making Visit Lung Cancer Screening Program [phone])   Eligibility: Age 62 y.o. Pack Years Smoking History Calculation 68 pack years (# packs/per year x # years smoked) Recent History of coughing up blood  no Unexplained weight loss? no ( >Than 15 pounds within the last 6 months ) Prior History Lung / other cancer no (Diagnosis within the last 5 years already requiring surveillance chest CT Scans). Smoking Status Former Smoker Former Smokers: Years since quit: 13 years  Quit Date: 01/27/2019  Visit Components: Discussion included one or more decision making aids. yes Discussion included risk/benefits of screening. yes Discussion included potential follow up diagnostic testing for abnormal scans. yes Discussion included meaning and risk of over diagnosis. yes Discussion included meaning and risk of False Positives. yes Discussion included meaning of total radiation exposure. yes  Counseling Included: Importance of adherence to annual lung cancer LDCT screening. yes Impact of comorbidities on ability to participate in the program. yes Ability and willingness to under diagnostic treatment. yes  Smoking Cessation Counseling: Current Smokers:  Discussed importance of smoking cessation. yes Information about tobacco cessation classes and interventions  provided to patient. yes Patient provided with "ticket" for LDCT Scan. yes Symptomatic Patient. no  Counseling NA Diagnosis Code: Tobacco Use Z72.0 Asymptomatic Patient yes  Counseling (Intermediate counseling: > three minutes counseling) U0454 Former Smokers:  Discussed the importance of maintaining cigarette abstinence. yes Diagnosis Code: Personal History of Nicotine Dependence. U98.119 Information about tobacco cessation classes and interventions provided to patient. Yes Patient provided with "ticket" for LDCT Scan. yes Written Order for Lung Cancer Screening with LDCT placed in Epic. Yes (CT Chest Lung Cancer Screening Low Dose W/O CM) [id number] Z12.2-Screening of respiratory organs Z87.891-Personal history of nicotine dependence  I spent 25 minutes of face to face time/virtual visit time  with [name] discussing the risks and benefits of lung cancer screening. We took the time to pause the power point at intervals to allow for questions to be asked and answered to ensure understanding. We discussed that he had taken the single most powerful action possible to decrease his risk of developing lung cancer when he quit smoking. I counseled him to remain smoke free, and to contact me if he ever had the desire to smoke again so that I can provide resources and tools to help support the effort to remain smoke free. We discussed the time and location of the scan, and that either  Abigail Miyamoto RN, Karlton Lemon, RN or I  or I will call / send a letter with the results within  24-72 hours of receiving them. He has the office contact information in the event he needs to speak with me,  he verbalized understanding of all of the above and had no further questions upon leaving the office.     I explained to the patient that there has been a high  incidence of coronary artery disease noted on these exams. I explained that this is a non-gated exam therefore degree or severity cannot be determined. This  patient is on statin therapy. I have asked the patient to follow-up with their PCP regarding any incidental finding of coronary artery disease and management with diet or medication as they feel is clinically indicated. The patient verbalized understanding of the above and had no further questions.     Bevelyn Ngo, NP 12/16/2022

## 2022-12-16 NOTE — Patient Instructions (Signed)

## 2022-12-18 ENCOUNTER — Ambulatory Visit
Admission: RE | Admit: 2022-12-18 | Discharge: 2022-12-18 | Disposition: A | Discharge status: Home / Self Care | Payer: Medicare HMO | Source: Ambulatory Visit | Attending: Acute Care | Admitting: Acute Care

## 2022-12-18 ENCOUNTER — Encounter: Payer: Medicare HMO | Admitting: Pharmacist

## 2022-12-18 DIAGNOSIS — Z87891 Personal history of nicotine dependence: Secondary | ICD-10-CM | POA: Diagnosis not present

## 2022-12-18 DIAGNOSIS — Z122 Encounter for screening for malignant neoplasm of respiratory organs: Secondary | ICD-10-CM

## 2022-12-25 ENCOUNTER — Telehealth: Payer: Self-pay | Admitting: Acute Care

## 2022-12-25 NOTE — Telephone Encounter (Signed)
CALL REPORT  

## 2022-12-26 NOTE — Telephone Encounter (Signed)
IMPRESSION: 1. Lung-RADS 2, benign appearance or behavior. Continue annual screening with low-dose chest CT without contrast in 12 months. 2. 17 mm low-density retroperitoneal nodule is probably arising from the right adrenal gland and low-density of this nodule suggests benign adrenal adenoma. As a retroperitoneal nodule adjacent to the right adrenal gland cannot be entirely excluded, follow-up abdomen CT without contrast in 3-6 months recommended to ensure stability. 3. Aortic Atherosclerosis (ICD10-I70.0) and Emphysema (ICD10-J43.9).

## 2022-12-30 NOTE — Progress Notes (Signed)
Cardiology Office Note:    Date:  12/31/2022  ID:  [name], DOB 09-Feb-1961, MRN [id number] PCP: [name], MD  [facility] Providers Cardiologist:  Kristeen Miss, MD Electrophysiologist:  Lewayne Bunting, MD       Patient Profile:      Coronary artery disease  s/p CABG in 2010 s/p Inf MI in 04/2009 >>PCI:  DES to RCA LHC 06/02/16: LIMA-LAD, SVG-RI, SVG-OM 3 patent; SVG-RCA occluded; proximal RCA stent patent, ostial RPDA 50 (HFpEF) heart failure with preserved ejection fraction  TTE 06/20/2019: EF 55-60, no RWMA, mod LVH, Gr 1 DD, normal RVSF, mod LAE  TTE 09/12/2022: EF 55-60, no RWMA, mild LVH, mildly reduced RVSF, normal PASP, severe LAE, moderate RAE, moderate MAC, mild AV calcification, ascending aorta 39 mm, RAP 3 PVCs Monitor 09/2022: 19.1% burden PAD  s/p Iliac stenting ABIs 01/2019: normal; no focal stenosis on [place] ABIs 09/11/2022: ABIs nondiagnostic (>1.4-incompressible); somewhat dampened arterial waveforms noted on right dorsalis pedis artery Hypertension  Hyperlipidemia  OSA  Chronic Obstructive Pulmonary Disease         History of Present Illness:  Discussed the use of AI scribe software for clinical note transcription with the patient, who gave verbal consent to proceed.    A 62 year old patient with a history of congestive heart failure, PVCs, and coronary artery disease presents for a follow-up visit. The patient was last seen in June 2024, at which time London Pepper was added to his medical regimen for heart failure. However, the patient reported that the medication was too expensive and he has not been taking it. The patient also saw Dr. Ladona Ridgel with EP for PVCs, and it was recommended that he be observed unless he develops symptoms or a worsening ejection fraction. His wife recently had a stroke. She was sent home on Hospice. He is not having chest pain, syncope. He notes dyspnea on exertion that is unchanged.     ROS:  See HPI    Studies Reviewed:        Risk Assessment/Calculations:             Physical Exam:   VS:  BP 118/80   Pulse (!) 54   Ht 5\' 9"  (1.753 m)   Wt 245 lb 9.6 oz (111.4 kg)   SpO2 98%   BMI 36.27 kg/m    Wt Readings from Last 3 Encounters:  12/31/22 245 lb 9.6 oz (111.4 kg)  11/26/22 245 lb 12.8 oz (111.5 kg)  10/24/22 249 lb 9.6 oz (113.2 kg)    Constitutional:      Appearance: Healthy appearance. Not in distress.  Neck:     Vascular: No JVR. JVD normal.  Pulmonary:     Breath sounds: Normal breath sounds. No wheezing. No rales.  Cardiovascular:     Normal rate. Irregular rhythm.     Murmurs: There is no murmur.  Edema:    Peripheral edema absent.  Abdominal:     Palpations: Abdomen is soft.        Assessment and Plan:     Heart Failure with Preserved Ejection Fraction (HFpEF) NYHA Class II, volume status stable. London Pepper was not initiated due to cost. -Provide patient with assistance information for Jardiance. If coverage is obtained, resume Jardiance 10mg  daily. -Continue Lasix 20mg  daily.  Premature Ventricular Contractions (PVCs) Asymptomatic with a normal ejection fraction. PVC burden of 19% on previous monitoring. -Continue observation. Follow up with electrophysiology as needed. Management will be based on symptom development or changes  in ejection fraction.  Coronary Artery Disease (CAD) Stable, no chest pain suggestive of angina. History of bypass in 2010 and inferior MI in 2011 treated with DES to the RCA. -Continue Plavix 75mg  daily, and Lipitor 80mg  daily.  Hypertension Controlled. -Continue Amlodipine 10mg  daily and Losartan 100mg  daily.  Hyperlipidemia LDL optimal. -Continue Lipitor 80mg  daily and Repatha 140mg  every 2 weeks.        Dispo:  Return in about 6 months (around 06/30/2023) for Routine Follow Up, w/ Dr. Elease Hashimoto.  Signed, Tereso Newcomer, PA-C

## 2022-12-31 ENCOUNTER — Ambulatory Visit: Payer: Medicare HMO | Attending: Physician Assistant | Admitting: Physician Assistant

## 2022-12-31 ENCOUNTER — Encounter: Payer: Self-pay | Admitting: Physician Assistant

## 2022-12-31 VITALS — BP 118/80 | HR 54 | Ht 69.0 in | Wt 245.6 lb

## 2022-12-31 DIAGNOSIS — I10 Essential (primary) hypertension: Secondary | ICD-10-CM | POA: Diagnosis not present

## 2022-12-31 DIAGNOSIS — I5032 Chronic diastolic (congestive) heart failure: Secondary | ICD-10-CM | POA: Diagnosis not present

## 2022-12-31 DIAGNOSIS — I493 Ventricular premature depolarization: Secondary | ICD-10-CM | POA: Diagnosis not present

## 2022-12-31 DIAGNOSIS — E782 Mixed hyperlipidemia: Secondary | ICD-10-CM | POA: Diagnosis not present

## 2022-12-31 DIAGNOSIS — I2510 Atherosclerotic heart disease of native coronary artery without angina pectoris: Secondary | ICD-10-CM | POA: Diagnosis not present

## 2022-12-31 DIAGNOSIS — I739 Peripheral vascular disease, unspecified: Secondary | ICD-10-CM

## 2022-12-31 NOTE — Patient Instructions (Signed)
Medication Instructions:  Your physician recommends that you continue on your current medications as directed. Please refer to the Current Medication list given to you today. *If you need a refill on your cardiac medications before your next appointment, please call your pharmacy*   Lab Work: None ordered If you have labs (blood work) drawn today and your tests are completely normal, you will receive your results only by: MyChart Message (if you have MyChart) OR A paper copy in the mail If you have any lab test that is abnormal or we need to change your treatment, we will call you to review the results.   Testing/Procedures: None ordered   Follow-Up: At [facility], you and your health needs are our priority.  As part of our continuing mission to provide you with exceptional heart care, we have created designated Provider Care Teams.  These Care Teams include your primary Cardiologist (physician) and Advanced Practice Providers (APPs -  Physician Assistants and Nurse Practitioners) who all work together to provide you with the care you need, when you need it.  We recommend signing up for the patient portal called "MyChart".  Sign up information is provided on this After Visit Summary.  MyChart is used to connect with patients for Virtual Visits (Telemedicine).  Patients are able to view lab/test results, encounter notes, upcoming appointments, etc.  Non-urgent messages can be sent to your provider as well.   To learn more about what you can do with MyChart, go to [email or web address].    Your next appointment:   6 month(s)  Provider:   Philip Nahser, MD    Other Instructions   

## 2023-01-13 ENCOUNTER — Other Ambulatory Visit: Payer: Self-pay | Admitting: Family Medicine

## 2023-01-15 NOTE — Telephone Encounter (Signed)
Last OV 09/24/22 Requested Prescriptions  Pending Prescriptions Disp Refills   atorvastatin (LIPITOR) 80 MG tablet [Pharmacy Med Name: ATORVASTATIN 80MG  TABLETS] 90 tablet     Sig: TAKE 1 TABLET BY MOUTH EVERY NIGHT AT BEDTIME     Cardiovascular:  Antilipid - Statins Failed - 01/13/2023  5:40 PM      Failed - Valid encounter within last 12 months    Recent Outpatient Visits           2 years ago Rhinosinusitis   [facility] [name], MD   3 years ago ASCVD (arteriosclerotic cardiovascular disease)   [facility] [name], MD   4 years ago Coronary artery disease due to lipid rich plaque   [facility] Pickard, Priscille Heidelberg, MD   6 years ago Hyperglycemia   [facility] Tanya Nones, Priscille Heidelberg, MD   6 years ago Viral illness   [facility] Whitlock, Velna Hatchet, MD              Failed - Lipid Panel in normal range within the last 12 months    Cholesterol, Total  Date Value Ref Range Status  05/10/2021 110 100 - 199 mg/dL Final   Cholesterol  Date Value Ref Range Status  05/19/2022 76 <200 mg/dL Final   LDL Cholesterol (Calc)  Date Value Ref Range Status  05/19/2022 20 mg/dL (calc) Final    Comment:    Reference range: <100 . Desirable range <100 mg/dL for primary prevention;   <70 mg/dL for patients with CHD or diabetic patients  with > or = 2 CHD risk factors. Marland Kitchen LDL-C is now calculated using the Martin-Hopkins  calculation, which is a validated novel method providing  better accuracy than the Friedewald equation in the  estimation of LDL-C.  Horald Pollen et al. Lenox Ahr. 4098;119(14): 2061-2068  (http://education.[employer].com/faq/FAQ164)    LDL Direct  Date Value Ref Range Status  02/23/2017 117 (H) 0 - 99 mg/dL Final   HDL  Date Value Ref Range Status  05/19/2022 40 > OR = 40 mg/dL Final  78/29/5621 43 >30 mg/dL Final   Triglycerides  Date Value Ref Range Status   05/19/2022 81 <150 mg/dL Final         Passed - Patient is not pregnant

## 2023-01-30 ENCOUNTER — Other Ambulatory Visit: Payer: Self-pay | Admitting: Cardiovascular Disease

## 2023-02-11 ENCOUNTER — Ambulatory Visit: Payer: Medicare HMO

## 2023-02-12 ENCOUNTER — Ambulatory Visit: Payer: Medicare HMO | Admitting: Family Medicine

## 2023-02-12 VITALS — BP 126/76 | HR 56 | Temp 97.8°F | Ht 69.0 in | Wt 248.0 lb

## 2023-02-12 DIAGNOSIS — J019 Acute sinusitis, unspecified: Secondary | ICD-10-CM

## 2023-02-12 MED ORDER — AMOXICILLIN 875 MG PO TABS: 875.0000 mg | ORAL_TABLET | Freq: Two times a day (BID) | ORAL | 0 refills | Status: AC

## 2023-02-12 NOTE — Progress Notes (Signed)
Subjective:  Patient reports a 2-week history of head congestion, he reports constant postnasal drip causing him to cough.  He denies a name.  He denies any shortness of breath.  He denies any purulent sputum.  He does complain of pain and tenderness in his maxillary sinuses bilaterally along with postnasal drip.  He denies any fevers or chills. Past Medical History:  Diagnosis Date   (HFpEF) heart failure with preserved ejection fraction (HCC) 10/23/2022   TTE 09/12/2022: EF 55-60, no RWMA, mild LVH, mildly reduced RVSF, normal PASP, severe LAE, moderate RAE, moderate MAC, mild AV calcification, ascending aorta 39 mm, RAP 3   Acute MI, 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   Chest pain    COPD (chronic obstructive pulmonary disease) (HCC)    Coronary artery disease    STATUS POST CABG 01/2009   Dyslipidemia    Hyperlipidemia    Hypertension    Orthostatic hypotension    Peripheral vascular disease (HCC)    PVC's (premature ventricular contractions) 10/01/2022   Zio 09/2022: 19.1% PVCs, Avg HR 63  TTE 08/2022: EF 55-60   Syncope and collapse    Past Surgical History:  Procedure Laterality Date   ANKLE SURGERY     x 2   COLONOSCOPY  April 2013   CORONARY ANGIOPLASTY     CORONARY ARTERY BYPASS GRAFT  2010   FOOT SURGERY     HERNIA REPAIR  01/23/12   umb hernia   Kidney stimulator     LEFT HEART CATH AND CORS/GRAFTS ANGIOGRAPHY N/A 06/02/2016   Procedure: Left Heart Cath and Cors/Grafts Angiography;  Surgeon: Runell Gess, MD;  Location: [facility]ASIVE [facility];  Service: Cardiovascular;  Laterality: N/A;   LEFT HEART CATHETERIZATION WITH CORONARY/GRAFT ANGIOGRAM N/A 07/08/2012   Procedure: LEFT HEART CATHETERIZATION WITH Isabel Caprice;  Surgeon: Vesta Mixer, MD;  Location: [facility] CATH LAB;  Service: Cardiovascular;  Laterality: N/A;   PR VEIN BYPASS GRAFT,AORTO-FEM-POP     STERNAL WIRES REMOVAL  07/08/2011   Procedure: STERNAL WIRES REMOVAL;  Surgeon: [name], MD;  Location:  [facility];  Service: Open Heart Surgery;  Laterality: N/A;  removal of 4th wire   Current Outpatient Medications on File Prior to Visit  Medication Sig Dispense Refill   amLODipine (NORVASC) 10 MG tablet TAKE 1 TABLET(10 MG) BY MOUTH DAILY 90 tablet 1   atorvastatin (LIPITOR) 80 MG tablet TAKE 1 TABLET BY MOUTH EVERY NIGHT AT BEDTIME 90 tablet 1   clopidogrel (PLAVIX) 75 MG tablet TAKE 1 TABLET(75 MG) BY MOUTH DAILY 90 tablet 2   Evolocumab (REPATHA SURECLICK) 140 MG/ML SOAJ Inject 140 mg into the skin every 14 (fourteen) days. 6 mL 3   gabapentin (NEURONTIN) 800 MG tablet Take 800 mg by mouth 3 (three) times daily.     losartan (COZAAR) 100 MG tablet TAKE 1 TABLET(100 MG) BY MOUTH DAILY 90 tablet 3   naloxone (NARCAN) nasal spray 4 mg/0.1 mL Place 1 spray into the nose once.     oxyCODONE-acetaminophen (PERCOCET) 10-325 MG tablet Take 1 tablet by mouth every 6 (six) hours as needed.     sodium bicarbonate 650 MG tablet Take 1 tablet (650 mg total) by mouth daily. 30 tablet 1   furosemide (LASIX) 20 MG tablet Take 1 tablet (20 mg total) by mouth every other day. 90 tablet 3   No current facility-administered medications on file prior to visit.     Allergies  Allergen Reactions   Rosuvastatin  Muscle aches. Other reaction(s): leg pain, Other Muscle aches. Other reaction(s): leg pain Other reaction(s): leg pain    Social History   Socioeconomic History   Marital status: Married    Spouse name: Irving Burton   Number of children: 2   Years of education: Not on file   Highest education level: Not on file  Occupational History   Occupation: Disability  Tobacco Use   Smoking status: Former    Current packs/day: 0.00    Average packs/day: 3.0 packs/day for 30.0 years (90.0 ttl pk-yrs)    Types: Cigarettes, Cigars    Start date: 01/27/1979    Quit date: 01/26/2009    Years since quitting: 14.0    Passive exposure: Past   Smokeless tobacco: Never  Vaping Use   Vaping status: Never Used   Substance and Sexual Activity   Alcohol use: No   Drug use: No   Sexual activity: Not Currently  Other Topics Concern   Not on file  Social History Narrative   Married x 37 years. Irving Burton)    Social Determinants of Health   Financial Resource Strain: Low Risk  (09/16/2022)   Overall Financial Resource Strain (CARDIA)    Difficulty of Paying Living Expenses: Not hard at all  Food Insecurity: No Food Insecurity (09/16/2022)   Hunger Vital Sign    Worried About Running Out of Food in the Last Year: Never true    Ran Out of Food in the Last Year: Never true  Transportation Needs: No Transportation Needs (09/16/2022)   PRAPARE - Administrator, Civil Service (Medical): No    Lack of Transportation (Non-Medical): No  Physical Activity: Sufficiently Active (05/08/2022)   Exercise Vital Sign    Days of Exercise per Week: 5 days    Minutes of Exercise per Session: 30 min  Stress: No Stress Concern Present (05/08/2022)   [employer] of Occupational Health - Occupational Stress Questionnaire    Feeling of Stress : Not at all  Social Connections: Moderately Isolated (05/08/2022)   Social Connection and Isolation Panel [NHANES]    Frequency of Communication with Friends and Family: More than three times a week    Frequency of Social Gatherings with Friends and Family: Three times a week    Attends Religious Services: Never    Active Member of Clubs or Organizations: No    Attends Banker Meetings: Never    Marital Status: Married  Catering manager Violence: Not At Risk (09/11/2022)   Humiliation, Afraid, Rape, and Kick questionnaire    Fear of Current or Ex-Partner: No    Emotionally Abused: No    Physically Abused: No    Sexually Abused: No   Family History  Problem Relation Age of Onset   Coronary artery disease Father 19   Diabetes Father    Hyperlipidemia Father    Heart disease Father        before age 23   Hypertension Father    Cancer Mother         uterine   Hyperlipidemia Mother    Diabetes Mother    Hypertension Mother    Hypertension Brother      Review of Systems  All other systems reviewed and are negative.      Objective:   Physical Exam Vitals reviewed.  Constitutional:      General: He is not in acute distress.    Appearance: He is well-developed. He is not diaphoretic.  HENT:  Head: Normocephalic and atraumatic.     Right Ear: Tympanic membrane, ear canal and external ear normal.     Left Ear: Tympanic membrane, ear canal and external ear normal.     Nose: Congestion and rhinorrhea present.     Right Sinus: Maxillary sinus tenderness present.     Left Sinus: Maxillary sinus tenderness present.     Mouth/Throat:     Pharynx: No oropharyngeal exudate.  Neck:     Thyroid: No thyromegaly.     Vascular: No JVD.     Trachea: No tracheal deviation.  Cardiovascular:     Rate and Rhythm: Normal rate and regular rhythm.     Heart sounds: Normal heart sounds. No murmur heard.    No friction rub. No gallop.  Pulmonary:     Effort: Pulmonary effort is normal. No respiratory distress.     Breath sounds: No stridor. Wheezing and rhonchi present. No rales.  Chest:     Chest wall: No tenderness.  Musculoskeletal:        General: No tenderness or deformity. Normal range of motion.     Cervical back: Normal range of motion and neck supple.  Lymphadenopathy:     Cervical: No cervical adenopathy.  Skin:    General: Skin is warm.     Coloration: Skin is not pale.     Findings: No erythema or rash.  Neurological:     Mental Status: He is alert and oriented to person, place, and time.     Cranial Nerves: No cranial nerve deficit.     Motor: No abnormal muscle tone.     Coordination: Coordination normal.     Deep Tendon Reflexes: Reflexes are normal and symmetric.  Psychiatric:        Behavior: Behavior normal.        Thought Content: Thought content normal.        Judgment: Judgment normal.            Assessment & Plan:  Acute rhinosinusitis Symptoms are persistent now for more than 14 days.  The patient has pain and pressure in his maxillary sinuses bilaterally.  I believe that he has developed a secondary sinus infection.  Begin amoxicillin 875 mg twice daily for 1 week.  Supplement with Mucinex.

## 2023-02-13 ENCOUNTER — Other Ambulatory Visit: Payer: Self-pay

## 2023-02-13 ENCOUNTER — Telehealth (HOSPITAL_BASED_OUTPATIENT_CLINIC_OR_DEPARTMENT_OTHER): Payer: Self-pay | Admitting: Acute Care

## 2023-02-13 ENCOUNTER — Telehealth: Payer: Self-pay

## 2023-02-13 DIAGNOSIS — Z87891 Personal history of nicotine dependence: Secondary | ICD-10-CM

## 2023-02-13 DIAGNOSIS — Z122 Encounter for screening for malignant neoplasm of respiratory organs: Secondary | ICD-10-CM

## 2023-02-13 NOTE — Telephone Encounter (Signed)
I have called the patient with the results of his low dose Ct chest. His scan was read as a lr 2, 12 month follow up. This will be due in 11/2023. He is in agreement with this plan.  I also explained that there was also notation of a 17 mm low-density retroperitoneal nodule which  is probably arising from the right adrenal gland and low-density of this nodule suggests benign adrenal adenoma. As a retroperitoneal nodule adjacent to the right adrenal gland cannot be entirely excluded, follow-up abdomen CT without contrast in 3-6 months recommended to ensure stability.  I told the patient I would notify  Dr. Caren Macadam office to make sure he was aware of this finding, so he can order the follow up abdominal Ct without contrast in 3-6 months if he feels it is clinically indicated. There is a previous Renal US that was done in 2023 that did not show any abnormality of the right kidney.  I have spoken with Dr. Caren Macadam RN, and she will let Dr. Tanya Nones know. I will also sent them a copy of the CT Chest.   D,D, E, 12 month follow up LDCT 11/2022. I have faxed the results to Dr. Tanya Nones.  Thanks so much

## 2023-02-13 NOTE — Telephone Encounter (Signed)
2025 annual LDCT order placed

## 2023-02-13 NOTE — Telephone Encounter (Signed)
Message from Kandice Robinsons, NP: [name] called the patient with the results of his low dose Ct chest. His scan was read as a lr 2, 12 month follow up. This will be due in 11/2023. He is in agreement with this plan.   There was also notation of a 17 mm low-density retroperitoneal nodule is probably arising from the right adrenal gland and low-density of this nodule suggests benign adrenal adenoma. As a retroperitoneal nodule adjacent to the right adrenal gland cannot be entirely excluded, follow-up abdomen CT without contrast in 3-6 months recommended to ensure stability.   I told the patient I would notify  Dr. Caren Macadam office to make sure he was aware of this finding, so he can order the follow up abdominal Ct without contrast in 3-6 months if he feels it is clinically indicated. There is a previous Renal US that was done in 2023 that did not show any abnormality

## 2023-02-16 ENCOUNTER — Telehealth: Payer: Self-pay

## 2023-02-16 ENCOUNTER — Other Ambulatory Visit: Payer: Self-pay | Admitting: Family Medicine

## 2023-02-16 DIAGNOSIS — G894 Chronic pain syndrome: Secondary | ICD-10-CM | POA: Diagnosis not present

## 2023-02-16 DIAGNOSIS — D3501 Benign neoplasm of right adrenal gland: Secondary | ICD-10-CM

## 2023-02-16 DIAGNOSIS — M25572 Pain in left ankle and joints of left foot: Secondary | ICD-10-CM | POA: Diagnosis not present

## 2023-02-16 DIAGNOSIS — M79672 Pain in left foot: Secondary | ICD-10-CM | POA: Diagnosis not present

## 2023-02-16 NOTE — Telephone Encounter (Signed)
[name], MD  Darral Dash, LPN; Bevelyn Ngo, NP MJ, let patient know I scheduled a CT of his abdomen to watch a spot on his adrenal gland in 6 months.  Most likely benign.  10/21 @ 8:45 am. Pt advised and verbalized understanding of all. Mjp,lpn

## 2023-04-08 ENCOUNTER — Other Ambulatory Visit: Payer: Self-pay | Admitting: Cardiovascular Disease

## 2023-04-13 DIAGNOSIS — G894 Chronic pain syndrome: Secondary | ICD-10-CM | POA: Diagnosis not present

## 2023-04-13 DIAGNOSIS — M25572 Pain in left ankle and joints of left foot: Secondary | ICD-10-CM | POA: Diagnosis not present

## 2023-04-13 DIAGNOSIS — M79672 Pain in left foot: Secondary | ICD-10-CM | POA: Diagnosis not present

## 2023-05-14 ENCOUNTER — Ambulatory Visit (INDEPENDENT_AMBULATORY_CARE_PROVIDER_SITE_OTHER): Payer: Medicare Other | Admitting: *Deleted

## 2023-05-14 DIAGNOSIS — Z0000 Encounter for general adult medical examination without abnormal findings: Secondary | ICD-10-CM | POA: Diagnosis not present

## 2023-05-14 NOTE — Patient Instructions (Signed)
[name] , Thank you for taking time to come for your Medicare Wellness Visit. I appreciate your ongoing commitment to your health goals. Please review the following plan we discussed and let me know if I can assist you in the future.   Screening recommendations/referrals: Colonoscopy: up to date Recommended yearly ophthalmology/optometry visit for glaucoma screening and checkup Recommended yearly dental visit for hygiene and checkup  Vaccinations: Influenza vaccine:  Pneumococcal vaccine: up to date Tdap vaccine: up to date Shingles vaccine: Education provided    Advanced directives: Mailed paperwork    Preventive Care 40-64 Years, Male Preventive care refers to lifestyle choices and visits with your health care provider that can promote health and wellness. What does preventive care include? A yearly physical exam. This is also called an annual well check. Dental exams once or twice a year. Routine eye exams. Ask your health care provider how often you should have your eyes checked. Personal lifestyle choices, including: Daily care of your teeth and gums. Regular physical activity. Eating a healthy diet. Avoiding tobacco and drug use. Limiting alcohol use. Practicing safe sex. Taking low-dose aspirin every day starting at age 77. What happens during an annual well check? The services and screenings done by your health care provider during your annual well check will depend on your age, overall health, lifestyle risk factors, and family history of disease. Counseling  Your health care provider may ask you questions about your: Alcohol use. Tobacco use. Drug use. Emotional well-being. Home and relationship well-being. Sexual activity. Eating habits. Work and work Astronomer. Screening  You may have the following tests or measurements: Height, weight, and BMI. Blood pressure. Lipid and cholesterol levels. These may be checked every 5 years, or more frequently if you  are over 6 years old. Skin check. Lung cancer screening. You may have this screening every year starting at age 22 if you have a 30-pack-year history of smoking and currently smoke or have quit within the past 15 years. Fecal occult blood test (FOBT) of the stool. You may have this test every year starting at age 68. Flexible sigmoidoscopy or colonoscopy. You may have a sigmoidoscopy every 5 years or a colonoscopy every 10 years starting at age 68. Prostate cancer screening. Recommendations will vary depending on your family history and other risks. Hepatitis C blood test. Hepatitis B blood test. Sexually transmitted disease (STD) testing. Diabetes screening. This is done by checking your blood sugar (glucose) after you have not eaten for a while (fasting). You may have this done every 1-3 years. Discuss your test results, treatment options, and if necessary, the need for more tests with your health care provider. Vaccines  Your health care provider may recommend certain vaccines, such as: Influenza vaccine. This is recommended every year. Tetanus, diphtheria, and acellular pertussis (Tdap, Td) vaccine. You may need a Td booster every 10 years. Zoster vaccine. You may need this after age 70. Pneumococcal 13-valent conjugate (PCV13) vaccine. You may need this if you have certain conditions and have not been vaccinated. Pneumococcal polysaccharide (PPSV23) vaccine. You may need one or two doses if you smoke cigarettes or if you have certain conditions. Talk to your health care provider about which screenings and vaccines you need and how often you need them. This information is not intended to replace advice given to you by your health care provider. Make sure you discuss any questions you have with your health care provider. Document Released: 05/11/2015 Document Revised: 01/02/2016 Document Reviewed: 02/13/2015 Elsevier Interactive  Patient Education  2017 [employer].  Fall Prevention in  the [facility] can cause injuries. They can happen to people of all ages. There are many things you can do to make your home safe and to help prevent falls. What can I do on the outside of my home? Regularly fix the edges of walkways and driveways and fix any cracks. Remove anything that might make you trip as you walk through a door, such as a raised step or threshold. Trim any bushes or trees on the path to your home. Use bright outdoor lighting. Clear any walking paths of anything that might make someone trip, such as rocks or tools. Regularly check to see if handrails are loose or broken. Make sure that both sides of any steps have handrails. Any raised decks and porches should have guardrails on the edges. Have any leaves, snow, or ice cleared regularly. Use sand or salt on walking paths during winter. Clean up any spills in your garage right away. This includes oil or grease spills. What can I do in the bathroom? Use night lights. Install grab bars by the toilet and in the tub and shower. Do not use towel bars as grab bars. Use non-skid mats or decals in the tub or shower. If you need to sit down in the shower, use a plastic, non-slip stool. Keep the floor dry. Clean up any water that spills on the floor as soon as it happens. Remove soap buildup in the tub or shower regularly. Attach bath mats securely with double-sided non-slip rug tape. Do not have throw rugs and other things on the floor that can make you trip. What can I do in the bedroom? Use night lights. Make sure that you have a light by your bed that is easy to reach. Do not use any sheets or blankets that are too big for your bed. They should not hang down onto the floor. Have a firm chair that has side arms. You can use this for support while you get dressed. Do not have throw rugs and other things on the floor that can make you trip. What can I do in the kitchen? Clean up any spills right away. Avoid walking on wet  floors. Keep items that you use a lot in easy-to-reach places. If you need to reach something above you, use a strong step stool that has a grab bar. Keep electrical cords out of the way. Do not use floor polish or wax that makes floors slippery. If you must use wax, use non-skid floor wax. Do not have throw rugs and other things on the floor that can make you trip. What can I do with my stairs? Do not leave any items on the stairs. Make sure that there are handrails on both sides of the stairs and use them. Fix handrails that are broken or loose. Make sure that handrails are as long as the stairways. Check any carpeting to make sure that it is firmly attached to the stairs. Fix any carpet that is loose or worn. Avoid having throw rugs at the top or bottom of the stairs. If you do have throw rugs, attach them to the floor with carpet tape. Make sure that you have a light switch at the top of the stairs and the bottom of the stairs. If you do not have them, ask someone to add them for you. What else can I do to help prevent falls? Wear shoes that: Do not have high heels.  Have rubber bottoms. Are comfortable and fit you well. Are closed at the toe. Do not wear sandals. If you use a stepladder: Make sure that it is fully opened. Do not climb a closed stepladder. Make sure that both sides of the stepladder are locked into place. Ask someone to hold it for you, if possible. Clearly mark and make sure that you can see: Any grab bars or handrails. First and last steps. Where the edge of each step is. Use tools that help you move around (mobility aids) if they are needed. These include: Canes. Walkers. Scooters. Crutches. Turn on the lights when you go into a dark area. Replace any light bulbs as soon as they burn out. Set up your furniture so you have a clear path. Avoid moving your furniture around. If any of your floors are uneven, fix them. If there are any pets around you, be aware of  where they are. Review your medicines with your doctor. Some medicines can make you feel dizzy. This can increase your chance of falling. Ask your doctor what other things that you can do to help prevent falls. This information is not intended to replace advice given to you by your health care provider. Make sure you discuss any questions you have with your health care provider. Document Released: 02/08/2009 Document Revised: 09/20/2015 Document Reviewed: 05/19/2014 Elsevier Interactive Patient Education  2017 [employer].

## 2023-05-14 NOTE — Progress Notes (Signed)
Subjective:   [name] is a 63 y.o. male who presents for Medicare Annual/Subsequent preventive examination.  Visit Complete: Virtual I connected with  [name] on 05/14/23 by a audio enabled telemedicine application and verified that I am speaking with the correct person using two identifiers.  Patient unable to connect to Video  Patient Location: Home  Provider Location: Home Office  I discussed the limitations of evaluation and management by telemedicine. The patient expressed understanding and agreed to proceed.  Vital Signs: Because this visit was a virtual/telehealth visit, some criteria may be missing or patient reported. Any vitals not documented were not able to be obtained and vitals that have been documented are patient reported.  Cardiac Risk Factors include: advanced age (>68men, >26 women);male gender;family history of premature cardiovascular disease     Objective:    Today's Vitals   05/14/23 0826  PainSc: 8    There is no height or weight on file to calculate BMI.     05/14/2023    8:34 AM 09/11/2022    9:00 PM 09/11/2022    3:01 PM 05/08/2022    9:06 AM 07/30/2021    7:53 AM 05/30/2021    9:28 AM 05/02/2021    9:13 AM  Advanced Directives  Does Patient Have a Medical Advance Directive? No  No No No No No  Would patient like information on creating a medical advance directive? No - Patient declined No - Patient declined  No - Patient declined No - Patient declined No - Patient declined No - Patient declined    Current Medications (verified) Outpatient Encounter Medications as of 05/14/2023  Medication Sig   amLODipine (NORVASC) 10 MG tablet TAKE 1 TABLET(10 MG) BY MOUTH DAILY   atorvastatin (LIPITOR) 80 MG tablet TAKE 1 TABLET BY MOUTH EVERY NIGHT AT BEDTIME   clopidogrel (PLAVIX) 75 MG tablet TAKE 1 TABLET(75 MG) BY MOUTH DAILY   Evolocumab (REPATHA SURECLICK) 140 MG/ML SOAJ Inject 140 mg into the skin every 14 (fourteen) days.   gabapentin  (NEURONTIN) 800 MG tablet Take 800 mg by mouth 3 (three) times daily.   losartan (COZAAR) 100 MG tablet TAKE 1 TABLET(100 MG) BY MOUTH DAILY   naloxone (NARCAN) nasal spray 4 mg/0.1 mL Place 1 spray into the nose once.   oxyCODONE-acetaminophen (PERCOCET) 10-325 MG tablet Take 1 tablet by mouth every 6 (six) hours as needed.   sodium bicarbonate 650 MG tablet Take 1 tablet (650 mg total) by mouth daily.   furosemide (LASIX) 20 MG tablet Take 1 tablet (20 mg total) by mouth every other day.   No facility-administered encounter medications on file as of 05/14/2023.    Allergies (verified) Rosuvastatin   History: Past Medical History:  Diagnosis Date   (HFpEF) heart failure with preserved ejection fraction (HCC) 10/23/2022   TTE 09/12/2022: EF 55-60, no RWMA, mild LVH, mildly reduced RVSF, normal PASP, severe LAE, moderate RAE, moderate MAC, mild AV calcification, ascending aorta 39 mm, RAP 3   Acute MI, 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   Chest pain    COPD (chronic obstructive pulmonary disease) (HCC)    Coronary artery disease    STATUS POST CABG 01/2009   Dyslipidemia    Hyperlipidemia    Hypertension    Orthostatic hypotension    Peripheral vascular disease (HCC)    PVC's (premature ventricular contractions) 10/01/2022   Zio 09/2022: 19.1% PVCs, Avg HR 63  TTE 08/2022: EF 55-60   Syncope and collapse  Past Surgical History:  Procedure Laterality Date   ANKLE SURGERY     x 2   COLONOSCOPY  April 2013   CORONARY ANGIOPLASTY     CORONARY ARTERY BYPASS GRAFT  2010   FOOT SURGERY     HERNIA REPAIR  01/23/12   umb hernia   Kidney stimulator     LEFT HEART CATH AND CORS/GRAFTS ANGIOGRAPHY N/A 06/02/2016   Procedure: Left Heart Cath and Cors/Grafts Angiography;  Surgeon: Runell Gess, MD;  Location: [facility] INVASIVE CV LAB;  Service: Cardiovascular;  Laterality: N/A;   LEFT HEART CATHETERIZATION WITH CORONARY/GRAFT ANGIOGRAM N/A 07/08/2012   Procedure: LEFT HEART CATHETERIZATION WITH  Isabel Caprice;  Surgeon: Vesta Mixer, MD;  Location[facility] C[facility] LAB;  Service: Cardiovascular;  Laterality: N/A;   PR VEIN BYPASS GRAFT,AORTO-FEM-POP     STERNAL WIRES REMOVAL  07/08/2011   Procedure: STERNAL WIRES REMOVAL;  Surgeon: [name], MD;  Location: [facility] OR;  Service: Open Heart Surgery;  Laterality: N/A;  removal of 4th wire   Family History  Problem Relation Age of Onset   Coronary artery disease Father 2   Diabetes Father    Hyperlipidemia Father    Heart disease Father        before age 9   Hypertension Father    Cancer Mother        uterine   Hyperlipidemia Mother    Diabetes Mother    Hypertension Mother    Hypertension Brother    Social History   Socioeconomic History   Marital status: Widowed    Spouse name: Irving Burton   Number of children: 2   Years of education: Not on file   Highest education level: Not on file  Occupational History   Occupation: Disability  Tobacco Use   Smoking status: Former    Current packs/day: 0.00    Average packs/day: 3.0 packs/day for 30.0 years (90.0 ttl pk-yrs)    Types: Cigarettes, Cigars    Start date: 01/27/1979    Quit date: 01/26/2009    Years since quitting: 14.3    Passive exposure: Past   Smokeless tobacco: Never  Vaping Use   Vaping status: Never Used  Substance and Sexual Activity   Alcohol use: No   Drug use: No   Sexual activity: Not Currently  Other Topics Concern   Not on file  Social History Narrative   Married x 37 years. Irving Burton)    Social Drivers of Health   Financial Resource Strain: Low Risk  (05/14/2023)   Overall Financial Resource Strain (CARDIA)    Difficulty of Paying Living Expenses: Not hard at all  Food Insecurity: No Food Insecurity (05/14/2023)   Hunger Vital Sign    Worried About Running Out of Food in the Last Year: Never true    Ran Out of Food in the Last Year: Never true  Transportation Needs: No Transportation Needs (05/14/2023)   PRAPARE - Doctor, general practice (Medical): No    Lack of Transportation (Non-Medical): No  Physical Activity: Insufficiently Active (05/14/2023)   Exercise Vital Sign    Days of Exercise per Week: 3 days    Minutes of Exercise per Session: 20 min  Stress: No Stress Concern Present (05/14/2023)   [employer] of Occupational Health - Occupational Stress Questionnaire    Feeling of Stress : Not at all  Social Connections: Socially Isolated (05/14/2023)   Social Connection and Isolation Panel [NHANES]    Frequency of Communication  with Friends and Family: Three times a week    Frequency of Social Gatherings with Friends and Family: Never    Attends Religious Services: Never    Database administrator or Organizations: No    Attends Banker Meetings: Never    Marital Status: Widowed    Tobacco Counseling Counseling given: Not Answered   Clinical Intake:  Pre-visit preparation completed: Yes  Pain : 0-10 Pain Score: 8  Pain Type: Chronic pain Pain Location: Foot Pain Orientation: Left Pain Descriptors / Indicators: Burning, Aching, Dull, Constant Pain Onset: More than a month ago Pain Frequency: Intermittent Pain Relieving Factors: percocet  Pain Relieving Factors: percocet  Diabetes: No  How often do you need to have someone help you when you read instructions, pamphlets, or other written materials from your doctor or pharmacy?: 1 - Never  Interpreter Needed?: No  Information entered by :: Remi Haggard LPN   Activities of Daily Living    05/14/2023    8:32 AM 09/11/2022    8:10 PM  In your present state of health, do you have any difficulty performing the following activities:  Hearing? 0 0  Vision? 0 0  Difficulty concentrating or making decisions? 0 0  Walking or climbing stairs? 0 0  Dressing or bathing? 0 0  Doing errands, shopping? 0 0  Preparing Food and eating ? N   Using the Toilet? N   In the past six months, have you accidently leaked urine? N   Do  you have problems with loss of bowel control? N   Managing your Medications? N   Managing your Finances? N   Housekeeping or managing your Housekeeping? N     Patient Care Team: [name], MD as PCP - General (Family Medicine) Nahser, Deloris Ping, MD as PCP - Cardiology (Cardiology) Marinus Maw, MD as PCP - Electrophysiology (Cardiology) Clinton Gallant, RN as [facility] Care Management Juanda Chance, NP as Nurse Practitioner (Physical Medicine and Rehabilitation) Jamison Neighbor, MD (Urology)  Indicate any recent Medical Services you may have received from other than [facility] providers in the past year (date may be approximate).     Assessment:   This is a routine wellness examination for Fenner.  Hearing/Vision screen Hearing Screening - Comments:: No trouble hearing Vision Screening - Comments:: Not up to date Education provided   Goals Addressed   None    Depression Screen    05/14/2023    8:38 AM 05/19/2022    8:05 AM 05/15/2022   11:36 AM 05/08/2022    9:05 AM 03/31/2022   10:54 AM 12/18/2021    9:18 PM 05/02/2021    9:09 AM  PHQ 2/9 Scores  PHQ - 2 Score 3 0 0 0 0 0 0  PHQ- 9 Score 10          Fall Risk    05/14/2023    8:28 AM 05/19/2022    8:05 AM 03/31/2022   10:54 AM 05/02/2021    9:13 AM  Fall Risk   Falls in the past year? 1 0 0 0  Number falls in past yr: 0 0 0 0  Injury with Fall? 0 0 0 0  Risk for fall due to : Impaired mobility No Fall Risks No Fall Risks No Fall Risks  Follow up Falls evaluation completed;Education provided;Falls prevention discussed Falls prevention discussed Falls prevention discussed Falls prevention discussed    MEDICARE RISK AT HOME: Medicare Risk at Home  Any stairs in or around the home?: No If so, are there any without handrails?: No Home free of loose throw rugs in walkways, pet beds, electrical cords, etc?: Yes Adequate lighting in your home to reduce risk of falls?: Yes Life alert?: No Use of a  cane, walker or w/c?: No Grab bars in the bathroom?: Yes Shower chair or bench in shower?: Yes Elevated toilet seat or a handicapped toilet?: Yes  TIMED UP AND GO:  Was the test performed?  No    Cognitive Function:        05/14/2023    8:33 AM 05/08/2022    9:06 AM 05/02/2021    9:17 AM  6CIT Screen  What Year? 0 points 0 points 0 points  What month? 0 points 0 points 0 points  What time? 0 points 0 points 0 points  Count back from 20 2 points 0 points 0 points  Months in reverse 4 points 0 points 4 points  Repeat phrase 2 points 0 points 0 points  Total Score 8 points 0 points 4 points    Immunizations Immunization History  Administered Date(s) Administered   Influenza Split 12/30/2010   Influenza,inj,Quad PF,6+ Mos 01/13/2013, 05/23/2013, 01/12/2014, 04/27/2015, 01/08/2016, 06/02/2016, 01/06/2017, 01/26/2018, 01/26/2019, 02/15/2020, 02/07/2022   PFIZER(Purple Top)SARS-COV-2 Vaccination 07/28/2019, 08/20/2019   Pfizer Covid-19 Vaccine Bivalent Booster 52yrs & up 04/11/2020, 02/20/2021   Pneumococcal Conjugate-13 11/07/2016   Pneumococcal Polysaccharide-23 05/23/2013, 06/06/2019   Td 10/04/2010   Tdap 10/04/2010, 12/12/2019    TDAP status: Up to date  Flu Vaccine status: Due, Education has been provided regarding the importance of this vaccine. Advised may receive this vaccine at local pharmacy or Health Dept. Aware to provide a copy of the vaccination record if obtained from local pharmacy or Health Dept. Verbalized acceptance and understanding.  Pneumococcal vaccine status: Up to date  Covid-19 vaccine status: Information provided on how to obtain vaccines.   Qualifies for Shingles Vaccine? Yes   Zostavax completed No   Shingrix Completed?: No.    Education has been provided regarding the importance of this vaccine. Patient has been advised to call insurance company to determine out of pocket expense if they have not yet received this vaccine. Advised may also receive  vaccine at local pharmacy or Health Dept. Verbalized acceptance and understanding.  Screening Tests Health Maintenance  Topic Date Due   COVID-19 Vaccine (5 - 2024-25 season) 06/02/2023 (Originally 12/28/2022)   Zoster Vaccines- Shingrix (1 of 2) 07/08/2023 (Originally [birth year]01/1981)   INFLUENZA VACCINE  07/27/2023 (Originally 11/27/2022)   Lung Cancer Screening  12/18/2023   Medicare Annual Wellness (AWV)  05/13/2024   Pneumococcal Vaccine 72-42 Years old (4 of 4 - PPSV23 or PCV20) 08/02/2027   Colonoscopy  06/08/2029   DTaP/Tdap/Td (4 - Td or Tdap) 12/11/2029   Hepatitis C Screening  Completed   HIV Screening  Completed   HPV VACCINES  Aged Out    Health Maintenance  There are no preventive care reminders to display for this patient.   Colorectal cancer screening: Type of screening: Colonoscopy. Completed 2021. Repeat every 10 years  Lung Cancer Screening: (Low Dose CT Chest recommended if Age 10-80 years, 20 pack-year currently smoking OR have quit w/in 15years.) does qualify.   Lung Cancer Screening Referral: ordered  Additional Screening:  Hepatitis C Screening: does not qualify; Completed 2018  Vision Screening: Recommended annual ophthalmology exams for early detection of glaucoma and other disorders of the eye. Is the patient up to date with their  annual eye exam?  No  Who is the provider or what is the name of the office in which the patient attends annual eye exams? Education provided If pt is not established with a provider, would they like to be referred to a provider to establish care?  Education provided .   Dental Screening: Recommended annual dental exams for proper oral hygiene    Community Resource Referral / Chronic Care Management: CRR required this visit?  No   CCM required this visit?  No     Plan:     I have personally reviewed and noted the following in the patient's chart:   Medical and social history Use of alcohol, tobacco or illicit drugs   Current medications and supplements including opioid prescriptions. Patient is currently taking opioid prescriptions. Information provided to patient regarding non-opioid alternatives. Patient advised to discuss non-opioid treatment plan with their provider. Functional ability and status Nutritional status Physical activity Advanced directives List of other physicians Hospitalizations, surgeries, and ER visits in previous 12 months Vitals Screenings to include cognitive, depression, and falls Referrals and appointments  In addition, I have reviewed and discussed with patient certain preventive protocols, quality metrics, and best practice recommendations. A written personalized care plan for preventive services as well as general preventive health recommendations were provided to patient.     Remi Haggard, LPN   01/10/7828   After Visit Summary: (MyChart) Due to this being a telephonic visit, the after visit summary with patients personalized plan was offered to patient via MyChart   Nurse Notes:

## 2023-06-08 DIAGNOSIS — G8921 Chronic pain due to trauma: Secondary | ICD-10-CM | POA: Diagnosis not present

## 2023-06-08 DIAGNOSIS — M25572 Pain in left ankle and joints of left foot: Secondary | ICD-10-CM | POA: Diagnosis not present

## 2023-06-08 DIAGNOSIS — M79672 Pain in left foot: Secondary | ICD-10-CM | POA: Diagnosis not present

## 2023-06-08 DIAGNOSIS — Z5181 Encounter for therapeutic drug level monitoring: Secondary | ICD-10-CM | POA: Diagnosis not present

## 2023-06-08 DIAGNOSIS — Z79899 Other long term (current) drug therapy: Secondary | ICD-10-CM | POA: Diagnosis not present

## 2023-06-20 ENCOUNTER — Emergency Department (HOSPITAL_COMMUNITY)
Admission: EM | Admit: 2023-06-20 | Discharge: 2023-06-20 | Disposition: A | Discharge status: Home / Self Care | Payer: Medicare Other | Attending: Emergency Medicine | Admitting: Emergency Medicine

## 2023-06-20 ENCOUNTER — Other Ambulatory Visit: Payer: Self-pay

## 2023-06-20 ENCOUNTER — Emergency Department (HOSPITAL_COMMUNITY): Payer: Medicare Other

## 2023-06-20 ENCOUNTER — Encounter (HOSPITAL_COMMUNITY): Payer: Self-pay

## 2023-06-20 DIAGNOSIS — I110 Hypertensive heart disease with heart failure: Secondary | ICD-10-CM | POA: Insufficient documentation

## 2023-06-20 DIAGNOSIS — Z79899 Other long term (current) drug therapy: Secondary | ICD-10-CM | POA: Insufficient documentation

## 2023-06-20 DIAGNOSIS — J441 Chronic obstructive pulmonary disease with (acute) exacerbation: Secondary | ICD-10-CM | POA: Insufficient documentation

## 2023-06-20 DIAGNOSIS — I509 Heart failure, unspecified: Secondary | ICD-10-CM | POA: Diagnosis not present

## 2023-06-20 DIAGNOSIS — I2510 Atherosclerotic heart disease of native coronary artery without angina pectoris: Secondary | ICD-10-CM | POA: Insufficient documentation

## 2023-06-20 DIAGNOSIS — Z7902 Long term (current) use of antithrombotics/antiplatelets: Secondary | ICD-10-CM | POA: Diagnosis not present

## 2023-06-20 DIAGNOSIS — J449 Chronic obstructive pulmonary disease, unspecified: Secondary | ICD-10-CM | POA: Diagnosis not present

## 2023-06-20 DIAGNOSIS — Z951 Presence of aortocoronary bypass graft: Secondary | ICD-10-CM | POA: Insufficient documentation

## 2023-06-20 DIAGNOSIS — R0602 Shortness of breath: Secondary | ICD-10-CM | POA: Diagnosis not present

## 2023-06-20 DIAGNOSIS — J101 Influenza due to other identified influenza virus with other respiratory manifestations: Secondary | ICD-10-CM | POA: Diagnosis not present

## 2023-06-20 LAB — RESP PANEL BY RT-PCR (RSV, FLU A&B, COVID)  RVPGX2
Influenza A by PCR: POSITIVE — AB
Influenza B by PCR: NEGATIVE
Resp Syncytial Virus by PCR: NEGATIVE
SARS Coronavirus 2 by RT PCR: NEGATIVE

## 2023-06-20 MED ORDER — ALBUTEROL SULFATE HFA 108 (90 BASE) MCG/ACT IN AERS
2.0000 | INHALATION_SPRAY | RESPIRATORY_TRACT | Status: DC | PRN
  Administered 2023-06-20: 2 via RESPIRATORY_TRACT
  Filled 2023-06-20: qty 6.7

## 2023-06-20 MED ORDER — ACETAMINOPHEN 500 MG PO TABS
1000.0000 mg | ORAL_TABLET | Freq: Once | ORAL | Status: AC
  Administered 2023-06-20: 1000 mg via ORAL
  Filled 2023-06-20: qty 2

## 2023-06-20 MED ORDER — IPRATROPIUM-ALBUTEROL 0.5-2.5 (3) MG/3ML IN SOLN
3.0000 mL | Freq: Once | RESPIRATORY_TRACT | Status: AC
  Administered 2023-06-20: 3 mL via RESPIRATORY_TRACT
  Filled 2023-06-20: qty 3

## 2023-06-20 MED ORDER — PREDNISONE 20 MG PO TABS
60.0000 mg | ORAL_TABLET | Freq: Once | ORAL | Status: AC
  Administered 2023-06-20: 60 mg via ORAL
  Filled 2023-06-20: qty 3

## 2023-06-20 MED ORDER — PREDNISONE 10 MG PO TABS
40.0000 mg | ORAL_TABLET | Freq: Every day | ORAL | 0 refills | Status: AC
Start: 2023-06-20 — End: 2023-06-25

## 2023-06-20 MED ORDER — BENZONATATE 100 MG PO CAPS
100.0000 mg | ORAL_CAPSULE | Freq: Three times a day (TID) | ORAL | 0 refills | Status: DC
Start: 2023-06-20 — End: 2023-08-26

## 2023-06-20 MED ORDER — IPRATROPIUM-ALBUTEROL 0.5-2.5 (3) MG/3ML IN SOLN
3.0000 mL | RESPIRATORY_TRACT | Status: AC
  Administered 2023-06-20 (×2): 3 mL via RESPIRATORY_TRACT
  Filled 2023-06-20: qty 6

## 2023-06-20 NOTE — ED Triage Notes (Signed)
 Pt. Arrives POV c/o SOB since yesterday. Pt. C/o full body pain. States that he has been hot and cold all day. Also states that his daughter has been sick and may have gotten something from her. Also c/o stuffy nose and a cough.

## 2023-06-20 NOTE — ED Provider Notes (Signed)
 [place] EMERGENCY DEPARTMENT AT [facility] Provider Note   CSN: [id number] Arrival date & time: 06/20/23  2028     History {Add pertinent medical, surgical, social history, OB history to HPI:1} Chief Complaint  Patient presents with   Shortness of Breath    [name] is a 63 y.o. male.   Shortness of Breath      Home Medications Prior to Admission medications   Medication Sig Start Date End Date Taking? Authorizing Provider  amLODipine (NORVASC) 10 MG tablet TAKE 1 TABLET(10 MG) BY MOUTH DAILY 10/29/22   Nahser, Deloris Ping, MD  atorvastatin (LIPITOR) 80 MG tablet TAKE 1 TABLET BY MOUTH EVERY NIGHT AT BEDTIME 01/15/23   [name], MD  clopidogrel (PLAVIX) 75 MG tablet TAKE 1 TABLET(75 MG) BY MOUTH DAILY 04/08/23   Nahser, Deloris Ping, MD  Evolocumab (REPATHA SURECLICK) 140 MG/ML SOAJ Inject 140 mg into the skin every 14 (fourteen) days. 12/05/22   Nahser, Deloris Ping, MD  furosemide (LASIX) 20 MG tablet Take 1 tablet (20 mg total) by mouth every other day. 10/08/22 01/06/23  Tereso Newcomer T, PA-C  gabapentin (NEURONTIN) 800 MG tablet Take 800 mg by mouth 3 (three) times daily. 03/22/21   [provider]  losartan (COZAAR) 100 MG tablet TAKE 1 TABLET(100 MG) BY MOUTH DAILY 02/02/23   Nahser, Deloris Ping, MD  naloxone [facility]) nasal spray 4 mg/0.1 mL Place 1 spray into the nose once. 11/24/22   [provider]  oxyCODONE-acetaminophen (PERCOCET) 10-325 MG tablet Take 1 tablet by mouth every 6 (six) hours as needed. 04/25/21   [provider]  sodium bicarbonate 650 MG tablet Take 1 tablet (650 mg total) by mouth daily. 09/15/22   Shon Hale, MD      Allergies    Rosuvastatin    Review of Systems   Review of Systems  Respiratory:  Positive for shortness of breath.     Physical Exam Updated Vital Signs BP (!) 159/66 (BP Location: Left Arm)   Pulse (!) 113   Temp 99.1 F (37.3 C) (Oral)   Resp 19   SpO2 96%  Physical Exam  ED  Results / Procedures / Treatments   Labs (all labs ordered are listed, but only abnormal results are displayed) Labs Reviewed  RESP PANEL BY RT-PCR (RSV, FLU A&B, COVID)  RVPGX2 - Abnormal; Notable for the following components:      Result Value   Influenza A by PCR POSITIVE (*)    All other components within normal limits    EKG EKG Interpretation Date/Time:  Saturday June 20 2023 20:48:03 EST Ventricular Rate:  104 PR Interval:  137 QRS Duration:  114 QT Interval:  333 QTC Calculation: 438 R Axis:   16  Text Interpretation: Sinus tachycardia Multiple ventricular premature complexes Probable left atrial enlargement Borderline intraventricular conduction delay Nonspecific repol abnormality, diffuse leads Confirmed by Ernie Avena ([id number]) on 06/20/2023 9:43:21 PM  Radiology DG Chest Port 1 View Result Date: 06/20/2023 CLINICAL DATA:  Shortness of breath EXAM: PORTABLE CHEST 1 VIEW COMPARISON:  09/13/2022 FINDINGS: Prior CABG. Heart borderline in size. Lungs clear. No effusions or edema. No acute bony abnormality. IMPRESSION: No active disease. Electronically Signed   By: Charlett Nose M.D.   On: 06/20/2023 21:10    Procedures Procedures  {Document cardiac monitor, telemetry assessment procedure when appropriate:1}  Medications Ordered in ED Medications  albuterol (VENTOLIN HFA) 108 (90 Base) MCG/ACT inhaler 2 puff (2 puffs Inhalation Given 06/20/23  2048)    ED Course/ Medical Decision Making/ A&P Clinical Course as of 06/20/23 2200  Sat Jun 20, 2023  2149 Influenza A By PCR(!): POSITIVE [JL]    Clinical Course User Index [JL] Ernie Avena, MD   {   Click here for ABCD2, HEART and other calculatorsREFRESH Note before signing :1}                              Medical Decision Making Amount and/or Complexity of Data Reviewed Labs:  Decision-making details documented in ED Course.   ***  {Document critical care time when appropriate:1} {Document review of labs and  clinical decision tools ie heart score, Chads2Vasc2 etc:1}  {Document your independent review of radiology images, and any outside records:1} {Document your discussion with family members, caretakers, and with consultants:1} {Document social determinants of health affecting pt's care:1} {Document your decision making why or why not admission, treatments were needed:1} Final Clinical Impression(s) / ED Diagnoses Final diagnoses:  None    Rx / DC Orders ED Discharge Orders     None

## 2023-06-20 NOTE — Discharge Instructions (Addendum)
 You tested positive for influenza A.  This also likely triggered a mild COPD exacerbation for which we will treat you with a course of steroids.  You are outside the window for Tamiflu.  Recommend you continue breathing treatments at home, cough medicine and steroids have been prescribed, continue supportive care with Tylenol as well as continued oral hydration.

## 2023-06-23 ENCOUNTER — Telehealth: Payer: Self-pay

## 2023-06-23 NOTE — Transitions of Care (Post Inpatient/ED Visit) (Signed)
 06/23/2023  Name: [name] MRN: [id number] DOB: [birth year]  Today's TOC FU Call Status: Today's TOC FU Call Status:: Successful TOC FU Call Completed TOC FU Call Complete Date: 06/23/23 Patient's Name and Date of Birth confirmed.  Transition Care Management Follow-up Telephone Call Date of Discharge: 06/20/23 Discharge Facility: [facility]) Type of Discharge: Emergency Department Reason for ED Visit: Other: (influenza) How have you been since you were released from the hospital?: Better Any questions or concerns?: No  Items Reviewed: Did you receive and understand the discharge instructions provided?: Yes Medications obtained,verified, and reconciled?: Yes (Medications Reviewed) Any new allergies since your discharge?: No Dietary orders reviewed?: Yes Do you have support at home?: Yes People in Home: spouse  Medications Reviewed Today: Medications Reviewed Today     Reviewed by Karena Addison, LPN (Licensed Practical Nurse) on 06/23/23 at 1255  Med List Status: <None>   Medication Order Taking? Sig Documenting Provider Last Dose Status Informant  amLODipine (NORVASC) 10 MG tablet [id number] No TAKE 1 TABLET(10 MG) BY MOUTH DAILY Nahser, Deloris Ping, MD Taking Active   atorvastatin (LIPITOR) 80 MG tablet [id number] No TAKE 1 TABLET BY MOUTH EVERY NIGHT AT BEDTIME [name], MD Taking Active   benzonatate (TESSALON) 100 MG capsule [id number]  Take 1 capsule (100 mg total) by mouth every 8 (eight) hours. Ernie Avena, MD  Active   clopidogrel (PLAVIX) 75 MG tablet [id number] No TAKE 1 TABLET(75 MG) BY MOUTH DAILY Nahser, Deloris Ping, MD Taking Active   Evolocumab [facility] SURECLICK) 140 MG/ML [place] [id number] No Inject 140 mg into the skin every 14 (fourteen) days. Nahser, Deloris Ping, MD Taking Active   furosemide (LASIX) 20 MG tablet [id number] No Take 1 tablet (20 mg total) by mouth every other day. Tereso Newcomer T, PA-C Taking Expired 01/06/23 2359   gabapentin  (NEURONTIN) 800 MG tablet [id number] No Take 800 mg by mouth 3 (three) times daily. [provider] Taking Active Self, Pharmacy Records  losartan (COZAAR) 100 MG tablet [id number] No TAKE 1 TABLET(100 MG) BY MOUTH DAILY Nahser, Deloris Ping, MD Taking Active   naloxone [facility]) nasal spray 4 mg/0.1 mL [id number] No Place 1 spray into the nose once. [provider] Taking Active   oxyCODONE-acetaminophen (PERCOCET) 10-325 MG tablet [id number] No Take 1 tablet by mouth every 6 (six) hours as needed. [provider] Taking Active Self, Pharmacy Records  predniSONE (DELTASONE) 10 MG tablet [id number]  Take 4 tablets (40 mg total) by mouth daily for 5 days. Ernie Avena, MD  Active   sodium bicarbonate 650 MG tablet [id number] No Take 1 tablet (650 mg total) by mouth daily. Shon Hale, MD Taking Active             Home Care and Equipment/Supplies: Were Home Health Services Ordered?: NA Any new equipment or medical supplies ordered?: NA  Functional Questionnaire: Do you need assistance with bathing/showering or dressing?: No Do you need assistance with meal preparation?: No Do you need assistance with eating?: No Do you have difficulty maintaining continence: No Do you need assistance with getting out of bed/getting out of a chair/moving?: No Do you have difficulty managing or taking your medications?: No  Follow up appointments reviewed: PCP Follow-up appointment confirmed?: Yes Date of PCP follow-up appointment?: 06/25/23 Follow-up Provider: [facility] Follow-up appointment confirmed?: NA Do you need transportation to your follow-up appointment?: No Do you understand care options if your condition(s) worsen?: Yes-patient verbalized understanding    SIGNATURE Karena Addison,  LPN [facility] Nurse Health Advisor Direct Dial [phone]

## 2023-06-25 ENCOUNTER — Encounter: Payer: Self-pay | Admitting: Family Medicine

## 2023-06-25 ENCOUNTER — Ambulatory Visit (INDEPENDENT_AMBULATORY_CARE_PROVIDER_SITE_OTHER): Payer: Medicare Other | Admitting: Family Medicine

## 2023-06-25 VITALS — BP 132/86 | HR 53 | Temp 97.6°F | Ht 69.0 in | Wt 251.2 lb

## 2023-06-25 DIAGNOSIS — J441 Chronic obstructive pulmonary disease with (acute) exacerbation: Secondary | ICD-10-CM | POA: Diagnosis not present

## 2023-06-25 MED ORDER — LEVOFLOXACIN 500 MG PO TABS: 500.0000 mg | ORAL_TABLET | Freq: Every day | ORAL | 0 refills | Status: AC

## 2023-06-25 MED ORDER — PREDNISONE 20 MG PO TABS
ORAL_TABLET | ORAL | 0 refills | Status: DC
Start: 2023-06-25 — End: 2023-10-11

## 2023-06-25 NOTE — Progress Notes (Signed)
 Subjective:  Patient was seen at the hospital on February 22 and diagnosed with a COPD exacerbation triggered by influenza.  Patient states he finished the prednisone.  He is using his albuterol every 6 hours.  However he continues to experience shortness of breath and chest congestion.  On exam today, the patient is audibly wheezing in all 4 lung fields.  He has diminished breath sounds.  He has rhonchorous breath sounds throughout.  He has right basilar crackles.  Cough is nonproductive.  However he reports congestion in his chest that he is unable to "break up". Past Medical History:  Diagnosis Date   (HFpEF) heart failure with preserved ejection fraction (HCC) 10/23/2022   TTE 09/12/2022: EF 55-60, no RWMA, mild LVH, mildly reduced RVSF, normal PASP, severe LAE, moderate RAE, moderate MAC, mild AV calcification, ascending aorta 39 mm, RAP 3   Acute MI, 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   Chest pain    COPD (chronic obstructive pulmonary disease) (HCC)    Coronary artery disease    STATUS POST CABG 01/2009   Dyslipidemia    Hyperlipidemia    Hypertension    Orthostatic hypotension    Peripheral vascular disease (HCC)    PVC's (premature ventricular contractions) 10/01/2022   Zio 09/2022: 19.1% PVCs, Avg HR 63  TTE 08/2022: EF 55-60   Syncope and collapse    Past Surgical History:  Procedure Laterality Date   ANKLE SURGERY     x 2   COLONOSCOPY  April 2013   CORONARY ANGIOPLASTY     CORONARY ARTERY BYPASS GRAFT  2010   FOOT SURGERY     HERNIA REPAIR  01/23/12   umb hernia   Kidney stimulator     LEFT HEART CATH AND CORS/GRAFTS ANGIOGRAPHY N/A 06/02/2016   Procedure: Left Heart Cath and Cors/Grafts Angiography;  Surgeon: Runell Gess, MD;  Location: [facility] INVASIVE CV LAB;  Service: Cardiovascular;  Laterality: N/A;   LEFT HEART CATHETERIZATION WITH CORONARY/GRAFT ANGIOGRAM N/A 07/08/2012   Procedure: LEFT HEART CATHETERIZATION WITH Isabel Caprice;  Surgeon: Vesta Mixer, MD;   Location: [facility] CATH LAB;  Service: Cardiovascular;  Laterality: N/A;   PR VEIN BYPASS GRAFT,AORTO-FEM-POP     STERNAL WIRES REMOVAL  07/08/2011   Procedure: STERNAL WIRES REMOVAL;  Surgeon: [name], MD;  Location: [facility] OR;  Service: Open Heart Surgery;  Laterality: N/A;  removal of 4th wire   Current Outpatient Medications on File Prior to Visit  Medication Sig Dispense Refill   amLODipine (NORVASC) 10 MG tablet TAKE 1 TABLET(10 MG) BY MOUTH DAILY 90 tablet 1   atorvastatin (LIPITOR) 80 MG tablet TAKE 1 TABLET BY MOUTH EVERY NIGHT AT BEDTIME 90 tablet 1   benzonatate (TESSALON) 100 MG capsule Take 1 capsule (100 mg total) by mouth every 8 (eight) hours. 21 capsule 0   clopidogrel (PLAVIX) 75 MG tablet TAKE 1 TABLET(75 MG) BY MOUTH DAILY 90 tablet 2   Evolocumab (REPATHA SURECLICK) 140 MG/ML SOAJ Inject 140 mg into the skin every 14 (fourteen) days. 6 mL 3   gabapentin (NEURONTIN) 800 MG tablet Take 800 mg by mouth 3 (three) times daily.     losartan (COZAAR) 100 MG tablet TAKE 1 TABLET(100 MG) BY MOUTH DAILY 90 tablet 3   naloxone (NARCAN) nasal spray 4 mg/0.1 mL Place 1 spray into the nose once.     oxyCODONE-acetaminophen (PERCOCET) 10-325 MG tablet Take 1 tablet by mouth every 6 (six) hours as needed.     predniSONE (DELTASONE)  10 MG tablet Take 4 tablets (40 mg total) by mouth daily for 5 days. 20 tablet 0   sodium bicarbonate 650 MG tablet Take 1 tablet (650 mg total) by mouth daily. 30 tablet 1   furosemide (LASIX) 20 MG tablet Take 1 tablet (20 mg total) by mouth every other day. 90 tablet 3   No current facility-administered medications on file prior to visit.     Allergies  Allergen Reactions   Rosuvastatin     Muscle aches. Other reaction(s): leg pain, Other Muscle aches. Other reaction(s): leg pain Other reaction(s): leg pain    Social History   Socioeconomic History   Marital status: Widowed    Spouse name: Irving Burton   Number of children: 2   Years of education:  Not on file   Highest education level: Not on file  Occupational History   Occupation: Disability  Tobacco Use   Smoking status: Former    Current packs/day: 0.00    Average packs/day: 3.0 packs/day for 30.0 years (90.0 ttl pk-yrs)    Types: Cigarettes, Cigars    Start date: 01/27/1979    Quit date: 01/26/2009    Years since quitting: 14.4    Passive exposure: Past   Smokeless tobacco: Never  Vaping Use   Vaping status: Never Used  Substance and Sexual Activity   Alcohol use: No   Drug use: No   Sexual activity: Not Currently  Other Topics Concern   Not on file  Social History Narrative   Married x 37 years. Irving Burton)    Social Drivers of Health   Financial Resource Strain: Low Risk  (05/14/2023)   Overall Financial Resource Strain (CARDIA)    Difficulty of Paying Living Expenses: Not hard at all  Food Insecurity: No Food Insecurity (05/14/2023)   Hunger Vital Sign    Worried About Running Out of Food in the Last Year: Never true    Ran Out of Food in the Last Year: Never true  Transportation Needs: No Transportation Needs (05/14/2023)   PRAPARE - Administrator, Civil Service (Medical): No    Lack of Transportation (Non-Medical): No  Physical Activity: Insufficiently Active (05/14/2023)   Exercise Vital Sign    Days of Exercise per Week: 3 days    Minutes of Exercise per Session: 20 min  Stress: No Stress Concern Present (05/14/2023)   [employer] of Occupational Health - Occupational Stress Questionnaire    Feeling of Stress : Not at all  Social Connections: Socially Isolated (05/14/2023)   Social Connection and Isolation Panel [NHANES]    Frequency of Communication with Friends and Family: Three times a week    Frequency of Social Gatherings with Friends and Family: Never    Attends Religious Services: Never    Database administrator or Organizations: No    Attends Banker Meetings: Never    Marital Status: Widowed  Intimate Partner  Violence: Not At Risk (05/14/2023)   Humiliation, Afraid, Rape, and Kick questionnaire    Fear of Current or Ex-Partner: No    Emotionally Abused: No    Physically Abused: No    Sexually Abused: No   Family History  Problem Relation Age of Onset   Coronary artery disease Father 77   Diabetes Father    Hyperlipidemia Father    Heart disease Father        before age 20   Hypertension Father    Cancer Mother  uterine   Hyperlipidemia Mother    Diabetes Mother    Hypertension Mother    Hypertension Brother      Review of Systems  All other systems reviewed and are negative.      Objective:   Physical Exam Vitals reviewed.  Constitutional:      General: He is not in acute distress.    Appearance: He is well-developed. He is not diaphoretic.  HENT:     Head: Normocephalic and atraumatic.     Right Ear: Tympanic membrane, ear canal and external ear normal.     Left Ear: Tympanic membrane, ear canal and external ear normal.     Nose: No congestion or rhinorrhea.     Right Sinus: Maxillary sinus tenderness present.     Left Sinus: Maxillary sinus tenderness present.     Mouth/Throat:     Pharynx: No oropharyngeal exudate.  Neck:     Thyroid: No thyromegaly.     Vascular: No JVD.     Trachea: No tracheal deviation.  Cardiovascular:     Rate and Rhythm: Normal rate and regular rhythm.     Heart sounds: Normal heart sounds. No murmur heard.    No friction rub. No gallop.  Pulmonary:     Effort: Pulmonary effort is normal. No respiratory distress.     Breath sounds: No stridor. Wheezing, rhonchi and rales present.  Chest:     Chest wall: No tenderness.  Musculoskeletal:        General: No tenderness or deformity. Normal range of motion.     Cervical back: Normal range of motion and neck supple.  Lymphadenopathy:     Cervical: No cervical adenopathy.  Skin:    General: Skin is warm.     Coloration: Skin is not pale.     Findings: No erythema or rash.   Neurological:     Mental Status: He is alert and oriented to person, place, and time.     Cranial Nerves: No cranial nerve deficit.     Motor: No abnormal muscle tone.     Coordination: Coordination normal.     Deep Tendon Reflexes: Reflexes are normal and symmetric.  Psychiatric:        Behavior: Behavior normal.        Thought Content: Thought content normal.        Judgment: Judgment normal.           Assessment & Plan:  COPD exacerbation (HCC) I believe he is having a COPD exacerbation and possibly early community-acquired pneumonia.  Begin Levaquin 500 mg daily for 7 days.  Begin prednisone taper pack for 6 days.  Use albuterol 2 puffs every 4-6 hours as needed.  Reassess in 1 week if no better or sooner if worse

## 2023-06-30 ENCOUNTER — Other Ambulatory Visit (HOSPITAL_COMMUNITY): Payer: Self-pay

## 2023-06-30 ENCOUNTER — Telehealth: Payer: Self-pay | Admitting: Pharmacy Technician

## 2023-06-30 DIAGNOSIS — E782 Mixed hyperlipidemia: Secondary | ICD-10-CM

## 2023-06-30 NOTE — Telephone Encounter (Signed)
 Pharmacy Patient Advocate Encounter   Received notification from Fax that prior authorization for REPATHA is required/requested.   Insurance verification completed.   The patient is insured through [employer] .   Per test claim: PA required; PA started via CoverMyMeds. KEY [id number] . Please see clinical question(s) below that I am not finding the answer to in her chart and advise.      THE LAST LDL LABS I SEE IS FROM 05/19/2022. CAN THE PATIENT GET UPDATED LDL LABS PLEASE?

## 2023-07-01 ENCOUNTER — Encounter: Payer: Self-pay | Admitting: Pharmacist

## 2023-07-01 NOTE — Telephone Encounter (Signed)
 Mychart message sent to patient to come for labs

## 2023-07-02 DIAGNOSIS — E782 Mixed hyperlipidemia: Secondary | ICD-10-CM | POA: Diagnosis not present

## 2023-07-02 NOTE — Telephone Encounter (Signed)
 I spoke to patient and he will go to the lab tomorrow for lab work

## 2023-07-03 ENCOUNTER — Encounter: Payer: Self-pay | Admitting: Pharmacist

## 2023-07-03 LAB — LIPID PANEL
Chol/HDL Ratio: 2.6 ratio (ref 0.0–5.0)
Cholesterol, Total: 104 mg/dL (ref 100–199)
HDL: 40 mg/dL (ref >39)
LDL Chol Calc (NIH): 9 mg/dL (ref 0–99)
Triglycerides: 417 mg/dL — ABNORMAL HIGH (ref 0–149)
VLDL Cholesterol Cal: 55 mg/dL — ABNORMAL HIGH (ref 5–40)

## 2023-07-03 NOTE — Telephone Encounter (Signed)
 Labs for PA have resulted

## 2023-07-03 NOTE — Telephone Encounter (Signed)
 Pharmacy Patient Advocate Encounter   Received notification from Pt Calls Messages that prior authorization for repatha is required/requested.   Insurance verification completed.   The patient is insured through [place] .   Per test claim: PA required; PA submitted to above mentioned insurance via CoverMyMeds Key/confirmation #/EOC [facility] Status is pending

## 2023-07-06 ENCOUNTER — Other Ambulatory Visit (HOSPITAL_COMMUNITY): Payer: Self-pay

## 2023-07-06 NOTE — Telephone Encounter (Signed)
 Pharmacy Patient Advocate Encounter  Received notification from [facility] that Prior Authorization for Repatha has been APPROVED from 07/03/23 to 01/03/24. Unable to obtain price due to refill too soon rejection, last fill date 06/18/23 next available fill date03/13/25   PA #/Case ID/Reference #: [id number]

## 2023-07-10 ENCOUNTER — Ambulatory Visit: Payer: Self-pay | Admitting: *Deleted

## 2023-07-10 NOTE — Patient Outreach (Signed)
 Care Coordination   Follow Up Visit Note   07/10/2023 Name: [name] MRN: [id number] DOB: Mar 07, 1961  [name] is a 63 y.o. year old male who sees Pickard, Priscille Heidelberg, MD for primary care. I spoke with  [name] by phone today.  What matters to the patients health and wellness today?  Mobility issues with legs last few weeks Reports he is able to walk 50 feet at the most without assisted devices no HHPT services  Reviewed his last 06/23/23  ED visit for COPD exacerbation triggered by the flu Confimed he is doing better  He voiced understanding of the reviewed differences in treatment plan for the ED & his pcp He confirmed better relief with the pcp treatment plan on 14-Jul-2023  Wife, Irving Burton, passed on 2023-01-21 Lanora Manis, daughter is now in [place] but is his emergency contact/caregiver He lives alone, cares for himself Voiced understanding of how to outreach to his mobile phone carrier, [employer], to activate emergency 911 services, & voice 911 services   He voices understanding of changes for VBCI program and the warm transfer to new RN CM, Angel Little   Goals Addressed               This Visit's Progress     Patient Stated     COMPLETED: improve kidney functioning labs [facility]) (pt-stated)        Care Coordination Interventions: UPDATE 08/21/23 improvement  Pt reports a pending appointment on 1/22 with Dr. Tanya Nones for ongoing labs to check in any improvement with previous changes other then stop Ibuprofen and started on Tylenol. Will follow up next month with any new results or changes.  Assessed SHOD barriers with no issues reported.       manage COPD/upper respiratory symptoms, improve mobility (VBCI RN CM) (pt-stated)   Not on track     Care Coordination Interventions:UPDATE Improvement of COPD since 07/14/23 Pcp will be notified of mobility issues in last 2 weeks and treatment plan provided 07/10/23  Interventions Today    Flowsheet Row Most Recent Value   Chronic Disease   Chronic disease during today's visit Chronic Obstructive Pulmonary Disease (COPD), Other  [flu, mobility]  General Interventions   General Interventions Discussed/Reviewed General Interventions Reviewed, Durable Medical Equipment (DME), Doctor Visits, Communication with  Doctor Visits Discussed/Reviewed Doctor Visits Reviewed, PCP  Durable Medical Equipment (DME) --  Nicki Reaper he is not using any DME]  PCP/Specialist Visits Compliance with follow-up visit  Communication with PCP/Specialists, RN  Exercise Interventions   Exercise Discussed/Reviewed Exercise Discussed, Physical Activity, Assistive device use and maintanence  Physical Activity Discussed/Reviewed Physical Activity Reviewed  Education Interventions   Education Provided Provided Web-based Education, Provided Education  [leg pain, neuropathy, copd, copd diet, peripheral vascular disease]  Provided Verbal Education On [employer], Other  Mental Health Interventions   Mental Health Discussed/Reviewed Mental Health Reviewed, Coping Strategies  Pharmacy Interventions   Pharmacy Dicussed/Reviewed Pharmacy Topics Reviewed, Affording Medications  Safety Interventions   Safety Discussed/Reviewed Safety Reviewed, Fall Risk, Home Safety  Home Safety Assistive Devices  Advanced Directive Interventions   Advanced Directives Discussed/Reviewed Advanced Directives Discussed            COMPLETED: Manage right  shoulder pain (THN) (pt-stated)        Care Coordination Interventions: 07/10/23 improved  Assessed pain after steroid injection  Encouragement provided Encouraged use OTC and prescribed medication until examined by his provider. Interventions Today    Flowsheet Row Most Recent Value  Chronic Disease   Chronic disease during today's visit Other, Chronic Obstructive Pulmonary Disease (COPD), Hypertension (HTN), Chronic Kidney Disease/End Stage Renal Disease (ESRD)  General Interventions   General  Interventions Discussed/Reviewed General Interventions Reviewed, Doctor Visits  Doctor Visits Discussed/Reviewed Doctor Visits Reviewed, PCP, Specialist  PCP/Specialist Visits Compliance with follow-up visit  Mental Health Interventions   Mental Health Discussed/Reviewed Mental Health Discussed, Coping Strategies  Pharmacy Interventions   Pharmacy Dicussed/Reviewed Pharmacy Topics Discussed, Medications and their functions, Affording Medications  Safety Interventions   Safety Discussed/Reviewed Safety Discussed, Home Safety  Home Safety Assistive Devices              SDOH assessments and interventions completed:  No     Care Coordination Interventions:  Yes, provided   Follow up plan: Follow up call scheduled for 08/10/23 1100 , Angel Little    Encounter Outcome:  Patient Visit Completed

## 2023-07-10 NOTE — Patient Instructions (Addendum)
 Visit Information  Thank you for taking time to visit with me today. Please don't hesitate to contact me if I can be of assistance to you.    *[employer] offers Lifeline assistance for low-income individuals.  contact us at ([phone] 772-740-7433 to see if Lifeline service is an option in your area.  Your phone can be programed to call 911 and your emergency contacts. Please have a [employer] tech staff assist with this   Starting 07/28/23 Your new [facility], [facility] RN Care Manager will be Delsa Sale  Direct Dial: [phone]  Fax: [phone]  I never mind answering any questions as needed   Following are the goals we discussed today:   Goals Addressed               This Visit's Progress     Patient Stated     COMPLETED: improve kidney functioning labs [facility]) (pt-stated)        Care Coordination Interventions: UPDATE 08/21/23 improvement  Pt reports a pending appointment on 1/22 with Dr. Tanya Nones for ongoing labs to check in any improvement with previous changes other then stop Ibuprofen and started on Tylenol. Will follow up next month with any new results or changes.  Assessed SHOD barriers with no issues reported.       manage COPD/upper respiratory symptoms, improve mobility (VBCI RN CM) (pt-stated)   Not on track     Care Coordination Interventions:UPDATE Improvement of COPD since 06/25/23 Pcp will be notified of mobility issues in last 2 weeks and treatment plan provided 07/10/23  Interventions Today    Flowsheet Row Most Recent Value  Chronic Disease   Chronic disease during today's visit Chronic Obstructive Pulmonary Disease (COPD), Other  [flu, mobility]  General Interventions   General Interventions Discussed/Reviewed General Interventions Reviewed, Durable Medical Equipment (DME), Doctor Visits, Communication with  Doctor Visits Discussed/Reviewed Doctor Visits Reviewed, PCP  Durable Medical Equipment (DME) --  Nicki Reaper he is not  using any DME]  PCP/Specialist Visits Compliance with follow-up visit  Communication with PCP/Specialists, RN  Exercise Interventions   Exercise Discussed/Reviewed Exercise Discussed, Physical Activity, Assistive device use and maintanence  Physical Activity Discussed/Reviewed Physical Activity Reviewed  Education Interventions   Education Provided Provided Web-based Education, Provided Education  [leg pain, neuropathy, copd, copd diet, peripheral vascular disease]  Provided Verbal Education On [employer], Other  Mental Health Interventions   Mental Health Discussed/Reviewed Mental Health Reviewed, Coping Strategies  Pharmacy Interventions   Pharmacy Dicussed/Reviewed Pharmacy Topics Reviewed, Affording Medications  Safety Interventions   Safety Discussed/Reviewed Safety Reviewed, Fall Risk, Home Safety  Home Safety Assistive Devices  Advanced Directive Interventions   Advanced Directives Discussed/Reviewed Advanced Directives Discussed            COMPLETED: Manage right  shoulder pain (THN) (pt-stated)        Care Coordination Interventions: 07/10/23 improved  Assessed pain after steroid injection  Encouragement provided Encouraged use OTC and prescribed medication until examined by his provider. Interventions Today    Flowsheet Row Most Recent Value  Chronic Disease   Chronic disease during today's visit Other, Chronic Obstructive Pulmonary Disease (COPD), Hypertension (HTN), Chronic Kidney Disease/End Stage Renal Disease (ESRD)  General Interventions   General Interventions Discussed/Reviewed General Interventions Reviewed, Doctor Visits  Doctor Visits Discussed/Reviewed Doctor Visits Reviewed, PCP, Specialist  PCP/Specialist Visits Compliance with follow-up visit  Mental Health Interventions   Mental Health Discussed/Reviewed Mental Health Discussed, Coping Strategies  Pharmacy Interventions  Pharmacy Dicussed/Reviewed Pharmacy Topics Discussed, Medications and  their functions, Affording Medications  Safety Interventions   Safety Discussed/Reviewed Safety Discussed, Home Safety  Home Safety Assistive Devices              Our next appointment is by telephone on 08/10/23 at 1100 with Delsa Sale, RN CM   Please call the care guide team at [phone] if you need to cancel or reschedule your appointment.   If you are experiencing a Mental Health or Behavioral Health Crisis or need someone to talk to, please call the Suicide and Crisis Lifeline: 988 call the [place] National Suicide Prevention Lifeline: 905 439 9594 or TTY: (352) 131-4481 TTY 925-483-7304) to talk to a trained counselor call 1-800-273-TALK (toll free, 24 hour hotline) call the [facility]: [phone] call 911   Patient verbalizes understanding of instructions and care plan provided today and agrees to view in MyChart. Active MyChart status and patient understanding of how to access instructions and care plan via MyChart confirmed with patient.     The patient has been provided with contact information for the care management team and has been advised to call with any health related questions or concerns.   [name] L. Noelle Penner, RN, BSN, CCM [place]  [facility], [facility] RN Care Manager Direct Dial: [phone]  Fax: [phone]

## 2023-07-12 ENCOUNTER — Encounter: Payer: Self-pay | Admitting: Cardiovascular Disease

## 2023-07-12 NOTE — Progress Notes (Unsigned)
 Cardiology Office Note   Date:  07/13/2023   ID:  [name], DOB 12/11/1960, MRN [id number]  PCP:  [name], MD  Cardiologist:   Kristeen Miss, MD   Chief Complaint  Patient presents with   Coronary Artery Disease   Hyperlipidemia   Hypertension        1. Coronary artery disease: He status post CABG in October , 2010. He status post inferior wall myocardial infarction in January, 2011 with multiple stents to the native right coronary artery [name] 2. Peripheral vascular disease-status post iliac stenting 3. Dyslipidemia 4.. Hypertension  Notes prior to 2014:   Brand is a 63 yo with CAD and PVD.  He has had problems with his left foot and has not been able to exercise.  He continues to have lots of sternal wound pain whenever he moves his arm or lifts anything heavy. 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   [name] has had some muscle aches which resolved when he stopped his Crestor.  He has hx of severe CAD and PVD.    He has had a nagging left ankle injury that keeps him from walking much.  May 12 ,2014: He continues to have occasional CP and frequent episodes of dyspnea. He has been told that he has COPD.   He is still in a left leg splint.  August 24, 2013:  [name] was admitted to the hospital in January with chest discomfort. A Lexiscan  Myoview study was negative for ischemia. His left systolic function is normal. He's feeling a bit better.  He has been in a boot for a chronic left ankle injury for the past several years.    07/07/2014   [name] is a 63 y.o. male who presents for HTN Has been sticking to his diet.  Walks every day.  Has lost 8-9 lbs over the past several weeks.  He is very lightheaded when he first gets up in the AM.  Has been getting worse over the past month or so.  Also has frequent episodes of orthostatic hypotension.  BP has been low on  occasion. Today's BP is normal  - he is off his BP meds.  Avoids salty foods.  No CP or dyspnea. Was told to stay off his BP meds until he saw me. He is feeling better off the BP meds    Nov. 21, 2016:  Doing well.   BP was normal when he left his house this am  A bit higher here.  Exercising a bit.  Still having left leg issus.    Sep 20, 2015:  Is having some episodes of chest tightness Occurs anytime - rest or activity . Last 30 seconds.  Has not tried an NTG.  Has more foot problems ,  Not able to exercise much .  Has lots of heartburn  Issues.   Seems different than angina   Sep 07, 2017  Long is seen for follow up of his OSA  Was last seen by Lizabeth Leyden in February, 2019.  He is not had any episodes of chest pain or shortness of breath.  He is been diagnosed with obstructive sleep apnea.  He has a CPAP mask but is not tolerating it all that well.  He typically can tolerate it for about 4 hours at night.  Limited by an ankle injury,   Fell 4 years  ago , had a compund fracture of left ankle Sees the orthopedist at [facility]    BP at home is normal .   Has significant DOE.  No angina   Jan. 13, 2023 [name] is seen for follow up of his HTN, CAD, PVD  Has a right shoulder pain  Symptoms are not similar to his angina in 2010 .  Is on prednisone  He was seen up at [facility].  He had troponin levels drawn which were mildly elevated.  We will recheck troponin levels today.  EKG is unremarkable.  His symptoms are not at all similar to his previous episodes of angina.  I do not think that his right neck and shoulder pain are due to a cardiac etiology. Has COPD, CAD , HLD   May 21, 2022:  [name] is seen today for follow-up of his coronary artery disease, hypertension, peripheral vascular disease. Avoids salt  No CP ,  Has chronic COPD   He is on atorvastatin 80 mg a day, Repatha 140 mg injected every 14 days.  His last LDL is 20 HDL is 40 Total cholesterol  76 Triglyceride level is 81 Hemoglobin A1c is 5.9  ( 2021)   BP is elevated.  Needs better diet, more exercise, weight loss   Spends lots of time taking care wife [name] who has had 4 strokes. She is bed bound now.  Has limited help. Has not been able to exercise  She still smokes occasionally .    Needs to have a shoulder injection  Needs pre op clearance   He is at low risk for his shoulder injection.  He may hold his Plavix for 5 days prior to the shoulder injection.  Restart Plavix as soon as is safe from a surgical standpoint.   July 13, 2023 [name] is seen for follow up of his CAD, HTN, HLD  BP is a bit elevated.  BP is normal at home .   He would like to start going to the [place] office after my retirement in June.   Past Medical History:  Diagnosis Date   (HFpEF) heart failure with preserved ejection fraction (HCC) 10/23/2022   TTE 09/12/2022: EF 55-60, no RWMA, mild LVH, mildly reduced RVSF, normal PASP, severe LAE, moderate RAE, moderate MAC, mild AV calcification, ascending aorta 39 mm, RAP 3   Acute MI, 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   Chest pain    COPD (chronic obstructive pulmonary disease) (HCC)    Coronary artery disease    STATUS POST CABG 01/2009   Dyslipidemia    Hyperlipidemia    Hypertension    Orthostatic hypotension    Peripheral vascular disease (HCC)    PVC's (premature ventricular contractions) 10/01/2022   Zio 09/2022: 19.1% PVCs, Avg HR 63  TTE 08/2022: EF 55-60   Syncope and collapse     Past Surgical History:  Procedure Laterality Date   ANKLE SURGERY     x 2   COLONOSCOPY  April 2013   CORONARY ANGIOPLASTY     CORONARY ARTERY BYPASS GRAFT  2010   FOOT SURGERY     HERNIA REPAIR  01/23/12   umb hernia   Kidney stimulator     LEFT HEART CATH AND CORS/GRAFTS ANGIOGRAPHY N/A 06/02/2016   Procedure: Left Heart Cath and Cors/Grafts Angiography;  Surgeon: Runell Gess, MD;  Location: [facility] INVASIVE CV LAB;  Service: Cardiovascular;   Laterality: N/A;   LEFT HEART CATHETERIZATION WITH CORONARY/GRAFT ANGIOGRAM N/A 07/08/2012   Procedure:  LEFT HEART CATHETERIZATION WITH Isabel Caprice;  Surgeon: Vesta Mixer, MD;  Location: [facility] CATH LAB;  Service: Cardiovascular;  Laterality: N/A;   PR VEIN BYPASS GRAFT,AORTO-FEM-POP     STERNAL WIRES REMOVAL  07/08/2011   Procedure: STERNAL WIRES REMOVAL;  Surgeon: [name], MD;  Location: [facility] OR;  Service: Open Heart Surgery;  Laterality: N/A;  removal of 4th wire     Current Outpatient Medications  Medication Sig Dispense Refill   amLODipine (NORVASC) 10 MG tablet TAKE 1 TABLET(10 MG) BY MOUTH DAILY 90 tablet 1   atorvastatin (LIPITOR) 80 MG tablet TAKE 1 TABLET BY MOUTH EVERY NIGHT AT BEDTIME 90 tablet 1   benzonatate (TESSALON) 100 MG capsule Take 1 capsule (100 mg total) by mouth every 8 (eight) hours. 21 capsule 0   clopidogrel (PLAVIX) 75 MG tablet TAKE 1 TABLET(75 MG) BY MOUTH DAILY 90 tablet 2   Evolocumab (REPATHA SURECLICK) 140 MG/ML SOAJ Inject 140 mg into the skin every 14 (fourteen) days. 6 mL 3   gabapentin (NEURONTIN) 800 MG tablet Take 800 mg by mouth 3 (three) times daily.     losartan (COZAAR) 100 MG tablet TAKE 1 TABLET(100 MG) BY MOUTH DAILY 90 tablet 3   naloxone (NARCAN) nasal spray 4 mg/0.1 mL Place 1 spray into the nose once.     oxyCODONE-acetaminophen (PERCOCET) 10-325 MG tablet Take 1 tablet by mouth every 6 (six) hours as needed.     predniSONE (DELTASONE) 20 MG tablet 3 tabs poqday 1-2, 2 tabs poqday 3-4, 1 tab poqday 5-6 12 tablet 0   sodium bicarbonate 650 MG tablet Take 1 tablet (650 mg total) by mouth daily. 30 tablet 1   furosemide (LASIX) 20 MG tablet Take 1 tablet (20 mg total) by mouth every other day. 90 tablet 3   No current facility-administered medications for this visit.    Allergies:   Rosuvastatin    Social History:  The patient  reports that he quit smoking about 14 years ago. His smoking use included cigarettes and  cigars. He started smoking about 44 years ago. He has a 90 pack-year smoking history. He has been exposed to tobacco smoke. He has never used smokeless tobacco. He reports that he does not drink alcohol and does not use drugs.   Family History:  The patient's family history includes Cancer in his mother; Coronary artery disease (age of onset: 54) in his father; Diabetes in his father and mother; Heart disease in his father; Hyperlipidemia in his father and mother; Hypertension in his brother, father, and mother.    ROS:  Noted in current hx ,. Otherwise negativ e     Physical Exam: Blood pressure (!) 145/85, pulse 72, height 5\' 9"  (1.753 m), weight 249 lb (112.9 kg), SpO2 97%.  HYPERTENSION CONTROL Vitals:   07/13/23 0857 07/13/23 0951  BP: (!) 154/102 (!) 145/85    The patient's blood pressure is elevated above target today.  In order to address the patient's elevated BP: Blood pressure will be monitored at home to determine if medication changes need to be made.       GEN:  Well nourished, well developed in no acute distress HEENT: Normal NECK: No JVD; No carotid bruits LYMPHATICS: No lymphadenopathy CARDIAC: RRR , no murmurs, rubs, gallops RESPIRATORY:  Clear to auscultation without rales, wheezing or rhonchi  ABDOMEN: Soft, non-tender, non-distended MUSCULOSKELETAL:  No edema; No deformity  SKIN: Warm and dry NEUROLOGIC:  Alert and oriented x 3  EKG:        Recent Labs: 09/24/2022: ALT 32 10/01/2022: NT-Pro BNP 2,800 11/26/2022: BUN 20; Creatinine, Ser 1.50; Potassium 4.1; Sodium 141 07/13/2023: Hemoglobin 13.6; Platelets 152    Lipid Panel    Component Value Date/Time   CHOL 104 07/02/2023 1554   TRIG 417 (H) 07/02/2023 1554   HDL 40 07/02/2023 1554   CHOLHDL 2.6 07/02/2023 1554   CHOLHDL 1.9 05/19/2022 0820   VLDL 24 11/06/2016 0849   LDLCALC 9 07/02/2023 1554   LDLCALC 20 05/19/2022 0820   LDLDIRECT 117 (H) 02/23/2017 1549      Wt Readings from Last  3 Encounters:  07/13/23 249 lb (112.9 kg)  06/25/23 251 lb 3.2 oz (113.9 kg)  02/12/23 248 lb (112.5 kg)      Other studies Reviewed: Additional studies/ records that were reviewed today include: . Review of the above records demonstrates:    ASSESSMENT AND PLAN:  1. Coronary artery disease: He denies having any angina.  2. Peripheral vascular disease- stable   3. Dyslipidemia-   his last LDL is 9.  Continue Repatha.  4.. Hypertension-blood pressure is mildly elevated here.  He states that it is fairly normal at home.  Continue current medications.  Have encouraged him to work on calorie reduction and weight loss.    5.  Obstructive sleep [place]:     Current medicines are reviewed at length with the patient today.  The patient does not have concerns regarding medicines.  The following changes have been made:  no change  Labs/ tests ordered today include:   No orders of the defined types were placed in this encounter.   Disposition:   He would like to start going to the [place] office since it is much closer for him.    Signed, Kristeen Miss, MD  07/13/2023 5:55 PM    [facility] [address] [place], [place], [place]  [address] Phone: [phone]; Fax: [phone]

## 2023-07-13 ENCOUNTER — Ambulatory Visit: Attending: Cardiovascular Disease | Admitting: Cardiovascular Disease

## 2023-07-13 ENCOUNTER — Telehealth: Admitting: Family Medicine

## 2023-07-13 ENCOUNTER — Other Ambulatory Visit

## 2023-07-13 VITALS — BP 145/85 | HR 72 | Ht 69.0 in | Wt 249.0 lb

## 2023-07-13 DIAGNOSIS — I10 Essential (primary) hypertension: Secondary | ICD-10-CM | POA: Diagnosis not present

## 2023-07-13 DIAGNOSIS — I2510 Atherosclerotic heart disease of native coronary artery without angina pectoris: Secondary | ICD-10-CM

## 2023-07-13 DIAGNOSIS — I5032 Chronic diastolic (congestive) heart failure: Secondary | ICD-10-CM | POA: Diagnosis not present

## 2023-07-13 DIAGNOSIS — E118 Type 2 diabetes mellitus with unspecified complications: Secondary | ICD-10-CM

## 2023-07-13 DIAGNOSIS — E785 Hyperlipidemia, unspecified: Secondary | ICD-10-CM | POA: Diagnosis not present

## 2023-07-13 DIAGNOSIS — I252 Old myocardial infarction: Secondary | ICD-10-CM

## 2023-07-13 DIAGNOSIS — Z79899 Other long term (current) drug therapy: Secondary | ICD-10-CM

## 2023-07-13 NOTE — Patient Instructions (Signed)
 Follow-Up: At [facility], you and your health needs are our priority.  As part of our continuing mission to provide you with exceptional heart care, we have created designated Provider Care Teams.  These Care Teams include your primary Cardiologist (physician) and Advanced Practice Providers (APPs -  Physician Assistants and Nurse Practitioners) who all work together to provide you with the care you need, when you need it.  Your next appointment:   1 year(s)  Provider:   Simona Huh, MD

## 2023-07-13 NOTE — Progress Notes (Signed)
 Subjective:    Patient ID: [name], male    DOB: 10-26-1960, 63 y.o.   MRN: [id number]  HPI Patient is being seen today as a telephone visit.  Patient has a remote history of a myocardial infarction and peripheral vascular disease.  He also has a history of hyperlipidemia, hypertension, and type 2 diabetes mellitus.  He is long overdue for fasting lab work.  He is being seen today as a telephone visit.  Phone call began at 10:00.  Phone call concluded at 1011.  Patient consents to be seen via telephone.  He requested to be seen via telephone.  He is currently at his heart doctor's office.  Originally he was having pain all over his body specifically in his legs.  He states that he was having a pins and needle sensation in his feet when he would fall.  However he recently switched his shoes and that has stopped.  He denies any pain for the last several days.  His blood pressure at his heart doctor's office this morning was between 145 and 150.  However the patient states that he checks his blood pressure at home and is 120/80.  He denies any chest pain shortness of breath or dyspnea on exertion. Past Medical History:  Diagnosis Date   (HFpEF) heart failure with preserved ejection fraction (HCC) 10/23/2022   TTE 09/12/2022: EF 55-60, no RWMA, mild LVH, mildly reduced RVSF, normal PASP, severe LAE, moderate RAE, moderate MAC, mild AV calcification, ascending aorta 39 mm, RAP 3   Acute MI, 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   Chest pain    COPD (chronic obstructive pulmonary disease) (HCC)    Coronary artery disease    STATUS POST CABG 01/2009   Dyslipidemia    Hyperlipidemia    Hypertension    Orthostatic hypotension    Peripheral vascular disease (HCC)    PVC's (premature ventricular contractions) 10/01/2022   Zio 09/2022: 19.1% PVCs, Avg HR 63  TTE 08/2022: EF 55-60   Syncope and collapse    Past Surgical History:  Procedure Laterality Date   ANKLE SURGERY     x 2   COLONOSCOPY  April 2013    CORONARY ANGIOPLASTY     CORONARY ARTERY BYPASS GRAFT  2010   FOOT SURGERY     HERNIA REPAIR  01/23/12   umb hernia   Kidney stimulator     LEFT HEART CATH AND CORS/GRAFTS ANGIOGRAPHY N/A 06/02/2016   Procedure: Left Heart Cath and Cors/Grafts Angiography;  Surgeon: Runell Gess, MD;  Location: [facility] INVASIVE CV LAB;  Service: Cardiovascular;  Laterality: N/A;   LEFT HEART CATHETERIZATION WITH CORONARY/GRAFT ANGIOGRAM N/A 07/08/2012   Procedure: LEFT HEART CATHETERIZATION WITH Isabel Caprice;  Surgeon: Vesta Mixer, MD;  Location: [facility] CATH LAB;  Service: Cardiovascular;  Laterality: N/A;   PR VEIN BYPASS GRAFT,AORTO-FEM-POP     STERNAL WIRES REMOVAL  07/08/2011   Procedure: STERNAL WIRES REMOVAL;  Surgeon: [name], MD;  Location: [facility] OR;  Service: Open Heart Surgery;  Laterality: N/A;  removal of 4th wire   Current Outpatient Medications on File Prior to Visit  Medication Sig Dispense Refill   amLODipine (NORVASC) 10 MG tablet TAKE 1 TABLET(10 MG) BY MOUTH DAILY 90 tablet 1   atorvastatin (LIPITOR) 80 MG tablet TAKE 1 TABLET BY MOUTH EVERY NIGHT AT BEDTIME 90 tablet 1   benzonatate (TESSALON) 100 MG capsule Take 1 capsule (100 mg total) by mouth every 8 (eight) hours. 21 capsule 0  clopidogrel (PLAVIX) 75 MG tablet TAKE 1 TABLET(75 MG) BY MOUTH DAILY 90 tablet 2   Evolocumab (REPATHA SURECLICK) 140 MG/ML SOAJ Inject 140 mg into the skin every 14 (fourteen) days. 6 mL 3   furosemide (LASIX) 20 MG tablet Take 1 tablet (20 mg total) by mouth every other day. 90 tablet 3   gabapentin (NEURONTIN) 800 MG tablet Take 800 mg by mouth 3 (three) times daily.     losartan (COZAAR) 100 MG tablet TAKE 1 TABLET(100 MG) BY MOUTH DAILY 90 tablet 3   naloxone (NARCAN) nasal spray 4 mg/0.1 mL Place 1 spray into the nose once.     oxyCODONE-acetaminophen (PERCOCET) 10-325 MG tablet Take 1 tablet by mouth every 6 (six) hours as needed.     predniSONE (DELTASONE) 20 MG tablet 3 tabs poqday  1-2, 2 tabs poqday 3-4, 1 tab poqday 5-6 12 tablet 0   sodium bicarbonate 650 MG tablet Take 1 tablet (650 mg total) by mouth daily. 30 tablet 1   No current facility-administered medications on file prior to visit.   Allergies  Allergen Reactions   Rosuvastatin     Muscle aches. Other reaction(s): leg pain, Other Muscle aches. Other reaction(s): leg pain Other reaction(s): leg pain    Social History   Socioeconomic History   Marital status: Widowed    Spouse name: Irving Burton   Number of children: 2   Years of education: Not on file   Highest education level: Not on file  Occupational History   Occupation: Disability  Tobacco Use   Smoking status: Former    Current packs/day: 0.00    Average packs/day: 3.0 packs/day for 30.0 years (90.0 ttl pk-yrs)    Types: Cigarettes, Cigars    Start date: 01/27/1979    Quit date: 01/26/2009    Years since quitting: 14.4    Passive exposure: Past   Smokeless tobacco: Never  Vaping Use   Vaping status: Never Used  Substance and Sexual Activity   Alcohol use: No   Drug use: No   Sexual activity: Not Currently  Other Topics Concern   Not on file  Social History Narrative   Married x 37 years. Irving Burton)    Social Drivers of Health   Financial Resource Strain: Low Risk  (05/14/2023)   Overall Financial Resource Strain (CARDIA)    Difficulty of Paying Living Expenses: Not hard at all  Food Insecurity: No Food Insecurity (05/14/2023)   Hunger Vital Sign    Worried About Running Out of Food in the Last Year: Never true    Ran Out of Food in the Last Year: Never true  Transportation Needs: No Transportation Needs (05/14/2023)   PRAPARE - Administrator, Civil Service (Medical): No    Lack of Transportation (Non-Medical): No  Physical Activity: Insufficiently Active (05/14/2023)   Exercise Vital Sign    Days of Exercise per Week: 3 days    Minutes of Exercise per Session: 20 min  Stress: No Stress Concern Present (05/14/2023)    [employer] of Occupational Health - Occupational Stress Questionnaire    Feeling of Stress : Not at all  Social Connections: Socially Isolated (05/14/2023)   Social Connection and Isolation Panel [NHANES]    Frequency of Communication with Friends and Family: Three times a week    Frequency of Social Gatherings with Friends and Family: Never    Attends Religious Services: Never    Database administrator or Organizations: No    Attends [employer]  or Organization Meetings: Never    Marital Status: Widowed  Intimate Partner Violence: Not At Risk (05/14/2023)   Humiliation, Afraid, Rape, and Kick questionnaire    Fear of Current or Ex-Partner: No    Emotionally Abused: No    Physically Abused: No    Sexually Abused: No      Review of Systems  All other systems reviewed and are negative.      Objective:   Physical Exam        Assessment & Plan:  Controlled type 2 diabetes mellitus with complication, without long-term current use of insulin (HCC) - Plan: Hemoglobin A1c, COMPLETE METABOLIC PANEL WITH GFR, Lipid panel, CBC with Differential/Platelet, Microalbumin/Creatinine Ratio, Urine  Essential hypertension - Plan: Hemoglobin A1c, COMPLETE METABOLIC PANEL WITH GFR, Lipid panel, CBC with Differential/Platelet  Hx of Inferior MI - Plan: Hemoglobin A1c, COMPLETE METABOLIC PANEL WITH GFR, Lipid panel, CBC with Differential/Platelet I am unable to perform a physical exam today as this is a telephone visit.  His blood pressure at his cardiologist office was elevated.  However his home blood pressure is well-controlled per his report.  I explained the patient that I want to see his blood pressure less than 140 systolic and less than 90 diastolic.  I have asked the patient to come by fasting so that we can check an A1c.  I would like to see his hemoglobin A1c less than 6.5.  I would also like a fasting lipid panel.  His LDL cholesterol goal would be less than 55 given his history of coronary  artery disease.  I will also check a urine protein creatinine ratio to evaluate for diabetic nephropathy.  Symptoms sound like he is dealing with neuropathic pain in his feet however the pain has resolved.  He is already on gabapentin.  Therefore no further workup is necessary if the patient is not experiencing any pain.  Patient states that his cardiologist checked the pulses in his feet this morning and told him that he had good peripheral pulses.

## 2023-07-14 LAB — COMPREHENSIVE METABOLIC PANEL
AG Ratio: 1.4 (calc) (ref 1.0–2.5)
ALT: 21 U/L (ref 9–46)
AST: 21 U/L (ref 10–35)
Albumin: 3.9 g/dL (ref 3.6–5.1)
Alkaline phosphatase (APISO): 93 U/L (ref 35–144)
BUN/Creatinine Ratio: 10 (calc) (ref 6–22)
BUN: 20 mg/dL (ref 7–25)
CO2: 28 mmol/L (ref 20–32)
Calcium: 9.2 mg/dL (ref 8.6–10.3)
Chloride: 105 mmol/L (ref 98–110)
Creat: 2.05 mg/dL — ABNORMAL HIGH (ref 0.70–1.35)
Globulin: 2.7 g/dL (ref 1.9–3.7)
Glucose, Bld: 145 mg/dL — ABNORMAL HIGH (ref 65–99)
Potassium: 4.6 mmol/L (ref 3.5–5.3)
Sodium: 142 mmol/L (ref 135–146)
Total Bilirubin: 1.1 mg/dL (ref 0.2–1.2)
Total Protein: 6.6 g/dL (ref 6.1–8.1)

## 2023-07-14 LAB — LIPID PANEL
Cholesterol: 90 mg/dL (ref <200)
HDL: 43 mg/dL (ref >=40)
LDL Cholesterol (Calc): 27 mg/dL
Non-HDL Cholesterol (Calc): 47 mg/dL (ref <130)
Total CHOL/HDL Ratio: 2.1 (calc) (ref <5.0)
Triglycerides: 118 mg/dL (ref <150)

## 2023-07-14 LAB — CBC WITH DIFFERENTIAL/PLATELET
Absolute Lymphocytes: 1203 {cells}/uL (ref 850–3900)
Absolute Monocytes: 450 {cells}/uL (ref 200–950)
Basophils Absolute: 29 {cells}/uL (ref 0–200)
Basophils Relative: 0.5 %
Eosinophils Absolute: 131 {cells}/uL (ref 15–500)
Eosinophils Relative: 2.3 %
HCT: 42.9 % (ref 38.5–50.0)
Hemoglobin: 13.6 g/dL (ref 13.2–17.1)
MCH: 27 pg (ref 27.0–33.0)
MCHC: 31.7 g/dL — ABNORMAL LOW (ref 32.0–36.0)
MCV: 85.1 fL (ref 80.0–100.0)
MPV: 11.4 fL (ref 7.5–12.5)
Monocytes Relative: 7.9 %
Neutro Abs: 3887 {cells}/uL (ref 1500–7800)
Neutrophils Relative %: 68.2 %
Platelets: 152 10*3/uL (ref 140–400)
RBC: 5.04 10*6/uL (ref 4.20–5.80)
RDW: 12.5 % (ref 11.0–15.0)
Total Lymphocyte: 21.1 %
WBC: 5.7 10*3/uL (ref 3.8–10.8)

## 2023-07-14 LAB — MICROALBUMIN / CREATININE URINE RATIO
Creatinine, Urine: 167 mg/dL (ref 20–320)
Microalb Creat Ratio: 137 mg/g{creat} — ABNORMAL HIGH (ref <30)
Microalb, Ur: 22.9 mg/dL

## 2023-07-14 LAB — HEMOGLOBIN A1C
Hgb A1c MFr Bld: 7.7 %{Hb} — ABNORMAL HIGH (ref <5.7)
Mean Plasma Glucose: 174 mg/dL
eAG (mmol/L): 9.7 mmol/L

## 2023-07-21 ENCOUNTER — Other Ambulatory Visit

## 2023-07-22 ENCOUNTER — Encounter

## 2023-07-29 ENCOUNTER — Ambulatory Visit
Admission: RE | Admit: 2023-07-29 | Discharge: 2023-07-29 | Disposition: A | Discharge status: Home / Self Care | Source: Ambulatory Visit | Attending: Family Medicine | Admitting: Family Medicine

## 2023-07-29 DIAGNOSIS — D3501 Benign neoplasm of right adrenal gland: Secondary | ICD-10-CM

## 2023-07-29 DIAGNOSIS — I700 Atherosclerosis of aorta: Secondary | ICD-10-CM | POA: Diagnosis not present

## 2023-08-03 DIAGNOSIS — M79672 Pain in left foot: Secondary | ICD-10-CM | POA: Diagnosis not present

## 2023-08-03 DIAGNOSIS — G8921 Chronic pain due to trauma: Secondary | ICD-10-CM | POA: Diagnosis not present

## 2023-08-03 DIAGNOSIS — M25572 Pain in left ankle and joints of left foot: Secondary | ICD-10-CM | POA: Diagnosis not present

## 2023-08-10 ENCOUNTER — Ambulatory Visit: Payer: Self-pay

## 2023-08-11 ENCOUNTER — Other Ambulatory Visit: Payer: Self-pay

## 2023-08-11 DIAGNOSIS — J441 Chronic obstructive pulmonary disease with (acute) exacerbation: Secondary | ICD-10-CM

## 2023-08-11 DIAGNOSIS — E118 Type 2 diabetes mellitus with unspecified complications: Secondary | ICD-10-CM

## 2023-08-11 MED ORDER — ALBUTEROL SULFATE HFA 108 (90 BASE) MCG/ACT IN AERS: 2.0000 | INHALATION_SPRAY | RESPIRATORY_TRACT | 3 refills | Status: DC | PRN

## 2023-08-11 MED ORDER — DAPAGLIFLOZIN PROPANEDIOL 10 MG PO TABS: 10.0000 mg | ORAL_TABLET | Freq: Every day | ORAL | 3 refills | Status: DC

## 2023-08-11 NOTE — Patient Instructions (Signed)
 Visit Information  Thank you for taking time to visit with me today. Please don't hesitate to contact me if I can be of assistance to you before our next scheduled appointment.  Our next appointment is by telephone on 09/07/23 at 09:00 AM Please call the care guide team at [phone] if you need to cancel or reschedule your appointment.   Following is a copy of your care plan:   Goals Addressed               This Visit's Progress     Patient Stated     COMPLETED: manage COPD/upper respiratory symptoms, improve mobility (VBCI RN CM) (pt-stated)        See new goal      Other     VBCI RN Care Plan related to COPD        Problems:  Care Coordination needs related to Depression   Chronic Disease Management support and education needs related to COPD  Goal: Over the next 30 days the Patient will demonstrate understanding of rationale for each prescribed medication as evidenced by patient will repeat back proper technique on use of his inhaler/inhalers for management of COPD and report 100% adherence      Interventions:   COPD Interventions: Provided patient with basic written and verbal COPD education on self care/management/and exacerbation prevention Advised patient to track and manage COPD triggers Provided instruction about proper use of medications used for management of COPD including inhalers Collaborated with PCP CMA Mendel Ryder [employer] LPN regarding need for a refill for Albuterol  Patient Self-Care Activities:  Attend all scheduled provider appointments Call pharmacy for medication refills 3-7 days in advance of running out of medications Call provider office for new concerns or questions  Take medications as prescribed   develop a rescue plan  Plan:  Telephone follow up appointment with care management team member scheduled for:  09/07/23 @09 :00 AM       VBCI RN Care Plan related to Grief        Problems:  Care Coordination needs related to Depression    Chronic Disease Management support and education needs related to Depression/Grief  Goal: Over the next 60 days the Patient will continue to work with Medical illustrator and/or Social Worker to address care management and care coordination needs related to Depression as evidenced by adherence to CM Team Scheduled appointments      Interventions:   Evaluation of current treatment plan related to Depression, self-management and patient's adherence to plan as established by provider Discussed with patient he recently lost his wife late last year following an illness Determined patient has been grieving and not caring for himself as he use to Motivational Interviewing employed Active listening / Reflection utilized  Industrial/product designer Provided  Educated patient about the availability of VBCI LCSW for grief counseling, patient declines at this time  Encouraged patient to use diversional activities, such as prayer, and meditation when feeling down or sad Determined patient's daughter is a Engineer, civil (consulting) and lives in [place], she will be driving into [place] to check on him weekly Instructed patient to keep his doctor notified of new or worsening symptoms  Discussed plans with patient for ongoing care management follow up and provided patient with direct contact information for care management team Sent in basket message to Dr. Tanya Nones with a patient update    Patient Self-Care Activities:  Attend all scheduled provider appointments Call pharmacy for medication refills 3-7 days in advance of running  out of medications Call provider office for new concerns or questions  Take medications as prescribed    Plan:  Telephone follow up appointment with care management team member scheduled for:  09/07/23 @09 :00 AM        VBCI RN Care Plan related to uncontrolled type 2 diabetes        Problems:  Care Coordination needs related to Depression   Chronic Disease Management support and education needs  related to DMII  Goal: Over the next 30-45 days the Patient will continue to work with Medical illustrator and/or Social Worker to address care management and care coordination needs related to DMII as evidenced by adherence to CM Team Scheduled appointments      Interventions:   Diabetes Interventions: Assessed patient's understanding of A1c goal: <7% Provided education to patient about basic DM disease process Reviewed medications with patient and discussed importance of medication adherence Provided patient with written educational materials related to hypo and hyperglycemia and importance of correct treatment Advised patient, providing education and rationale, to check cbg daily before meals and at bedtime and record, calling PCP for findings outside established parameters Review of patient status, including review of consultants reports, relevant laboratory and other test results, and medications completed Assessed social determinant of health barriers Lab Results  Component Value Date   HGBA1C 7.7 (H) 07/13/2023    Patient Self-Care Activities:  Attend all scheduled provider appointments Call pharmacy for medication refills 3-7 days in advance of running out of medications Call provider office for new concerns or questions  Take medications exactly as prescribed  Let your doctor know if you experience SE to [facility] check blood sugar at prescribed times: before meals and at bedtime check feet daily for cuts, sores or redness enter blood sugar readings and medication or insulin into daily log take the blood sugar log to all doctor visits drink 6 to 8 glasses of water each day fill half of plate with vegetables manage portion size  Plan:  Telephone follow up appointment with care management team member scheduled for:  09/07/23 @09 :00 AM        Please call 1-800-273-TALK (toll free, 24 hour hotline) if you are experiencing a Mental Health or Behavioral Health Crisis or need  someone to talk to.  Patient verbalizes understanding of instructions and care plan provided today and agrees to view in MyChart. Active MyChart status and patient understanding of how to access instructions and care plan via MyChart confirmed with patient.     Louanne Roussel RN BSN CCM [place]  [facility], [facility] Nurse Care Coordinator  Direct Dial: [phone] Website: [email or web address]

## 2023-08-11 NOTE — Patient Outreach (Signed)
 Complex Care Management   Visit Note  08/11/2023  Name:  [name] MRN: [id number] DOB: 08/18/1960  Situation: Referral received for Complex Care Management related to Diabetes with Complications and depression related to Grief   I obtained verbal consent from Patient.  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   Chest pain    COPD (chronic obstructive pulmonary disease) (HCC)    Coronary artery disease    STATUS POST CABG 01/2009   Dyslipidemia    Hyperlipidemia    Hypertension    Orthostatic hypotension    Peripheral vascular disease (HCC)    PVC's (premature ventricular contractions) 10/01/2022   Zio 09/2022: 19.1% PVCs, Avg HR 63  TTE 08/2022: EF 55-60   Syncope and collapse     Assessment: Patient Reported Symptoms:  Cognitive Cognitive Status: Alert and oriented to person, place, and time   Health Maintenance Behaviors: Annual physical exam, Sleep adequate Health Facilitated by: Rest, Pain control  Neurological  Neurological Symptoms Reported: No symptoms reported     HEENT HEENT Symptoms Reported: No symptoms reported      Cardiovascular Cardiovascular Symptoms Reported: No symptoms reported Does patient have uncontrolled Hypertension?: No Weight: 250 lb (113.4 kg) (patient reported)  Respiratory Respiratory Symptoms Reported: Shortness of breath Respiratory Conditions: COPD Respiratory Self-Management Outcome: 4 (good) Respiratory Comment: patient needs Rx for Albuterol, message sent to PCP provider  Endocrine Patient reports the following symptoms related to hypoglycemia or hyperglycemia : No symptoms reported Is patient  diabetic?: Yes Is patient checking blood sugars at home?: No Endocrine Conditions: Diabetes Endocrine Management Strategies: Routine screening Endocrine Self-Management Outcome: 3 (uncertain)  Gastrointestinal Gastrointestinal Symptoms Reported: No symptoms reported   Nutrition Risk Screen (CP): No indicators present  Genitourinary    Genitourinary Symptoms Reported: No symptoms reported   Integumentary Integumentary Symptoms Reported: Bruising Skin Self-Management Outcome: 3 (uncertain)  Musculoskeletal Musculoskelatal Symptoms Reviewed: Difficulty walking, Muscle pain, Unsteady gait Musculoskeletal Conditions: Unsteady gait Musculoskeletal Management Strategies: Routine screening, Medication therapy Musculoskeletal Self-Management Outcome: 4 (good) Falls in the past year?: No Number of falls in past year: 1 or less Was there an injury with Fall?: No Fall Risk Category Calculator: 0 Patient Fall Risk Level: Low Fall Risk Patient at Risk for Falls Due to: Impaired balance/gait Fall risk Follow up: Education provided  Psychosocial Psychosocial Symptoms Reported: Depression - if selected complete PHQ 2-9 Additional Psychological Details: recent loss of wife Behavioral Management Strategies: Activity, Coping strategies, Support system Behavioral Health Self-Management Outcome: 4 (good) Major Change/Loss/Stressor/Fears (CP): Death of a loved one Behaviors When Feeling Stressed/Fearful: loss of wife Techniques to [place] with Loss/Stress/Change: Diversional activities, Support group Quality of Family Relationships: supportive Do you feel physically threatened by others?: No      08/10/2023   12:05 PM  Depression screen PHQ 2/9  Decreased Interest 3  Down, Depressed, Hopeless 3  PHQ - 2 Score 6  Altered sleeping 0  Tired, decreased energy 3  Change in appetite 1  Feeling bad or failure about yourself  1  PHQ-9 Score 11  Difficult doing work/chores Not difficult at all    There  were no vitals  filed for this visit.  Medications Reviewed Today     Reviewed by Kaylene Pascal, RN (Registered Nurse) on 08/10/23 at 1156  Med List Status: <None>   Medication Order Taking? Sig Documenting Provider Last Dose Status Informant  acetaminophen (TYLENOL) 500 MG tablet [id number] Yes Take 500 mg by mouth every 6 (six) hours as needed. [provider] Taking Active   amLODipine (NORVASC) 10 MG tablet [id number]  TAKE 1 TABLET(10 MG) BY MOUTH DAILY Nahser, Lela Purple, MD  Active   atorvastatin (LIPITOR) 80 MG tablet [id number] Yes TAKE 1 TABLET BY MOUTH EVERY NIGHT AT BEDTIME Austine Lefort, MD Taking Active   benzonatate (TESSALON) 100 MG capsule [id number]  Take 1 capsule (100 mg total) by mouth every 8 (eight) hours. Rosealee Concha, MD  Consider Medication Status and Discontinue (Completed Course)   clopidogrel (PLAVIX) 75 MG tablet [id number] Yes TAKE 1 TABLET(75 MG) BY MOUTH DAILY Nahser, Lela Purple, MD Taking Active   Evolocumab [facility] SURECLICK) 140 MG/ML [place] [id number] Yes Inject 140 mg into the skin every 14 (fourteen) days. Nahser, Lela Purple, MD Taking Active   furosemide (LASIX) 20 MG tablet [id number] No Take 1 tablet (20 mg total) by mouth every other day.  Patient not taking: Reported on 08/10/2023   Marlyse Single T, PA-C Not Taking Expired 01/06/23 2359   gabapentin (NEURONTIN) 800 MG tablet [id number] Yes Take 800 mg by mouth 3 (three) times daily. [provider] Taking Active Self, Pharmacy Records  losartan (COZAAR) 100 MG tablet [id number] Yes TAKE 1 TABLET(100 MG) BY MOUTH DAILY Nahser, Lela Purple, MD Taking Active   naloxone [facility]) nasal spray 4 mg/0.1 mL [id number] Yes Place 1 spray into the nose once. [provider] Taking Active   oxyCODONE-acetaminophen (PERCOCET) 10-325 MG tablet [id number] Yes Take 1 tablet by mouth every 6 (six) hours as needed. [provider] Taking Active Self, Pharmacy Records  predniSONE (DELTASONE) 20 MG  tablet [id number]  3 tabs poqday 1-2, 2 tabs poqday 3-4, 1 tab poqday 5-6 Austine Lefort, MD  Consider Medication Status and Discontinue (Completed Course)   sodium bicarbonate 650 MG tablet [id number]  Take 1 tablet (650 mg total) by mouth daily. Colin Dawley, MD  Consider Medication Status and Discontinue (Completed Course)             Recommendation:   PCP Follow-up  Follow Up Plan:   Telephone follow up appointment date/time:  09/07/23 @09 :00 AM  Louanne Roussel RN BSN CCM [place]  [facility], [facility] Nurse Care Coordinator  Direct Dial: [phone] Website: [email or web address]

## 2023-08-25 ENCOUNTER — Ambulatory Visit: Payer: Self-pay

## 2023-08-25 NOTE — Telephone Encounter (Signed)
 Copied from CRM [id number]. Topic: Clinical - Medical Advice >> Aug 25, 2023 11:59 AM Jyl Or S wrote: Reason for CRM: Patient asked if Dr Cheril Cork could call in a prescription for his coughing please follow up with patient

## 2023-08-25 NOTE — Telephone Encounter (Signed)
  Chief Complaint: Cough -Productive  Symptoms: Sore Throat Frequency: A couple of days  Pertinent Negatives: Patient denies fever, chest pain, dyspnea  Disposition: [] ED /[] Urgent Care (no appt availability in office) / [x] Appointment(In office/virtual)/ []  [facility]/ [] Home Care/ [] Refused Recommended Disposition /[] [facility]/ []  Follow-up with PCP  Additional Notes: [name] is being triaged for a productive cough and a sore throat. The patient reports having yellow sputum when he coughs.   Patient has tested for COVID, negative result.  Reason for Disposition  [1] Known COPD or other severe lung disease (i.e., bronchiectasis, cystic fibrosis, lung surgery) AND [2] worsening symptoms (i.e., increased sputum purulence or amount, increased breathing difficulty  Answer Assessment - Initial Assessment Questions 1. ONSET: "When did the cough begin?"      3-4  Days ago  2. SEVERITY: "How bad is the cough today?"      Consistent  3. SPUTUM: "Describe the color of your sputum" (none, dry cough; clear, white, yellow, green)     Yellow  4. HEMOPTYSIS: "Are you coughing up any blood?" If so ask: "How much?" (flecks, streaks, tablespoons, etc.)     No  5. DIFFICULTY BREATHING: "Are you having difficulty breathing?" If Yes, ask: "How bad is it?" (e.g., mild, moderate, severe)    - MILD: No SOB at rest, mild SOB with walking, speaks normally in sentences, can lie down, no retractions, pulse < 100.    - MODERATE: SOB at rest, SOB with minimal exertion and prefers to sit, cannot lie down flat, speaks in phrases, mild retractions, audible wheezing, pulse 100-120.    - SEVERE: Very SOB at rest, speaks in single words, struggling to breathe, sitting hunched forward, retractions, pulse > 120      Mild  6. FEVER: "Do you have a fever?" If Yes, ask: "What is your temperature, how was it measured, and when did it start?"     No  7. CARDIAC HISTORY: "Do you have any history of  heart disease?" (e.g., heart attack, congestive heart failure)      No  8. LUNG HISTORY: "Do you have any history of lung disease?"  (e.g., pulmonary embolus, asthma, emphysema)     COPD  9. PE RISK FACTORS: "Do you have a history of blood clots?" (or: recent major surgery, recent prolonged travel, bedridden)     No  10. OTHER SYMPTOMS: "Do you have any other symptoms?" (e.g., runny nose, wheezing, chest pain)       Sore Throat, Nasal Congestion   12. TRAVEL: "Have you traveled out of the country in the last month?" (e.g., travel history, exposures)       No  Protocols used: Cough - Acute Productive-A-AH

## 2023-08-26 ENCOUNTER — Encounter: Payer: Self-pay | Admitting: Family Medicine

## 2023-08-26 ENCOUNTER — Ambulatory Visit (INDEPENDENT_AMBULATORY_CARE_PROVIDER_SITE_OTHER): Admitting: Family Medicine

## 2023-08-26 VITALS — BP 130/74 | HR 43 | Temp 98.3°F | Ht 69.0 in | Wt 250.1 lb

## 2023-08-26 DIAGNOSIS — I10 Essential (primary) hypertension: Secondary | ICD-10-CM | POA: Diagnosis not present

## 2023-08-26 DIAGNOSIS — J069 Acute upper respiratory infection, unspecified: Secondary | ICD-10-CM | POA: Diagnosis not present

## 2023-08-26 MED ORDER — AMLODIPINE BESYLATE 10 MG PO TABS: 10.0000 mg | ORAL_TABLET | Freq: Every day | ORAL | 1 refills | Status: DC

## 2023-08-26 MED ORDER — BENZONATATE 100 MG PO CAPS: 100.0000 mg | ORAL_CAPSULE | Freq: Three times a day (TID) | ORAL | 0 refills | Status: DC

## 2023-08-26 MED ORDER — FLUTICASONE PROPIONATE 50 MCG/ACT NA SUSP: 2.0000 | Freq: Every day | NASAL | 6 refills | Status: DC

## 2023-08-26 NOTE — Progress Notes (Signed)
 Subjective:  HPI: [name] is a 63 y.o. male presenting on 08/26/2023 for Cough (Sxs for about a week. Has not tried OTC ), Sore Throat, and Nasal Congestion   Cough  Sore Throat  Associated symptoms include coughing.   Patient is in today for 5 days sore throat, nasal congestion, mucopurulent cough, SOB, subjective fever, mucopurulent rhinorrhea Denies chills, body aches, ear pain, sinus pressure, pleurisy No covid or flu test, known sick exposures Has tried Tylenol , Albuterol  with relief  Review of Systems  Respiratory:  Positive for cough.   All other systems reviewed and are negative.   Relevant past medical history reviewed and updated as indicated.   Past Medical History:  Diagnosis Date   (HFpEF) heart failure with preserved ejection fraction (HCC) 10/23/2022   TTE 09/12/2022: EF 55-60, no RWMA, mild LVH, mildly reduced RVSF, normal PASP, severe LAE, moderate RAE, moderate MAC, mild AV calcification, ascending aorta 39 mm, RAP 3   Acute MI, 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   Chest pain    COPD (chronic obstructive pulmonary disease) (HCC)    Coronary artery disease    STATUS POST CABG 01/2009   Dyslipidemia    Hyperlipidemia    Hypertension    Orthostatic hypotension    Peripheral vascular disease (HCC)    PVC's (premature ventricular contractions) 10/01/2022   Zio 09/2022: 19.1% PVCs, Avg HR 63  TTE 08/2022: EF 55-60   Syncope and collapse      Past Surgical History:  Procedure Laterality Date   ANKLE SURGERY     x 2   COLONOSCOPY  April 2013   CORONARY ANGIOPLASTY     CORONARY ARTERY BYPASS GRAFT  2010   FOOT SURGERY     HERNIA REPAIR  01/23/12   umb hernia   Kidney stimulator     LEFT HEART CATH AND CORS/GRAFTS ANGIOGRAPHY N/A 06/02/2016   Procedure: Left Heart Cath and Cors/Grafts Angiography;  Surgeon: Avanell Leigh, MD;  Location: [facility] INVASIVE CV LAB;  Service: Cardiovascular;  Laterality: N/A;   LEFT HEART CATHETERIZATION WITH CORONARY/GRAFT  ANGIOGRAM N/A 07/08/2012   Procedure: LEFT HEART CATHETERIZATION WITH Estella Helling;  Surgeon: Lake Pilgrim, MD;  Location: [facility] LAB;  Service: Cardiovascular;  Laterality: N/A;   PR VEIN BYPASS GRAFT,AORTO-FEM-POP     STERNAL WIRES REMOVAL  07/08/2011   Procedure: STERNAL WIRES REMOVAL;  Surgeon: Norita Beauvais, MD;  Location: [facility] OR;  Service: Open Heart Surgery;  Laterality: N/A;  removal of 4th wire    Allergies and medications reviewed and updated.   Current Outpatient Medications:    acetaminophen  (TYLENOL ) 500 MG tablet, Take 500 mg by mouth every 6 (six) hours as needed., Disp: , Rfl:    albuterol  (VENTOLIN  HFA) 108 (90 Base) MCG/ACT inhaler, Inhale 2 puffs into the lungs every 4 (four) hours as needed for wheezing or shortness of breath., Disp: 8 g, Rfl: 3   atorvastatin  (LIPITOR ) 80 MG tablet, TAKE 1 TABLET BY MOUTH EVERY NIGHT AT BEDTIME, Disp: 90 tablet, Rfl: 1   clopidogrel  (PLAVIX ) 75 MG tablet, TAKE 1 TABLET(75 MG) BY MOUTH DAILY, Disp: 90 tablet, Rfl: 2   dapagliflozin  propanediol (FARXIGA ) 10 MG TABS tablet, Take 1 tablet (10 mg total) by mouth daily before breakfast., Disp: 30 tablet, Rfl: 3   Evolocumab  (REPATHA  SURECLICK) 140 MG/ML SOAJ, Inject 140 mg into the skin every 14 (fourteen) days., Disp: 6 mL, Rfl: 3   fluticasone (FLONASE) 50 MCG/ACT nasal spray, Place 2 sprays  into both nostrils daily., Disp: 16 g, Rfl: 6   gabapentin  (NEURONTIN ) 800 MG tablet, Take 800 mg by mouth 3 (three) times daily., Disp: , Rfl:    losartan  (COZAAR ) 100 MG tablet, TAKE 1 TABLET(100 MG) BY MOUTH DAILY, Disp: 90 tablet, Rfl: 3   naloxone (NARCAN) nasal spray 4 mg/0.1 mL, Place 1 spray into the nose once., Disp: , Rfl:    oxyCODONE -acetaminophen  (PERCOCET) 10-325 MG tablet, Take 1 tablet by mouth every 6 (six) hours as needed., Disp: , Rfl:    predniSONE  (DELTASONE ) 20 MG tablet, 3 tabs poqday 1-2, 2 tabs poqday 3-4, 1 tab poqday 5-6, Disp: 12 tablet, Rfl: 0   sodium  bicarbonate 650 MG tablet, Take 1 tablet (650 mg total) by mouth daily., Disp: 30 tablet, Rfl: 1   amLODipine  (NORVASC ) 10 MG tablet, Take 1 tablet (10 mg total) by mouth daily., Disp: 90 tablet, Rfl: 1   benzonatate  (TESSALON ) 100 MG capsule, Take 1 capsule (100 mg total) by mouth every 8 (eight) hours., Disp: 21 capsule, Rfl: 0   furosemide  (LASIX ) 20 MG tablet, Take 1 tablet (20 mg total) by mouth every other day. (Patient not taking: Reported on 08/10/2023), Disp: 90 tablet, Rfl: 3  Allergies  Allergen Reactions   Rosuvastatin      Muscle aches. Other reaction(s): leg pain, Other Muscle aches. Other reaction(s): leg pain Other reaction(s): leg pain     Objective:   BP 130/74   Pulse (!) 43   Temp 98.3 F (36.8 C) (Oral)   Ht 5\' 9"  (1.753 m)   Wt 250 lb 2 oz (113.5 kg)   SpO2 97%   BMI 36.94 kg/m      08/26/2023    8:49 AM 08/10/2023   11:45 AM 07/13/2023    9:51 AM  Vitals with BMI  Height 5\' 9"     Weight 250 lbs 2 oz 250 lbs   BMI 36.92    Systolic 130  145  Diastolic 74  85  Pulse 43       Physical Exam Vitals and nursing note reviewed.  Constitutional:      Appearance: Normal appearance. He is normal weight.  HENT:     Head: Normocephalic and atraumatic.     Right Ear: Tympanic membrane, ear canal and external ear normal.     Left Ear: Tympanic membrane, ear canal and external ear normal.     Nose: Congestion present.     Right Sinus: No maxillary sinus tenderness or frontal sinus tenderness.     Left Sinus: No maxillary sinus tenderness or frontal sinus tenderness.     Mouth/Throat:     Mouth: Mucous membranes are moist.     Pharynx: Oropharynx is clear.  Eyes:     Extraocular Movements: Extraocular movements intact.     Conjunctiva/sclera: Conjunctivae normal.     Pupils: Pupils are equal, round, and reactive to light.  Cardiovascular:     Rate and Rhythm: Normal rate and regular rhythm.     Pulses: Normal pulses.     Heart sounds: Normal heart  sounds.  Pulmonary:     Effort: Pulmonary effort is normal.     Breath sounds: Normal breath sounds.  Musculoskeletal:     Cervical back: No tenderness.  Lymphadenopathy:     Cervical: No cervical adenopathy.  Skin:    General: Skin is warm and dry.     Capillary Refill: Capillary refill takes less than 2 seconds.  Neurological:     General:  No focal deficit present.     Mental Status: He is alert and oriented to person, place, and time. Mental status is at baseline.  Psychiatric:        Mood and Affect: Mood normal.        Behavior: Behavior normal.        Thought Content: Thought content normal.        Judgment: Judgment normal.     Assessment & Plan:  Viral URI with cough Assessment & Plan: Reassured patient that symptoms and exam findings are most consistent with a viral upper respiratory infection and explained lack of efficacy of antibiotics against viruses.  Discussed expected course and features suggestive of secondary bacterial infection.  Continue supportive care. Increase fluid intake with water or electrolyte solution like pedialyte. Encouraged acetaminophen  as needed for fever/pain. Encouraged salt water gargling, chloraseptic spray and throat lozenges. Encouraged OTC guaifenesin . Encouraged saline sinus flushes and/or neti with humidified air.     Essential hypertension Assessment & Plan: Ran out of amlodipine  today, refill provided  Orders: -     amLODIPine  Besylate; Take 1 tablet (10 mg total) by mouth daily.  Dispense: 90 tablet; Refill: 1  Other orders -     Fluticasone Propionate; Place 2 sprays into both nostrils daily.  Dispense: 16 g; Refill: 6 -     Benzonatate ; Take 1 capsule (100 mg total) by mouth every 8 (eight) hours.  Dispense: 21 capsule; Refill: 0     Follow up plan: Return if symptoms worsen or fail to improve.  Jenelle Mis, FNP

## 2023-08-26 NOTE — Assessment & Plan Note (Signed)
 Ran out of amlodipine  today, refill provided

## 2023-08-26 NOTE — Assessment & Plan Note (Signed)

## 2023-08-31 ENCOUNTER — Other Ambulatory Visit: Payer: Self-pay | Admitting: Family Medicine

## 2023-08-31 ENCOUNTER — Telehealth: Payer: Self-pay

## 2023-08-31 MED ORDER — AMOXICILLIN 875 MG PO TABS: 875.0000 mg | ORAL_TABLET | Freq: Two times a day (BID) | ORAL | 0 refills | Status: AC

## 2023-08-31 NOTE — Telephone Encounter (Signed)
 Copied from CRM [id number]. Topic: Clinical - Medication Question >> Aug 31, 2023  7:56 AM [name] wrote: Reason for CRM: Patient is calling with complaints of a sore throat. Patient stated that his throat is sore to the point of being unable to eat. Patient is requesting that Dr. Cheril Cork prescribe him a medication. In addition, patient stated that the provider that initially seen him for the his concerns did not do anything but prescribe him so nasal spray. Please contact patient for additional information at [phone]

## 2023-09-07 ENCOUNTER — Other Ambulatory Visit: Payer: Self-pay

## 2023-09-07 NOTE — Patient Instructions (Signed)
 Visit Information  Thank you for taking time to visit with me today. Please don't hesitate to contact me if I can be of assistance to you before our next scheduled appointment.  Your next care management appointment is by telephone on Tuesday, June 10 at 10:30 AM  Please call the care guide team at [phone] if you need to cancel, schedule, or reschedule an appointment.   Please call 1-800-273-TALK (toll free, 24 hour hotline) if you are experiencing a Mental Health or Behavioral Health Crisis or need someone to talk to.  Louanne Roussel RN BSN CCM [place]  [facility], [facility] Health Nurse Care Coordinator  Direct Dial: [phone] Website: [email or web address]

## 2023-09-07 NOTE — Patient Outreach (Signed)
 Complex Care Management   Visit Note  09/07/2023  Name:  [name] MRN: [id number] DOB: [birth year]  Situation: Referral received for Complex Care Management related to COPD and Depression. I obtained verbal consent from Patient.  Visit completed with patient  on the phone  Background:   Past Medical History:  Diagnosis Date   (HFpEF) heart failure with preserved ejection fraction (HCC) 10/23/2022   TTE 09/12/2022: EF 55-60, no RWMA, mild LVH, mildly reduced RVSF, normal PASP, severe LAE, moderate RAE, moderate MAC, mild AV calcification, ascending aorta 39 mm, RAP 3   Acute MI, 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   Chest pain    COPD (chronic obstructive pulmonary disease) (HCC)    Coronary artery disease    STATUS POST CABG 01/2009   Dyslipidemia    Hyperlipidemia    Hypertension    Orthostatic hypotension    Peripheral vascular disease (HCC)    PVC's (premature ventricular contractions) 10/01/2022   Zio 09/2022: 19.1% PVCs, Avg HR 63  TTE 08/2022: EF 55-60   Syncope and collapse     Assessment: Patient Reported Symptoms:  Cognitive Cognitive Status: Alert and oriented to person, place, and time Cognitive/Intellectual Conditions Management [RPT]: None reported or documented in medical history or problem list      Neurological Neurological Review of Symptoms: Not assessed    HEENT HEENT Symptoms Reported: Not assessed      Cardiovascular Cardiovascular Symptoms Reported: Not assessed    Respiratory Respiratory Symptoms Reported: Dry cough, Shortness of breath Additional Respiratory Details: Patient is currently being treated with an antibiotic for persistent cough and sore throat with symptoms improved since starting the antibiotic Respiratory Conditions: Cough, Shortness of breath, Seasonal allergies, COPD Respiratory Self-Management Outcome: 4 (good)  Endocrine Patient reports the following symptoms related to hypoglycemia or hyperglycemia : No symptoms reported Is patient  diabetic?: Yes Is patient checking blood sugars at home?: Yes Endocrine Conditions: Diabetes Endocrine Management Strategies: Medication therapy, Routine screening, Diet modification Endocrine Self-Management Outcome: 4 (good)  Gastrointestinal Gastrointestinal Symptoms Reported: Not assessed      Genitourinary Genitourinary Symptoms Reported: Not assessed    Integumentary Integumentary Symptoms Reported: Not assessed    Musculoskeletal Musculoskelatal Symptoms Reviewed: Not assessed        Psychosocial Psychosocial Symptoms Reported: Depression - if selected complete PHQ 2-9            09/07/2023   10:30 AM  Depression screen PHQ 2/9  Decreased Interest 1  Down, Depressed, Hopeless 1  PHQ - 2 Score 2    There were no vitals filed for this visit.  Medications Reviewed Today     Reviewed by Kaylene Pascal, RN (Registered Nurse) on 09/07/23 at 0911  Med List Status: <None>   Medication Order Taking? Sig Documenting Provider Last Dose Status Informant  acetaminophen  (TYLENOL ) 500 MG tablet [id number]  Take 500 mg by mouth every 6 (six) hours as needed. [provider]  Active   albuterol  (VENTOLIN  HFA) 108 (90 Base) MCG/ACT inhaler [id number] Yes Inhale 2 puffs into the lungs every 4 (four) hours as needed for wheezing or shortness of breath. Austine Lefort, MD Taking Active   amLODipine  (NORVASC ) 10 MG tablet [id number]  Take 1 tablet (10 mg total) by mouth daily. Jenelle Mis, FNP  Active   amoxicillin  (AMOXIL ) 875 MG tablet [id number] Yes Take 1 tablet (875 mg total) by mouth 2 (two) times daily for 7 days. Austine Lefort, MD Taking Active   atorvastatin  (LIPITOR )  80 MG tablet [id number]  TAKE 1 TABLET BY MOUTH EVERY NIGHT AT BEDTIME Austine Lefort, MD  Active   benzonatate  (TESSALON ) 100 MG capsule [id number] No Take 1 capsule (100 mg total) by mouth every 8 (eight) hours.  Patient not taking: Reported on 09/07/2023   Jenelle Mis, FNP Not Taking Active    clopidogrel  (PLAVIX ) 75 MG tablet [id number]  TAKE 1 TABLET(75 MG) BY MOUTH DAILY Nahser, Lela Purple, MD  Active   dapagliflozin  propanediol (FARXIGA ) 10 MG TABS tablet [id number]  Take 1 tablet (10 mg total) by mouth daily before breakfast. Austine Lefort, MD  Active   Evolocumab  (REPATHA  SURECLICK) 140 MG/ML SOAJ [id number]  Inject 140 mg into the skin every 14 (fourteen) days. Nahser, Lela Purple, MD  Active   fluticasone  (FLONASE ) 50 MCG/ACT nasal spray [id number] Yes Place 2 sprays into both nostrils daily. Jenelle Mis, FNP Taking Active   furosemide  (LASIX ) 20 MG tablet [id number]  Take 1 tablet (20 mg total) by mouth every other day.  Patient not taking: Reported on 08/10/2023   Marlyse Single T, PA-C  Expired 01/06/23 2359   gabapentin  (NEURONTIN ) 800 MG tablet [id number]  Take 800 mg by mouth 3 (three) times daily. [provider]  Active Self, Pharmacy Records  losartan  (COZAAR ) 100 MG tablet [id number]  TAKE 1 TABLET(100 MG) BY MOUTH DAILY Nahser, Lela Purple, MD  Active   naloxone [facility]) nasal spray 4 mg/0.1 mL [id number]  Place 1 spray into the nose once. [provider]  Active   oxyCODONE -acetaminophen  (PERCOCET) 10-325 MG tablet [id number]  Take 1 tablet by mouth every 6 (six) hours as needed. [provider]  Active Self, Pharmacy Records  predniSONE  (DELTASONE ) 20 MG tablet [id number]  3 tabs poqday 1-2, 2 tabs poqday 3-4, 1 tab poqday 5-6 Austine Lefort, MD  Consider Medication Status and Discontinue (Completed Course)   sodium bicarbonate  650 MG tablet [id number]  Take 1 tablet (650 mg total) by mouth daily. Colin Dawley, MD  Active             Recommendation:   PCP Follow-up  Follow Up Plan:   Telephone follow up appointment date/time:  Tuesday, June 10 at 10:30 AM  Louanne Roussel RN BSN CCM [place]  [facility], [facility] Health Nurse Care Coordinator  Direct Dial: [phone] Website: [email or web address]

## 2023-09-08 ENCOUNTER — Other Ambulatory Visit: Payer: Self-pay | Admitting: Cardiovascular Disease

## 2023-09-29 ENCOUNTER — Encounter (HOSPITAL_COMMUNITY): Payer: Self-pay

## 2023-10-05 DIAGNOSIS — M25572 Pain in left ankle and joints of left foot: Secondary | ICD-10-CM | POA: Diagnosis not present

## 2023-10-05 DIAGNOSIS — M79672 Pain in left foot: Secondary | ICD-10-CM | POA: Diagnosis not present

## 2023-10-05 DIAGNOSIS — G8921 Chronic pain due to trauma: Secondary | ICD-10-CM | POA: Diagnosis not present

## 2023-10-06 ENCOUNTER — Other Ambulatory Visit: Payer: Self-pay

## 2023-10-06 NOTE — Patient Outreach (Signed)
 Complex Care Management   Visit Note  10/06/2023  Name:  [name] MRN: [id number] DOB: 03-Sep-1960  Situation: Referral received for Complex Care Management related to COPD and Depression. I obtained verbal consent from Patient.  Visit completed with patient on the phone.   Background:   Past Medical History:  Diagnosis Date   (HFpEF) heart failure with preserved ejection fraction (HCC) 10/23/2022   TTE 09/12/2022: EF 55-60, no RWMA, mild LVH, mildly reduced RVSF, normal PASP, severe LAE, moderate RAE, moderate MAC, mild AV calcification, ascending aorta 39 mm, RAP 3   Acute MI, 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   Chest pain    COPD (chronic obstructive pulmonary disease) (HCC)    Coronary artery disease    STATUS POST CABG 01/2009   Dyslipidemia    Hyperlipidemia    Hypertension    Orthostatic hypotension    Peripheral vascular disease (HCC)    PVC's (premature ventricular contractions) 10/01/2022   Zio 09/2022: 19.1% PVCs, Avg HR 63  TTE 08/2022: EF 55-60   Syncope and collapse     Assessment: Patient Reported Symptoms:  Cognitive Cognitive Status: Alert and oriented to person, place, and time Cognitive/Intellectual Conditions Management [RPT]: None reported or documented in medical history or problem list   Health Maintenance Behaviors: Annual physical exam, Healthy diet, Sleep adequate Health Facilitated by: Pain control, Healthy diet, Rest  Neurological Neurological Review of Symptoms: No symptoms reported    HEENT HEENT Symptoms Reported: Not assessed      Cardiovascular Cardiovascular Symptoms Reported: No symptoms reported Does patient have uncontrolled Hypertension?: No Cardiovascular Conditions: Hypertension, Coronary artery disease (Peripheal Vascular disease) Cardiovascular Management Strategies: Medication therapy, Routine screening Cardiovascular Self-Management Outcome: 4 (good)  Respiratory Respiratory Symptoms Reported: No symptoms reported Other  Respiratory Symptoms: recent Upper Respiratory Virus resolved Respiratory Conditions: COPD Respiratory Self-Management Outcome: 4 (good)  Endocrine Patient reports the following symptoms related to hypoglycemia or hyperglycemia : Not assessed    Gastrointestinal Gastrointestinal Symptoms Reported: No symptoms reported      Genitourinary Genitourinary Symptoms Reported: Not assessed    Integumentary Integumentary Symptoms Reported: Not assessed    Musculoskeletal Musculoskelatal Symptoms Reviewed: No symptoms reported        Psychosocial Psychosocial Symptoms Reported: No symptoms reported     Quality of Family Relationships: involved, helpful, supportive      09/07/2023   10:30 AM  Depression screen PHQ 2/9  Decreased Interest 1  Down, Depressed, Hopeless 1  PHQ - 2 Score 2    There were no vitals filed for this visit.  Medications Reviewed Today     Reviewed by Kaylene Pascal, RN (Registered Nurse) on 10/06/23 at 1625  Med List Status: <None>   Medication Order Taking? Sig Documenting Provider Last Dose Status Informant  acetaminophen  (TYLENOL ) 500 MG tablet [id number] No Take 500 mg by mouth every 6 (six) hours as needed. [provider] Taking Active   albuterol  (VENTOLIN  HFA) 108 (90 Base) MCG/ACT inhaler [id number] No Inhale 2 puffs into the lungs every 4 (four) hours as needed for wheezing or shortness of breath. Austine Lefort, MD Taking Active   amLODipine  (NORVASC ) 10 MG tablet [id number]  Take 1 tablet (10 mg total) by mouth daily. Yolanda Hence S, FNP  Active   atorvastatin  (LIPITOR ) 80 MG tablet [id number] No TAKE 1 TABLET BY MOUTH EVERY NIGHT AT BEDTIME Austine Lefort, MD Taking Active   benzonatate  (TESSALON ) 100 MG capsule [id number] No Take 1 capsule (100 mg total) by  mouth every 8 (eight) hours.  Patient not taking: Reported on 09/07/2023   Jenelle Mis, FNP Not Taking Active   clopidogrel  (PLAVIX ) 75 MG tablet [id number] No TAKE 1 TABLET(75 MG)  BY MOUTH DAILY Nahser, Lela Purple, MD Taking Active   dapagliflozin  propanediol (FARXIGA ) 10 MG TABS tablet [id number] No Take 1 tablet (10 mg total) by mouth daily before breakfast. Austine Lefort, MD Taking Active   fluticasone  (FLONASE ) 50 MCG/ACT nasal spray [id number] No Place 2 sprays into both nostrils daily. Jenelle Mis, FNP Taking Active   furosemide  (LASIX ) 20 MG tablet [id number] No Take 1 tablet (20 mg total) by mouth every other day.  Patient not taking: Reported on 08/10/2023   Marlyse Single T, PA-C Not Taking Expired 01/06/23 2359   gabapentin  (NEURONTIN ) 800 MG tablet [id number] No Take 800 mg by mouth 3 (three) times daily. [provider] Taking Active Self, Pharmacy Records  losartan  (COZAAR ) 100 MG tablet [id number] No TAKE 1 TABLET(100 MG) BY MOUTH DAILY Nahser, Lela Purple, MD Taking Active   naloxone [facility]) nasal spray 4 mg/0.1 mL [id number] No Place 1 spray into the nose once. [provider] Taking Active   oxyCODONE -acetaminophen  (PERCOCET) 10-325 MG tablet [id number] No Take 1 tablet by mouth every 6 (six) hours as needed. [provider] Taking Active Self, Pharmacy Records  predniSONE  (DELTASONE ) 20 MG tablet [id number] No 3 tabs poqday 1-2, 2 tabs poqday 3-4, 1 tab poqday 5-6 Pickard, Warren T, MD Taking Active   REPATHA  SURECLICK 140 MG/ML [place] [id number]  ADMINISTER 1 ML UNDER THE SKIN EVERY 14 DAYS Nahser, Lela Purple, MD  Active   sodium bicarbonate  650 MG tablet [id number] No Take 1 tablet (650 mg total) by mouth daily. Colin Dawley, MD Taking Active             Recommendation:   PCP Follow-up  Follow Up Plan:   Telephone follow up appointment date/time:  Monday, July 14 at 10:30 AM  Louanne Roussel RN BSN CCM [place]  [facility], [facility] Health Nurse Care Coordinator  Direct Dial: [phone] Website: [email or web address]

## 2023-10-07 NOTE — Patient Instructions (Signed)
 Visit Information  Thank you for taking time to visit with me today. Please don't hesitate to contact me if I can be of assistance to you before our next scheduled appointment.  Your next care management appointment is by telephone on 11/09/23 at 10:30 AM  Please call the care guide team at [phone] if you need to cancel, schedule, or reschedule an appointment.   Please call [phone]-TALK (toll free, 24 hour hotline) if you are experiencing a Mental Health or Behavioral Health Crisis or need someone to talk to.  Louanne Roussel RN BSN CCM [place]  [facility], [facility] Health Nurse Care Coordinator  Direct Dial: [phone] Website: [email or web address]

## 2023-10-10 ENCOUNTER — Emergency Department (HOSPITAL_COMMUNITY)
Admission: EM | Admit: 2023-10-10 | Discharge: 2023-10-27 | Disposition: E | Attending: Emergency Medicine | Admitting: Emergency Medicine

## 2023-10-10 DIAGNOSIS — Z955 Presence of coronary angioplasty implant and graft: Secondary | ICD-10-CM | POA: Insufficient documentation

## 2023-10-10 DIAGNOSIS — Z7902 Long term (current) use of antithrombotics/antiplatelets: Secondary | ICD-10-CM | POA: Diagnosis not present

## 2023-10-10 DIAGNOSIS — S40811A Abrasion of right upper arm, initial encounter: Secondary | ICD-10-CM | POA: Diagnosis not present

## 2023-10-10 DIAGNOSIS — I469 Cardiac arrest, cause unspecified: Secondary | ICD-10-CM | POA: Diagnosis not present

## 2023-10-10 DIAGNOSIS — Z951 Presence of aortocoronary bypass graft: Secondary | ICD-10-CM | POA: Insufficient documentation

## 2023-10-10 DIAGNOSIS — S40812A Abrasion of left upper arm, initial encounter: Secondary | ICD-10-CM | POA: Diagnosis not present

## 2023-10-10 DIAGNOSIS — Y92410 Unspecified street and highway as the place of occurrence of the external cause: Secondary | ICD-10-CM | POA: Insufficient documentation

## 2023-10-10 DIAGNOSIS — I2510 Atherosclerotic heart disease of native coronary artery without angina pectoris: Secondary | ICD-10-CM | POA: Diagnosis not present

## 2023-10-10 DIAGNOSIS — R55 Syncope and collapse: Secondary | ICD-10-CM | POA: Diagnosis not present

## 2023-10-10 DIAGNOSIS — R404 Transient alteration of awareness: Secondary | ICD-10-CM | POA: Diagnosis not present

## 2023-10-10 DIAGNOSIS — I499 Cardiac arrhythmia, unspecified: Secondary | ICD-10-CM | POA: Diagnosis not present

## 2023-10-10 MED ORDER — EPINEPHRINE 1 MG/10ML IJ SOSY
PREFILLED_SYRINGE | INTRAMUSCULAR | Status: AC | PRN
  Administered 2023-10-10: 1 mg via INTRAVENOUS

## 2023-10-27 NOTE — ED Notes (Signed)
 Family members in the room, provided snacks and drinks for family.
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
## 2023-10-27 NOTE — Code Documentation (Signed)
 Asystole noted on the monitor. Compressions continued.

## 2023-10-27 NOTE — ED Notes (Signed)
 Daughter [name]) arrived to room.

## 2023-10-27 NOTE — ED Notes (Signed)
 Pt Belongings  Pt has a wallet with $36 in cash. $2.55 in coin. 1- set of keys 1 - shoe Cut clothing that pt was wearing.

## 2023-10-27 NOTE — ED Notes (Signed)
 Patient transported to morgue with two security attendants.

## 2023-10-27 NOTE — Code Documentation (Signed)
 Fine v-fib noted on pulse check. Pt defibrillated and compressions resumed

## 2023-10-27 NOTE — Code Documentation (Addendum)
 Patient time of death occurred at 32 called by Dr. Bryna Car, MD.   Asystole noted in leads II and III on the [facility] monitor.

## 2023-10-27 NOTE — Progress Notes (Signed)
   10/25/2023 1730  Spiritual Encounters  Type of Visit Initial  Care provided to: Family  Referral source Nurse (RN/NT/LPN)  Reason for visit Patient death  OnCall Visit Yes   Chaplain responded to a call for support of family due to the sudden death of the patient. Chaplain provided care in the form of listening and being a present as family came to see the patient and support one another. The family gathered and remembered the patient, [name], each in their own way, through story telling and family connections.   I stepped away as I saw that family was providing care to one another.   Clarence Croak [facility]  [phone].
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
## 2023-10-27 NOTE — ED Notes (Signed)
 Pt's family at bedside. Keys were released to [place]'s brother with permission from [place], pt's father.

## 2023-10-27 NOTE — ED Notes (Signed)
 Pt's daughter, Maan Zarcone, and pt's father, Jalin Alicea notified that patient had arrived to our hospital and is deceased.

## 2023-10-27 DEATH — deceased

## 2023-11-02 ENCOUNTER — Encounter (HOSPITAL_COMMUNITY)

## 2023-11-09 ENCOUNTER — Telehealth
